# Patient Record
Sex: Female | Born: 1935 | Race: White | Hispanic: No | Marital: Married | Smoking: Never smoker
Health system: Southern US, Community
[De-identification: ages and names within clinical notes are randomized; demographics above are authoritative.]

## PROBLEM LIST (undated history)

## (undated) DIAGNOSIS — G4700 Insomnia, unspecified: Secondary | ICD-10-CM

## (undated) DIAGNOSIS — E782 Mixed hyperlipidemia: Secondary | ICD-10-CM

## (undated) DIAGNOSIS — N184 Chronic kidney disease, stage 4 (severe): Secondary | ICD-10-CM

## (undated) DIAGNOSIS — K219 Gastro-esophageal reflux disease without esophagitis: Secondary | ICD-10-CM

## (undated) DIAGNOSIS — E119 Type 2 diabetes mellitus without complications: Secondary | ICD-10-CM

## (undated) DIAGNOSIS — I4891 Unspecified atrial fibrillation: Secondary | ICD-10-CM

## (undated) HISTORY — DX: Unspecified atrial fibrillation: I48.91

## (undated) HISTORY — PX: ESOPHAGEAL DILATION: SHX303

## (undated) HISTORY — DX: Chronic kidney disease, stage 4 (severe): N18.4

## (undated) HISTORY — DX: Mixed hyperlipidemia: E78.2

## (undated) HISTORY — DX: Insomnia, unspecified: G47.00

## (undated) HISTORY — DX: Gastro-esophageal reflux disease without esophagitis: K21.9

## (undated) HISTORY — DX: Type 2 diabetes mellitus without complications: E11.9

---

## 2001-02-24 ENCOUNTER — Encounter: Admission: RE | Admit: 2001-02-24 | Discharge: 2001-02-24 | Payer: Self-pay | Admitting: Unknown Physician Specialty

## 2001-02-24 ENCOUNTER — Encounter: Payer: Self-pay | Admitting: General Surgery

## 2001-03-24 ENCOUNTER — Encounter: Admission: RE | Admit: 2001-03-24 | Discharge: 2001-03-24 | Payer: Self-pay | Admitting: General Surgery

## 2001-03-24 ENCOUNTER — Encounter: Payer: Self-pay | Admitting: General Surgery

## 2001-03-25 ENCOUNTER — Encounter: Admission: RE | Admit: 2001-03-25 | Discharge: 2001-03-25 | Payer: Self-pay | Admitting: General Surgery

## 2001-03-25 ENCOUNTER — Encounter (INDEPENDENT_AMBULATORY_CARE_PROVIDER_SITE_OTHER): Payer: Self-pay | Admitting: Specialist

## 2001-03-25 ENCOUNTER — Encounter: Payer: Self-pay | Admitting: General Surgery

## 2001-03-25 ENCOUNTER — Ambulatory Visit (HOSPITAL_BASED_OUTPATIENT_CLINIC_OR_DEPARTMENT_OTHER): Admission: RE | Admit: 2001-03-25 | Discharge: 2001-03-25 | Payer: Self-pay | Admitting: General Surgery

## 2001-04-06 ENCOUNTER — Encounter: Payer: Self-pay | Admitting: General Surgery

## 2001-04-06 ENCOUNTER — Encounter: Admission: RE | Admit: 2001-04-06 | Discharge: 2001-04-06 | Payer: Self-pay | Admitting: General Surgery

## 2001-04-13 ENCOUNTER — Encounter: Admission: RE | Admit: 2001-04-13 | Discharge: 2001-07-12 | Payer: Self-pay | Admitting: Family Medicine

## 2002-03-28 ENCOUNTER — Encounter: Admission: RE | Admit: 2002-03-28 | Discharge: 2002-03-28 | Payer: Self-pay | Admitting: Family Medicine

## 2002-03-28 ENCOUNTER — Encounter: Payer: Self-pay | Admitting: Family Medicine

## 2003-06-13 ENCOUNTER — Encounter: Admission: RE | Admit: 2003-06-13 | Discharge: 2003-06-13 | Payer: Self-pay | Admitting: Family Medicine

## 2004-06-24 ENCOUNTER — Encounter: Admission: RE | Admit: 2004-06-24 | Discharge: 2004-06-24 | Payer: Self-pay | Admitting: Family Medicine

## 2005-06-27 ENCOUNTER — Encounter: Admission: RE | Admit: 2005-06-27 | Discharge: 2005-06-27 | Payer: Self-pay | Admitting: Family Medicine

## 2005-07-09 ENCOUNTER — Encounter: Admission: RE | Admit: 2005-07-09 | Discharge: 2005-07-09 | Payer: Self-pay | Admitting: Family Medicine

## 2006-01-28 ENCOUNTER — Encounter: Admission: RE | Admit: 2006-01-28 | Discharge: 2006-01-28 | Payer: Self-pay | Admitting: Gastroenterology

## 2006-02-20 ENCOUNTER — Ambulatory Visit (HOSPITAL_COMMUNITY): Admission: RE | Admit: 2006-02-20 | Discharge: 2006-02-20 | Payer: Self-pay | Admitting: Gastroenterology

## 2006-02-20 ENCOUNTER — Encounter (INDEPENDENT_AMBULATORY_CARE_PROVIDER_SITE_OTHER): Payer: Self-pay | Admitting: *Deleted

## 2006-06-30 ENCOUNTER — Encounter: Admission: RE | Admit: 2006-06-30 | Discharge: 2006-06-30 | Payer: Self-pay | Admitting: Family Medicine

## 2007-07-02 ENCOUNTER — Encounter: Admission: RE | Admit: 2007-07-02 | Discharge: 2007-07-02 | Payer: Self-pay | Admitting: Family Medicine

## 2008-07-03 ENCOUNTER — Encounter: Admission: RE | Admit: 2008-07-03 | Discharge: 2008-07-03 | Payer: Self-pay | Admitting: Family Medicine

## 2009-07-04 ENCOUNTER — Encounter: Admission: RE | Admit: 2009-07-04 | Discharge: 2009-07-04 | Payer: Self-pay | Admitting: Family Medicine

## 2010-06-01 ENCOUNTER — Other Ambulatory Visit: Payer: Self-pay | Admitting: Family Medicine

## 2010-06-01 DIAGNOSIS — Z1239 Encounter for other screening for malignant neoplasm of breast: Secondary | ICD-10-CM

## 2010-07-05 ENCOUNTER — Other Ambulatory Visit: Payer: Self-pay | Admitting: Family Medicine

## 2010-07-05 DIAGNOSIS — R229 Localized swelling, mass and lump, unspecified: Secondary | ICD-10-CM

## 2010-07-08 ENCOUNTER — Ambulatory Visit
Admission: RE | Admit: 2010-07-08 | Discharge: 2010-07-08 | Disposition: A | Discharge status: Home / Self Care | Payer: BC Managed Care – PPO | Source: Ambulatory Visit | Attending: Family Medicine | Admitting: Family Medicine

## 2010-07-08 DIAGNOSIS — Z1239 Encounter for other screening for malignant neoplasm of breast: Secondary | ICD-10-CM

## 2010-07-08 DIAGNOSIS — R229 Localized swelling, mass and lump, unspecified: Secondary | ICD-10-CM

## 2010-08-14 ENCOUNTER — Inpatient Hospital Stay (HOSPITAL_BASED_OUTPATIENT_CLINIC_OR_DEPARTMENT_OTHER)
Admission: RE | Admit: 2010-08-14 | Discharge: 2010-08-14 | Disposition: A | Discharge status: Home / Self Care | Payer: BC Managed Care – PPO | Source: Ambulatory Visit | Attending: Interventional Cardiology | Admitting: Interventional Cardiology

## 2010-08-14 ENCOUNTER — Ambulatory Visit
Admission: RE | Admit: 2010-08-14 | Payer: BC Managed Care – PPO | Source: Ambulatory Visit | Admitting: Interventional Cardiology

## 2010-08-14 DIAGNOSIS — R9439 Abnormal result of other cardiovascular function study: Secondary | ICD-10-CM | POA: Insufficient documentation

## 2010-09-27 NOTE — Op Note (Signed)
NAMEVENERA, Kathryn Richardson                ACCOUNT NO.:  [id number]   MEDICAL RECORD NO.:  [id number]          PATIENT TYPE:  AMB   LOCATION:  ENDO                         FACILITY:  [facility]   PHYSICIAN:  Wonda Horner, M.D.   DATE OF BIRTH:  April 20, 1935   DATE OF PROCEDURE:  02/23/2006  DATE OF DISCHARGE:  02/20/2006                                 OPERATIVE REPORT   PROCEDURE:  Esophagogastroduodenoscopy with biopsy and endoscopic balloon  dilatation of a distal esophageal stricture secondary to Schatzki's ring.   INDICATIONS FOR PROCEDURE:  Dysphagia, abnormal barium swallow showing  stenosis in the distal esophagus which after reviewing looks like there is a  Schatzki's ring.   The patient has been experiencing dysphagia primarily to solids.   Informed consent was obtained after explanation of the risks of bleeding,  infection, and perforation.   PREMEDICATION:  Fentanyl 75 mcg IV, Versed 7 mg IV.   PROCEDURE:  With the patient in the left lateral decubitus position, the  Olympus gastroscope was inserted into the oropharynx and passed into the  esophagus.  It was advanced down the esophagus to the level of the distal  esophagus. At this point there was a stenosis and it looked like this was  due to a Schatzki's ring. Also at the level of the Schatzki's ring and  extending upward just a little bit, there were two linear ulcerations. The  scope was easily passed into the stomach.  The stenosis was not preventing  passage of the scope. There was a hiatal hernia noted. The gastric mucosa  looked somewhat erythematous and biopsies were obtained for histology and to  look for evidence of Helicobacter pylori.  The duodenal bulb and second  portion of the duodenum looked normal. The scope was then brought back into  the stomach.  Retroflexion did not reveal any significant lesions.   The scope was then brought back to the level of the stenosis and an  endoscopic balloon dilator was advanced  down the scope and appropriately  placed at the level of the stenosis.  It was dilated to the 12 mm, then 13.5  mm. It was held in place at each level for 1 minute.  There was a small  amount of heme present in the area after the dilatation. She had a normal-  appearing mid and proximal esophagus.  She tolerated the procedure well  without immediate complications.   IMPRESSION:  1. Distal esophageal stenosis felt secondary to a Schatzki's ring.  2. Two linear ulcers in the distal esophagus at the level of the      Schatzki's ring.  3. Hiatal hernia.  4. Gastritis.   PLAN:  We will begin Prilosec 20 mg p.o. b.i.d.  The patient was instructed  to get this and start taking it. We will observe the response to the  dilatation. If symptoms of dysphagia are ongoing or recur, repeat dilatation  can be done.           ______________________________  Wonda Horner, M.D.     SFG/MEDQ  D:  02/23/2006  T:  02/23/2006  Job:  [id number]   cc:   [name], M.D.

## 2010-09-27 NOTE — Op Note (Signed)
[facility]  Patient:    Kathryn Richardson, Kathryn Richardson Visit Number: [id number] MRN: [id number]          Service Type: DSU Location: [facility] Attending Physician:  Luella Cook Dictated by:   Autumn Messing, M.D. Proc. Date: 03/25/01 Admit Date:  03/25/2001 Discharge Date: 03/25/2001                             Operative Report  PREOPERATIVE DIAGNOSIS:  Intraductal papilloma of the left breast.  POSTOPERATIVE DIAGNOSIS:  Intraductal papilloma of the left breast.  OPERATION PERFORMED:  Excisional biopsy of the left breast papilloma.  SURGEON:  Autumn Messing, M.D.  ANESTHESIA:  General endotracheal.  DESCRIPTION OF PROCEDURE:  After informed consent was obtained, the patient was brought to the operating room and placed in supine position on the operating table.  After adequate induction of general anesthesia, the patients left breast was prepped with Betadine and draped in the usual sterile manner.  Prior to coming to the operating room, a localizing wire had been placed by radiology through the area in question.  Once the breast was prepped and draped, a small curvilinear incision was made along the lateral edge of the nipple areolar complex.  The incision was carried down into the breast tissue using the Bovie electrocautery and dissection was carried out in a manner so as to excise a mass of breast tissue that included the tip of the wire.  Once this was complete and separated from the rest of the breast tissue, the specimen was removed and sent to radiology and pathology for further imaging and evaluation.  The wound was then inspected and several small bleeding points were coagulated until the wound was completely hemostatic.  The skin incision was then closed with running 4-0 Monocryl subcuticular stitch and benzoin and Steri-Strips and sterile dressings were applied.  The patient tolerated the procedure well.  At the end of the case all sponge, needle and  instrument counts were correct.  The patient was awakened and taken to the recovery room in stable condition. Dictated by:   Autumn Messing, M.D. Attending Physician:  Luella Cook DD:  04/12/01 TD:  04/12/01 Job: [id number]

## 2010-10-02 NOTE — Cardiovascular Report (Signed)
  Kathryn Richardson, Kathryn Richardson                ACCOUNT NO.:  [id number]  MEDICAL RECORD NO.:  [id number]           PATIENT TYPE:  O  LOCATION:  CATH                         FACILITY:  [facility]  PHYSICIAN:  Jettie Booze, MDDATE OF BIRTH:  May 23, 1934  DATE OF PROCEDURE:  08/14/2010 DATE OF DISCHARGE:                           CARDIAC CATHETERIZATION   REFERRING PHYSICIAN:  [name], MD  PROCEDURE PERFORMED:  Left heart catheterization, coronary angiogram.  OPERATOR:  Jettie Booze, MD  INDICATIONS:  Angina with abnormal stress test.  PROCEDURE NARRATIVE:  The risks and benefits of cardiac catheterization were explained to the patient.  Informed consent was obtained.  She was brought to the cath lab.  She was prepped and draped in usual sterile fashion.  Her right groin was infiltrated with 1% lidocaine.  A 4-French sheath was placed into the right common femoral artery using modified Seldinger technique.  The left coronary artery angiography was performed using a JL-4 pigtail catheter.  The catheter was advanced to vessel ostium under fluoroscopic guidance.  Digital angiography was performed in multiple projections using hand injection of contrast.  Right coronary artery angiography was performed in a similar fashion with a JR- 4 catheter.  A pigtail catheter was advanced to the ascending aorta and across the aortic valve under fluoroscopic guidance.  Hemodynamic pressure assessment was done.  A pullback while hemodynamics were measured was performed.  The sheath was removed using manual compression.  The patient was hydrated with IV bicarb because of chronic renal insufficiency.  Ventriculography was not performed to minimize the amount of dye.  The patient received 25 mL of contrast during the procedure.  FINDINGS:  Left main tract was widely patent. Left circumflex is a medium-sized vessel.  OM-1 was medium-sized.  The entire circumflex system was angiographically  normal. The ramus vessel was medium-sized and widely patent. Left anterior descending was a large vessel to the apex.  The D1 was a medium-sized vessel, widely patent.  The entire LAD system was angiographically normal. The right coronary artery was a large dominant vessel.  The posterolateral artery was a large vessel which supplied much of the lateral wall.  The posterior descending artery is a medium-sized vessel. The entire RCA system was angiographically normal.  HEMODYNAMICS:  Ventricular pressure 136/12 and LVEDP of 13 mmHg.  Aortic pressure 130/46 with a mean aortic pressure of 77 mmHg.  IMPRESSION: 1. No significant coronary artery disease. 2. No aortic valve gradient. 3. Normal left ventricular end diastolic pressure.  RECOMMENDATIONS:  Continue aggressive medical therapy.  Continue management of atrial fibrillation and preventive therapy.     Jettie Booze, MD     JSV/MEDQ  D:  08/14/2010  T:  08/14/2010  Job:  [id number]  Electronically Signed by Larae Grooms MD on 08/28/2010 02:22:14 PM

## 2011-02-09 DIAGNOSIS — E119 Type 2 diabetes mellitus without complications: Secondary | ICD-10-CM | POA: Insufficient documentation

## 2011-02-09 DIAGNOSIS — J309 Allergic rhinitis, unspecified: Secondary | ICD-10-CM | POA: Insufficient documentation

## 2011-02-09 DIAGNOSIS — E559 Vitamin D deficiency, unspecified: Secondary | ICD-10-CM | POA: Insufficient documentation

## 2011-05-15 ENCOUNTER — Other Ambulatory Visit: Payer: Self-pay | Admitting: Internal Medicine

## 2011-05-15 DIAGNOSIS — M109 Gout, unspecified: Secondary | ICD-10-CM | POA: Diagnosis not present

## 2011-05-15 DIAGNOSIS — Z79899 Other long term (current) drug therapy: Secondary | ICD-10-CM | POA: Diagnosis not present

## 2011-05-15 DIAGNOSIS — N184 Chronic kidney disease, stage 4 (severe): Secondary | ICD-10-CM | POA: Diagnosis not present

## 2011-05-15 DIAGNOSIS — M1990 Unspecified osteoarthritis, unspecified site: Secondary | ICD-10-CM | POA: Diagnosis not present

## 2011-05-21 DIAGNOSIS — Z7901 Long term (current) use of anticoagulants: Secondary | ICD-10-CM | POA: Diagnosis not present

## 2011-05-21 DIAGNOSIS — I4891 Unspecified atrial fibrillation: Secondary | ICD-10-CM | POA: Diagnosis not present

## 2011-05-26 DIAGNOSIS — N184 Chronic kidney disease, stage 4 (severe): Secondary | ICD-10-CM | POA: Diagnosis not present

## 2011-05-26 DIAGNOSIS — E559 Vitamin D deficiency, unspecified: Secondary | ICD-10-CM | POA: Diagnosis not present

## 2011-05-26 DIAGNOSIS — I10 Essential (primary) hypertension: Secondary | ICD-10-CM | POA: Diagnosis not present

## 2011-05-26 DIAGNOSIS — Z79899 Other long term (current) drug therapy: Secondary | ICD-10-CM | POA: Diagnosis not present

## 2011-05-26 DIAGNOSIS — I129 Hypertensive chronic kidney disease with stage 1 through stage 4 chronic kidney disease, or unspecified chronic kidney disease: Secondary | ICD-10-CM | POA: Diagnosis not present

## 2011-06-04 ENCOUNTER — Other Ambulatory Visit: Payer: Self-pay | Admitting: Family Medicine

## 2011-06-04 DIAGNOSIS — Z1231 Encounter for screening mammogram for malignant neoplasm of breast: Secondary | ICD-10-CM

## 2011-06-04 DIAGNOSIS — N189 Chronic kidney disease, unspecified: Secondary | ICD-10-CM | POA: Diagnosis not present

## 2011-06-11 DIAGNOSIS — E119 Type 2 diabetes mellitus without complications: Secondary | ICD-10-CM | POA: Diagnosis not present

## 2011-06-18 DIAGNOSIS — I4891 Unspecified atrial fibrillation: Secondary | ICD-10-CM | POA: Diagnosis not present

## 2011-06-18 DIAGNOSIS — Z7901 Long term (current) use of anticoagulants: Secondary | ICD-10-CM | POA: Diagnosis not present

## 2011-07-10 ENCOUNTER — Ambulatory Visit
Admission: RE | Admit: 2011-07-10 | Discharge: 2011-07-10 | Disposition: A | Discharge status: Home / Self Care | Payer: BC Managed Care – PPO | Source: Ambulatory Visit | Attending: Family Medicine | Admitting: Family Medicine

## 2011-07-10 DIAGNOSIS — Z1231 Encounter for screening mammogram for malignant neoplasm of breast: Secondary | ICD-10-CM

## 2011-07-15 DIAGNOSIS — Z7901 Long term (current) use of anticoagulants: Secondary | ICD-10-CM | POA: Diagnosis not present

## 2011-07-15 DIAGNOSIS — I4891 Unspecified atrial fibrillation: Secondary | ICD-10-CM | POA: Diagnosis not present

## 2011-08-12 DIAGNOSIS — Z7901 Long term (current) use of anticoagulants: Secondary | ICD-10-CM | POA: Diagnosis not present

## 2011-08-12 DIAGNOSIS — I4891 Unspecified atrial fibrillation: Secondary | ICD-10-CM | POA: Diagnosis not present

## 2011-09-03 ENCOUNTER — Encounter: Payer: Self-pay | Admitting: Internal Medicine

## 2011-09-03 ENCOUNTER — Ambulatory Visit (INDEPENDENT_AMBULATORY_CARE_PROVIDER_SITE_OTHER): Payer: BC Managed Care – PPO | Admitting: Internal Medicine

## 2011-09-03 VITALS — BP 142/58 | HR 51 | Resp 19 | Ht 63.0 in | Wt 133.0 lb

## 2011-09-03 DIAGNOSIS — I4891 Unspecified atrial fibrillation: Secondary | ICD-10-CM | POA: Insufficient documentation

## 2011-09-03 DIAGNOSIS — R001 Bradycardia, unspecified: Secondary | ICD-10-CM | POA: Insufficient documentation

## 2011-09-03 DIAGNOSIS — I498 Other specified cardiac arrhythmias: Secondary | ICD-10-CM

## 2011-09-03 NOTE — Assessment & Plan Note (Signed)
She has paroxysmal atrial fibrillation well controlled with flecainide.  Given renal failure and bradycardia, she really does not have other antiarrhythmic options presently.  I will try to wean her off of flecainide, possibly using this as a pill in pocket medicine.  If her afib worsens, then I would anticipate proceeding with PPM which would open up our antiarrhythmic options further.  She is not presently interested in catheter ablation and understands that 50cc of contrast required would potentially worsen her renal failure. She is appropriately anticoagulated with coumadin.

## 2011-09-03 NOTE — Patient Instructions (Signed)
Your physician recommends that you schedule a follow-up appointment in: 5 weeks with Dr Rayann Heman  Your physician has recommended you make the following change in your medication:  1) Decrease Flecainide to 50mg  twice daily for 1 week then stop

## 2011-09-03 NOTE — Assessment & Plan Note (Signed)
The patient presents today for EP consultation regarding symptomatic bradycardia.  It is possible that her bradycardia is related to flecainide.  As her afib has been well controlled, I would like to see how she does off of flecainide.  If her afib returns or she does not have significant improvement in heart rates, then our next option would likely be pacemaker implantation.  Given her chronic stage IV renal failure, I would like to avoid PPM if possible, though I suspect that implantation is inevitable.

## 2011-09-03 NOTE — Progress Notes (Signed)
Primary Care Physician: Gara Kroner, MD, MD Referring Physician:  Dr Saunders Kathryn Richardson is a 77 y.o. female with a h/o paroxysmal atrial fibrillation and bradycardia who presents today for EP consultation.   She was diagnosed with atrial fibrillation 4/10 after presenting with tachypalpitations.  In retrospect, she feels that she had afib for several months prior.  She was place on coumadin at that time.  She had increased afib and was placed on flecainide 2011.  She feels that flecainide has controlled her afib.  Her last episode was 02/2010.  She has done well with flecainide but has noticed bradycardia with this.  She feels that she has had bradycardia since 2011.  She attributes this to flecainide. She reports that her heart rates are always 40s, but recently 30s at times.  When her heart rate is slow, she reports feeling "worn out" and frequently "dizzy".  She describes this dizziness as waves of lightheadedness.  This occurs when walking at times.  She has associated fatigue.  She is short of breath with moderate activity.  She has been demonstrated to have a blunted heart rate response to activity on treadmill previously.  Today, she denies symptoms of palpitations, chest pain, shortness of breath, orthopnea, PND, lower extremity edema, dizziness, presyncope, syncope, or neurologic sequela. The patient is tolerating medications without difficulties and is otherwise without complaint today.   Past Medical History  Diagnosis Date  . Essential hypertension   . Gout   . GERD (gastroesophageal reflux disease)   . Insomnia   . Atrial fibrillation   . Type 2 diabetes mellitus   . Allergic rhinitis   . Hyperlipemia, mixed   . Chronic renal disease, stage IV    Past Surgical History  Procedure Date  . Cesarean section     x2  . Esophageal dilation     Current Outpatient Prescriptions  Medication Sig Dispense Refill  . amLODipine (NORVASC) 10 MG tablet Take 10 mg by mouth daily.       Marland Kitchen aspirin 81 MG tablet Take 81 mg by mouth daily.      Marland Kitchen atorvastatin (LIPITOR) 80 MG tablet Take 80 mg by mouth daily.      . benazepril (LOTENSIN) 20 MG tablet Take 20 mg by mouth daily.      . febuxostat (ULORIC) 40 MG tablet Take 40 mg by mouth daily.      . flecainide (TAMBOCOR) 100 MG tablet Take 100 mg by mouth 2 (two) times daily.      . furosemide (LASIX) 40 MG tablet Take 40 mg by mouth daily.      Marland Kitchen levothyroxine (SYNTHROID, LEVOTHROID) 50 MCG tablet Take 50 mcg by mouth daily. On a empty stomach.      . Potassium Chloride Crys CR (KLOR-CON M20 PO) Take 20 mEq by mouth daily.      . sitaGLIPtin (JANUVIA) 100 MG tablet Take 100 mg by mouth daily.      . Vitamin D, Ergocalciferol, (DRISDOL) 50000 UNITS CAPS Take 50,000 Units by mouth.      . warfarin (COUMADIN) 5 MG tablet Take 5 mg by mouth daily. 2.5 mg by mouth daily;except 5 mg on MWF.        Allergies  Allergen Reactions  . Prednisone     Increase blood sugar.    History   Social History  . Marital Status: Married    Spouse Name: N/A    Number of Children: N/A  . Years of  Education: N/A   Occupational History  . Not on file.   Social History Main Topics  . Smoking status: Never Smoker   . Smokeless tobacco: Not on file  . Alcohol Use: No  . Drug Use: No  . Sexually Active: Not on file   Other Topics Concern  . Not on file   Social History Narrative   Lives in [place].  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 Coronary artery disease Father 32    ROS- All systems are reviewed and negative except as per the HPI above  Physical Exam: Filed Vitals:   09/03/11 1415  BP: 142/58  Pulse: 51  Resp: 19  Height: 5\' 3"  (1.6 m)  Weight: 133 lb (60.328 kg)    GEN- The patient is well appearing, alert and oriented x 3 today.   Head- normocephalic, atraumatic Eyes-  Sclera clear, conjunctiva pink Ears- hearing intact Oropharynx-  clear Neck- supple, no JVP Lymph- no cervical lymphadenopathy Lungs- Clear to ausculation bilaterally, normal work of breathing Heart- Regular rate and rhythm, no murmurs, rubs or gallops, PMI not laterally displaced GI- soft, NT, ND, + BS Extremities- no clubbing, cyanosis, or edema MS- no significant deformity or atrophy Skin- no rash or lesion Psych- euthymic mood, full affect Neuro- strength and sensation are intact  EKG today reveals sinus bradycardia 51 bpm, PR 254, IVCD (QRS 122), Qtc 457  Assessment and Plan:

## 2011-09-04 ENCOUNTER — Encounter: Payer: Self-pay | Admitting: Internal Medicine

## 2011-09-08 DIAGNOSIS — R809 Proteinuria, unspecified: Secondary | ICD-10-CM | POA: Diagnosis not present

## 2011-09-08 DIAGNOSIS — E119 Type 2 diabetes mellitus without complications: Secondary | ICD-10-CM | POA: Diagnosis not present

## 2011-09-08 DIAGNOSIS — M109 Gout, unspecified: Secondary | ICD-10-CM | POA: Diagnosis not present

## 2011-09-08 DIAGNOSIS — D631 Anemia in chronic kidney disease: Secondary | ICD-10-CM | POA: Diagnosis not present

## 2011-09-08 DIAGNOSIS — E559 Vitamin D deficiency, unspecified: Secondary | ICD-10-CM | POA: Diagnosis not present

## 2011-09-08 DIAGNOSIS — N184 Chronic kidney disease, stage 4 (severe): Secondary | ICD-10-CM | POA: Diagnosis not present

## 2011-09-08 DIAGNOSIS — I129 Hypertensive chronic kidney disease with stage 1 through stage 4 chronic kidney disease, or unspecified chronic kidney disease: Secondary | ICD-10-CM | POA: Diagnosis not present

## 2011-09-17 DIAGNOSIS — I4891 Unspecified atrial fibrillation: Secondary | ICD-10-CM | POA: Diagnosis not present

## 2011-09-17 DIAGNOSIS — Z7901 Long term (current) use of anticoagulants: Secondary | ICD-10-CM | POA: Diagnosis not present

## 2011-10-13 ENCOUNTER — Encounter: Payer: Self-pay | Admitting: Internal Medicine

## 2011-10-13 ENCOUNTER — Other Ambulatory Visit: Payer: Self-pay | Admitting: Cardiology

## 2011-10-13 ENCOUNTER — Ambulatory Visit (INDEPENDENT_AMBULATORY_CARE_PROVIDER_SITE_OTHER): Payer: BC Managed Care – PPO | Admitting: Internal Medicine

## 2011-10-13 VITALS — BP 114/66 | HR 63 | Ht 65.0 in | Wt 176.8 lb

## 2011-10-13 DIAGNOSIS — I498 Other specified cardiac arrhythmias: Secondary | ICD-10-CM | POA: Diagnosis not present

## 2011-10-13 DIAGNOSIS — R001 Bradycardia, unspecified: Secondary | ICD-10-CM

## 2011-10-13 DIAGNOSIS — I4891 Unspecified atrial fibrillation: Secondary | ICD-10-CM | POA: Diagnosis not present

## 2011-10-13 NOTE — Patient Instructions (Signed)
Your physician recommends that you schedule a follow-up appointment as needed  

## 2011-10-16 ENCOUNTER — Encounter: Payer: Self-pay | Admitting: Internal Medicine

## 2011-10-16 NOTE — Assessment & Plan Note (Signed)
Presently doing well on flecainide 50mg  BID.  She did not tolerate lower doses.  Presently, she is maintaining sinus rhythm and feels no significant symptoms with bradycardia. I therefore think that we should continue this current strategy long term.  Given renal failure and bradycardia, she really does not have other antiarrhythmic options presently.   If her afib worsens, then I would anticipate proceeding with PPM which would open up our antiarrhythmic options further.  She is not presently interested in catheter ablation and understands that 50cc of contrast required would potentially worsen her renal failure.   She is appropriately anticoagulated with coumadin.  At this point, I will return his care to Dr Irish Lack.  If she has further problems then I am happy to see her again at that time.

## 2011-10-16 NOTE — Assessment & Plan Note (Signed)
Presently stable and minimally symptomatic As she has significant renal failure and may eventually require HD, we should avoid PPM if able.

## 2011-10-16 NOTE — Progress Notes (Signed)
Primary Care Physician: Gara Kroner, MD, MD Referring Physician:  Dr Saunders Revel is a 77 y.o. female with a h/o paroxysmal atrial fibrillation and bradycardia who presents today for EP follow-up.  She has done reasonably well since I saw her last.  She initially stopped flecainide as per my recommendation but had recurrent afib.  She therefore restarted a lower dose of flecainide as per Dr Hassell Done recommendation.  She has done very well since that time.  She feels that her energy is stable.  She presently denies dizziness or symptoms of bradycardia.  She feels that her afib is controlled.  Today, she denies symptoms of palpitations, chest pain, shortness of breath, orthopnea, PND, lower extremity edema, presyncope, syncope, or neurologic sequela. The patient is tolerating medications without difficulties and is otherwise without complaint today.   Past Medical History  Diagnosis Date  . Essential hypertension   . Gout   . GERD (gastroesophageal reflux disease)   . Insomnia   . Atrial fibrillation   . Type 2 diabetes mellitus   . Allergic rhinitis   . Hyperlipemia, mixed   . Chronic renal disease, stage IV    Past Surgical History  Procedure Date  . Cesarean section     x2  . Esophageal dilation     Current Outpatient Prescriptions  Medication Sig Dispense Refill  . amLODipine (NORVASC) 10 MG tablet Take 10 mg by mouth daily.      Marland Kitchen aspirin 81 MG tablet Take 81 mg by mouth daily.      Marland Kitchen atorvastatin (LIPITOR) 80 MG tablet Take 80 mg by mouth daily.      . benazepril (LOTENSIN) 20 MG tablet Take 20 mg by mouth daily.      . febuxostat (ULORIC) 40 MG tablet Take 40 mg by mouth daily.      . flecainide (TAMBOCOR) 50 MG tablet Take 50 mg by mouth 2 (two) times daily.      . furosemide (LASIX) 40 MG tablet Take 40 mg by mouth daily.      Marland Kitchen levothyroxine (SYNTHROID, LEVOTHROID) 50 MCG tablet Take 50 mcg by mouth daily. On a empty stomach.      . Potassium Chloride  Crys CR (KLOR-CON M20 PO) Take 20 mEq by mouth daily.      . sitaGLIPtin (JANUVIA) 100 MG tablet Take 100 mg by mouth daily.      Marland Kitchen warfarin (COUMADIN) 5 MG tablet Take 5 mg by mouth daily. 2.5 mg by mouth daily;except 5 mg on MWF.        Allergies  Allergen Reactions  . Prednisone     Increase blood sugar.    History   Social History  . Marital Status: Married    Spouse Name: N/A    Number of Children: N/A  . Years of Education: N/A   Occupational History  . Not on file.   Social History Main Topics  . Smoking status: Never Smoker   . Smokeless tobacco: Not on file  . Alcohol Use: No  . Drug Use: No  . Sexually Active: Not on file   Other Topics Concern  . Not on file   Social History Narrative   Lives in [place].  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 Coronary artery disease Father 75   Physical Exam: Filed Vitals:   10/13/11 1510  BP: 114/66  Pulse: 63  Height:  5\' 5"  (1.651 m)  Weight: 176 lb 12.8 oz (80.196 kg)    GEN- The patient is well appearing, alert and oriented x 3 today.   Head- normocephalic, atraumatic Eyes-  Sclera clear, conjunctiva pink Ears- hearing intact Oropharynx- clear Neck- supple, no JVP Lymph- no cervical lymphadenopathy Lungs- Clear to ausculation bilaterally, normal work of breathing Heart- Regular rate and rhythm, no murmurs, rubs or gallops, PMI not laterally displaced GI- soft, NT, ND, + BS Extremities- no clubbing, cyanosis, or edema   EKG today reveals sinus rhythm 63 bpm, PR 190, QRS 82 msec, Qtc 423 Event monitor 09/04/11-09/25/11 is reviewed in detail today,  This reveals sinus bradycardia with PACs and PVCs   Assessment and Plan:

## 2011-10-21 DIAGNOSIS — I4891 Unspecified atrial fibrillation: Secondary | ICD-10-CM | POA: Diagnosis not present

## 2011-10-21 DIAGNOSIS — Z7901 Long term (current) use of anticoagulants: Secondary | ICD-10-CM | POA: Diagnosis not present

## 2011-10-28 DIAGNOSIS — E119 Type 2 diabetes mellitus without complications: Secondary | ICD-10-CM | POA: Diagnosis not present

## 2011-10-28 DIAGNOSIS — R609 Edema, unspecified: Secondary | ICD-10-CM | POA: Diagnosis not present

## 2011-10-28 DIAGNOSIS — M109 Gout, unspecified: Secondary | ICD-10-CM | POA: Diagnosis not present

## 2011-10-28 DIAGNOSIS — E559 Vitamin D deficiency, unspecified: Secondary | ICD-10-CM | POA: Diagnosis not present

## 2011-10-28 DIAGNOSIS — I10 Essential (primary) hypertension: Secondary | ICD-10-CM | POA: Diagnosis not present

## 2011-10-28 DIAGNOSIS — E039 Hypothyroidism, unspecified: Secondary | ICD-10-CM | POA: Diagnosis not present

## 2011-10-28 DIAGNOSIS — N184 Chronic kidney disease, stage 4 (severe): Secondary | ICD-10-CM | POA: Diagnosis not present

## 2011-10-28 DIAGNOSIS — E782 Mixed hyperlipidemia: Secondary | ICD-10-CM | POA: Diagnosis not present

## 2011-12-09 DIAGNOSIS — I4891 Unspecified atrial fibrillation: Secondary | ICD-10-CM | POA: Diagnosis not present

## 2011-12-09 DIAGNOSIS — Z7901 Long term (current) use of anticoagulants: Secondary | ICD-10-CM | POA: Diagnosis not present

## 2011-12-23 DIAGNOSIS — I4891 Unspecified atrial fibrillation: Secondary | ICD-10-CM | POA: Diagnosis not present

## 2011-12-23 DIAGNOSIS — Z7901 Long term (current) use of anticoagulants: Secondary | ICD-10-CM | POA: Diagnosis not present

## 2011-12-31 DIAGNOSIS — I10 Essential (primary) hypertension: Secondary | ICD-10-CM | POA: Diagnosis not present

## 2011-12-31 DIAGNOSIS — I129 Hypertensive chronic kidney disease with stage 1 through stage 4 chronic kidney disease, or unspecified chronic kidney disease: Secondary | ICD-10-CM | POA: Diagnosis not present

## 2011-12-31 DIAGNOSIS — M549 Dorsalgia, unspecified: Secondary | ICD-10-CM | POA: Diagnosis not present

## 2011-12-31 DIAGNOSIS — N184 Chronic kidney disease, stage 4 (severe): Secondary | ICD-10-CM | POA: Diagnosis not present

## 2011-12-31 DIAGNOSIS — I4891 Unspecified atrial fibrillation: Secondary | ICD-10-CM | POA: Diagnosis not present

## 2011-12-31 DIAGNOSIS — G8929 Other chronic pain: Secondary | ICD-10-CM | POA: Diagnosis not present

## 2012-01-29 DIAGNOSIS — Z7901 Long term (current) use of anticoagulants: Secondary | ICD-10-CM | POA: Diagnosis not present

## 2012-01-29 DIAGNOSIS — I4891 Unspecified atrial fibrillation: Secondary | ICD-10-CM | POA: Diagnosis not present

## 2012-02-16 DIAGNOSIS — J019 Acute sinusitis, unspecified: Secondary | ICD-10-CM | POA: Diagnosis not present

## 2012-02-25 ENCOUNTER — Other Ambulatory Visit: Payer: Self-pay | Admitting: Family Medicine

## 2012-02-25 DIAGNOSIS — M79604 Pain in right leg: Secondary | ICD-10-CM

## 2012-02-25 DIAGNOSIS — Z23 Encounter for immunization: Secondary | ICD-10-CM | POA: Diagnosis not present

## 2012-02-25 DIAGNOSIS — M25579 Pain in unspecified ankle and joints of unspecified foot: Secondary | ICD-10-CM | POA: Diagnosis not present

## 2012-02-26 ENCOUNTER — Ambulatory Visit
Admission: RE | Admit: 2012-02-26 | Discharge: 2012-02-26 | Disposition: A | Discharge status: Home / Self Care | Payer: BC Managed Care – PPO | Source: Ambulatory Visit | Attending: Family Medicine | Admitting: Family Medicine

## 2012-02-26 ENCOUNTER — Other Ambulatory Visit: Payer: Self-pay | Admitting: Family Medicine

## 2012-02-26 DIAGNOSIS — M7989 Other specified soft tissue disorders: Secondary | ICD-10-CM | POA: Diagnosis not present

## 2012-02-26 DIAGNOSIS — M79604 Pain in right leg: Secondary | ICD-10-CM

## 2012-02-26 DIAGNOSIS — M79609 Pain in unspecified limb: Secondary | ICD-10-CM | POA: Diagnosis not present

## 2012-03-01 DIAGNOSIS — M109 Gout, unspecified: Secondary | ICD-10-CM | POA: Diagnosis not present

## 2012-03-01 DIAGNOSIS — Z79899 Other long term (current) drug therapy: Secondary | ICD-10-CM | POA: Diagnosis not present

## 2012-03-01 DIAGNOSIS — M25569 Pain in unspecified knee: Secondary | ICD-10-CM | POA: Diagnosis not present

## 2012-03-23 DIAGNOSIS — I4891 Unspecified atrial fibrillation: Secondary | ICD-10-CM | POA: Diagnosis not present

## 2012-03-23 DIAGNOSIS — I10 Essential (primary) hypertension: Secondary | ICD-10-CM | POA: Diagnosis not present

## 2012-03-29 DIAGNOSIS — I4891 Unspecified atrial fibrillation: Secondary | ICD-10-CM | POA: Diagnosis not present

## 2012-03-29 DIAGNOSIS — Z7901 Long term (current) use of anticoagulants: Secondary | ICD-10-CM | POA: Diagnosis not present

## 2012-04-26 DIAGNOSIS — Z7901 Long term (current) use of anticoagulants: Secondary | ICD-10-CM | POA: Diagnosis not present

## 2012-04-26 DIAGNOSIS — I4891 Unspecified atrial fibrillation: Secondary | ICD-10-CM | POA: Diagnosis not present

## 2012-05-24 DIAGNOSIS — I4891 Unspecified atrial fibrillation: Secondary | ICD-10-CM | POA: Diagnosis not present

## 2012-05-24 DIAGNOSIS — Z7901 Long term (current) use of anticoagulants: Secondary | ICD-10-CM | POA: Diagnosis not present

## 2012-06-01 DIAGNOSIS — N184 Chronic kidney disease, stage 4 (severe): Secondary | ICD-10-CM | POA: Diagnosis not present

## 2012-06-01 DIAGNOSIS — E559 Vitamin D deficiency, unspecified: Secondary | ICD-10-CM | POA: Diagnosis not present

## 2012-06-01 DIAGNOSIS — I10 Essential (primary) hypertension: Secondary | ICD-10-CM | POA: Diagnosis not present

## 2012-06-01 DIAGNOSIS — E119 Type 2 diabetes mellitus without complications: Secondary | ICD-10-CM | POA: Diagnosis not present

## 2012-06-01 DIAGNOSIS — E039 Hypothyroidism, unspecified: Secondary | ICD-10-CM | POA: Diagnosis not present

## 2012-06-01 DIAGNOSIS — M109 Gout, unspecified: Secondary | ICD-10-CM | POA: Diagnosis not present

## 2012-06-01 DIAGNOSIS — R609 Edema, unspecified: Secondary | ICD-10-CM | POA: Diagnosis not present

## 2012-06-01 DIAGNOSIS — E782 Mixed hyperlipidemia: Secondary | ICD-10-CM | POA: Diagnosis not present

## 2012-06-21 DIAGNOSIS — I4891 Unspecified atrial fibrillation: Secondary | ICD-10-CM | POA: Diagnosis not present

## 2012-06-21 DIAGNOSIS — Z7901 Long term (current) use of anticoagulants: Secondary | ICD-10-CM | POA: Diagnosis not present

## 2012-06-22 ENCOUNTER — Other Ambulatory Visit: Payer: Self-pay | Admitting: Family Medicine

## 2012-06-22 DIAGNOSIS — Z9889 Other specified postprocedural states: Secondary | ICD-10-CM

## 2012-06-22 DIAGNOSIS — Z1231 Encounter for screening mammogram for malignant neoplasm of breast: Secondary | ICD-10-CM

## 2012-07-20 DIAGNOSIS — I4891 Unspecified atrial fibrillation: Secondary | ICD-10-CM | POA: Diagnosis not present

## 2012-07-20 DIAGNOSIS — Z7901 Long term (current) use of anticoagulants: Secondary | ICD-10-CM | POA: Diagnosis not present

## 2012-07-22 ENCOUNTER — Ambulatory Visit
Admission: RE | Admit: 2012-07-22 | Discharge: 2012-07-22 | Disposition: A | Discharge status: Home / Self Care | Payer: BC Managed Care – PPO | Source: Ambulatory Visit | Attending: Family Medicine | Admitting: Family Medicine

## 2012-07-22 DIAGNOSIS — Z1231 Encounter for screening mammogram for malignant neoplasm of breast: Secondary | ICD-10-CM

## 2012-07-22 DIAGNOSIS — Z9889 Other specified postprocedural states: Secondary | ICD-10-CM

## 2012-08-19 DIAGNOSIS — N039 Chronic nephritic syndrome with unspecified morphologic changes: Secondary | ICD-10-CM | POA: Diagnosis not present

## 2012-08-19 DIAGNOSIS — Z7901 Long term (current) use of anticoagulants: Secondary | ICD-10-CM | POA: Diagnosis not present

## 2012-08-19 DIAGNOSIS — Z7982 Long term (current) use of aspirin: Secondary | ICD-10-CM | POA: Diagnosis not present

## 2012-08-19 DIAGNOSIS — D631 Anemia in chronic kidney disease: Secondary | ICD-10-CM | POA: Diagnosis not present

## 2012-08-19 DIAGNOSIS — Z79899 Other long term (current) drug therapy: Secondary | ICD-10-CM | POA: Diagnosis not present

## 2012-08-19 DIAGNOSIS — E119 Type 2 diabetes mellitus without complications: Secondary | ICD-10-CM | POA: Diagnosis not present

## 2012-08-19 DIAGNOSIS — I129 Hypertensive chronic kidney disease with stage 1 through stage 4 chronic kidney disease, or unspecified chronic kidney disease: Secondary | ICD-10-CM | POA: Diagnosis not present

## 2012-08-19 DIAGNOSIS — N184 Chronic kidney disease, stage 4 (severe): Secondary | ICD-10-CM | POA: Diagnosis not present

## 2012-08-24 DIAGNOSIS — Z7901 Long term (current) use of anticoagulants: Secondary | ICD-10-CM | POA: Diagnosis not present

## 2012-08-24 DIAGNOSIS — I4891 Unspecified atrial fibrillation: Secondary | ICD-10-CM | POA: Diagnosis not present

## 2012-08-30 DIAGNOSIS — Z79899 Other long term (current) drug therapy: Secondary | ICD-10-CM | POA: Diagnosis not present

## 2012-08-30 DIAGNOSIS — M109 Gout, unspecified: Secondary | ICD-10-CM | POA: Diagnosis not present

## 2012-09-21 DIAGNOSIS — E782 Mixed hyperlipidemia: Secondary | ICD-10-CM | POA: Diagnosis not present

## 2012-09-21 DIAGNOSIS — I4891 Unspecified atrial fibrillation: Secondary | ICD-10-CM | POA: Diagnosis not present

## 2012-09-21 DIAGNOSIS — Z7901 Long term (current) use of anticoagulants: Secondary | ICD-10-CM | POA: Diagnosis not present

## 2012-09-21 DIAGNOSIS — I10 Essential (primary) hypertension: Secondary | ICD-10-CM | POA: Diagnosis not present

## 2012-11-09 DIAGNOSIS — I4891 Unspecified atrial fibrillation: Secondary | ICD-10-CM | POA: Diagnosis not present

## 2012-11-09 DIAGNOSIS — Z7901 Long term (current) use of anticoagulants: Secondary | ICD-10-CM | POA: Diagnosis not present

## 2012-12-21 DIAGNOSIS — Z7901 Long term (current) use of anticoagulants: Secondary | ICD-10-CM | POA: Diagnosis not present

## 2012-12-21 DIAGNOSIS — I4891 Unspecified atrial fibrillation: Secondary | ICD-10-CM | POA: Diagnosis not present

## 2013-01-26 DIAGNOSIS — Z7901 Long term (current) use of anticoagulants: Secondary | ICD-10-CM | POA: Diagnosis not present

## 2013-01-26 DIAGNOSIS — I4891 Unspecified atrial fibrillation: Secondary | ICD-10-CM | POA: Diagnosis not present

## 2013-02-20 ENCOUNTER — Other Ambulatory Visit: Payer: Self-pay | Admitting: Interventional Cardiology

## 2013-02-21 DIAGNOSIS — N184 Chronic kidney disease, stage 4 (severe): Secondary | ICD-10-CM | POA: Diagnosis not present

## 2013-02-21 DIAGNOSIS — I129 Hypertensive chronic kidney disease with stage 1 through stage 4 chronic kidney disease, or unspecified chronic kidney disease: Secondary | ICD-10-CM | POA: Diagnosis not present

## 2013-02-21 DIAGNOSIS — E119 Type 2 diabetes mellitus without complications: Secondary | ICD-10-CM | POA: Diagnosis not present

## 2013-02-21 DIAGNOSIS — Z23 Encounter for immunization: Secondary | ICD-10-CM | POA: Diagnosis not present

## 2013-02-21 DIAGNOSIS — G4700 Insomnia, unspecified: Secondary | ICD-10-CM | POA: Diagnosis not present

## 2013-02-24 ENCOUNTER — Ambulatory Visit (INDEPENDENT_AMBULATORY_CARE_PROVIDER_SITE_OTHER): Payer: Medicare Other | Admitting: Pharmacist

## 2013-02-24 DIAGNOSIS — I4891 Unspecified atrial fibrillation: Secondary | ICD-10-CM | POA: Diagnosis not present

## 2013-02-24 LAB — POCT INR: INR: 2.8

## 2013-03-14 DIAGNOSIS — E039 Hypothyroidism, unspecified: Secondary | ICD-10-CM | POA: Diagnosis not present

## 2013-03-14 DIAGNOSIS — E785 Hyperlipidemia, unspecified: Secondary | ICD-10-CM | POA: Diagnosis not present

## 2013-03-14 DIAGNOSIS — E559 Vitamin D deficiency, unspecified: Secondary | ICD-10-CM | POA: Diagnosis not present

## 2013-03-14 DIAGNOSIS — M109 Gout, unspecified: Secondary | ICD-10-CM | POA: Diagnosis not present

## 2013-03-14 DIAGNOSIS — N184 Chronic kidney disease, stage 4 (severe): Secondary | ICD-10-CM | POA: Diagnosis not present

## 2013-03-14 DIAGNOSIS — E1129 Type 2 diabetes mellitus with other diabetic kidney complication: Secondary | ICD-10-CM | POA: Diagnosis not present

## 2013-03-14 DIAGNOSIS — R609 Edema, unspecified: Secondary | ICD-10-CM | POA: Diagnosis not present

## 2013-03-14 DIAGNOSIS — I129 Hypertensive chronic kidney disease with stage 1 through stage 4 chronic kidney disease, or unspecified chronic kidney disease: Secondary | ICD-10-CM | POA: Diagnosis not present

## 2013-03-25 ENCOUNTER — Encounter: Payer: Self-pay | Admitting: Interventional Cardiology

## 2013-03-25 ENCOUNTER — Ambulatory Visit (INDEPENDENT_AMBULATORY_CARE_PROVIDER_SITE_OTHER): Payer: Medicare Other | Admitting: Interventional Cardiology

## 2013-03-25 ENCOUNTER — Ambulatory Visit (INDEPENDENT_AMBULATORY_CARE_PROVIDER_SITE_OTHER): Payer: Medicare Other | Admitting: *Deleted

## 2013-03-25 VITALS — BP 140/72 | HR 64 | Ht 61.0 in | Wt 171.0 lb

## 2013-03-25 DIAGNOSIS — I10 Essential (primary) hypertension: Secondary | ICD-10-CM

## 2013-03-25 DIAGNOSIS — I4891 Unspecified atrial fibrillation: Secondary | ICD-10-CM

## 2013-03-25 DIAGNOSIS — E782 Mixed hyperlipidemia: Secondary | ICD-10-CM | POA: Diagnosis not present

## 2013-03-25 DIAGNOSIS — Z7901 Long term (current) use of anticoagulants: Secondary | ICD-10-CM | POA: Diagnosis not present

## 2013-03-25 DIAGNOSIS — E785 Hyperlipidemia, unspecified: Secondary | ICD-10-CM | POA: Insufficient documentation

## 2013-03-25 NOTE — Progress Notes (Signed)
Patient ID: Kathryn Richardson, female   DOB: [birth year], 78 y.o.   MRN: [id number]    [address], [place] [place], [place]  [address] Phone: [phone] Fax:  [phone]  Date:  03/25/2013   ID:  SHERECE HOPPER, DOB [birth year], MRN [id number]  PCP:  Gara Kroner, MD      History of Present Illness: Kathryn Richardson is a 78 y.o. female who has had PAF. She has not had any syncope. Occasional lightheadedness. Occasional palpitations. She takes an extra flecainide if she has sx, per Dr. Rayann Heman. Her HR is typically in the 50-60 range. BP controlled at home. [id number] systolic. Hypertension:  Denies : Chest pain.  Dizziness.  Leg edema.  Orthopnea.  Syncope.  she does not exercise much. She does not go out to exercise.  She has had HR in the 40s at home with high BP at that time.  BP can be high when she is in AFib.  Typically, BP is well controlled.     Wt Readings from Last 3 Encounters:  03/25/13 171 lb (77.565 kg)  10/13/11 176 lb 12.8 oz (80.196 kg)  09/03/11 133 lb (60.328 kg)     Past Medical History  Diagnosis Date  . Essential hypertension   . Gout   . GERD (gastroesophageal reflux disease)   . Insomnia   . Atrial fibrillation   . Type 2 diabetes mellitus   . Allergic rhinitis   . Hyperlipemia, mixed   . Chronic renal disease, stage IV     Current Outpatient Prescriptions  Medication Sig Dispense Refill  . atorvastatin (LIPITOR) 80 MG tablet Take 80 mg by mouth daily.      . benazepril (LOTENSIN) 20 MG tablet Take 20 mg by mouth daily.      . febuxostat (ULORIC) 40 MG tablet Take 40 mg by mouth daily.      . flecainide (TAMBOCOR) 50 MG tablet Take 50 mg by mouth 2 (two) times daily.      . furosemide (LASIX) 40 MG tablet Take 40 mg by mouth daily.      Marland Kitchen levothyroxine (SYNTHROID, LEVOTHROID) 50 MCG tablet Take 50 mcg by mouth daily. On a empty stomach.      . linagliptin (TRADJENTA) 5 MG TABS tablet Take 5 mg by mouth daily.      . Potassium Chloride Crys CR  (KLOR-CON M20 PO) Take 20 mEq by mouth daily.      Marland Kitchen warfarin (COUMADIN) 5 MG tablet TAKE 1/2 TABLET BY MOUTH EVERY DAY EXCEPT 1 TABLET ON MONDAY WEDNESDAY AND FRIDAY  30 tablet  5   No current facility-administered medications for this visit.    Allergies:    Allergies  Allergen Reactions  . Prednisone     Increase blood sugar.    Social History:  The patient  reports that she has never smoked. She does not have any smokeless tobacco history on file. She reports that she does not drink alcohol or use illicit drugs.   Family History:  The patient's family history includes Coronary artery disease (age of onset: 65) in her father.   ROS:  Please see the history of present illness.  No nausea, vomiting.  No fevers, chills.  No focal weakness.  No dysuria.    All other systems reviewed and negative.   PHYSICAL EXAM: VS:  BP 140/72  Pulse 64  Ht 5\' 1"  (1.549 m)  Wt 171 lb (77.565 kg)  BMI 32.33 kg/m2 Well  nourished, well developed, in no acute distress HEENT: normal Neck: no JVD, no carotid bruits Cardiac:  normal S1, S2; RRR;  Lungs:  clear to auscultation bilaterally, no wheezing, rhonchi or rales Abd: soft, nontender, no hepatomegaly Ext: no edema Skin: warm and dry Neuro:   no focal abnormalities noted  EKG:  NSR, nonspecific ST segment changes     ASSESSMENT AND PLAN:  Atrial fibrillation  Continue Flecainide Acetate Tablet, 50 MG, 1 tablet, Orally, every 12 hrs Notes: tolerating flecainide. No symptoms of bradycardia. Tolerating additional dose of flecainide if A. fib symptoms return.  2. Mixed hyperlipidemia  Continue Lipitor TABLET, 80 MG, TAKE 1 TABLET BY MOUTH EVERY EVENING .  LDL 208 off of lipitor in 11/14.  She restarted the lipitor.   3. Essential hypertension, benign  Continue Benazepril HCl Tablet, 20 MG, TAKE 1 TABLET BY MOUTH EVERY DAY Notes: renal function improved at last check. Off of amlodipine.  If BP increases, could add back amlodipine. She uses  hydralazine prn for SBP > 150.  4. Anticoagulant monitoring  Notes: INR to be checked and Coumadin dose to be adjusted with PharmD.  PLease see her note for details.    Signed, Mina Marble, MD, [facility] 03/25/2013 1:11 PM

## 2013-03-25 NOTE — Patient Instructions (Signed)
Your physician recommends that you continue on your current medications as directed. Please refer to the Current Medication list given to you today.  Your physician wants you to follow-up in: 6 months with Dr. Varanasi.  You will receive a reminder letter in the mail two months in advance. If you don't receive a letter, please call our office to schedule the follow-up appointment.  

## 2013-04-21 ENCOUNTER — Ambulatory Visit (INDEPENDENT_AMBULATORY_CARE_PROVIDER_SITE_OTHER): Payer: Medicare Other | Admitting: *Deleted

## 2013-04-21 DIAGNOSIS — I4891 Unspecified atrial fibrillation: Secondary | ICD-10-CM | POA: Diagnosis not present

## 2013-04-21 LAB — POCT INR: INR: 4.2

## 2013-05-01 ENCOUNTER — Other Ambulatory Visit: Payer: Self-pay | Admitting: Interventional Cardiology

## 2013-05-02 ENCOUNTER — Other Ambulatory Visit: Payer: Self-pay

## 2013-05-09 ENCOUNTER — Ambulatory Visit (INDEPENDENT_AMBULATORY_CARE_PROVIDER_SITE_OTHER): Payer: Medicare Other | Admitting: Pharmacist

## 2013-05-09 ENCOUNTER — Encounter (INDEPENDENT_AMBULATORY_CARE_PROVIDER_SITE_OTHER): Payer: Self-pay

## 2013-05-09 DIAGNOSIS — I4891 Unspecified atrial fibrillation: Secondary | ICD-10-CM

## 2013-05-09 LAB — POCT INR: INR: 2.5

## 2013-06-06 ENCOUNTER — Ambulatory Visit (INDEPENDENT_AMBULATORY_CARE_PROVIDER_SITE_OTHER): Payer: Medicare Other | Admitting: Pharmacist

## 2013-06-06 DIAGNOSIS — I4891 Unspecified atrial fibrillation: Secondary | ICD-10-CM

## 2013-06-06 DIAGNOSIS — Z5181 Encounter for therapeutic drug level monitoring: Secondary | ICD-10-CM

## 2013-06-06 LAB — POCT INR: INR: 2.1

## 2013-06-15 DIAGNOSIS — E669 Obesity, unspecified: Secondary | ICD-10-CM | POA: Diagnosis not present

## 2013-06-15 DIAGNOSIS — N2581 Secondary hyperparathyroidism of renal origin: Secondary | ICD-10-CM | POA: Diagnosis not present

## 2013-06-15 DIAGNOSIS — E1165 Type 2 diabetes mellitus with hyperglycemia: Secondary | ICD-10-CM | POA: Diagnosis not present

## 2013-06-15 DIAGNOSIS — N184 Chronic kidney disease, stage 4 (severe): Secondary | ICD-10-CM | POA: Diagnosis not present

## 2013-06-15 DIAGNOSIS — E1129 Type 2 diabetes mellitus with other diabetic kidney complication: Secondary | ICD-10-CM | POA: Diagnosis not present

## 2013-06-15 DIAGNOSIS — E559 Vitamin D deficiency, unspecified: Secondary | ICD-10-CM | POA: Diagnosis not present

## 2013-06-20 ENCOUNTER — Other Ambulatory Visit: Payer: Self-pay

## 2013-06-20 DIAGNOSIS — Z1231 Encounter for screening mammogram for malignant neoplasm of breast: Secondary | ICD-10-CM

## 2013-07-04 ENCOUNTER — Ambulatory Visit (INDEPENDENT_AMBULATORY_CARE_PROVIDER_SITE_OTHER): Payer: Medicare Other | Admitting: Pharmacist

## 2013-07-04 DIAGNOSIS — Z5181 Encounter for therapeutic drug level monitoring: Secondary | ICD-10-CM | POA: Diagnosis not present

## 2013-07-04 DIAGNOSIS — I4891 Unspecified atrial fibrillation: Secondary | ICD-10-CM | POA: Diagnosis not present

## 2013-07-04 LAB — POCT INR: INR: 2.2

## 2013-07-20 ENCOUNTER — Encounter: Payer: Medicare Other | Attending: Internal Medicine | Admitting: *Deleted

## 2013-07-20 ENCOUNTER — Encounter: Payer: Self-pay | Admitting: *Deleted

## 2013-07-20 VITALS — Ht 61.0 in | Wt 164.1 lb

## 2013-07-20 DIAGNOSIS — Z713 Dietary counseling and surveillance: Secondary | ICD-10-CM | POA: Diagnosis not present

## 2013-07-20 DIAGNOSIS — E119 Type 2 diabetes mellitus without complications: Secondary | ICD-10-CM

## 2013-07-20 NOTE — Progress Notes (Signed)
Appt start time: 1000 end time:  1130.   Assessment:  Patient was seen on  07/20/13 for individual diabetes education. Initial DX T2DM approx 2005. Went to a class and learned to count carbohydrates. Lives with husband. Grocery shopping is shared and cooking is all done by Santiago Glad. Patient does note that she is depressed related to multiple chronic disease states to include Stage 4 Kidney disease. She has discussed this with Dr. Moreen Fowler. She denies having any thoughts of hurting herself. She does feel that she is working through the depression adequately. Declined need for pharmacological intervention.  Current HbA1c: 06/15/2012 8.1%, down from 10.0% 03/2013  Preferred Learning Style:   Auditory  Visual  Hands on  Learning Readiness:   Ready  MEDICATIONS: See List: Tradjenta, Prandin  DIETARY INTAKE:    B ( AM): english muffin, peanut butter/butter, decaf hot tea, Truvia  Snk ( AM): none  L ( PM): one pop tart (blueberry no frosting), water Snk ( PM): none D ( PM): spaghetti, baked chicken, steak, potato , salad, vegetable Snk ( PM): popcorn microwave, unsalted, 18g carb ice cream Beverages: diet siera mist (10oz 2-3X week) Dr. Buddy Duty has encouraged more nuts and seeds (now has unsalted peanuts)  Usual physical activity: has treadmill at home but does not use it.     Intervention:  Nutrition counseling provided.  Discussed diabetes disease process and treatment options.  Discussed physiology of diabetes and role of obesity on insulin resistance.  Encouraged moderate weight reduction to improve glucose levels.  Discussed role of medications and diet in glucose control  Provided education on macronutrients on glucose levels.  Provided education on carb counting, importance of regularly scheduled meals/snacks, and meal planning  Discussed effects of physical activity on glucose levels and long-term glucose control.  Recommended 150 minutes of physical activity/week.  Reviewed patient  medications.  Discussed role of medication on blood glucose and possible side effects  Discussed blood glucose monitoring and interpretation.  Discussed recommended target ranges and individual ranges.    Described short-term complications: hyper- and hypo-glycemia.  Discussed causes,symptoms, and treatment options.  Discussed prevention, detection, and treatment of long-term complications.  Discussed the role of prolonged elevated glucose levels on body systems.  Discussed role of stress on blood glucose levels and discussed strategies to manage psychosocial issues.  Discussed recommendations for long-term diabetes self-care.  Established checklist for medical, dental, and emotional self-care.  Plan:  Aim for 2 Carb Choices per meal (30 grams) +/- 1 either way  Aim for 0-15 Carbs per snack if hungry  Consider reading food labels for Total Carbohydrate and Fat Grams of foods Consider  increasing your activity level by walking for 30 minutes daily as tolerated. Increase very gradually. May start out at 3 minutes as tolerated. Consider checking BG at alternate times per day as directed by MD  Continue taking medication  as directed by MD  Support Group 2nd Monday of each month 6-7:00pm in 4th floor conference room Please arrive before building doors lock at 6:00  Teaching Method Utilized:  Visual Auditory Hands on  Handouts given during visit include: Living Well with Diabetes Carb Counting and Food Label handouts Meal Plan Card My Plate  Barriers to learning/adherence to lifestyle change: energy, motivation  Diabetes self-care support plan:   [facility] support group  spouse  Demonstrated degree of understanding via:  Teach Back   Monitoring/Evaluation:  Dietary intake, exercise, test glucose, and body weight prn.

## 2013-07-20 NOTE — Patient Instructions (Addendum)
Plan:  Aim for 2 Carb Choices per meal (30 grams) +/- 1 either way  Aim for 0-15 Carbs per snack if hungry  Consider reading food labels for Total Carbohydrate and Fat Grams of foods Consider  increasing your activity level by walking for 30 minutes daily as tolerated. Increase very gradually. May start out at 3 minutes as tolerated. Consider checking BG at alternate times per day as directed by MD  Continue taking medication  as directed by MD  Support Group 2nd Monday of each month 6-7:00pm in 4th floor conference room Please arrive before building doors lock at 6:00

## 2013-07-25 ENCOUNTER — Ambulatory Visit
Admission: RE | Admit: 2013-07-25 | Discharge: 2013-07-25 | Disposition: A | Discharge status: Home / Self Care | Payer: Medicare Other | Source: Ambulatory Visit

## 2013-07-25 DIAGNOSIS — Z1231 Encounter for screening mammogram for malignant neoplasm of breast: Secondary | ICD-10-CM | POA: Diagnosis not present

## 2013-08-03 DIAGNOSIS — E119 Type 2 diabetes mellitus without complications: Secondary | ICD-10-CM | POA: Diagnosis not present

## 2013-08-08 ENCOUNTER — Ambulatory Visit (INDEPENDENT_AMBULATORY_CARE_PROVIDER_SITE_OTHER): Payer: Medicare Other

## 2013-08-08 DIAGNOSIS — I4891 Unspecified atrial fibrillation: Secondary | ICD-10-CM

## 2013-08-08 DIAGNOSIS — Z5181 Encounter for therapeutic drug level monitoring: Secondary | ICD-10-CM | POA: Diagnosis not present

## 2013-08-08 LAB — POCT INR: INR: 1.7

## 2013-08-15 DIAGNOSIS — I129 Hypertensive chronic kidney disease with stage 1 through stage 4 chronic kidney disease, or unspecified chronic kidney disease: Secondary | ICD-10-CM | POA: Diagnosis not present

## 2013-08-15 DIAGNOSIS — N184 Chronic kidney disease, stage 4 (severe): Secondary | ICD-10-CM | POA: Diagnosis not present

## 2013-08-29 ENCOUNTER — Encounter (INDEPENDENT_AMBULATORY_CARE_PROVIDER_SITE_OTHER): Payer: Self-pay

## 2013-08-29 ENCOUNTER — Ambulatory Visit (INDEPENDENT_AMBULATORY_CARE_PROVIDER_SITE_OTHER): Payer: Medicare Other | Admitting: *Deleted

## 2013-08-29 DIAGNOSIS — Z5181 Encounter for therapeutic drug level monitoring: Secondary | ICD-10-CM | POA: Diagnosis not present

## 2013-08-29 DIAGNOSIS — I4891 Unspecified atrial fibrillation: Secondary | ICD-10-CM

## 2013-08-29 LAB — POCT INR: INR: 3

## 2013-09-12 DIAGNOSIS — E559 Vitamin D deficiency, unspecified: Secondary | ICD-10-CM | POA: Diagnosis not present

## 2013-09-12 DIAGNOSIS — E1129 Type 2 diabetes mellitus with other diabetic kidney complication: Secondary | ICD-10-CM | POA: Diagnosis not present

## 2013-09-12 DIAGNOSIS — E039 Hypothyroidism, unspecified: Secondary | ICD-10-CM | POA: Diagnosis not present

## 2013-09-12 DIAGNOSIS — E349 Endocrine disorder, unspecified: Secondary | ICD-10-CM | POA: Diagnosis not present

## 2013-09-12 DIAGNOSIS — M109 Gout, unspecified: Secondary | ICD-10-CM | POA: Diagnosis not present

## 2013-09-12 DIAGNOSIS — R609 Edema, unspecified: Secondary | ICD-10-CM | POA: Diagnosis not present

## 2013-09-12 DIAGNOSIS — I4891 Unspecified atrial fibrillation: Secondary | ICD-10-CM | POA: Diagnosis not present

## 2013-09-12 DIAGNOSIS — I129 Hypertensive chronic kidney disease with stage 1 through stage 4 chronic kidney disease, or unspecified chronic kidney disease: Secondary | ICD-10-CM | POA: Diagnosis not present

## 2013-09-12 DIAGNOSIS — Z1331 Encounter for screening for depression: Secondary | ICD-10-CM | POA: Diagnosis not present

## 2013-09-12 DIAGNOSIS — E785 Hyperlipidemia, unspecified: Secondary | ICD-10-CM | POA: Diagnosis not present

## 2013-09-19 DIAGNOSIS — E669 Obesity, unspecified: Secondary | ICD-10-CM | POA: Diagnosis not present

## 2013-09-19 DIAGNOSIS — N184 Chronic kidney disease, stage 4 (severe): Secondary | ICD-10-CM | POA: Diagnosis not present

## 2013-09-19 DIAGNOSIS — Z6830 Body mass index (BMI) 30.0-30.9, adult: Secondary | ICD-10-CM | POA: Diagnosis not present

## 2013-09-19 DIAGNOSIS — E1129 Type 2 diabetes mellitus with other diabetic kidney complication: Secondary | ICD-10-CM | POA: Diagnosis not present

## 2013-09-22 DIAGNOSIS — J069 Acute upper respiratory infection, unspecified: Secondary | ICD-10-CM | POA: Diagnosis not present

## 2013-09-26 ENCOUNTER — Ambulatory Visit (INDEPENDENT_AMBULATORY_CARE_PROVIDER_SITE_OTHER): Payer: Medicare Other | Admitting: Pharmacist

## 2013-09-26 DIAGNOSIS — Z5181 Encounter for therapeutic drug level monitoring: Secondary | ICD-10-CM | POA: Diagnosis not present

## 2013-09-26 DIAGNOSIS — I4891 Unspecified atrial fibrillation: Secondary | ICD-10-CM | POA: Diagnosis not present

## 2013-09-26 LAB — POCT INR: INR: 3.1

## 2013-10-05 ENCOUNTER — Ambulatory Visit (INDEPENDENT_AMBULATORY_CARE_PROVIDER_SITE_OTHER): Payer: Medicare Other | Admitting: Interventional Cardiology

## 2013-10-05 ENCOUNTER — Encounter: Payer: Self-pay | Admitting: Interventional Cardiology

## 2013-10-05 VITALS — BP 152/60 | HR 60 | Ht 61.0 in | Wt 164.0 lb

## 2013-10-05 DIAGNOSIS — N183 Chronic kidney disease, stage 3 unspecified: Secondary | ICD-10-CM | POA: Insufficient documentation

## 2013-10-05 DIAGNOSIS — I4891 Unspecified atrial fibrillation: Secondary | ICD-10-CM

## 2013-10-05 DIAGNOSIS — N184 Chronic kidney disease, stage 4 (severe): Secondary | ICD-10-CM

## 2013-10-05 DIAGNOSIS — E782 Mixed hyperlipidemia: Secondary | ICD-10-CM | POA: Diagnosis not present

## 2013-10-05 DIAGNOSIS — I10 Essential (primary) hypertension: Secondary | ICD-10-CM

## 2013-10-05 NOTE — Progress Notes (Signed)
Patient ID: Kathryn Richardson, female   DOB: 05/11/1935, 79 y.o.   MRN: [id number] Patient ID: Kathryn Richardson, female   DOB: Sep 05, 1934, 79 y.o.   MRN: [id number]    [place], [address] [place], [place]  [address] Phone: [phone] Fax:  [phone]  Date:  10/05/2013   ID:  Kathryn Richardson, DOB 01-Oct-1934, MRN [id number]  PCP:  Gara Kroner, MD      History of Present Illness: Kathryn Richardson is a 79 y.o. female who has had PAF. She has not had any syncope. Occasional lightheadedness. Occasional palpitations. She takes an extra flecainide if she has sx, per Dr. Rayann Heman. Her HR is typically in the 50-60 range. BP controlled at home. [id number] systolic. Hypertension:  Denies : Chest pain.  Dizziness.  Leg edema.  Orthopnea.  Syncope.  she does not exercise much. She does not go out to exercise.  She has had HR in the 40s at home with high BP at that time.  BP can be high when she is in AFib.  Typically, BP is well controlled.     Wt Readings from Last 3 Encounters:  10/05/13 164 lb (74.39 kg)  07/20/13 164 lb 1.6 oz (74.435 kg)  03/25/13 171 lb (77.565 kg)     Past Medical History  Diagnosis Date  . Essential hypertension   . Gout   . GERD (gastroesophageal reflux disease)   . Insomnia   . Atrial fibrillation   . Type 2 diabetes mellitus   . Allergic rhinitis   . Hyperlipemia, mixed   . Chronic renal disease, stage IV     Current Outpatient Prescriptions  Medication Sig Dispense Refill  . atorvastatin (LIPITOR) 80 MG tablet Take 80 mg by mouth daily.      . benazepril (LOTENSIN) 20 MG tablet Take 20 mg by mouth daily.      . flecainide (TAMBOCOR) 50 MG tablet TAKE 1 TABLET BY MOUTH EVERY 12 HOURS  60 tablet  4  . furosemide (LASIX) 40 MG tablet Take 40 mg by mouth daily.      Marland Kitchen levothyroxine (SYNTHROID, LEVOTHROID) 50 MCG tablet Take 50 mcg by mouth daily. On a empty stomach.      . linagliptin (TRADJENTA) 5 MG TABS tablet Take 5 mg by mouth daily.      . Potassium  Chloride Crys CR (KLOR-CON M20 PO) Take 20 mEq by mouth daily.      . repaglinide (PRANDIN) 0.5 MG tablet Take 0.5 mg by mouth 2 (two) times daily before a meal.      . warfarin (COUMADIN) 5 MG tablet TAKE 1/2 TABLET BY MOUTH EVERY DAY EXCEPT 1 TABLET ON MONDAY WEDNESDAY AND FRIDAY  30 tablet  5   No current facility-administered medications for this visit.    Allergies:    Allergies  Allergen Reactions  . Prednisone     Increase blood sugar.    Social History:  The patient  reports that she has never smoked. She does not have any smokeless tobacco history on file. She reports that she does not drink alcohol or use illicit drugs.   Family History:  The patient's family history includes Coronary artery disease (age of onset: 50) in her father.   ROS:  Please see the history of present illness.  No nausea, vomiting.  No fevers, chills.  No focal weakness.  No dysuria.    All other systems reviewed and negative.   PHYSICAL EXAM: VS:  BP 152/60  Pulse 60  Ht 5\' 1"  (1.549 m)  Wt 164 lb (74.39 kg)  BMI 31.00 kg/m2 Well nourished, well developed, in no acute distress HEENT: normal Neck: no JVD, no carotid bruits Cardiac:  normal S1, S2; RRR;  Lungs:  clear to auscultation bilaterally, no wheezing, rhonchi or rales Abd: soft, nontender, no hepatomegaly Ext: no edema Skin: warm and dry Neuro:   no focal abnormalities noted  EKG:  NSR, nonspecific ST segment changes     ASSESSMENT AND PLAN:  Atrial fibrillation  Continue Flecainide Acetate Tablet, 50 MG, 1 tablet, Orally, every 12 hrs Notes: tolerating flecainide. No symptoms of bradycardia. Tolerating additional dose of flecainide if A. fib symptoms return.  2. Mixed hyperlipidemia  Continue Lipitor TABLET, 80 MG, TAKE 1 TABLET BY MOUTH EVERY EVENING .  LDL 208 off of lipitor in 11/14.  She restarted the lipitor.   3. Essential hypertension, benign  Continue Benazepril HCl Tablet, 20 MG, TAKE 1 TABLET BY MOUTH EVERY DAY Notes:  renal function improved at last check. Off of amlodipine.  If BP increases, could add back amlodipine. She uses hydralazine prn for SBP > 150. At home , systolcs in the 120-130 range.  4. Anticoagulant monitoring  Notes: INR to be checked and Coumadin dose to be adjusted with PharmD. 3.1 at last check.  5. Chronic kidney disease-stage 4 in the past.  Improved at last check.   Signed, Mina Marble, MD, [facility] 10/05/2013 1:57 PM

## 2013-10-05 NOTE — Patient Instructions (Signed)
Your physician recommends that you continue on your current medications as directed. Please refer to the Current Medication list given to you today.  Your physician wants you to follow-up in: 1 year with Dr. Varanasi. You will receive a reminder letter in the mail two months in advance. If you don't receive a letter, please call our office to schedule the follow-up appointment.  

## 2013-10-06 ENCOUNTER — Other Ambulatory Visit: Payer: Self-pay | Admitting: *Deleted

## 2013-10-06 MED ORDER — FLECAINIDE ACETATE 50 MG PO TABS: ORAL_TABLET | ORAL | Status: DC

## 2013-10-19 ENCOUNTER — Ambulatory Visit (INDEPENDENT_AMBULATORY_CARE_PROVIDER_SITE_OTHER): Payer: Medicare Other | Admitting: Pharmacist

## 2013-10-19 DIAGNOSIS — Z5181 Encounter for therapeutic drug level monitoring: Secondary | ICD-10-CM

## 2013-10-19 DIAGNOSIS — I4891 Unspecified atrial fibrillation: Secondary | ICD-10-CM | POA: Diagnosis not present

## 2013-10-19 LAB — POCT INR: INR: 3.5

## 2013-11-02 ENCOUNTER — Ambulatory Visit (INDEPENDENT_AMBULATORY_CARE_PROVIDER_SITE_OTHER): Payer: Medicare Other | Admitting: *Deleted

## 2013-11-02 DIAGNOSIS — Z5181 Encounter for therapeutic drug level monitoring: Secondary | ICD-10-CM | POA: Diagnosis not present

## 2013-11-02 DIAGNOSIS — I4891 Unspecified atrial fibrillation: Secondary | ICD-10-CM | POA: Diagnosis not present

## 2013-11-02 LAB — POCT INR: INR: 2.5

## 2013-11-23 ENCOUNTER — Ambulatory Visit (INDEPENDENT_AMBULATORY_CARE_PROVIDER_SITE_OTHER): Payer: Medicare Other | Admitting: *Deleted

## 2013-11-23 DIAGNOSIS — Z5181 Encounter for therapeutic drug level monitoring: Secondary | ICD-10-CM | POA: Diagnosis not present

## 2013-11-23 DIAGNOSIS — I4891 Unspecified atrial fibrillation: Secondary | ICD-10-CM

## 2013-11-23 LAB — POCT INR: INR: 2.6

## 2013-12-16 ENCOUNTER — Other Ambulatory Visit: Payer: Self-pay | Admitting: Pharmacist

## 2013-12-16 MED ORDER — WARFARIN SODIUM 5 MG PO TABS: ORAL_TABLET | ORAL | Status: DC

## 2013-12-20 ENCOUNTER — Other Ambulatory Visit: Payer: Self-pay | Admitting: Pharmacist Clinician (PhC)/ Clinical Pharmacy Specialist

## 2013-12-20 MED ORDER — WARFARIN SODIUM 5 MG PO TABS
ORAL_TABLET | ORAL | Status: DC
Start: 2013-12-20 — End: 2013-12-22

## 2013-12-21 ENCOUNTER — Ambulatory Visit (INDEPENDENT_AMBULATORY_CARE_PROVIDER_SITE_OTHER): Payer: Medicare Other | Admitting: Pharmacist

## 2013-12-21 DIAGNOSIS — I4891 Unspecified atrial fibrillation: Secondary | ICD-10-CM | POA: Diagnosis not present

## 2013-12-21 DIAGNOSIS — Z5181 Encounter for therapeutic drug level monitoring: Secondary | ICD-10-CM | POA: Diagnosis not present

## 2013-12-21 LAB — POCT INR: INR: 2.5

## 2013-12-22 ENCOUNTER — Other Ambulatory Visit: Payer: Self-pay | Admitting: *Deleted

## 2013-12-22 MED ORDER — WARFARIN SODIUM 5 MG PO TABS: ORAL_TABLET | ORAL | Status: DC

## 2013-12-22 NOTE — Telephone Encounter (Signed)
Refill done as requested 

## 2014-02-08 ENCOUNTER — Ambulatory Visit (INDEPENDENT_AMBULATORY_CARE_PROVIDER_SITE_OTHER): Payer: Medicare Other | Admitting: Pharmacist

## 2014-02-08 DIAGNOSIS — I4891 Unspecified atrial fibrillation: Secondary | ICD-10-CM

## 2014-02-08 DIAGNOSIS — Z5181 Encounter for therapeutic drug level monitoring: Secondary | ICD-10-CM

## 2014-02-08 LAB — POCT INR: INR: 2.2

## 2014-02-16 DIAGNOSIS — Z7901 Long term (current) use of anticoagulants: Secondary | ICD-10-CM | POA: Diagnosis not present

## 2014-02-16 DIAGNOSIS — I120 Hypertensive chronic kidney disease with stage 5 chronic kidney disease or end stage renal disease: Secondary | ICD-10-CM | POA: Diagnosis not present

## 2014-02-16 DIAGNOSIS — I4891 Unspecified atrial fibrillation: Secondary | ICD-10-CM | POA: Diagnosis not present

## 2014-02-16 DIAGNOSIS — E785 Hyperlipidemia, unspecified: Secondary | ICD-10-CM | POA: Diagnosis not present

## 2014-02-16 DIAGNOSIS — Z23 Encounter for immunization: Secondary | ICD-10-CM | POA: Diagnosis not present

## 2014-02-16 DIAGNOSIS — E0829 Diabetes mellitus due to underlying condition with other diabetic kidney complication: Secondary | ICD-10-CM | POA: Diagnosis not present

## 2014-02-16 DIAGNOSIS — I129 Hypertensive chronic kidney disease with stage 1 through stage 4 chronic kidney disease, or unspecified chronic kidney disease: Secondary | ICD-10-CM | POA: Diagnosis not present

## 2014-02-16 DIAGNOSIS — N183 Chronic kidney disease, stage 3 (moderate): Secondary | ICD-10-CM | POA: Diagnosis not present

## 2014-02-16 DIAGNOSIS — E039 Hypothyroidism, unspecified: Secondary | ICD-10-CM | POA: Diagnosis not present

## 2014-02-16 DIAGNOSIS — E559 Vitamin D deficiency, unspecified: Secondary | ICD-10-CM | POA: Diagnosis not present

## 2014-02-16 DIAGNOSIS — Z5181 Encounter for therapeutic drug level monitoring: Secondary | ICD-10-CM | POA: Diagnosis not present

## 2014-02-16 DIAGNOSIS — M109 Gout, unspecified: Secondary | ICD-10-CM | POA: Diagnosis not present

## 2014-02-16 DIAGNOSIS — E1122 Type 2 diabetes mellitus with diabetic chronic kidney disease: Secondary | ICD-10-CM | POA: Diagnosis not present

## 2014-02-16 DIAGNOSIS — Z79899 Other long term (current) drug therapy: Secondary | ICD-10-CM | POA: Diagnosis not present

## 2014-03-22 ENCOUNTER — Ambulatory Visit (INDEPENDENT_AMBULATORY_CARE_PROVIDER_SITE_OTHER): Payer: Medicare Other | Admitting: *Deleted

## 2014-03-22 DIAGNOSIS — Z5181 Encounter for therapeutic drug level monitoring: Secondary | ICD-10-CM | POA: Diagnosis not present

## 2014-03-22 DIAGNOSIS — I4891 Unspecified atrial fibrillation: Secondary | ICD-10-CM

## 2014-03-22 LAB — POCT INR: INR: 2.3

## 2014-04-20 DIAGNOSIS — M109 Gout, unspecified: Secondary | ICD-10-CM | POA: Diagnosis not present

## 2014-04-24 ENCOUNTER — Emergency Department (HOSPITAL_COMMUNITY)
Admission: EM | Admit: 2014-04-24 | Discharge: 2014-04-25 | Disposition: A | Discharge status: Home / Self Care | Payer: Medicare Other | Source: Home / Self Care | Attending: Emergency Medicine | Admitting: Emergency Medicine

## 2014-04-24 ENCOUNTER — Encounter (HOSPITAL_COMMUNITY): Payer: Self-pay

## 2014-04-24 DIAGNOSIS — I4892 Unspecified atrial flutter: Secondary | ICD-10-CM | POA: Diagnosis present

## 2014-04-24 DIAGNOSIS — I480 Paroxysmal atrial fibrillation: Secondary | ICD-10-CM

## 2014-04-24 DIAGNOSIS — Z7901 Long term (current) use of anticoagulants: Secondary | ICD-10-CM | POA: Diagnosis not present

## 2014-04-24 DIAGNOSIS — K219 Gastro-esophageal reflux disease without esophagitis: Secondary | ICD-10-CM | POA: Diagnosis present

## 2014-04-24 DIAGNOSIS — I495 Sick sinus syndrome: Secondary | ICD-10-CM | POA: Diagnosis not present

## 2014-04-24 DIAGNOSIS — E782 Mixed hyperlipidemia: Secondary | ICD-10-CM | POA: Diagnosis present

## 2014-04-24 DIAGNOSIS — Z8249 Family history of ischemic heart disease and other diseases of the circulatory system: Secondary | ICD-10-CM | POA: Diagnosis not present

## 2014-04-24 DIAGNOSIS — Z950 Presence of cardiac pacemaker: Secondary | ICD-10-CM | POA: Diagnosis not present

## 2014-04-24 DIAGNOSIS — I129 Hypertensive chronic kidney disease with stage 1 through stage 4 chronic kidney disease, or unspecified chronic kidney disease: Secondary | ICD-10-CM | POA: Diagnosis present

## 2014-04-24 DIAGNOSIS — Z7952 Long term (current) use of systemic steroids: Secondary | ICD-10-CM | POA: Diagnosis not present

## 2014-04-24 DIAGNOSIS — M109 Gout, unspecified: Secondary | ICD-10-CM | POA: Diagnosis present

## 2014-04-24 DIAGNOSIS — R001 Bradycardia, unspecified: Secondary | ICD-10-CM | POA: Diagnosis not present

## 2014-04-24 DIAGNOSIS — R918 Other nonspecific abnormal finding of lung field: Secondary | ICD-10-CM | POA: Diagnosis not present

## 2014-04-24 DIAGNOSIS — N184 Chronic kidney disease, stage 4 (severe): Secondary | ICD-10-CM | POA: Diagnosis not present

## 2014-04-24 DIAGNOSIS — R55 Syncope and collapse: Secondary | ICD-10-CM | POA: Diagnosis not present

## 2014-04-24 DIAGNOSIS — E119 Type 2 diabetes mellitus without complications: Secondary | ICD-10-CM | POA: Diagnosis not present

## 2014-04-24 LAB — CBC
HCT: 45.8 % (ref 36.0–46.0)
HEMOGLOBIN: 15.6 g/dL — AB (ref 12.0–15.0)
MCH: 32.1 pg (ref 26.0–34.0)
MCHC: 34.1 g/dL (ref 30.0–36.0)
MCV: 94.2 fL (ref 78.0–100.0)
PLATELETS: 220 10*3/uL (ref 150–400)
RBC: 4.86 MIL/uL (ref 3.87–5.11)
RDW: 13 % (ref 11.5–15.5)
WBC: 11.9 10*3/uL — AB (ref 4.0–10.5)

## 2014-04-24 LAB — BASIC METABOLIC PANEL
ANION GAP: 17 — AB (ref 5–15)
BUN: 49 mg/dL — ABNORMAL HIGH (ref 6–23)
CHLORIDE: 102 meq/L (ref 96–112)
CO2: 22 meq/L (ref 19–32)
Calcium: 9.3 mg/dL (ref 8.4–10.5)
Creatinine, Ser: 1.7 mg/dL — ABNORMAL HIGH (ref 0.50–1.10)
GFR calc Af Amer: 32 mL/min — ABNORMAL LOW (ref >90)
GFR calc non Af Amer: 28 mL/min — ABNORMAL LOW (ref >90)
Glucose, Bld: 188 mg/dL — ABNORMAL HIGH (ref 70–99)
Potassium: 4.2 mEq/L (ref 3.7–5.3)
SODIUM: 141 meq/L (ref 137–147)

## 2014-04-24 LAB — I-STAT TROPONIN, ED: TROPONIN I, POC: 0.02 ng/mL (ref 0.00–0.08)

## 2014-04-24 LAB — PROTIME-INR
INR: 2 — ABNORMAL HIGH (ref 0.00–1.49)
Prothrombin Time: 22.9 seconds — ABNORMAL HIGH (ref 11.6–15.2)

## 2014-04-24 MED ORDER — DILTIAZEM HCL 25 MG/5ML IV SOLN
20.0000 mg | Freq: Once | INTRAVENOUS | Status: DC
  Filled 2014-04-24: qty 5

## 2014-04-24 NOTE — ED Notes (Signed)
Pt presents with c/o palpitations that started between 4-4:30 this afternoon. Pt reports she feels like something is fluttering in her chest. Pt has a hx of a-fib. Also reporting that her BP is high.

## 2014-04-24 NOTE — ED Notes (Signed)
Pt c/o palpitations since 4:30pm this afternoon. Pt denies dizziness, N/V. Pt A&Ox4.

## 2014-04-24 NOTE — ED Provider Notes (Signed)
CSN: [id number]     Arrival date & time 04/24/14  2201 History   First MD Initiated Contact with Patient 04/24/14 2238     Chief Complaint  Patient presents with  . Palpitations      HPI  Cardiologist Dr. Irish Lack.  EP Dr. Rayann Heman.  Patient presents for evaluation of nature fibrillation. Positive for 30 Ceftin and felt like her heart started beating irregularly and rapid. Felt a fluttering in her chest. She has history of paroxysmal defibrillation over the last few years. She takes flecainide 50 mg twice a day. Had previously been on 200 mg total daily with excellent control. However, was having episodes of bradycardia, this was tapered about 1 year ago to 100 mg per day. She Rolled 100 mg today since her episode started but has not felt conversion yet and presents here. Denies pain, weakness, shortness of breath, orthostasis, or any additional symptoms other awareness of irregularity.  Recent changes in her baseline health. No changes in medications.  Past Medical History  Diagnosis Date  . Essential hypertension   . Gout   . GERD (gastroesophageal reflux disease)   . Insomnia   . Atrial fibrillation   . Type 2 diabetes mellitus   . Allergic rhinitis   . Hyperlipemia, mixed   . Chronic renal disease, stage IV    Past Surgical History  Procedure Laterality Date  . Cesarean section      x2  . Esophageal dilation     Family History  Problem Relation Age of Onset  . Coronary artery disease Father 27   History  Substance Use Topics  . Smoking status: Never Smoker   . Smokeless tobacco: Not on file  . Alcohol Use: No   OB History    No data available     Review of Systems  Constitutional: Negative for fever, chills, diaphoresis, appetite change and fatigue.  HENT: Negative for mouth sores, sore throat and trouble swallowing.   Eyes: Negative for visual disturbance.  Respiratory: Negative for cough, chest tightness, shortness of breath and wheezing.   Cardiovascular:  Positive for palpitations. Negative for chest pain.  Gastrointestinal: Negative for nausea, vomiting, abdominal pain, diarrhea and abdominal distention.  Endocrine: Negative for polydipsia, polyphagia and polyuria.  Genitourinary: Negative for dysuria, frequency and hematuria.  Musculoskeletal: Negative for gait problem.  Skin: Negative for color change, pallor and rash.  Neurological: Negative for dizziness, syncope, light-headedness and headaches.  Hematological: Does not bruise/bleed easily.  Psychiatric/Behavioral: Negative for behavioral problems and confusion.      Allergies  Prednisone  Home Medications   Prior to Admission medications   Medication Sig Start Date End Date Taking? Authorizing Provider  atorvastatin (LIPITOR) 80 MG tablet Take 80 mg by mouth daily.   Yes Historical Provider, MD  benazepril (LOTENSIN) 20 MG tablet Take 20 mg by mouth daily.   Yes Historical Provider, MD  flecainide (TAMBOCOR) 50 MG tablet TAKE 1 TABLET BY MOUTH EVERY 12 HOURS 10/06/13  Yes Jettie Booze, MD  furosemide (LASIX) 40 MG tablet Take 40 mg by mouth daily.   Yes Historical Provider, MD  levothyroxine (SYNTHROID, LEVOTHROID) 50 MCG tablet Take 50 mcg by mouth daily. On a empty stomach.   Yes Historical Provider, MD  linagliptin (TRADJENTA) 5 MG TABS tablet Take 5 mg by mouth daily.   Yes Historical Provider, MD  Potassium Chloride Crys CR (KLOR-CON M20 PO) Take 20 mEq by mouth daily.   Yes Historical Provider, MD  predniSONE (DELTASONE) 20  MG tablet Take 20 mg by mouth daily with breakfast. 9 Day Taper. Take 3 tablets Daily For 3 Days, Take 2 tablets Daily for 3 Days, Take 1 tablet Daily for 3 Days.   Yes Historical Provider, MD  repaglinide (PRANDIN) 0.5 MG tablet Take 0.5 mg by mouth 2 (two) times daily before a meal.   Yes Historical Provider, MD  warfarin (COUMADIN) 5 MG tablet Take as directed by coumadin clinic Patient taking differently: Take 2.5-5 mg by mouth daily. Take 1  tablet (5 mg) on (Mon, Wed) & Take 1/2 tablet (2.5 mg) on (Tues, Thurs, Fri, Sat & Sun). 12/22/13  Yes Jettie Booze, MD   BP 157/95 mmHg  Pulse 98  Temp(Src) 97.6 F (36.4 C) (Oral)  Resp 19  SpO2 97% Physical Exam  Constitutional: She is oriented to person, place, and time. She appears well-developed and well-nourished. No distress.  HENT:  Head: Normocephalic.  Eyes: Conjunctivae are normal. Pupils are equal, round, and reactive to light. No scleral icterus.  Neck: Normal range of motion. Neck supple. No thyromegaly present.  Cardiovascular: An irregularly irregular rhythm present. Tachycardia present.  Exam reveals no gallop and no friction rub.   No murmur heard. A. fib with rapid response on the monitor. Rate between 115 to 120.  Pulmonary/Chest: Effort normal and breath sounds normal. No respiratory distress. She has no wheezes. She has no rales.  Abdominal: Soft. Bowel sounds are normal. She exhibits no distension. There is no tenderness. There is no rebound.  Musculoskeletal: Normal range of motion.  Neurological: She is alert and oriented to person, place, and time.  Skin: Skin is warm and dry. No rash noted.  Psychiatric: She has a normal mood and affect. Her behavior is normal.    ED Course  Procedures (including critical care time) Labs Review Labs Reviewed  CBC - Abnormal; Notable for the following:    WBC 11.9 (*)    Hemoglobin 15.6 (*)    All other components within normal limits  BASIC METABOLIC PANEL - Abnormal; Notable for the following:    Glucose, Bld 188 (*)    BUN 49 (*)    Creatinine, Ser 1.70 (*)    GFR calc non Af Amer 28 (*)    GFR calc Af Amer 32 (*)    Anion gap 17 (*)    All other components within normal limits  PROTIME-INR - Abnormal; Notable for the following:    Prothrombin Time 22.9 (*)    INR 2.00 (*)    All other components within normal limits  I-STAT TROPOININ, ED    Imaging Review No results found.   EKG  Interpretation   Date/Time:  Monday April 24 2014 22:06:15 EST Ventricular Rate:  112 PR Interval:    QRS Duration: 90 QT Interval:  290 QTC Calculation: 396 R Axis:   58 Text Interpretation:  Atrial fibrillation Probable LVH with secondary  repol abnrm Baseline wander in lead(s) V1 Confirmed by Jeneen Rinks  MD, Banks  [phone]) on 04/24/2014 10:39:46 PM      MDM   Final diagnoses:  Paroxysmal atrial fibrillation    Patient's INR is 2. Given 1 dose of Cardizem IV. Rate control now between 75-95. Asymptomatic. Please call a cardiology upon the patient's arrival. This is not the returned digit. Patient and family have the mast made 2 additional times to go home. She is quite a symptomatically. She is oppose the idea of any additional medications or cardioversion at this point. She  is clinically quite stable without signs of congestive heart failure, hypotension, or instability. I think this is appropriate. She'll contact her cardiologist tomorrow for further instructions.    Tanna Furry, MD 04/25/14 [phone]

## 2014-04-25 ENCOUNTER — Encounter (HOSPITAL_COMMUNITY): Payer: Self-pay

## 2014-04-25 ENCOUNTER — Encounter: Payer: Self-pay | Admitting: Physician Assistant

## 2014-04-25 ENCOUNTER — Telehealth: Payer: Self-pay | Admitting: Interventional Cardiology

## 2014-04-25 ENCOUNTER — Ambulatory Visit (INDEPENDENT_AMBULATORY_CARE_PROVIDER_SITE_OTHER): Payer: Medicare Other | Admitting: Cardiology

## 2014-04-25 ENCOUNTER — Other Ambulatory Visit: Payer: Self-pay | Admitting: Physician Assistant

## 2014-04-25 ENCOUNTER — Inpatient Hospital Stay (HOSPITAL_COMMUNITY)
Admission: EM | Admit: 2014-04-25 | Discharge: 2014-04-27 | DRG: 243 | Disposition: A | Discharge status: Home / Self Care | Payer: Medicare Other | Attending: Cardiology | Admitting: Cardiology

## 2014-04-25 ENCOUNTER — Emergency Department (HOSPITAL_COMMUNITY): Payer: Medicare Other

## 2014-04-25 VITALS — BP 132/70 | HR 122 | Ht 61.0 in | Wt 163.0 lb

## 2014-04-25 DIAGNOSIS — R55 Syncope and collapse: Secondary | ICD-10-CM | POA: Diagnosis not present

## 2014-04-25 DIAGNOSIS — E785 Hyperlipidemia, unspecified: Secondary | ICD-10-CM | POA: Diagnosis present

## 2014-04-25 DIAGNOSIS — I10 Essential (primary) hypertension: Secondary | ICD-10-CM | POA: Diagnosis not present

## 2014-04-25 DIAGNOSIS — R001 Bradycardia, unspecified: Secondary | ICD-10-CM | POA: Diagnosis present

## 2014-04-25 DIAGNOSIS — I480 Paroxysmal atrial fibrillation: Secondary | ICD-10-CM

## 2014-04-25 DIAGNOSIS — Z7901 Long term (current) use of anticoagulants: Secondary | ICD-10-CM

## 2014-04-25 DIAGNOSIS — I4892 Unspecified atrial flutter: Secondary | ICD-10-CM

## 2014-04-25 DIAGNOSIS — E119 Type 2 diabetes mellitus without complications: Secondary | ICD-10-CM | POA: Diagnosis not present

## 2014-04-25 DIAGNOSIS — K219 Gastro-esophageal reflux disease without esophagitis: Secondary | ICD-10-CM | POA: Diagnosis present

## 2014-04-25 DIAGNOSIS — E782 Mixed hyperlipidemia: Secondary | ICD-10-CM | POA: Diagnosis present

## 2014-04-25 DIAGNOSIS — N183 Chronic kidney disease, stage 3 unspecified: Secondary | ICD-10-CM | POA: Diagnosis present

## 2014-04-25 DIAGNOSIS — N184 Chronic kidney disease, stage 4 (severe): Secondary | ICD-10-CM | POA: Diagnosis present

## 2014-04-25 DIAGNOSIS — Z950 Presence of cardiac pacemaker: Secondary | ICD-10-CM | POA: Diagnosis not present

## 2014-04-25 DIAGNOSIS — Z8249 Family history of ischemic heart disease and other diseases of the circulatory system: Secondary | ICD-10-CM

## 2014-04-25 DIAGNOSIS — I495 Sick sinus syndrome: Principal | ICD-10-CM | POA: Diagnosis present

## 2014-04-25 DIAGNOSIS — I4891 Unspecified atrial fibrillation: Secondary | ICD-10-CM | POA: Diagnosis present

## 2014-04-25 DIAGNOSIS — Z7952 Long term (current) use of systemic steroids: Secondary | ICD-10-CM

## 2014-04-25 DIAGNOSIS — R404 Transient alteration of awareness: Secondary | ICD-10-CM | POA: Diagnosis not present

## 2014-04-25 DIAGNOSIS — R531 Weakness: Secondary | ICD-10-CM | POA: Diagnosis not present

## 2014-04-25 DIAGNOSIS — I129 Hypertensive chronic kidney disease with stage 1 through stage 4 chronic kidney disease, or unspecified chronic kidney disease: Secondary | ICD-10-CM | POA: Diagnosis present

## 2014-04-25 DIAGNOSIS — M109 Gout, unspecified: Secondary | ICD-10-CM | POA: Diagnosis present

## 2014-04-25 LAB — D-DIMER, QUANTITATIVE (NOT AT ARMC): D-Dimer, Quant: <0.27 ug/mL-FEU (ref 0.00–0.48)

## 2014-04-25 LAB — CBC WITH DIFFERENTIAL/PLATELET
Basophils Absolute: 0 10*3/uL (ref 0.0–0.1)
Basophils Relative: 0 % (ref 0–1)
Eosinophils Absolute: 0 10*3/uL (ref 0.0–0.7)
Eosinophils Relative: 0 % (ref 0–5)
HEMATOCRIT: 47.7 % — AB (ref 36.0–46.0)
HEMOGLOBIN: 16.3 g/dL — AB (ref 12.0–15.0)
LYMPHS ABS: 1.5 10*3/uL (ref 0.7–4.0)
LYMPHS PCT: 14 % (ref 12–46)
MCH: 31.7 pg (ref 26.0–34.0)
MCHC: 34.2 g/dL (ref 30.0–36.0)
MCV: 92.8 fL (ref 78.0–100.0)
MONO ABS: 1.1 10*3/uL — AB (ref 0.1–1.0)
MONOS PCT: 11 % (ref 3–12)
NEUTROS ABS: 7.6 10*3/uL (ref 1.7–7.7)
NEUTROS PCT: 75 % (ref 43–77)
Platelets: 197 10*3/uL (ref 150–400)
RBC: 5.14 MIL/uL — AB (ref 3.87–5.11)
RDW: 13 % (ref 11.5–15.5)
WBC: 10.3 10*3/uL (ref 4.0–10.5)

## 2014-04-25 LAB — I-STAT CHEM 8, ED
BUN: 43 mg/dL — AB (ref 6–23)
CALCIUM ION: 1.02 mmol/L — AB (ref 1.13–1.30)
CHLORIDE: 105 meq/L (ref 96–112)
Creatinine, Ser: 1.7 mg/dL — ABNORMAL HIGH (ref 0.50–1.10)
GLUCOSE: 268 mg/dL — AB (ref 70–99)
HEMATOCRIT: 50 % — AB (ref 36.0–46.0)
Hemoglobin: 17 g/dL — ABNORMAL HIGH (ref 12.0–15.0)
POTASSIUM: 4.6 meq/L (ref 3.7–5.3)
Sodium: 138 mEq/L (ref 137–147)
TCO2: 23 mmol/L (ref 0–100)

## 2014-04-25 LAB — MRSA PCR SCREENING: MRSA BY PCR: NEGATIVE

## 2014-04-25 LAB — HEMOGLOBIN A1C
Hgb A1c MFr Bld: 7.6 % — ABNORMAL HIGH (ref <5.7)
Mean Plasma Glucose: 171 mg/dL — ABNORMAL HIGH (ref <117)

## 2014-04-25 LAB — GLUCOSE, CAPILLARY
GLUCOSE-CAPILLARY: 235 mg/dL — AB (ref 70–99)
GLUCOSE-CAPILLARY: 305 mg/dL — AB (ref 70–99)

## 2014-04-25 LAB — PRO B NATRIURETIC PEPTIDE: Pro B Natriuretic peptide (BNP): 4716 pg/mL — ABNORMAL HIGH (ref 0–450)

## 2014-04-25 LAB — TSH: TSH: 1.23 u[IU]/mL (ref 0.350–4.500)

## 2014-04-25 LAB — TROPONIN I: Troponin I: <0.3 ng/mL (ref <0.30)

## 2014-04-25 LAB — I-STAT TROPONIN, ED: Troponin i, poc: 0.03 ng/mL (ref 0.00–0.08)

## 2014-04-25 LAB — PROTIME-INR
INR: 2.14 — ABNORMAL HIGH (ref 0.00–1.49)
PROTHROMBIN TIME: 24.1 s — AB (ref 11.6–15.2)

## 2014-04-25 MED ORDER — WARFARIN SODIUM 2.5 MG PO TABS
2.5000 mg | ORAL_TABLET | Freq: Once | ORAL | Status: AC
  Administered 2014-04-25: 2.5 mg via ORAL
  Filled 2014-04-25: qty 1

## 2014-04-25 MED ORDER — DILTIAZEM HCL 100 MG IV SOLR
5.0000 mg/h | INTRAVENOUS | Status: DC
  Administered 2014-04-25 – 2014-04-26 (×3): 10 mg/h via INTRAVENOUS
  Filled 2014-04-25: qty 100

## 2014-04-25 MED ORDER — POTASSIUM CHLORIDE CRYS ER 20 MEQ PO TBCR
20.0000 meq | EXTENDED_RELEASE_TABLET | Freq: Every day | ORAL | Status: DC
  Administered 2014-04-26 – 2014-04-27 (×2): 20 meq via ORAL
  Filled 2014-04-25 (×2): qty 1

## 2014-04-25 MED ORDER — WARFARIN - PHARMACIST DOSING INPATIENT: Freq: Every day | Status: DC

## 2014-04-25 MED ORDER — REPAGLINIDE 0.5 MG PO TABS
0.5000 mg | ORAL_TABLET | Freq: Two times a day (BID) | ORAL | Status: DC
  Administered 2014-04-25 – 2014-04-27 (×3): 0.5 mg via ORAL
  Filled 2014-04-25 (×8): qty 1

## 2014-04-25 MED ORDER — LINAGLIPTIN 5 MG PO TABS
5.0000 mg | ORAL_TABLET | Freq: Every day | ORAL | Status: DC
  Administered 2014-04-26 – 2014-04-27 (×2): 5 mg via ORAL
  Filled 2014-04-25 (×2): qty 1

## 2014-04-25 MED ORDER — ATORVASTATIN CALCIUM 80 MG PO TABS
80.0000 mg | ORAL_TABLET | Freq: Every day | ORAL | Status: DC
  Administered 2014-04-26 – 2014-04-27 (×2): 80 mg via ORAL
  Filled 2014-04-25 (×2): qty 1

## 2014-04-25 MED ORDER — FUROSEMIDE 40 MG PO TABS
40.0000 mg | ORAL_TABLET | Freq: Every day | ORAL | Status: DC
  Administered 2014-04-26 – 2014-04-27 (×2): 40 mg via ORAL
  Filled 2014-04-25 (×2): qty 1

## 2014-04-25 MED ORDER — PREDNISONE 20 MG PO TABS: 20.0000 mg | ORAL_TABLET | Freq: Every day | ORAL | Status: DC

## 2014-04-25 MED ORDER — ONDANSETRON HCL 4 MG/2ML IJ SOLN: 4.0000 mg | Freq: Four times a day (QID) | INTRAMUSCULAR | Status: DC | PRN

## 2014-04-25 MED ORDER — LEVOTHYROXINE SODIUM 50 MCG PO TABS
50.0000 ug | ORAL_TABLET | Freq: Every day | ORAL | Status: DC
  Administered 2014-04-26 – 2014-04-27 (×2): 50 ug via ORAL
  Filled 2014-04-25 (×2): qty 1

## 2014-04-25 MED ORDER — BENAZEPRIL HCL 20 MG PO TABS
20.0000 mg | ORAL_TABLET | Freq: Every day | ORAL | Status: DC
  Administered 2014-04-26 – 2014-04-27 (×2): 20 mg via ORAL
  Filled 2014-04-25 (×2): qty 1

## 2014-04-25 MED ORDER — ACETAMINOPHEN 325 MG PO TABS: 650.0000 mg | ORAL_TABLET | ORAL | Status: DC | PRN

## 2014-04-25 MED ORDER — INSULIN ASPART 100 UNIT/ML ~~LOC~~ SOLN
0.0000 [IU] | Freq: Three times a day (TID) | SUBCUTANEOUS | Status: DC
  Administered 2014-04-25 – 2014-04-27 (×4): 7 [IU] via SUBCUTANEOUS
  Administered 2014-04-27: 4 [IU] via SUBCUTANEOUS

## 2014-04-25 MED ORDER — DILTIAZEM LOAD VIA INFUSION
10.0000 mg | Freq: Once | INTRAVENOUS | Status: AC
  Administered 2014-04-25: 10 mg via INTRAVENOUS
  Filled 2014-04-25: qty 10

## 2014-04-25 MED ORDER — FLECAINIDE ACETATE 50 MG PO TABS
50.0000 mg | ORAL_TABLET | Freq: Two times a day (BID) | ORAL | Status: DC
  Administered 2014-04-25 – 2014-04-26 (×2): 50 mg via ORAL
  Filled 2014-04-25 (×3): qty 1

## 2014-04-25 MED ORDER — SODIUM CHLORIDE 0.9 % IV BOLUS (SEPSIS)
500.0000 mL | Freq: Once | INTRAVENOUS | Status: AC
  Administered 2014-04-25: 500 mL via INTRAVENOUS

## 2014-04-25 MED ORDER — DILTIAZEM HCL 100 MG IV SOLR
5.0000 mg/h | INTRAVENOUS | Status: DC
  Administered 2014-04-25: 5 mg/h via INTRAVENOUS

## 2014-04-25 MED ORDER — PREDNISONE 20 MG PO TABS
40.0000 mg | ORAL_TABLET | Freq: Every day | ORAL | Status: DC
  Filled 2014-04-25 (×2): qty 2

## 2014-04-25 MED ORDER — DILTIAZEM LOAD VIA INFUSION
10.0000 mg | Freq: Once | INTRAVENOUS | Status: AC
  Administered 2014-04-25: 10 mg via INTRAVENOUS

## 2014-04-25 NOTE — ED Provider Notes (Signed)
CSN: [id number]     Arrival date & time 04/25/14  1259 History   First MD Initiated Contact with Patient 04/25/14 1307     Chief Complaint  Patient presents with  . Loss of Consciousness  . Weakness     (Consider location/radiation/quality/duration/timing/severity/associated sxs/prior Treatment) HPI    Kathryn Richardson is a 79 y.o. female who presents for evaluation of syncope at her doctors office. She was being evaluated for AF with RVR, last night. No prodrome today. She is taking her usual medications. She did not take an extra Flecanide today. She came for eval., by EMS, without specific treatment. No other recent illnesses. There are no other known modifying factors.  Past Medical History  Diagnosis Date  . Essential hypertension   . Gout   . GERD (gastroesophageal reflux disease)   . Insomnia   . Atrial fibrillation   . Type 2 diabetes mellitus   . Allergic rhinitis   . Hyperlipemia, mixed   . Chronic renal disease, stage IV    Past Surgical History  Procedure Laterality Date  . Cesarean section      x2  . Esophageal dilation     Family History  Problem Relation Age of Onset  . Coronary artery disease Father 22   History  Substance Use Topics  . Smoking status: Never Smoker   . Smokeless tobacco: Not on file  . Alcohol Use: No   OB History    No data available     Review of Systems  All other systems reviewed and are negative.     Allergies  Prednisone  Home Medications   Prior to Admission medications   Medication Sig Start Date End Date Taking? Authorizing Provider  atorvastatin (LIPITOR) 80 MG tablet Take 80 mg by mouth daily.   Yes Historical Provider, MD  benazepril (LOTENSIN) 20 MG tablet Take 20 mg by mouth daily.   Yes Historical Provider, MD  flecainide (TAMBOCOR) 50 MG tablet TAKE 1 TABLET BY MOUTH EVERY 12 HOURS 10/06/13  Yes Jettie Booze, MD  furosemide (LASIX) 40 MG tablet Take 40 mg by mouth daily.   Yes Historical Provider, MD   levothyroxine (SYNTHROID, LEVOTHROID) 50 MCG tablet Take 50 mcg by mouth daily. On a empty stomach.   Yes Historical Provider, MD  linagliptin (TRADJENTA) 5 MG TABS tablet Take 5 mg by mouth daily.   Yes Historical Provider, MD  Potassium Chloride Crys CR (KLOR-CON M20 PO) Take 20 mEq by mouth daily.   Yes Historical Provider, MD  predniSONE (DELTASONE) 20 MG tablet Take 20 mg by mouth daily with breakfast. 9 Day Taper. Take 3 tablets Daily For 3 Days, Take 2 tablets Daily for 3 Days, Take 1 tablet Daily for 3 Days.   Yes Historical Provider, MD  repaglinide (PRANDIN) 0.5 MG tablet Take 0.5 mg by mouth 2 (two) times daily before a meal.   Yes Historical Provider, MD  warfarin (COUMADIN) 5 MG tablet Take as directed by coumadin clinic Patient taking differently: Take 2.5-5 mg by mouth daily. Take 1 tablet (5 mg) on (Mon, Wed) & Take 1/2 tablet (2.5 mg) on (Tues, Thurs, Fri, Sat & Sun). 12/22/13  Yes Jettie Booze, MD  hydrALAZINE (APRESOLINE) 25 MG tablet Take 25 mg by mouth 3 (three) times daily. 04/24/14   Historical Provider, MD  KLOR-CON M20 20 MEQ tablet Take 20 mEq by mouth daily. 04/09/14   Historical Provider, MD   BP 118/55 mmHg  Pulse 115  Temp(Src)  98.3 F (36.8 C) (Oral)  Resp 14  Ht 5\' 3"  (1.6 m)  Wt 162 lb (73.483 kg)  BMI 28.70 kg/m2  SpO2 98% Physical Exam  Constitutional: She is oriented to person, place, and time. She appears well-developed.  Elderly, frail.  HENT:  Head: Normocephalic and atraumatic.  Right Ear: External ear normal.  Left Ear: External ear normal.  Eyes: Conjunctivae and EOM are normal. Pupils are equal, round, and reactive to light.  Neck: Normal range of motion and phonation normal. Neck supple.  Cardiovascular: Normal heart sounds.   Irregular tachycardia  Pulmonary/Chest: Effort normal and breath sounds normal. She exhibits no bony tenderness.  Abdominal: Soft. There is no tenderness.  Musculoskeletal: Normal range of motion. She exhibits  edema. She exhibits no tenderness.  Neurological: She is alert and oriented to person, place, and time. No cranial nerve deficit or sensory deficit. She exhibits normal muscle tone. Coordination normal.  Skin: Skin is warm, dry and intact.  Psychiatric: She has a normal mood and affect. Her behavior is normal. Judgment and thought content normal.  Nursing note and vitals reviewed.   ED Course  Procedures (including critical care time) Labs Review Medications  potassium chloride SA (K-DUR,KLOR-CON) CR tablet 20 mEq (not administered)  flecainide (TAMBOCOR) tablet 50 mg (not administered)  repaglinide (PRANDIN) tablet 0.5 mg (0.5 mg Oral Given 04/25/14 1658)  linagliptin (TRADJENTA) tablet 5 mg (not administered)  atorvastatin (LIPITOR) tablet 80 mg (not administered)  benazepril (LOTENSIN) tablet 20 mg (not administered)  furosemide (LASIX) tablet 40 mg (not administered)  levothyroxine (SYNTHROID, LEVOTHROID) tablet 50 mcg (not administered)  diltiazem (CARDIZEM) 1 mg/mL load via infusion 10 mg (10 mg Intravenous Given by Other 04/25/14 1447)    And  diltiazem (CARDIZEM) 100 mg in dextrose 5 % 100 mL (1 mg/mL) infusion (10 mg/hr Intravenous New Bag/Given 04/25/14 1949)  acetaminophen (TYLENOL) tablet 650 mg (not administered)  ondansetron (ZOFRAN) injection 4 mg (not administered)  insulin aspart (novoLOG) injection 0-20 Units (7 Units Subcutaneous Given 04/25/14 1740)  predniSONE (DELTASONE) tablet 40 mg (not administered)    Followed by  predniSONE (DELTASONE) tablet 20 mg (not administered)  Warfarin - Pharmacist Dosing Inpatient ( Does not apply Duplicate 0000000 [id number])  diltiazem (CARDIZEM) 1 mg/mL load via infusion 10 mg (0 mg Intravenous Stopped 04/25/14 1320)  sodium chloride 0.9 % bolus 500 mL (500 mLs Intravenous New Bag/Given 04/25/14 1352)  warfarin (COUMADIN) tablet 2.5 mg (2.5 mg Oral Given 04/25/14 1739)    Patient Vitals for the past 24 hrs:  BP Temp Temp src Pulse  Resp SpO2 Height Weight  04/25/14 1934 (!) 118/55 mmHg 98.3 F (36.8 C) Oral - 14 98 % - -  04/25/14 1900 (!) 106/53 mmHg - - - 16 - - -  04/25/14 1830 (!) 112/48 mmHg - - - 14 - - -  04/25/14 1800 (!) 115/58 mmHg - - - 14 - - -  04/25/14 1730 137/70 mmHg - - - 17 - - -  04/25/14 1702 133/76 mmHg - - - (!) 21 - - -  04/25/14 1626 (!) 138/93 mmHg - - - 15 - - -  04/25/14 1556 (!) 140/93 mmHg - - - 13 - - -  04/25/14 1527 135/78 mmHg - - - 14 - - -  04/25/14 1500 (!) 142/87 mmHg - - - 16 - - -  04/25/14 1427 (!) 177/92 mmHg 98 F (36.7 C) Oral (!) 115 14 98 % - -  04/25/14 1345 180/87  mmHg - - 117 14 98 % - -  04/25/14 1330 167/96 mmHg - - 115 15 99 % - -  04/25/14 1316 (!) 197/122 mmHg 97.8 F (36.6 C) Oral (!) 130 15 98 % - -  04/25/14 1315 (!) 192/103 mmHg - - 106 17 98 % - -  04/25/14 1306 (!) 187/132 mmHg - - (!) 145 18 99 % 5\' 3"  (1.6 m) 162 lb (73.483 kg)    CRITICAL CARE Performed by: Daleen Bo L Total critical care time: 30 minutes Critical care time was exclusive of separately billable procedures and treating other patients. Critical care was necessary to treat or prevent imminent or life-threatening deterioration. Critical care was time spent personally by me on the following activities: development of treatment plan with patient and/or surrogate as well as nursing, discussions with consultants, evaluation of patient's response to treatment, examination of patient, obtaining history from patient or surrogate, ordering and performing treatments and interventions, ordering and review of laboratory studies, ordering and review of radiographic studies, pulse oximetry and re-evaluation of patient's condition.    Labs Reviewed  CBC WITH DIFFERENTIAL - Abnormal; Notable for the following:    RBC 5.14 (*)    Hemoglobin 16.3 (*)    HCT 47.7 (*)    Monocytes Absolute 1.1 (*)    All other components within normal limits  PROTIME-INR - Abnormal; Notable for the following:     Prothrombin Time 24.1 (*)    INR 2.14 (*)    All other components within normal limits  PRO B NATRIURETIC PEPTIDE - Abnormal; Notable for the following:    Pro B Natriuretic peptide (BNP) 4716.0 (*)    All other components within normal limits  GLUCOSE, CAPILLARY - Abnormal; Notable for the following:    Glucose-Capillary 235 (*)    All other components within normal limits  GLUCOSE, CAPILLARY - Abnormal; Notable for the following:    Glucose-Capillary 305 (*)    All other components within normal limits  I-STAT CHEM 8, ED - Abnormal; Notable for the following:    BUN 43 (*)    Creatinine, Ser 1.70 (*)    Glucose, Bld 268 (*)    Calcium, Ion 1.02 (*)    Hemoglobin 17.0 (*)    HCT 50.0 (*)    All other components within normal limits  MRSA PCR SCREENING  D-DIMER, QUANTITATIVE  TROPONIN I  TSH  HEMOGLOBIN A1C  TROPONIN I  TROPONIN I  BASIC METABOLIC PANEL  CBC  PROTIME-INR  I-STAT TROPOININ, ED    Imaging Review Dg Chest Port 1 View  04/25/2014   CLINICAL DATA:  Initial encounter. Atrial fibrillation. Near syncope. Weakness.  EXAM: PORTABLE CHEST - 1 VIEW  COMPARISON:  09/09/2010  FINDINGS: The heart size and mediastinal contours are within normal limits. Both lungs are clear. The visualized skeletal structures are unremarkable.  IMPRESSION: No active disease.   Electronically Signed   By: Kathreen Devoid   On: 04/25/2014 14:11     EKG Interpretation   Date/Time:  Tuesday April 25 2014 13:06:23 EST Ventricular Rate:  139 PR Interval:    QRS Duration: 98 QT Interval:  304 QTC Calculation: 462 R Axis:   76 Text Interpretation:  Atrial fibrillation with rapid V-rate Repolarization  abnormality, prob rate related Since last tracing rate faster Confirmed by  [facility]  MD, Kenyette Gundy [id number]) on 04/25/2014 1:13:46 PM        MDM   Final diagnoses:  Paroxysmal atrial fibrillation  Type 2 diabetes  mellitus without complication    Recurrent AF with RVR. Improved with IV  Cardizem bolus, followed by drip. No metabolic instability or suspected infection.She will require admission to Step-down unit.  Nursing Notes Reviewed/ Care Coordinated Applicable Imaging Reviewed Interpretation of Laboratory Data incorporated into ED treatment  Plan: Admit to cardiology.  Richarda Blade, MD 04/25/14 [phone]

## 2014-04-25 NOTE — ED Notes (Signed)
Pt stated that yesterday around 1700 she felt her "heart fluttering" and her blood pressure was high so she took her flecanide prompting her to go to [facility] ED last night.

## 2014-04-25 NOTE — Telephone Encounter (Signed)
New msg    Pt went to ER last night with Afib, racing heart. BP as of  this morning 163/107 HR 121.   Please call [phone]

## 2014-04-25 NOTE — Discharge Instructions (Signed)
Call Dr. Marko Stai noose in the morning for recheck appointment. Return to the ER with shortness of breath chest pain lightheadedness dizziness or other changes in your symptoms.  Atrial Fibrillation Atrial fibrillation is a type of irregular heart rhythm (arrhythmia). During atrial fibrillation, the upper chambers of the heart (atria) quiver continuously in a chaotic pattern. This causes an irregular and often rapid heart rate.  Atrial fibrillation is the result of the heart becoming overloaded with disorganized signals that tell it to beat. These signals are normally released one at a time by a part of the right atrium called the sinoatrial node. They then travel from the atria to the lower chambers of the heart (ventricles), causing the atria and ventricles to contract and pump blood as they pass. In atrial fibrillation, parts of the atria outside of the sinoatrial node also release these signals. This results in two problems. First, the atria receive so many signals that they do not have time to fully contract. Second, the ventricles, which can only receive one signal at a time, beat irregularly and out of rhythm with the atria.  There are three types of atrial fibrillation:   Paroxysmal. Paroxysmal atrial fibrillation starts suddenly and stops on its own within a week.  Persistent. Persistent atrial fibrillation lasts for more than a week. It may stop on its own or with treatment.  Permanent. Permanent atrial fibrillation does not go away. Episodes of atrial fibrillation may lead to permanent atrial fibrillation. Atrial fibrillation can prevent your heart from pumping blood normally. It increases your risk of stroke and can lead to heart failure.  CAUSES   Heart conditions, including a heart attack, heart failure, coronary artery disease, and heart valve conditions.   Inflammation of the sac that surrounds the heart (pericarditis).  Blockage of an artery in the lungs (pulmonary  embolism).  Pneumonia or other infections.  Chronic lung disease.  Thyroid problems, especially if the thyroid is overactive (hyperthyroidism).  Caffeine, excessive alcohol use, and use of some illegal drugs.   Use of some medicines, including certain decongestants and diet pills.  Heart surgery.   Birth defects.  Sometimes, no cause can be found. When this happens, the atrial fibrillation is called lone atrial fibrillation. The risk of complications from atrial fibrillation increases if you have lone atrial fibrillation and you are age 91 years or older. RISK FACTORS  Heart failure.  Coronary artery disease.  Diabetes mellitus.   High blood pressure (hypertension).   Obesity.   Other arrhythmias.   Increased age. SIGNS AND SYMPTOMS   A feeling that your heart is beating rapidly or irregularly.   A feeling of discomfort or pain in your chest.   Shortness of breath.   Sudden light-headedness or weakness.   Getting tired easily when exercising.   Urinating more often than normal (mainly when atrial fibrillation first begins).  In paroxysmal atrial fibrillation, symptoms may start and suddenly stop. DIAGNOSIS  Your health care provider may be able to detect atrial fibrillation when taking your pulse. Your health care provider may have you take a test called an ambulatory electrocardiogram (ECG). An ECG records your heartbeat patterns over a 24-hour period. You may also have other tests, such as:  Transthoracic echocardiogram (TTE). During echocardiography, sound waves are used to evaluate how blood flows through your heart.  Transesophageal echocardiogram (TEE).  Stress test. There is more than one type of stress test. If a stress test is needed, ask your health care provider about which type  is best for you.  Chest X-ray exam.  Blood tests.  Computed tomography (CT). TREATMENT  Treatment may include:  Treating any underlying conditions. For  example, if you have an overactive thyroid, treating the condition may correct atrial fibrillation.  Taking medicine. Medicines may be given to control a rapid heart rate or to prevent blood clots, heart failure, or a stroke.  Having a procedure to correct the rhythm of the heart:  Electrical cardioversion. During electrical cardioversion, a controlled, low-energy shock is delivered to the heart through your skin. If you have chest pain, very low blood pressure, or sudden heart failure, this procedure may need to be done as an emergency.  Catheter ablation. During this procedure, heart tissues that send the signals that cause atrial fibrillation are destroyed.  Surgical ablation. During this surgery, thin lines of heart tissue that carry the abnormal signals are destroyed. This procedure can either be an open-heart surgery or a minimally invasive surgery. With the minimally invasive surgery, small cuts are made to access the heart instead of a large opening.  Pulmonary venous isolation. During this surgery, tissue around the veins that carry blood from the lungs (pulmonary veins) is destroyed. This tissue is thought to carry the abnormal signals. HOME CARE INSTRUCTIONS   Take medicines only as directed by your health care provider. Some medicines can make atrial fibrillation worse or recur.  If blood thinners were prescribed by your health care provider, take them exactly as directed. Too much blood-thinning medicine can cause bleeding. If you take too little, you will not have the needed protection against stroke and other problems.  Perform blood tests at home if directed by your health care provider. Perform blood tests exactly as directed.  Quit smoking if you smoke.  Do not drink alcohol.  Do not drink caffeinated beverages such as coffee, soda, and some teas. You may drink decaffeinated coffee, soda, or tea.   Maintain a healthy weight.Do not use diet pills unless your health care  provider approves. They may make heart problems worse.   Follow diet instructions as directed by your health care provider.  Exercise regularly as directed by your health care provider.  Keep all follow-up visits as directed by your health care provider. This is important. PREVENTION  The following substances can cause atrial fibrillation to recur:   Caffeinated beverages.  Alcohol.  Certain medicines, especially those used for breathing problems.  Certain herbs and herbal medicines, such as those containing ephedra or ginseng.  Illegal drugs, such as cocaine and amphetamines. Sometimes medicines are given to prevent atrial fibrillation from recurring. Proper treatment of any underlying condition is also important in helping prevent recurrence.  SEEK MEDICAL CARE IF:  You notice a change in the rate, rhythm, or strength of your heartbeat.  You suddenly begin urinating more frequently.  You tire more easily when exerting yourself or exercising. SEEK IMMEDIATE MEDICAL CARE IF:   You have chest pain, abdominal pain, sweating, or weakness.  You feel nauseous.  You have shortness of breath.  You suddenly have swollen feet and ankles.  You feel dizzy.  Your face or limbs feel numb or weak.  You have a change in your vision or speech. MAKE SURE YOU:   Understand these instructions.  Will watch your condition.  Will get help right away if you are not doing well or get worse. Document Released: 04/28/2005 Document Revised: 09/12/2013 Document Reviewed: 06/08/2012 [facility] Patient Information 2015 [place], [place]. This information is not intended to  replace advice given to you by your health care provider. Make sure you discuss any questions you have with your health care provider. ° °

## 2014-04-25 NOTE — Progress Notes (Signed)
ANTICOAGULATION CONSULT NOTE - Initial Consult  Pharmacy Consult for Warfarin Indication: atrial fibrillation  Allergies  Allergen Reactions  . Prednisone     Increase blood sugar.    Patient Measurements: Height: 5\' 3"  (160 cm) Weight: 162 lb (73.483 kg) IBW/kg (Calculated) : 52.4  Vital Signs: Temp: 97.8 F (36.6 C) (12/15 1316) Temp Source: Oral (12/15 1316) BP: 197/122 mmHg (12/15 1316) Pulse Rate: 130 (12/15 1316)  Labs:  Recent Labs  04/24/14 2225  HGB 15.6*  HCT 45.8  PLT 220  LABPROT 22.9*  INR 2.00*  CREATININE 1.70*    Estimated Creatinine Clearance: 26.6 mL/min (by C-G formula based on Cr of 1.7).   Medical History: Past Medical History  Diagnosis Date  . Essential hypertension   . Gout   . GERD (gastroesophageal reflux disease)   . Insomnia   . Atrial fibrillation   . Type 2 diabetes mellitus   . Allergic rhinitis   . Hyperlipemia, mixed   . Chronic renal disease, stage IV     Assessment: 69 YOF who presented to the [facility] with recurrent Afib with RVR that was controlled with a dose of diltiazem and the patient was sent home with cardiology follow-up. During the office visit today, the patient became unresponsive, a code blue was called and the patient was sent in for admission.  PTA the patient was on warfarin for anticoagulation in the setting of Afib -  Pharmacy has been consulted to resume this during this inpatient stay. PTA dose was 2.5 mg daily EXCEPT for 5 mg on Mon/Wed. Admit INR therapeutic at 2.14 - the patient's last dose was on 12/14.  Goal of Therapy:  INR 2-3 Monitor platelets by anticoagulation protocol: Yes   Plan:  1. Warfarin 2.5 mg x 1 dose at 1800 today 2. Daily PT/INR 3. Will continue to monitor for any signs/symptoms of bleeding and will follow up with PT/INR in the a.m.   Alycia Rossetti, PharmD, BCPS Clinical Pharmacist Pager: [phone] 04/25/2014 1:32 PM

## 2014-04-25 NOTE — Progress Notes (Addendum)
Cardiology Office Note    Date:  04/25/2014   ID:  Kathryn Richardson, DOB 1935/02/10, MRN [id number]  PCP:  Gara Kroner, MD  Cardiologist: Dr. Irish Lack  Electrophysiologist:  Dr. Rayann Heman   History of Present Illness: Kathryn Richardson is a 79 y.o. female with a history of HTN, DM, gout, GERD, PAF on flecainide and warfarin, bradycardia, HLD, T2DM, HLD and CKD who was added to our office schedule for evaluation of recurrent atrial fibrillation/fluter.   She has a long history of PAF (diagnosed in 2010) that was initially well controlled on Flecanide. She has had previous ischemic work up including a heart cath in 2012 with no CAD. However, she developed some symptomatic bradycardia so Dr Rayann Heman attempted to wean her off. She developed recurrent atrial fibrillation and was resumed on a lower dose of 50mg  BID in 2012 and had done relatively well since that time on the 50mg  and pill and the pocket approach. Over the past couple years she has had to take an extra flecainide for palpitations only a few times. She was in her usual state of health until yesterday when she just wasn't feeling well. She woke up from a nap with sudden onset of palpitations and fluttering in her chest.  She presented to the [facility] ED and given 1 dose of IV Cardizem which brought her HRs to 75-95 bpm. She was asymptomatic and so it was decided to send her home with close outpatient cardiology follow up. She presented to the office today and ECG revealed atrial flutter with RVR HR 122 and she was feeling okay.  During our office visit the patient suddenly became diaphoretic and unresponsive. A CODE blue was called. Her SBP in 140s and BG 264. Stat tele with HR 120s in atrial flutter. EMS was called and she will be transported to [facility] for direct admission through the ED.   Recent Labs/Images:   Recent Labs  04/24/14 2225  NA 141  K 4.2  BUN 49*  CREATININE 1.70*  HGB 15.6*     No results found.   Wt Readings from Last  3 Encounters:  04/25/14 163 lb (73.936 kg)  10/05/13 164 lb (74.39 kg)  07/20/13 164 lb 1.6 oz (74.435 kg)     Past Medical History  Diagnosis Date  . Essential hypertension   . Gout   . GERD (gastroesophageal reflux disease)   . Insomnia   . Atrial fibrillation   . Type 2 diabetes mellitus   . Allergic rhinitis   . Hyperlipemia, mixed   . Chronic renal disease, stage IV     Current Outpatient Prescriptions  Medication Sig Dispense Refill  . atorvastatin (LIPITOR) 80 MG tablet Take 80 mg by mouth daily.    . benazepril (LOTENSIN) 20 MG tablet Take 20 mg by mouth daily.    . flecainide (TAMBOCOR) 50 MG tablet TAKE 1 TABLET BY MOUTH EVERY 12 HOURS 60 tablet 11  . furosemide (LASIX) 40 MG tablet Take 40 mg by mouth daily.    . hydrALAZINE (APRESOLINE) 25 MG tablet Take 25 mg by mouth 3 (three) times daily.    Marland Kitchen KLOR-CON M20 20 MEQ tablet Take 20 mEq by mouth daily.  3  . levothyroxine (SYNTHROID, LEVOTHROID) 50 MCG tablet Take 50 mcg by mouth daily. On a empty stomach.    . linagliptin (TRADJENTA) 5 MG TABS tablet Take 5 mg by mouth daily.    . Potassium Chloride Crys CR (KLOR-CON M20 PO) Take 20 mEq  by mouth daily.    . predniSONE (DELTASONE) 20 MG tablet Take 20 mg by mouth daily with breakfast. 9 Day Taper. Take 3 tablets Daily For 3 Days, Take 2 tablets Daily for 3 Days, Take 1 tablet Daily for 3 Days.    . repaglinide (PRANDIN) 0.5 MG tablet Take 0.5 mg by mouth 2 (two) times daily before a meal.    . warfarin (COUMADIN) 5 MG tablet Take as directed by coumadin clinic (Patient taking differently: Take 2.5-5 mg by mouth daily. Take 1 tablet (5 mg) on (Mon, Wed) & Take 1/2 tablet (2.5 mg) on (Tues, Thurs, Fri, Sat & Sun).) 30 tablet 3   No current facility-administered medications for this visit.     Allergies:   Prednisone   Social History:  The patient  reports that she has never smoked. She does not have any smokeless tobacco history on file. She reports that she does not  drink alcohol or use illicit drugs.   Family History:  The patient's family history includes Coronary artery disease (age of onset: 18) in her father.   ROS:  Please see the history of present illness. All other systems reviewed and negative.    PHYSICAL EXAM: VS:  BP 132/70 mmHg  Pulse 122  Ht 5\' 1"  (1.549 m)  Wt 163 lb (73.936 kg)  BMI 30.81 kg/m2 Well nourished, well developed, in no acute distress HEENT: normal Neck: no JVD Cardiac:  normal S1, S2; tachycardic RRR; no murmur Lungs:  clear to auscultation bilaterally, no wheezing, rhonchi or rales Abd: soft, nontender, no hepatomegaly Ext: no edema Skin: warm and dry Neuro:  CNs 2-12 intact, no focal abnormalities noted  EKG:  Atrial flutter with RVR and diffuse ST abnormality      ASSESSMENT AND PLAN:   Kathryn Richardson is a 79 y.o. female with a history of HTN, DM, gout, GERD, PAF on flecainide and warfarin, bradycardia, HLD, T2DM, HLD and CKD who was added to our office schedule for evaluation of recurrent atrial fibrillation/fluter. She presented to the office today and ECG revealed atrial flutter with RVR HR 122 and she was feeling okay.  During our office visit the patient suddenly became diaphoretic and unresponsive. Her SBP in 140s and BG 264. Stat tele with HR 120s in atrial flutter.  EMS was called and she will be transported to [facility] for direct admission through the ED.  Patient quickly had return of consciousness and denies any complaints except lightheadedness and diaphoresis.  She denies any chest pain.    Atrial fibrillation/flutter- HR currently 120s -- Will placed on IV cardizem -- Continue Flecainide 50mg  BID and coumadin. INR has been therapeutic if she requires a DCCV. -- History of bradycardia on higher doses of Flecainde.  -- Will place on EP rounds for further evaluation -- Orders have been placed for admission.  HLD- continue statin   HTN- BP well controlled in the office.  -- Continue  Benazepril  CKD- creat 1.7 yesterday in ED. Continue to monitor  Disposition: Being transferred to [facility] for admission.   Signed, Joen Laura, MHS 04/25/2014 11:30 AM    [facility] [address], [place], [place]  [address] Phone: [phone]; Fax: [phone]   I have personally interviewed, seen and examined patient myself.  Added on to schedule today due to palpitations.  She was seen in the ER last night and given IV Cardizem for atrial flutter with RVR.  Her HR slowed and  she was sent home and instructed to followup in the office in the am.  On presentation to the office she was feeling fine except for feeling her HR racing.  She denied any chest pain, SOB, DOE, LE edema.  EKG showed atrial flutter with RVR.  Apparently she has been seen by Dr. Rayann Heman and has not been on AV nodal blocking agents due to history of bradycardia.  She has a history of ST depression in the past as well with normal cath in 2012.  In office today she became diaphoretic and dizzy and her husband says that her eyes rolled back in her head and she transiently has LOC.  A code blue was called and the patient was placed supine and quickly regained consciousness.  She does not complain of anything.  Tele on monitor showed atrial flutter at 124 bpm.  SBP in the 140's.  BS was 200.  EMS was called and patient will be transported to [facility] ER for further treatment.  Will start on IV Cardizem gtt and continue Flecainide.  Will place on EP service for further evaluation.    Signed: Fransico Him, MD [facility] 04/25/2014

## 2014-04-25 NOTE — ED Notes (Signed)
Pt complains of weakness/dizziness. Pt was seen at [facility] ED last night for asymptomatic a-fib. Pt denies shortness of breath. Pt takes flicanide 50mg  twice per day for a-fib. Pt was at dr's office this afternoon and had a witnessed syncopal episode of 15 seconds while sitting on a bench.

## 2014-04-25 NOTE — H&P (Signed)
Expand All Collapse All      Cardiology Office Note    Date: 04/25/2014   ID: Kathryn Richardson, DOB 09-22-1934, MRN [id number]  PCP: Gara Kroner, MD Cardiologist: Dr. Irish Lack Electrophysiologist: Dr. Rayann Heman  History of Present Illness: Kathryn Richardson is a 79 y.o. female with a history of HTN, DM, gout, GERD, PAF on flecainide and warfarin, bradycardia, HLD, T2DM, HLD and CKD who was added to our office schedule for evaluation of recurrent atrial fibrillation/fluter.   She has a long history of PAF (diagnosed in 2010) that was initially well controlled on Flecanide. She has had previous ischemic work up including a heart cath in 2012 with no CAD. However, she developed some symptomatic bradycardia so Dr Rayann Heman attempted to wean her off. She developed recurrent atrial fibrillation and was resumed on a lower dose of 50mg  BID in 2012 and had done relatively well since that time on the 50mg  and pill and the pocket approach. Over the past couple years she has had to take an extra flecainide for palpitations only a few times. She was in her usual state of health until yesterday when she just wasn't feeling well. She woke up from a nap with sudden onset of palpitations and fluttering in her chest.  She presented to the [facility] ED and given 1 dose of IV Cardizem which brought her HRs to 75-95 bpm. She was asymptomatic and so it was decided to send her home with close outpatient cardiology follow up. She presented to the office today and ECG revealed atrial flutter with RVR HR 122 and she was feeling okay. During our office visit the patient suddenly became diaphoretic and unresponsive. A CODE blue was called. Her SBP in 140s and BG 264. Stat tele with HR 120s in atrial flutter. EMS was called and she will be transported to [facility] for direct admission through the ED.   Recent Labs/Images:   Recent Labs (within last 365 days)     Recent Labs  04/24/14 2225  NA 141  K 4.2    BUN 49*  CREATININE 1.70*  HGB 15.6*       No results found.   Wt Readings from Last 3 Encounters:  04/25/14 163 lb (73.936 kg)  10/05/13 164 lb (74.39 kg)  07/20/13 164 lb 1.6 oz (74.435 kg)     Past Medical History  Diagnosis Date  . Essential hypertension   . Gout   . GERD (gastroesophageal reflux disease)   . Insomnia   . Atrial fibrillation   . Type 2 diabetes mellitus   . Allergic rhinitis   . Hyperlipemia, mixed   . Chronic renal disease, stage IV     Current Outpatient Prescriptions  Medication Sig Dispense Refill  . atorvastatin (LIPITOR) 80 MG tablet Take 80 mg by mouth daily.    . benazepril (LOTENSIN) 20 MG tablet Take 20 mg by mouth daily.    . flecainide (TAMBOCOR) 50 MG tablet TAKE 1 TABLET BY MOUTH EVERY 12 HOURS 60 tablet 11  . furosemide (LASIX) 40 MG tablet Take 40 mg by mouth daily.    . hydrALAZINE (APRESOLINE) 25 MG tablet Take 25 mg by mouth 3 (three) times daily.    Marland Kitchen KLOR-CON M20 20 MEQ tablet Take 20 mEq by mouth daily.  3  . levothyroxine (SYNTHROID, LEVOTHROID) 50 MCG tablet Take 50 mcg by mouth daily. On a empty stomach.    . linagliptin (TRADJENTA) 5 MG TABS tablet Take 5 mg by mouth daily.    Marland Kitchen  Potassium Chloride Crys CR (KLOR-CON M20 PO) Take 20 mEq by mouth daily.    . predniSONE (DELTASONE) 20 MG tablet Take 20 mg by mouth daily with breakfast. 9 Day Taper. Take 3 tablets Daily For 3 Days, Take 2 tablets Daily for 3 Days, Take 1 tablet Daily for 3 Days.    . repaglinide (PRANDIN) 0.5 MG tablet Take 0.5 mg by mouth 2 (two) times daily before a meal.    . warfarin (COUMADIN) 5 MG tablet Take as directed by coumadin clinic (Patient taking differently: Take 2.5-5 mg by mouth daily. Take 1 tablet (5 mg) on (Mon, Wed) & Take 1/2 tablet (2.5 mg) on (Tues, Thurs, Fri, Sat & Sun).) 30 tablet 3   No current facility-administered  medications for this visit.     Allergies: Prednisone   Social History: The patient  reports that she has never smoked. She does not have any smokeless tobacco history on file. She reports that she does not drink alcohol or use illicit drugs.   Family History: The patient's family history includes Coronary artery disease (age of onset: 73) in her father.   ROS: Please see the history of present illness. All other systems reviewed and negative.    PHYSICAL EXAM: VS: BP 132/70 mmHg  Pulse 122  Ht 5\' 1"  (1.549 m)  Wt 163 lb (73.936 kg)  BMI 30.81 kg/m2 Well nourished, well developed, in no acute distress  HEENT: normal  Neck: no JVD  Cardiac: normal S1, S2; tachycardic RRR; no murmur  Lungs: clear to auscultation bilaterally, no wheezing, rhonchi or rales  Abd: soft, nontender, no hepatomegaly  Ext: no edema  Skin: warm and dry  Neuro: CNs 2-12 intact, no focal abnormalities noted  EKG: Atrial flutter with RVR and diffuse ST abnormality    ASSESSMENT AND PLAN:   Kathryn Richardson is a 78 y.o. female with a history of HTN, DM, gout, GERD, PAF on flecainide and warfarin, bradycardia, HLD, T2DM, HLD and CKD who was added to our office schedule for evaluation of recurrent atrial fibrillation/fluter. She presented to the office today and ECG revealed atrial flutter with RVR HR 122 and she was feeling okay. During our office visit the patient suddenly became diaphoretic and unresponsive. Her SBP in 140s and BG 264. Stat tele with HR 120s in atrial flutter.  EMS was called and she will be transported to [facility] for direct admission through the ED. Patient quickly had return of consciousness and denies any complaints except lightheadedness and diaphoresis. She denies any chest pain.   Atrial fibrillation/flutter- HR currently 120s -- Will placed on IV cardizem -- Continue Flecainide 50mg  BID and coumadin. INR has been therapeutic if she requires a DCCV. -- History of  bradycardia on higher doses of Flecainde.  -- Will place on EP rounds for further evaluation -- Orders have been placed for admission.  HLD- continue statin   HTN- BP well controlled in the office.  -- Continue Benazepril  CKD- creat 1.7 yesterday in ED. Continue to monitor  Disposition: Being transferred to [facility] for admission.   Signed, Joen Laura, MHS 04/25/2014 11:30 AM  [facility] [place], [place], [place] [address] Phone: [phone]; Fax: [phone]   I have personally interviewed, seen and examined patient myself. Added on to schedule today due to palpitations. She was seen in the ER last night and given IV Cardizem for atrial flutter with RVR. Her HR slowed and she was sent home  and instructed to followup in the office in the am. On presentation to the office she was feeling fine except for feeling her HR racing. She denied any chest pain, SOB, DOE, LE edema. EKG showed atrial flutter with RVR. Apparently she has been seen by Dr. Rayann Heman and has not been on AV nodal blocking agents due to history of bradycardia. She has a history of ST depression in the past as well with normal cath in 2012. In office today she became diaphoretic and dizzy and her husband says that her eyes rolled back in her head and she transiently has LOC. A code blue was called and the patient was placed supine and quickly regained consciousness. She does not complain of anything. Tele on monitor showed atrial flutter at 124 bpm. SBP in the 140's. BS was 200. EMS was called and patient will be transported to [facility] ER for further treatment. Will start on IV Cardizem gtt and continue Flecainide. Will place on EP service for further evaluation.   Signed: Fransico Him, MD [facility] 04/25/2014

## 2014-04-25 NOTE — Telephone Encounter (Signed)
I spoke with the patient. She was in the ER last night for reported a-fib. She does have a history of this and has been treated with flecainide. She states that she felt her heart go out of rhythm about 4/ 5 pm yesterday. She went to the ER for evaluation and HR's on arrival were 121 bpm. She was given IV cardizem and rates were 75-95 around the time of discharge. The patient states she was released about 1 am. She took her BP and HR around 8:30 am this morning and she was 163/107 HR-121. The patient is asymptomatic currently, she can just tell she is out of rhythm. Discussed with Dr. Radford Pax (DOD)- EKG reviewed from the ER- possibly a-flutter. The patient will need to be seen today for evaluation. She is scheduled at 11:00 am with Angelena Form, PA for evaluation. She has not had any medications this morning. I advised her to go ahead and take her regular flecainide 50 mg dose now. She is agreeable with being seen at 11:00 am.

## 2014-04-26 ENCOUNTER — Encounter: Payer: Medicare Other | Admitting: Internal Medicine

## 2014-04-26 ENCOUNTER — Encounter (HOSPITAL_COMMUNITY)
Admission: EM | Disposition: A | Discharge status: Home / Self Care | Payer: Self-pay | Source: Home / Self Care | Attending: Cardiology

## 2014-04-26 ENCOUNTER — Encounter (HOSPITAL_COMMUNITY): Payer: Self-pay | Admitting: Internal Medicine

## 2014-04-26 DIAGNOSIS — R55 Syncope and collapse: Secondary | ICD-10-CM | POA: Diagnosis not present

## 2014-04-26 DIAGNOSIS — Z7952 Long term (current) use of systemic steroids: Secondary | ICD-10-CM | POA: Diagnosis not present

## 2014-04-26 DIAGNOSIS — Z8249 Family history of ischemic heart disease and other diseases of the circulatory system: Secondary | ICD-10-CM | POA: Diagnosis not present

## 2014-04-26 DIAGNOSIS — I480 Paroxysmal atrial fibrillation: Secondary | ICD-10-CM

## 2014-04-26 DIAGNOSIS — I4891 Unspecified atrial fibrillation: Secondary | ICD-10-CM | POA: Diagnosis present

## 2014-04-26 DIAGNOSIS — I4892 Unspecified atrial flutter: Secondary | ICD-10-CM | POA: Diagnosis not present

## 2014-04-26 DIAGNOSIS — E119 Type 2 diabetes mellitus without complications: Secondary | ICD-10-CM | POA: Diagnosis not present

## 2014-04-26 DIAGNOSIS — M109 Gout, unspecified: Secondary | ICD-10-CM | POA: Diagnosis present

## 2014-04-26 DIAGNOSIS — R918 Other nonspecific abnormal finding of lung field: Secondary | ICD-10-CM | POA: Diagnosis not present

## 2014-04-26 DIAGNOSIS — I495 Sick sinus syndrome: Secondary | ICD-10-CM | POA: Diagnosis not present

## 2014-04-26 DIAGNOSIS — I129 Hypertensive chronic kidney disease with stage 1 through stage 4 chronic kidney disease, or unspecified chronic kidney disease: Secondary | ICD-10-CM | POA: Diagnosis present

## 2014-04-26 DIAGNOSIS — N184 Chronic kidney disease, stage 4 (severe): Secondary | ICD-10-CM | POA: Diagnosis not present

## 2014-04-26 DIAGNOSIS — R001 Bradycardia, unspecified: Secondary | ICD-10-CM | POA: Diagnosis not present

## 2014-04-26 DIAGNOSIS — E782 Mixed hyperlipidemia: Secondary | ICD-10-CM | POA: Diagnosis present

## 2014-04-26 DIAGNOSIS — Z950 Presence of cardiac pacemaker: Secondary | ICD-10-CM | POA: Diagnosis not present

## 2014-04-26 DIAGNOSIS — K219 Gastro-esophageal reflux disease without esophagitis: Secondary | ICD-10-CM | POA: Diagnosis present

## 2014-04-26 DIAGNOSIS — Z7901 Long term (current) use of anticoagulants: Secondary | ICD-10-CM | POA: Diagnosis not present

## 2014-04-26 HISTORY — PX: PERMANENT PACEMAKER INSERTION: SHX5480

## 2014-04-26 LAB — GLUCOSE, CAPILLARY
GLUCOSE-CAPILLARY: 213 mg/dL — AB (ref 70–99)
GLUCOSE-CAPILLARY: 207 mg/dL — AB (ref 70–99)
GLUCOSE-CAPILLARY: 137 mg/dL — AB (ref 70–99)
Glucose-Capillary: 151 mg/dL — ABNORMAL HIGH (ref 70–99)

## 2014-04-26 LAB — CBC
HEMATOCRIT: 41.5 % (ref 36.0–46.0)
Hemoglobin: 14.3 g/dL (ref 12.0–15.0)
MCH: 32 pg (ref 26.0–34.0)
MCHC: 34.5 g/dL (ref 30.0–36.0)
MCV: 92.8 fL (ref 78.0–100.0)
PLATELETS: 203 10*3/uL (ref 150–400)
RBC: 4.47 MIL/uL (ref 3.87–5.11)
RDW: 13.1 % (ref 11.5–15.5)
WBC: 8.1 10*3/uL (ref 4.0–10.5)

## 2014-04-26 LAB — BASIC METABOLIC PANEL
ANION GAP: 15 (ref 5–15)
BUN: 39 mg/dL — ABNORMAL HIGH (ref 6–23)
CALCIUM: 8.8 mg/dL (ref 8.4–10.5)
CO2: 23 meq/L (ref 19–32)
Chloride: 102 mEq/L (ref 96–112)
Creatinine, Ser: 1.78 mg/dL — ABNORMAL HIGH (ref 0.50–1.10)
GFR, EST AFRICAN AMERICAN: 31 mL/min — AB (ref >90)
GFR, EST NON AFRICAN AMERICAN: 26 mL/min — AB (ref >90)
Glucose, Bld: 221 mg/dL — ABNORMAL HIGH (ref 70–99)
Potassium: 4 mEq/L (ref 3.7–5.3)
SODIUM: 140 meq/L (ref 137–147)

## 2014-04-26 LAB — TROPONIN I

## 2014-04-26 LAB — PROTIME-INR
INR: 2.17 — AB (ref 0.00–1.49)
PROTHROMBIN TIME: 24.4 s — AB (ref 11.6–15.2)

## 2014-04-26 SURGERY — PERMANENT PACEMAKER INSERTION
Anesthesia: LOCAL

## 2014-04-26 MED ORDER — FLECAINIDE ACETATE 100 MG PO TABS
100.0000 mg | ORAL_TABLET | Freq: Two times a day (BID) | ORAL | Status: DC
  Administered 2014-04-26 – 2014-04-27 (×2): 100 mg via ORAL
  Filled 2014-04-26 (×3): qty 1

## 2014-04-26 MED ORDER — LIDOCAINE HCL (PF) 1 % IJ SOLN
INTRAMUSCULAR | Status: AC
  Filled 2014-04-26: qty 60

## 2014-04-26 MED ORDER — MIDAZOLAM HCL 5 MG/5ML IJ SOLN
INTRAMUSCULAR | Status: AC
  Filled 2014-04-26: qty 5

## 2014-04-26 MED ORDER — CHLORHEXIDINE GLUCONATE 4 % EX LIQD
60.0000 mL | Freq: Once | CUTANEOUS | Status: DC
  Filled 2014-04-26: qty 15

## 2014-04-26 MED ORDER — GENTAMICIN SULFATE 40 MG/ML IJ SOLN
80.0000 mg | INTRAMUSCULAR | Status: DC
  Filled 2014-04-26: qty 2

## 2014-04-26 MED ORDER — ONDANSETRON HCL 4 MG/2ML IJ SOLN: 4.0000 mg | Freq: Four times a day (QID) | INTRAMUSCULAR | Status: DC | PRN

## 2014-04-26 MED ORDER — CEFAZOLIN SODIUM-DEXTROSE 2-3 GM-% IV SOLR
2.0000 g | INTRAVENOUS | Status: DC
  Filled 2014-04-26: qty 50

## 2014-04-26 MED ORDER — CEFAZOLIN SODIUM 1-5 GM-% IV SOLN
1.0000 g | Freq: Two times a day (BID) | INTRAVENOUS | Status: AC
  Administered 2014-04-26 – 2014-04-27 (×2): 1 g via INTRAVENOUS
  Filled 2014-04-26 (×2): qty 50

## 2014-04-26 MED ORDER — ACETAMINOPHEN 325 MG PO TABS
325.0000 mg | ORAL_TABLET | ORAL | Status: DC | PRN
Start: 2014-04-26 — End: 2014-04-27

## 2014-04-26 MED ORDER — SODIUM CHLORIDE 0.9 % IV SOLN
INTRAVENOUS | Status: DC
  Administered 2014-04-26: 14:00:00 via INTRAVENOUS

## 2014-04-26 MED ORDER — HEPARIN (PORCINE) IN NACL 2-0.9 UNIT/ML-% IJ SOLN
INTRAMUSCULAR | Status: AC
  Filled 2014-04-26: qty 500

## 2014-04-26 MED ORDER — DIAZEPAM 5 MG PO TABS
5.0000 mg | ORAL_TABLET | ORAL | Status: AC
  Administered 2014-04-26: 5 mg via ORAL
  Filled 2014-04-26: qty 1

## 2014-04-26 MED ORDER — FENTANYL CITRATE 0.05 MG/ML IJ SOLN
INTRAMUSCULAR | Status: AC
  Filled 2014-04-26: qty 2

## 2014-04-26 MED ORDER — WARFARIN SODIUM 5 MG PO TABS
5.0000 mg | ORAL_TABLET | Freq: Once | ORAL | Status: AC
  Administered 2014-04-26: 5 mg via ORAL
  Filled 2014-04-26: qty 1

## 2014-04-26 NOTE — Consult Note (Addendum)
Reason for Consult:   Syncope, recurrent atrial fibrillation, h/o sinus node dysfunction and tachybrady syndrome  Requesting Physician: Dr Radford Pax  HPI: This is a 79 y.o. female with a past medical history significant for PAF since 2010 on Coumadin, and Flecainide since 2011. Cath in 2012 showed normal coronaries and normal LVF. She has baseline bradycardia when in NSR and her Flecainide had been cut back about two years ago after she presented with symptomatic sinus bradycardia and heart rates in the 30/min range on flecainide 100 mg twice daily. Her dose was reduced and she has had recurrent atrial fib and flutter. She has done well until this past Monday when she had recurrent atrial fibrillation with RVR and went to [facility] ED. She was given IV Diltiazem for rate control and discharged with instructions to f/u in the office. On 04/25/14 she was in the office and apparently had a syncopal spell. Her EKG in the office showed  Atrial flutter per Dr Radford Pax- rate 122. She was admitted and has been stable since admission in atrial fibrillation with CVR on IV Diltiazem. We are asked to provide addditional rec's.  PMHx:  Past Medical History  Diagnosis Date  . Essential hypertension   . Gout   . GERD (gastroesophageal reflux disease)   . Insomnia   . Atrial fibrillation   . Type 2 diabetes mellitus   . Allergic rhinitis   . Hyperlipemia, mixed   . Chronic renal disease, stage IV     Past Surgical History  Procedure Laterality Date  . Cesarean section      x2  . Esophageal dilation      SOCHx:  reports that she has never smoked. She does not have any smokeless tobacco history on file. She reports that she does not drink alcohol or use illicit drugs.  FAMHx: Family History  Problem Relation Age of Onset  . Coronary artery disease Father 35    ALLERGIES: Allergies  Allergen Reactions  . Prednisone     Increase blood sugar.    ROS: Pertinent items are noted in  HPI. See H&P for complete ROS. She had a recent gout attack and was placed on a steroid dose pack. She denies any OTC medications.   HOME MEDICATIONS: Prior to Admission medications   Medication Sig Start Date End Date Taking? Authorizing Provider  atorvastatin (LIPITOR) 80 MG tablet Take 80 mg by mouth daily.   Yes Historical Provider, MD  benazepril (LOTENSIN) 20 MG tablet Take 20 mg by mouth daily.   Yes Historical Provider, MD  flecainide (TAMBOCOR) 50 MG tablet TAKE 1 TABLET BY MOUTH EVERY 12 HOURS 10/06/13  Yes Jettie Booze, MD  furosemide (LASIX) 40 MG tablet Take 40 mg by mouth daily.   Yes Historical Provider, MD  levothyroxine (SYNTHROID, LEVOTHROID) 50 MCG tablet Take 50 mcg by mouth daily. On a empty stomach.   Yes Historical Provider, MD  linagliptin (TRADJENTA) 5 MG TABS tablet Take 5 mg by mouth daily.   Yes Historical Provider, MD  Potassium Chloride Crys CR (KLOR-CON M20 PO) Take 20 mEq by mouth daily.   Yes Historical Provider, MD  predniSONE (DELTASONE) 20 MG tablet Take 20 mg by mouth daily with breakfast. 9 Day Taper. Take 3 tablets Daily For 3 Days, Take 2 tablets Daily for 3 Days, Take 1 tablet Daily for 3 Days.   Yes Historical Provider, MD  repaglinide (PRANDIN) 0.5 MG tablet Take 0.5 mg by mouth  2 (two) times daily before a meal.   Yes Historical Provider, MD  warfarin (COUMADIN) 5 MG tablet Take as directed by coumadin clinic Patient taking differently: Take 2.5-5 mg by mouth daily. Take 1 tablet (5 mg) on (Mon, Wed) & Take 1/2 tablet (2.5 mg) on (Tues, Thurs, Fri, Sat & Sun). 12/22/13  Yes Jettie Booze, MD  hydrALAZINE (APRESOLINE) 25 MG tablet Take 25 mg by mouth 3 (three) times daily. 04/24/14   Historical Provider, MD  KLOR-CON M20 20 MEQ tablet Take 20 mEq by mouth daily. 04/09/14   Historical Provider, MD    HOSPITAL MEDICATIONS: I have reviewed the patient's current medications.  VITALS: Blood pressure 123/60, pulse 77, temperature 98.2 F (36.8  C), temperature source Oral, resp. rate 16, height 5\' 3"  (1.6 m), weight 163 lb (73.936 kg), SpO2 97 %.  PHYSICAL EXAM: General appearance: alert, cooperative and no distress Neck: no carotid bruit and no JVD Lungs: clear to auscultation bilaterally Heart: irregularly irregular rhythm Abdomen: soft, non-tender; bowel sounds normal; no masses,  no organomegaly and mid line surgical scar from past C-section Extremities: extremities normal, atraumatic, no cyanosis or edema Pulses: 2+ and symmetric Skin: Skin color, texture, turgor normal. No rashes or lesions Neurologic: Grossly normal  LABS: Results for orders placed or performed during the hospital encounter of 04/25/14 (from the past 24 hour(s))  CBC with Differential     Status: Abnormal   Collection Time: 04/25/14  1:49 PM  Result Value Ref Range   WBC 10.3 4.0 - 10.5 K/uL   RBC 5.14 (H) 3.87 - 5.11 MIL/uL   Hemoglobin 16.3 (H) 12.0 - 15.0 g/dL   HCT 47.7 (H) 36.0 - 46.0 %   MCV 92.8 78.0 - 100.0 fL   MCH 31.7 26.0 - 34.0 pg   MCHC 34.2 30.0 - 36.0 g/dL   RDW 13.0 11.5 - 15.5 %   Platelets 197 150 - 400 K/uL   Neutrophils Relative % 75 43 - 77 %   Neutro Abs 7.6 1.7 - 7.7 K/uL   Lymphocytes Relative 14 12 - 46 %   Lymphs Abs 1.5 0.7 - 4.0 K/uL   Monocytes Relative 11 3 - 12 %   Monocytes Absolute 1.1 (H) 0.1 - 1.0 K/uL   Eosinophils Relative 0 0 - 5 %   Eosinophils Absolute 0.0 0.0 - 0.7 K/uL   Basophils Relative 0 0 - 1 %   Basophils Absolute 0.0 0.0 - 0.1 K/uL  Protime-INR     Status: Abnormal   Collection Time: 04/25/14  1:49 PM  Result Value Ref Range   Prothrombin Time 24.1 (H) 11.6 - 15.2 seconds   INR 2.14 (H) 0.00 - 1.49  I-stat troponin, ED     Status: None   Collection Time: 04/25/14  1:58 PM  Result Value Ref Range   Troponin i, poc 0.03 0.00 - 0.08 ng/mL   Comment 3          I-stat Chem 8, ED     Status: Abnormal   Collection Time: 04/25/14  2:00 PM  Result Value Ref Range   Sodium 138 137 - 147 mEq/L    Potassium 4.6 3.7 - 5.3 mEq/L   Chloride 105 96 - 112 mEq/L   BUN 43 (H) 6 - 23 mg/dL   Creatinine, Ser 1.70 (H) 0.50 - 1.10 mg/dL   Glucose, Bld 268 (H) 70 - 99 mg/dL   Calcium, Ion 1.02 (L) 1.13 - 1.30 mmol/L   TCO2 23  0 - 100 mmol/L   Hemoglobin 17.0 (H) 12.0 - 15.0 g/dL   HCT 50.0 (H) 36.0 - 46.0 %  D-dimer, quantitative     Status: None   Collection Time: 04/25/14  3:30 PM  Result Value Ref Range   D-Dimer, Quant <0.27 0.00 - 0.48 ug/mL-FEU  Hemoglobin A1c     Status: Abnormal   Collection Time: 04/25/14  3:30 PM  Result Value Ref Range   Hgb A1c MFr Bld 7.6 (H) <5.7 %   Mean Plasma Glucose 171 (H) <117 mg/dL  Pro b natriuretic peptide (BNP)     Status: Abnormal   Collection Time: 04/25/14  3:30 PM  Result Value Ref Range   Pro B Natriuretic peptide (BNP) 4716.0 (H) 0 - 450 pg/mL  Troponin I-(serum)     Status: None   Collection Time: 04/25/14  3:30 PM  Result Value Ref Range   Troponin I <0.30 <0.30 ng/mL  TSH     Status: None   Collection Time: 04/25/14  3:30 PM  Result Value Ref Range   TSH 1.230 0.350 - 4.500 uIU/mL  Glucose, capillary     Status: Abnormal   Collection Time: 04/25/14  4:56 PM  Result Value Ref Range   Glucose-Capillary 235 (H) 70 - 99 mg/dL  MRSA PCR Screening     Status: None   Collection Time: 04/25/14  6:15 PM  Result Value Ref Range   MRSA by PCR NEGATIVE NEGATIVE  Glucose, capillary     Status: Abnormal   Collection Time: 04/25/14  7:33 PM  Result Value Ref Range   Glucose-Capillary 305 (H) 70 - 99 mg/dL  Troponin I-(serum)     Status: None   Collection Time: 04/25/14  8:43 PM  Result Value Ref Range   Troponin I <0.30 <0.30 ng/mL  Troponin I-(serum)     Status: None   Collection Time: 04/26/14  2:35 AM  Result Value Ref Range   Troponin I <0.30 <0.30 ng/mL  Basic metabolic panel     Status: Abnormal   Collection Time: 04/26/14  2:35 AM  Result Value Ref Range   Sodium 140 137 - 147 mEq/L   Potassium 4.0 3.7 - 5.3 mEq/L    Chloride 102 96 - 112 mEq/L   CO2 23 19 - 32 mEq/L   Glucose, Bld 221 (H) 70 - 99 mg/dL   BUN 39 (H) 6 - 23 mg/dL   Creatinine, Ser 1.78 (H) 0.50 - 1.10 mg/dL   Calcium 8.8 8.4 - 10.5 mg/dL   GFR calc non Af Amer 26 (L) >90 mL/min   GFR calc Af Amer 31 (L) >90 mL/min   Anion gap 15 5 - 15  CBC     Status: None   Collection Time: 04/26/14  2:35 AM  Result Value Ref Range   WBC 8.1 4.0 - 10.5 K/uL   RBC 4.47 3.87 - 5.11 MIL/uL   Hemoglobin 14.3 12.0 - 15.0 g/dL   HCT 41.5 36.0 - 46.0 %   MCV 92.8 78.0 - 100.0 fL   MCH 32.0 26.0 - 34.0 pg   MCHC 34.5 30.0 - 36.0 g/dL   RDW 13.1 11.5 - 15.5 %   Platelets 203 150 - 400 K/uL  Protime-INR     Status: Abnormal   Collection Time: 04/26/14  2:35 AM  Result Value Ref Range   Prothrombin Time 24.4 (H) 11.6 - 15.2 seconds   INR 2.17 (H) 0.00 - 1.49  Glucose, capillary  Status: Abnormal   Collection Time: 04/26/14  7:43 AM  Result Value Ref Range   Glucose-Capillary 213 (H) 70 - 99 mg/dL    EKG: Atrial fibrillation with rapid VR  IMAGING: Dg Chest Port 1 View  04/25/2014   CLINICAL DATA:  Initial encounter. Atrial fibrillation. Near syncope. Weakness.  EXAM: PORTABLE CHEST - 1 VIEW  COMPARISON:  09/09/2010  FINDINGS: The heart size and mediastinal contours are within normal limits. Both lungs are clear. The visualized skeletal structures are unremarkable.  IMPRESSION: No active disease.   Electronically Signed   By: Kathreen Devoid   On: 04/25/2014 14:11    IMPRESSION: Principal Problem:   Syncope Active Problems:   Atrial flutter with rapid ventricular response   Paroxysmal atrial fibrillation   Bradycardia   Chronic kidney disease, stage III (moderate)   Essential hypertension, benign   Long term current use of anticoagulant therapy   Mixed hyperlipidemia   Acute gout- on steroid dose pack sinus node dysfunction  RECOMMENDATION: I suspect her syncope was either 1:1 atrial flutter or transient asystole secondary to a post  termination pause from her atrial arrhythmias. I have discussed the treatment options with the patient and her husband. One option would be to stop Flecainide and start Tikosyn after a 2 day wash out and dose Tikosyn for her renal function. A second option would be rate control only and hope that she did not go too slow. Another option would be to place a PPM and then use an AA drug with affect on the sinus node and AV conduction system like amio, sotalol or additional flecainide. She has clear cut sinus node dysfunction made worse by her flecainide. She has had break through of her atrial arrhythmias. She was not thought to be a good candidate for atrial fib ablation. The risks/benefits/goals/expectations of PPM insertion have been discussed with the patient and she wishes to proceed with PPM insertion.   Mikle Bosworth.D.

## 2014-04-26 NOTE — Progress Notes (Signed)
False reading

## 2014-04-26 NOTE — Progress Notes (Signed)
ANTICOAGULATION CONSULT NOTE - Initial Consult  Pharmacy Consult for Warfarin Indication: atrial fibrillation  Allergies  Allergen Reactions  . Prednisone     Increase blood sugar.    Patient Measurements: Height: 5\' 3"  (160 cm) Weight: 163 lb (73.936 kg) IBW/kg (Calculated) : 52.4  Vital Signs: Temp: 98.2 F (36.8 C) (12/16 0816) Temp Source: Oral (12/16 0816) BP: 123/60 mmHg (12/16 0816) Pulse Rate: 77 (12/16 0816)  Labs:  Recent Labs  04/24/14 2225 04/25/14 1349 04/25/14 1400 04/25/14 1530 04/25/14 2043 04/26/14 0235  HGB 15.6* 16.3* 17.0*  --   --  14.3  HCT 45.8 47.7* 50.0*  --   --  41.5  PLT 220 197  --   --   --  203  LABPROT 22.9* 24.1*  --   --   --  24.4*  INR 2.00* 2.14*  --   --   --  2.17*  CREATININE 1.70*  --  1.70*  --   --  1.78*  TROPONINI  --   --   --  <0.30 <0.30 <0.30    Estimated Creatinine Clearance: 25.5 mL/min (by C-G formula based on Cr of 1.78).   Assessment: 89 YOF who presented to the [facility] with recurrent Afib with RVR that was controlled with a dose of diltiazem and the patient was sent home with cardiology follow-up. During the office visit today, the patient became unresponsive, a code blue was called, patient regained consciousness, and the patient was sent in for admission.  Patient continues on warfarin for anticoagulation in the setting of Afib. PTA dose was 2.5 mg daily EXCEPT for 5 mg on Mon/Wed. Admit INR therapeutic at 2.14- she received her typical dose of 2.5mg  last evening and INR remains therapeutic this morning at 2.17. CBC is wnl and no bleeding noted.  Goal of Therapy:  INR 2-3 Monitor platelets by anticoagulation protocol: Yes   Plan:  1. Warfarin 5 mg x 1 dose as per home dose 2. Daily PT/INR for now- if she remains stable, can resume home dosing and reduce number of INR checks while hospitalized 3. Follow for any signs/symptoms of bleeding  Charleton Deyoung D. Elodia Haviland, PharmD, BCPS Clinical Pharmacist Pager:  [phone] 04/26/2014 11:02 AM

## 2014-04-26 NOTE — H&P (View-Only) (Signed)
Reason for Consult:   Syncope, recurrent atrial fibrillation, h/o sinus node dysfunction and tachybrady syndrome  Requesting Physician: Dr Radford Pax  HPI: This is a 80 y.o. female with a past medical history significant for PAF since 2010 on Coumadin, and Flecainide since 2011. Cath in 2012 showed normal coronaries and normal LVF. She has baseline bradycardia when in NSR and her Flecainide had been cut back about two years ago after she presented with symptomatic sinus bradycardia and heart rates in the 30/min range on flecainide 100 mg twice daily. Her dose was reduced and she has had recurrent atrial fib and flutter. She has done well until this past Monday when she had recurrent atrial fibrillation with RVR and went to [facility] ED. She was given IV Diltiazem for rate control and discharged with instructions to f/u in the office. On 04/25/14 she was in the office and apparently had a syncopal spell. Her EKG in the office showed  Atrial flutter per Dr Radford Pax- rate 122. She was admitted and has been stable since admission in atrial fibrillation with CVR on IV Diltiazem. We are asked to provide addditional rec's.  PMHx:  Past Medical History  Diagnosis Date  . Essential hypertension   . Gout   . GERD (gastroesophageal reflux disease)   . Insomnia   . Atrial fibrillation   . Type 2 diabetes mellitus   . Allergic rhinitis   . Hyperlipemia, mixed   . Chronic renal disease, stage IV     Past Surgical History  Procedure Laterality Date  . Cesarean section      x2  . Esophageal dilation      SOCHx:  reports that she has never smoked. She does not have any smokeless tobacco history on file. She reports that she does not drink alcohol or use illicit drugs.  FAMHx: Family History  Problem Relation Age of Onset  . Coronary artery disease Father 54    ALLERGIES: Allergies  Allergen Reactions  . Prednisone     Increase blood sugar.    ROS: Pertinent items are noted in  HPI. See H&P for complete ROS. She had a recent gout attack and was placed on a steroid dose pack. She denies any OTC medications.   HOME MEDICATIONS: Prior to Admission medications   Medication Sig Start Date End Date Taking? Authorizing Provider  atorvastatin (LIPITOR) 80 MG tablet Take 80 mg by mouth daily.   Yes Historical Provider, MD  benazepril (LOTENSIN) 20 MG tablet Take 20 mg by mouth daily.   Yes Historical Provider, MD  flecainide (TAMBOCOR) 50 MG tablet TAKE 1 TABLET BY MOUTH EVERY 12 HOURS 10/06/13  Yes Jettie Booze, MD  furosemide (LASIX) 40 MG tablet Take 40 mg by mouth daily.   Yes Historical Provider, MD  levothyroxine (SYNTHROID, LEVOTHROID) 50 MCG tablet Take 50 mcg by mouth daily. On a empty stomach.   Yes Historical Provider, MD  linagliptin (TRADJENTA) 5 MG TABS tablet Take 5 mg by mouth daily.   Yes Historical Provider, MD  Potassium Chloride Crys CR (KLOR-CON M20 PO) Take 20 mEq by mouth daily.   Yes Historical Provider, MD  predniSONE (DELTASONE) 20 MG tablet Take 20 mg by mouth daily with breakfast. 9 Day Taper. Take 3 tablets Daily For 3 Days, Take 2 tablets Daily for 3 Days, Take 1 tablet Daily for 3 Days.   Yes Historical Provider, MD  repaglinide (PRANDIN) 0.5 MG tablet Take 0.5 mg by mouth  2 (two) times daily before a meal.   Yes Historical Provider, MD  warfarin (COUMADIN) 5 MG tablet Take as directed by coumadin clinic Patient taking differently: Take 2.5-5 mg by mouth daily. Take 1 tablet (5 mg) on (Mon, Wed) & Take 1/2 tablet (2.5 mg) on (Tues, Thurs, Fri, Sat & Sun). 12/22/13  Yes Jettie Booze, MD  hydrALAZINE (APRESOLINE) 25 MG tablet Take 25 mg by mouth 3 (three) times daily. 04/24/14   Historical Provider, MD  KLOR-CON M20 20 MEQ tablet Take 20 mEq by mouth daily. 04/09/14   Historical Provider, MD    HOSPITAL MEDICATIONS: I have reviewed the patient's current medications.  VITALS: Blood pressure 123/60, pulse 77, temperature 98.2 F (36.8  C), temperature source Oral, resp. rate 16, height 5\' 3"  (1.6 m), weight 163 lb (73.936 kg), SpO2 97 %.  PHYSICAL EXAM: General appearance: alert, cooperative and no distress Neck: no carotid bruit and no JVD Lungs: clear to auscultation bilaterally Heart: irregularly irregular rhythm Abdomen: soft, non-tender; bowel sounds normal; no masses,  no organomegaly and mid line surgical scar from past C-section Extremities: extremities normal, atraumatic, no cyanosis or edema Pulses: 2+ and symmetric Skin: Skin color, texture, turgor normal. No rashes or lesions Neurologic: Grossly normal  LABS: Results for orders placed or performed during the hospital encounter of 04/25/14 (from the past 24 hour(s))  CBC with Differential     Status: Abnormal   Collection Time: 04/25/14  1:49 PM  Result Value Ref Range   WBC 10.3 4.0 - 10.5 K/uL   RBC 5.14 (H) 3.87 - 5.11 MIL/uL   Hemoglobin 16.3 (H) 12.0 - 15.0 g/dL   HCT 47.7 (H) 36.0 - 46.0 %   MCV 92.8 78.0 - 100.0 fL   MCH 31.7 26.0 - 34.0 pg   MCHC 34.2 30.0 - 36.0 g/dL   RDW 13.0 11.5 - 15.5 %   Platelets 197 150 - 400 K/uL   Neutrophils Relative % 75 43 - 77 %   Neutro Abs 7.6 1.7 - 7.7 K/uL   Lymphocytes Relative 14 12 - 46 %   Lymphs Abs 1.5 0.7 - 4.0 K/uL   Monocytes Relative 11 3 - 12 %   Monocytes Absolute 1.1 (H) 0.1 - 1.0 K/uL   Eosinophils Relative 0 0 - 5 %   Eosinophils Absolute 0.0 0.0 - 0.7 K/uL   Basophils Relative 0 0 - 1 %   Basophils Absolute 0.0 0.0 - 0.1 K/uL  Protime-INR     Status: Abnormal   Collection Time: 04/25/14  1:49 PM  Result Value Ref Range   Prothrombin Time 24.1 (H) 11.6 - 15.2 seconds   INR 2.14 (H) 0.00 - 1.49  I-stat troponin, ED     Status: None   Collection Time: 04/25/14  1:58 PM  Result Value Ref Range   Troponin i, poc 0.03 0.00 - 0.08 ng/mL   Comment 3          I-stat Chem 8, ED     Status: Abnormal   Collection Time: 04/25/14  2:00 PM  Result Value Ref Range   Sodium 138 137 - 147 mEq/L    Potassium 4.6 3.7 - 5.3 mEq/L   Chloride 105 96 - 112 mEq/L   BUN 43 (H) 6 - 23 mg/dL   Creatinine, Ser 1.70 (H) 0.50 - 1.10 mg/dL   Glucose, Bld 268 (H) 70 - 99 mg/dL   Calcium, Ion 1.02 (L) 1.13 - 1.30 mmol/L   TCO2 23  0 - 100 mmol/L   Hemoglobin 17.0 (H) 12.0 - 15.0 g/dL   HCT 50.0 (H) 36.0 - 46.0 %  D-dimer, quantitative     Status: None   Collection Time: 04/25/14  3:30 PM  Result Value Ref Range   D-Dimer, Quant <0.27 0.00 - 0.48 ug/mL-FEU  Hemoglobin A1c     Status: Abnormal   Collection Time: 04/25/14  3:30 PM  Result Value Ref Range   Hgb A1c MFr Bld 7.6 (H) <5.7 %   Mean Plasma Glucose 171 (H) <117 mg/dL  Pro b natriuretic peptide (BNP)     Status: Abnormal   Collection Time: 04/25/14  3:30 PM  Result Value Ref Range   Pro B Natriuretic peptide (BNP) 4716.0 (H) 0 - 450 pg/mL  Troponin I-(serum)     Status: None   Collection Time: 04/25/14  3:30 PM  Result Value Ref Range   Troponin I <0.30 <0.30 ng/mL  TSH     Status: None   Collection Time: 04/25/14  3:30 PM  Result Value Ref Range   TSH 1.230 0.350 - 4.500 uIU/mL  Glucose, capillary     Status: Abnormal   Collection Time: 04/25/14  4:56 PM  Result Value Ref Range   Glucose-Capillary 235 (H) 70 - 99 mg/dL  MRSA PCR Screening     Status: None   Collection Time: 04/25/14  6:15 PM  Result Value Ref Range   MRSA by PCR NEGATIVE NEGATIVE  Glucose, capillary     Status: Abnormal   Collection Time: 04/25/14  7:33 PM  Result Value Ref Range   Glucose-Capillary 305 (H) 70 - 99 mg/dL  Troponin I-(serum)     Status: None   Collection Time: 04/25/14  8:43 PM  Result Value Ref Range   Troponin I <0.30 <0.30 ng/mL  Troponin I-(serum)     Status: None   Collection Time: 04/26/14  2:35 AM  Result Value Ref Range   Troponin I <0.30 <0.30 ng/mL  Basic metabolic panel     Status: Abnormal   Collection Time: 04/26/14  2:35 AM  Result Value Ref Range   Sodium 140 137 - 147 mEq/L   Potassium 4.0 3.7 - 5.3 mEq/L    Chloride 102 96 - 112 mEq/L   CO2 23 19 - 32 mEq/L   Glucose, Bld 221 (H) 70 - 99 mg/dL   BUN 39 (H) 6 - 23 mg/dL   Creatinine, Ser 1.78 (H) 0.50 - 1.10 mg/dL   Calcium 8.8 8.4 - 10.5 mg/dL   GFR calc non Af Amer 26 (L) >90 mL/min   GFR calc Af Amer 31 (L) >90 mL/min   Anion gap 15 5 - 15  CBC     Status: None   Collection Time: 04/26/14  2:35 AM  Result Value Ref Range   WBC 8.1 4.0 - 10.5 K/uL   RBC 4.47 3.87 - 5.11 MIL/uL   Hemoglobin 14.3 12.0 - 15.0 g/dL   HCT 41.5 36.0 - 46.0 %   MCV 92.8 78.0 - 100.0 fL   MCH 32.0 26.0 - 34.0 pg   MCHC 34.5 30.0 - 36.0 g/dL   RDW 13.1 11.5 - 15.5 %   Platelets 203 150 - 400 K/uL  Protime-INR     Status: Abnormal   Collection Time: 04/26/14  2:35 AM  Result Value Ref Range   Prothrombin Time 24.4 (H) 11.6 - 15.2 seconds   INR 2.17 (H) 0.00 - 1.49  Glucose, capillary  Status: Abnormal   Collection Time: 04/26/14  7:43 AM  Result Value Ref Range   Glucose-Capillary 213 (H) 70 - 99 mg/dL    EKG: Atrial fibrillation with rapid VR  IMAGING: Dg Chest Port 1 View  04/25/2014   CLINICAL DATA:  Initial encounter. Atrial fibrillation. Near syncope. Weakness.  EXAM: PORTABLE CHEST - 1 VIEW  COMPARISON:  09/09/2010  FINDINGS: The heart size and mediastinal contours are within normal limits. Both lungs are clear. The visualized skeletal structures are unremarkable.  IMPRESSION: No active disease.   Electronically Signed   By: Kathreen Devoid   On: 04/25/2014 14:11    IMPRESSION: Principal Problem:   Syncope Active Problems:   Atrial flutter with rapid ventricular response   Paroxysmal atrial fibrillation   Bradycardia   Chronic kidney disease, stage III (moderate)   Essential hypertension, benign   Long term current use of anticoagulant therapy   Mixed hyperlipidemia   Acute gout- on steroid dose pack sinus node dysfunction  RECOMMENDATION: I suspect her syncope was either 1:1 atrial flutter or transient asystole secondary to a post  termination pause from her atrial arrhythmias. I have discussed the treatment options with the patient and her husband. One option would be to stop Flecainide and start Tikosyn after a 2 day wash out and dose Tikosyn for her renal function. A second option would be rate control only and hope that she did not go too slow. Another option would be to place a PPM and then use an AA drug with affect on the sinus node and AV conduction system like amio, sotalol or additional flecainide. She has clear cut sinus node dysfunction made worse by her flecainide. She has had break through of her atrial arrhythmias. She was not thought to be a good candidate for atrial fib ablation. The risks/benefits/goals/expectations of PPM insertion have been discussed with the patient and she wishes to proceed with PPM insertion.   Mikle Bosworth.D.

## 2014-04-26 NOTE — Progress Notes (Signed)
Inpatient Diabetes Program Recommendations  AACE/ADA: New Consensus Statement on Inpatient Glycemic Control  Target Ranges:  Prepandial:   less than 140 mg/dL      Peak postprandial:   less than 180 mg/dL (1-2 hours)      Critically ill patients:  140 - 180 mg/dL  Pager:  [phone] Hours:  8 am-10pm   Reason for Visit: Elevated glucose:  Results for Kathryn Richardson, Kathryn Richardson (MRN [id number]) as of 04/26/2014 11:44  Ref. Range 04/25/2014 16:56 04/25/2014 19:33 04/26/2014 07:43  Glucose-Capillary Latest Range: 70-99 mg/dL 235 (H) 305 (H) 213 (H)    Inpatient Diabetes Program Recommendations Insulin - Basal: Consider adding low dose basal insulin while on prednisone taper:  Lantus 15 units daily  Courtney Heys PhD, RN, BC-ADM Diabetes Coordinator  Office:  [phone] Team Pager:  [phone]

## 2014-04-26 NOTE — Interval H&P Note (Signed)
History and Physical Interval Note:  04/26/2014 2:49 PM  Kathryn Richardson  has presented today for surgery, with the diagnosis of bradicardia  The various methods of treatment have been discussed with the patient and family. After consideration of risks, benefits and other options for treatment, the patient has consented to  Procedure(s): PERMANENT PACEMAKER INSERTION (N/A) as a surgical intervention .  The patient's history has been reviewed, patient examined, no change in status, stable for surgery.  I have reviewed the patient's chart and labs.  Questions were answered to the patient's satisfaction.     Mikle Bosworth.D.

## 2014-04-26 NOTE — Progress Notes (Signed)
UR completed 

## 2014-04-26 NOTE — CV Procedure (Signed)
SURGEON:  Cristopher Peru, MD     PREPROCEDURE DIAGNOSIS:  Symptomatic Bradycardia due to sinus node dysfunction    POSTPROCEDURE DIAGNOSIS:  Same as preprocedure diagnosis     PROCEDURES:   1.  Pacemaker implantation.     INTRODUCTION: Kathryn Richardson is a 79 y.o. female  with a history of bradycardia who presents today for pacemaker implantation.  The patient reports intermittent episodes of dizziness over the past few months.  No reversible causes have been identified.  The patient therefore presents today for pacemaker implantation.     DESCRIPTION OF PROCEDURE:  Informed written consent was obtained, and   the patient was brought to the electrophysiology lab in a fasting state.  The patient required no sedation for the procedure today.  The patients left chest was prepped and draped in the usual sterile fashion by the EP lab staff. The skin overlying the left deltopectoral region was infiltrated with lidocaine for local analgesia.  A 4-cm incision was made over the left deltopectoral region.  A left subcutaneous pacemaker pocket was fashioned using a combination of sharp and blunt dissection. Electrocautery was required to assure hemostasis.     RA/RV Lead Placement: The left axillary vein was therefore directly visualized and cannulated.  Through the left axillary vein, a Medtronic [id number] (serial number [id number]) right atrial lead and a Medtronic 5076 (serial number [id number] right ventricular lead were advanced with fluoroscopic visualization into the right atrial appendage and right ventricular apical septal positions respectively.  Initial atrial lead P- waves (fib) measured 1 mV with impedance of 524 ohms. Right ventricular lead R-waves measured 14.2 mV with an impedance of 832 ohms and a threshold of 0.7 V at 0.5 msec.  Both leads were secured to the pectoralis fascia using #2-0 silk over the suture sleeves.   Device Placement:  The leads were then connected to a Medtronic (serial number  [id number]) pacemaker.  The pocket was irrigated with copious gentamicin solution.  The pacemaker was then placed into the pocket.  The pocket was then closed in 2 layers with 2.0 Vicryl suture for the subcutaneous and subcuticular layers.  Steri-Strips and a sterile dressing were then applied.  There were no early apparent complications.     CONCLUSIONS:   1. Successful implantation of a Medtronic dual-chamber pacemaker for symptomatic bradycardia due to sinus node dysfunction.  2. No early apparent complications.           Cristopher Peru, MD 04/26/2014 3:56 PM

## 2014-04-27 ENCOUNTER — Inpatient Hospital Stay (HOSPITAL_COMMUNITY): Payer: Medicare Other

## 2014-04-27 LAB — PROTIME-INR
INR: 2.27 — ABNORMAL HIGH (ref 0.00–1.49)
Prothrombin Time: 25.2 seconds — ABNORMAL HIGH (ref 11.6–15.2)

## 2014-04-27 LAB — GLUCOSE, CAPILLARY
Glucose-Capillary: 230 mg/dL — ABNORMAL HIGH (ref 70–99)
Glucose-Capillary: 161 mg/dL — ABNORMAL HIGH (ref 70–99)

## 2014-04-27 MED ORDER — WARFARIN SODIUM 2.5 MG PO TABS: 2.5000 mg | ORAL_TABLET | Freq: Once | ORAL | Status: DC

## 2014-04-27 MED ORDER — DILTIAZEM HCL ER COATED BEADS 240 MG PO CP24: 240.0000 mg | ORAL_CAPSULE | Freq: Every day | ORAL | Status: DC

## 2014-04-27 MED ORDER — DILTIAZEM HCL ER COATED BEADS 240 MG PO CP24
240.0000 mg | ORAL_CAPSULE | Freq: Every day | ORAL | Status: DC
  Administered 2014-04-27: 240 mg via ORAL
  Filled 2014-04-27: qty 1

## 2014-04-27 NOTE — Discharge Summary (Signed)
ELECTROPHYSIOLOGY PROCEDURE DISCHARGE SUMMARY    Patient ID: Kathryn Richardson,  MRN: [id number], DOB/AGE: [birth year] 79 y.o.  Admit date: 04/25/2014 Discharge date: 04/27/2014  Primary Care Physician: Gara Kroner, MD Primary Cardiologist: Irish Lack Electrophysiologist: Lovena Le  Primary Discharge Diagnosis:  Symptomatic bradycardia status post pacemaker implantation this admission  Secondary Discharge Diagnosis:  1.  Atrial fibrillation with RVR 2.  Hypertension 3.  Diabetes 4.  Gout 5.  Hyperlipidemia 6.  Chronic kidney disease  Allergies  Allergen Reactions  . Prednisone     Increase blood sugar.    Procedures This Admission:  1.  Implantation of a MDT dual chamber PPM on 04-26-14 by Dr Lovena Le.  The patient received a MDT model number Advisa PPM with model number 5076 right atrial lead and 5076 right ventricular lead. There were no immediate post procedure complications. 2.  CXR on 04-27-14 demonstrated no pneumothorax status post device implantation.   Brief HPI/Hospital Course:  Kathryn Richardson is a 79 y.o. female with a past medical history as outlined above.  She has been maintained on Flecainide for control of atrial fibrillation. Up-titration and addition of AVN blocking agents has been limited due to bradycardia.  She has had recurrent atrial fibrillation with RVR and was evaluated in the office on the day of admission.  Admission was recommended for consideration of pacemaker placement.  Due to tachy-brady syndrome, pacemaker implantation was recommended. Risks, benefits, and alternatives to PPM implantation were reviewed with the patient who wished to proceed.  The patient underwent implantation of a MDT dual chamber pacemaker with details as outlined above.  She  was monitored on telemetry overnight which demonstrated atrial fibrillation with ventricular rates in the 100's.  Left chest was without hematoma or ecchymosis.  The device was interrogated and found to be  functioning normally.  CXR was obtained and demonstrated no pneumothorax status post device implantation.  Wound care, arm mobility, and restrictions were reviewed with the patient.  Dr Rayann Heman examined the patient and considered them stable for discharge to home. No driving x 6 months was discussed with the patient by Dr Rayann Heman.  She will follow closely in the AF clinic and with Dr Lovena Le.  At discharge, diltiazem CD 240mg  daily will be added for rate control.  She will have early follow-up to determine if this needs to be up-titrated.  She will be continued on Flecainide.   Discharge Vitals: Blood pressure 132/80, pulse 111, temperature 98.4 F (36.9 C), temperature source Oral, resp. rate 16, height 5\' 3"  (1.6 m), weight 172 lb (78.019 kg), SpO2 94 %.   Physical Exam: Filed Vitals:   04/27/14 0032 04/27/14 0439 04/27/14 0440 04/27/14 1421  BP: 108/65 132/80  106/61  Pulse:   111 91  Temp:   98.4 F (36.9 C) 98.1 F (36.7 C)  TempSrc:   Oral Oral  Resp:   16 16  Height:      Weight:   172 lb (78.019 kg)   SpO2:   94% 98%    GEN- The patient is well appearing, alert and oriented x 3 today.   Head- normocephalic, atraumatic Eyes-  Sclera clear, conjunctiva pink Ears- hearing intact Oropharynx- clear Neck- supple, Lungs- Clear to ausculation bilaterally, normal work of breathing Heart- irregular rate and rhythm, no murmurs, rubs or gallops, PMI not laterally displaced GI- soft, NT, ND, + BS Extremities- no clubbing, cyanosis, or edema, groin is without hematoma/ bruit MS- no significant deformity or atrophy Skin- pacemaker pocket  is without hematoma    Labs:   Lab Results  Component Value Date   WBC 8.1 04/26/2014   HGB 14.3 04/26/2014   HCT 41.5 04/26/2014   MCV 92.8 04/26/2014   PLT 203 04/26/2014    Recent Labs Lab 04/26/14 0235  NA 140  K 4.0  CL 102  CO2 23  BUN 39*  CREATININE 1.78*  CALCIUM 8.8  GLUCOSE 221*    Discharge Medications:    Medication  List    ASK your doctor about these medications        atorvastatin 80 MG tablet  Commonly known as:  LIPITOR  Take 80 mg by mouth daily.     benazepril 20 MG tablet  Commonly known as:  LOTENSIN  Take 20 mg by mouth daily.     flecainide 50 MG tablet  Commonly known as:  TAMBOCOR  TAKE 1 TABLET BY MOUTH EVERY 12 HOURS     furosemide 40 MG tablet  Commonly known as:  LASIX  Take 40 mg by mouth daily.     hydrALAZINE 25 MG tablet  Commonly known as:  APRESOLINE  Take 25 mg by mouth 3 (three) times daily.     KLOR-CON M20 PO  Take 20 mEq by mouth daily.     KLOR-CON M20 20 MEQ tablet  Generic drug:  potassium chloride SA  Take 20 mEq by mouth daily.     levothyroxine 50 MCG tablet  Commonly known as:  SYNTHROID, LEVOTHROID  Take 50 mcg by mouth daily. On a empty stomach.     predniSONE 20 MG tablet  Commonly known as:  DELTASONE  Take 20 mg by mouth daily with breakfast. 9 Day Taper. Take 3 tablets Daily For 3 Days, Take 2 tablets Daily for 3 Days, Take 1 tablet Daily for 3 Days.     repaglinide 0.5 MG tablet  Commonly known as:  PRANDIN  Take 0.5 mg by mouth 2 (two) times daily before a meal.     TRADJENTA 5 MG Tabs tablet  Generic drug:  linagliptin  Take 5 mg by mouth daily.     warfarin 5 MG tablet  Commonly known as:  COUMADIN  Take as directed by coumadin clinic        Duration of Discharge Encounter: Greater than 30 minutes including physician time.  Signed,  Thompson Grayer MD

## 2014-04-27 NOTE — Progress Notes (Signed)
[facility] for Warfarin Indication: atrial fibrillation  Allergies  Allergen Reactions  . Prednisone     Increase blood sugar.    Patient Measurements: Height: 5\' 3"  (160 cm) Weight: 172 lb (78.019 kg) IBW/kg (Calculated) : 52.4  Vital Signs: Temp: 98.4 F (36.9 C) (12/17 0440) Temp Source: Oral (12/17 0440) BP: 132/80 mmHg (12/17 0439) Pulse Rate: 111 (12/17 0440)  Labs:  Recent Labs  04/24/14 2225 04/25/14 1349 04/25/14 1400 04/25/14 1530 04/25/14 2043 04/26/14 0235 04/27/14 0434  HGB 15.6* 16.3* 17.0*  --   --  14.3  --   HCT 45.8 47.7* 50.0*  --   --  41.5  --   PLT 220 197  --   --   --  203  --   LABPROT 22.9* 24.1*  --   --   --  24.4* 25.2*  INR 2.00* 2.14*  --   --   --  2.17* 2.27*  CREATININE 1.70*  --  1.70*  --   --  1.78*  --   TROPONINI  --   --   --  <0.30 <0.30 <0.30  --     Estimated Creatinine Clearance: 26.2 mL/min (by C-G formula based on Cr of 1.78).   Assessment: 44 YOF who presented to the [facility] with recurrent Afib with RVR that was controlled with a dose of diltiazem and the patient was sent home with cardiology follow-up. During the office visit today, the patient became unresponsive, a code blue was called, patient regained consciousness, and the patient was sent in for admission.  Patient continues on warfarin for anticoagulation in the setting of Afib. PTA dose was 2.5 mg daily EXCEPT for 5 mg on Mon/Wed.   INR continues to be therapeutic on home dose   Goal of Therapy:  INR 2-3 Monitor platelets by anticoagulation protocol: Yes   Plan:  1. Warfarin 2.5 mg x 1 dose as per home dose 2. Daily PT/INR for now- 3. Follow for any signs/symptoms of bleeding  Thank you. Anette Guarneri, PharmD [phone]  04/27/2014 10:29 AM

## 2014-04-27 NOTE — Discharge Instructions (Signed)
° °  Supplemental Discharge Instructions for  Pacemaker/Defibrillator Patients  Activity No heavy lifting or vigorous activity with your left/right arm for 6 to 8 weeks.  Do not raise your left/right arm above your head for one week.  Gradually raise your affected arm as drawn below.                       12/20                   12/21                    12/22                    12/23            NO DRIVING for  6 months    ; you may begin driving on     [id number]     . WOUND CARE - Keep the wound area clean and dry.  Do not get this area wet for one week. No showers for one week; you may shower on      12/24        . - The tape/steri-strips on your wound will fall off; do not pull them off.  No bandage is needed on the site.  DO  NOT apply any creams, oils, or ointments to the wound area. - If you notice any drainage or discharge from the wound, any swelling or bruising at the site, or you develop a fever > 101? F after you are discharged home, call the office at once.  Special Instructions - You are still able to use cellular telephones; use the ear opposite the side where you have your pacemaker/defibrillator.  Avoid carrying your cellular phone near your device. - When traveling through airports, show security personnel your identification card to avoid being screened in the metal detectors.  Ask the security personnel to use the hand wand. - Avoid arc welding equipment, MRI testing (magnetic resonance imaging), TENS units (transcutaneous nerve stimulators).  Call the office for questions about other devices. - Avoid electrical appliances that are in poor condition or are not properly grounded. - Microwave ovens are safe to be near or to operate.  Additional information for defibrillator patients should your device go off: - If your device goes off ONCE and you feel fine afterward, notify the device clinic nurses. - If your device goes off ONCE and you do not feel well afterward, call  911. - If your device goes off TWICE, call 911. - If your device goes off THREE times in one day, call 911.  DO NOT DRIVE YOURSELF OR A FAMILY MEMBER WITH A DEFIBRILLATOR TO THE HOSPITAL--CALL 911.

## 2014-04-27 NOTE — Progress Notes (Signed)
Patient HR sustaining around 113, Dr. Elias Else made aware, no new orders given at this time. Will continue to monitor patient.

## 2014-05-02 ENCOUNTER — Encounter (HOSPITAL_COMMUNITY): Payer: Self-pay | Admitting: Nurse Practitioner

## 2014-05-02 ENCOUNTER — Ambulatory Visit (HOSPITAL_COMMUNITY)
Admission: RE | Admit: 2014-05-02 | Discharge: 2014-05-02 | Disposition: A | Discharge status: Home / Self Care | Payer: Medicare Other | Source: Ambulatory Visit | Attending: Nurse Practitioner | Admitting: Nurse Practitioner

## 2014-05-02 VITALS — BP 140/82 | HR 99 | Resp 18 | Wt 165.0 lb

## 2014-05-02 DIAGNOSIS — I129 Hypertensive chronic kidney disease with stage 1 through stage 4 chronic kidney disease, or unspecified chronic kidney disease: Secondary | ICD-10-CM | POA: Insufficient documentation

## 2014-05-02 DIAGNOSIS — M109 Gout, unspecified: Secondary | ICD-10-CM | POA: Diagnosis not present

## 2014-05-02 DIAGNOSIS — I4891 Unspecified atrial fibrillation: Secondary | ICD-10-CM | POA: Diagnosis not present

## 2014-05-02 DIAGNOSIS — G4700 Insomnia, unspecified: Secondary | ICD-10-CM | POA: Diagnosis not present

## 2014-05-02 DIAGNOSIS — R609 Edema, unspecified: Secondary | ICD-10-CM | POA: Diagnosis not present

## 2014-05-02 DIAGNOSIS — Z950 Presence of cardiac pacemaker: Secondary | ICD-10-CM | POA: Insufficient documentation

## 2014-05-02 DIAGNOSIS — R001 Bradycardia, unspecified: Secondary | ICD-10-CM | POA: Insufficient documentation

## 2014-05-02 DIAGNOSIS — Z79899 Other long term (current) drug therapy: Secondary | ICD-10-CM | POA: Diagnosis not present

## 2014-05-02 DIAGNOSIS — I4819 Other persistent atrial fibrillation: Secondary | ICD-10-CM

## 2014-05-02 DIAGNOSIS — E785 Hyperlipidemia, unspecified: Secondary | ICD-10-CM | POA: Diagnosis not present

## 2014-05-02 DIAGNOSIS — N184 Chronic kidney disease, stage 4 (severe): Secondary | ICD-10-CM | POA: Insufficient documentation

## 2014-05-02 DIAGNOSIS — Z7952 Long term (current) use of systemic steroids: Secondary | ICD-10-CM | POA: Insufficient documentation

## 2014-05-02 DIAGNOSIS — K219 Gastro-esophageal reflux disease without esophagitis: Secondary | ICD-10-CM | POA: Diagnosis not present

## 2014-05-02 DIAGNOSIS — E782 Mixed hyperlipidemia: Secondary | ICD-10-CM | POA: Diagnosis not present

## 2014-05-02 DIAGNOSIS — E559 Vitamin D deficiency, unspecified: Secondary | ICD-10-CM | POA: Diagnosis not present

## 2014-05-02 DIAGNOSIS — E119 Type 2 diabetes mellitus without complications: Secondary | ICD-10-CM | POA: Diagnosis not present

## 2014-05-02 DIAGNOSIS — E039 Hypothyroidism, unspecified: Secondary | ICD-10-CM | POA: Diagnosis not present

## 2014-05-02 DIAGNOSIS — Z7901 Long term (current) use of anticoagulants: Secondary | ICD-10-CM | POA: Diagnosis not present

## 2014-05-02 DIAGNOSIS — I481 Persistent atrial fibrillation: Secondary | ICD-10-CM | POA: Diagnosis not present

## 2014-05-02 NOTE — Progress Notes (Signed)
Primary Care Physician: Gara Kroner, MD Referring Physician:  Dr Saunders Kathryn Richardson is a 79 y.o. female with a h/o paroxysmal atrial fibrillation and bradycardia who presents today for EP follow-up following hospitalization for afib with rvr and a syncopal episode, receiving a PPM 12/16.  She was discharged in afib on flecainide 50 mg bid, and diltiazem 240 mg qd was added for rate control. She is currently feeling well, no further syncopal episodes. Pacer site with steri strips in place. Restrictions of left arm reviewed as well as no driving x 6 months due to syncopal episode.Recent INR in range at 2.27. She is currently rate controlled with v. Rate of 77 bpm. .   Today, she denies symptoms of palpitations, chest pain, shortness of breath, orthopnea, PND, lower extremity edema, presyncope, syncope, or neurologic sequela. The patient is tolerating medications without difficulties and is otherwise without complaint today.   Past Medical History  Diagnosis Date  . Essential hypertension   . Gout   . GERD (gastroesophageal reflux disease)   . Insomnia   . Atrial fibrillation   . Type 2 diabetes mellitus   . Allergic rhinitis   . Hyperlipemia, mixed   . Chronic renal disease, stage IV    Past Surgical History  Procedure Laterality Date  . Cesarean section      x2  . Esophageal dilation    . Permanent pacemaker insertion N/A 04/26/2014    Procedure: PERMANENT PACEMAKER INSERTION;  Surgeon: Evans Lance, MD;  Location: [facility] CATH LAB;  Service: Cardiovascular;  Laterality: N/A;    Current Outpatient Prescriptions  Medication Sig Dispense Refill  . atorvastatin (LIPITOR) 80 MG tablet Take 80 mg by mouth daily.    . benazepril (LOTENSIN) 20 MG tablet Take 20 mg by mouth daily.    Marland Kitchen diltiazem (CARDIZEM CD) 240 MG 24 hr capsule Take 1 capsule (240 mg total) by mouth daily. 30 capsule 11  . flecainide (TAMBOCOR) 50 MG tablet TAKE 1 TABLET BY MOUTH EVERY 12 HOURS 60 tablet 11    . furosemide (LASIX) 40 MG tablet Take 40 mg by mouth daily.    Marland Kitchen KLOR-CON M20 20 MEQ tablet Take 20 mEq by mouth daily.  3  . levothyroxine (SYNTHROID, LEVOTHROID) 50 MCG tablet Take 50 mcg by mouth daily. On a empty stomach.    . linagliptin (TRADJENTA) 5 MG TABS tablet Take 5 mg by mouth daily.    . predniSONE (DELTASONE) 20 MG tablet Take 20 mg by mouth daily with breakfast. 9 Day Taper. Take 3 tablets Daily For 3 Days, Take 2 tablets Daily for 3 Days, Take 1 tablet Daily for 3 Days.    . repaglinide (PRANDIN) 0.5 MG tablet Take 0.5 mg by mouth 2 (two) times daily before a meal.    . warfarin (COUMADIN) 5 MG tablet Take as directed by coumadin clinic (Patient taking differently: Take 2.5-5 mg by mouth daily. Take 1 tablet (5 mg) on (Mon, Wed) & Take 1/2 tablet (2.5 mg) on (Tues, Thurs, Fri, Sat & Sun).) 30 tablet 3   No current facility-administered medications for this encounter.    Allergies  Allergen Reactions  . Prednisone     Increase blood sugar.    History   Social History  . Marital Status: Married    Spouse Name: N/A    Number of Children: N/A  . Years of Education: N/A   Occupational History  . Not on file.   Social History  Main Topics  . Smoking status: Never Smoker   . Smokeless tobacco: Not on file  . Alcohol Use: No  . Drug Use: No  . Sexual Activity: Not on file   Other Topics Concern  . Not on file   Social History Narrative   Lives in [place].  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 Coronary artery disease Father 39   Physical Exam: Filed Vitals:   05/02/14 1633  BP: 140/82  Pulse: 99  Resp: 18  Weight: 165 lb (74.844 kg)  SpO2: 99%    GEN- The patient is well appearing, alert and oriented x 3 today.   Head- normocephalic, atraumatic Eyes-  Sclera clear, conjunctiva pink Ears- hearing intact Oropharynx- clear Neck- supple, no JVP Lymph- no cervical  lymphadenopathy Lungs- Clear to ausculation bilaterally, normal work of breathing Heart- Irregular rate and rhythm, no murmurs, rubs or gallops, PMI not laterally displaced. steri strips in tact.No redness/drainage noted. GI- soft, NT, ND, + BS Extremities- no clubbing, cyanosis, or edema   EKG today reveals afib 77 bpm, OTc 439 ms. Epic records reviewed in detail.   Assessment and Plan: 1. Afib with symptomatic bradycardia/syncopy S/P PPM Currently rate controlled, continue flecainide and diltiazem without changes. Continue warfarin. F/u as scheduled with device clinic 12/28 and Dr. Lovena Le 3/22.

## 2014-05-03 ENCOUNTER — Other Ambulatory Visit (INDEPENDENT_AMBULATORY_CARE_PROVIDER_SITE_OTHER): Payer: Medicare Other | Admitting: *Deleted

## 2014-05-03 ENCOUNTER — Ambulatory Visit (INDEPENDENT_AMBULATORY_CARE_PROVIDER_SITE_OTHER): Payer: Medicare Other | Admitting: *Deleted

## 2014-05-03 ENCOUNTER — Other Ambulatory Visit: Payer: Self-pay

## 2014-05-03 DIAGNOSIS — I480 Paroxysmal atrial fibrillation: Secondary | ICD-10-CM

## 2014-05-03 DIAGNOSIS — I4891 Unspecified atrial fibrillation: Secondary | ICD-10-CM | POA: Diagnosis not present

## 2014-05-03 DIAGNOSIS — Z5181 Encounter for therapeutic drug level monitoring: Secondary | ICD-10-CM | POA: Diagnosis not present

## 2014-05-03 LAB — POCT INR: INR: 2.8

## 2014-05-04 NOTE — Addendum Note (Signed)
Encounter addended by: Georga Kaufmann, CCT on: 05/04/2014  8:17 AM<BR>     Documentation filed: Charges VN

## 2014-05-08 ENCOUNTER — Ambulatory Visit: Payer: Medicare Other | Admitting: Cardiology

## 2014-05-08 ENCOUNTER — Ambulatory Visit: Payer: Medicare Other

## 2014-05-08 ENCOUNTER — Ambulatory Visit (INDEPENDENT_AMBULATORY_CARE_PROVIDER_SITE_OTHER): Payer: Medicare Other | Admitting: *Deleted

## 2014-05-08 DIAGNOSIS — I480 Paroxysmal atrial fibrillation: Secondary | ICD-10-CM

## 2014-05-08 DIAGNOSIS — R001 Bradycardia, unspecified: Secondary | ICD-10-CM | POA: Diagnosis not present

## 2014-05-08 LAB — MDC_IDC_ENUM_SESS_TYPE_INCLINIC
Battery Voltage: 3.1 V
Brady Statistic AP VP Percent: 0.13 %
Brady Statistic AS VP Percent: 18.82 %
Brady Statistic RV Percent Paced: 18.96 %
Date Time Interrogation Session: 20151228150613
Lead Channel Impedance Value: 456 Ohm
Lead Channel Impedance Value: 513 Ohm
Lead Channel Pacing Threshold Amplitude: 1 V
Lead Channel Sensing Intrinsic Amplitude: 0.25 mV
Lead Channel Sensing Intrinsic Amplitude: 0.375 mV
Lead Channel Sensing Intrinsic Amplitude: 15.375 mV
Lead Channel Sensing Intrinsic Amplitude: 18.875 mV
MDC IDC MSMT LEADCHNL RA IMPEDANCE VALUE: 380 Ohm
MDC IDC MSMT LEADCHNL RV IMPEDANCE VALUE: 475 Ohm
MDC IDC MSMT LEADCHNL RV PACING THRESHOLD PULSEWIDTH: 0.4 ms
MDC IDC SET LEADCHNL RA PACING AMPLITUDE: 3.5 V
MDC IDC SET LEADCHNL RV PACING AMPLITUDE: 3.5 V
MDC IDC SET LEADCHNL RV PACING PULSEWIDTH: 0.4 ms
MDC IDC SET LEADCHNL RV SENSING SENSITIVITY: 0.9 mV
MDC IDC SET ZONE DETECTION INTERVAL: 350 ms
MDC IDC STAT BRADY AP VS PERCENT: 0.04 %
MDC IDC STAT BRADY AS VS PERCENT: 81 %
MDC IDC STAT BRADY RA PERCENT PACED: 0.17 %
Zone Setting Detection Interval: 400 ms

## 2014-05-08 NOTE — Progress Notes (Signed)
Wound check appointment. Steri-strips removed. Wound without redness or edema. Incision edges approximated, wound well healed. Normal device function. Threshold, sensing, and impedances consistent with implant measurements. Device programmed at 3.5V for extra safety margin until 3 month visit. Histogram distribution appropriate for patient and level of activity. Persistant AF (100%) + Warfarin/Flecainide. No high ventricular rates noted. Patient educated about wound care, arm mobility, lifting restrictions. ROV with GT on 3/22 @ 8:30am and with Ceasar Lund on 1-8.

## 2014-05-09 LAB — FLECAINIDE LEVEL: Flecainide: 0.64 ug/mL (ref 0.20–1.00)

## 2014-05-16 ENCOUNTER — Other Ambulatory Visit: Payer: Self-pay | Admitting: Family Medicine

## 2014-05-16 ENCOUNTER — Ambulatory Visit
Admission: RE | Admit: 2014-05-16 | Discharge: 2014-05-16 | Disposition: A | Discharge status: Home / Self Care | Payer: Medicare Other | Source: Ambulatory Visit | Attending: Family Medicine | Admitting: Family Medicine

## 2014-05-16 DIAGNOSIS — Z950 Presence of cardiac pacemaker: Secondary | ICD-10-CM | POA: Diagnosis not present

## 2014-05-16 DIAGNOSIS — J069 Acute upper respiratory infection, unspecified: Secondary | ICD-10-CM | POA: Diagnosis not present

## 2014-05-16 DIAGNOSIS — R05 Cough: Secondary | ICD-10-CM | POA: Diagnosis not present

## 2014-05-16 DIAGNOSIS — I4891 Unspecified atrial fibrillation: Secondary | ICD-10-CM | POA: Diagnosis not present

## 2014-05-16 DIAGNOSIS — M25561 Pain in right knee: Secondary | ICD-10-CM | POA: Diagnosis not present

## 2014-05-16 DIAGNOSIS — R0989 Other specified symptoms and signs involving the circulatory and respiratory systems: Secondary | ICD-10-CM | POA: Diagnosis not present

## 2014-05-19 ENCOUNTER — Ambulatory Visit (HOSPITAL_COMMUNITY)
Admission: RE | Admit: 2014-05-19 | Discharge: 2014-05-19 | Disposition: A | Discharge status: Home / Self Care | Payer: Medicare Other | Source: Ambulatory Visit | Attending: Nurse Practitioner | Admitting: Nurse Practitioner

## 2014-05-19 VITALS — BP 132/62 | HR 100 | Resp 14 | Ht 63.0 in | Wt 162.8 lb

## 2014-05-19 DIAGNOSIS — M109 Gout, unspecified: Secondary | ICD-10-CM | POA: Insufficient documentation

## 2014-05-19 DIAGNOSIS — Z79899 Other long term (current) drug therapy: Secondary | ICD-10-CM | POA: Diagnosis not present

## 2014-05-19 DIAGNOSIS — Z950 Presence of cardiac pacemaker: Secondary | ICD-10-CM | POA: Insufficient documentation

## 2014-05-19 DIAGNOSIS — R001 Bradycardia, unspecified: Secondary | ICD-10-CM | POA: Insufficient documentation

## 2014-05-19 DIAGNOSIS — E782 Mixed hyperlipidemia: Secondary | ICD-10-CM | POA: Insufficient documentation

## 2014-05-19 DIAGNOSIS — G4700 Insomnia, unspecified: Secondary | ICD-10-CM | POA: Diagnosis not present

## 2014-05-19 DIAGNOSIS — K219 Gastro-esophageal reflux disease without esophagitis: Secondary | ICD-10-CM | POA: Insufficient documentation

## 2014-05-19 DIAGNOSIS — I4819 Other persistent atrial fibrillation: Secondary | ICD-10-CM

## 2014-05-19 DIAGNOSIS — E119 Type 2 diabetes mellitus without complications: Secondary | ICD-10-CM | POA: Insufficient documentation

## 2014-05-19 DIAGNOSIS — N184 Chronic kidney disease, stage 4 (severe): Secondary | ICD-10-CM | POA: Diagnosis not present

## 2014-05-19 DIAGNOSIS — I481 Persistent atrial fibrillation: Secondary | ICD-10-CM

## 2014-05-19 DIAGNOSIS — Z7901 Long term (current) use of anticoagulants: Secondary | ICD-10-CM | POA: Diagnosis not present

## 2014-05-19 DIAGNOSIS — I129 Hypertensive chronic kidney disease with stage 1 through stage 4 chronic kidney disease, or unspecified chronic kidney disease: Secondary | ICD-10-CM | POA: Insufficient documentation

## 2014-05-19 DIAGNOSIS — R5383 Other fatigue: Secondary | ICD-10-CM | POA: Diagnosis not present

## 2014-05-19 DIAGNOSIS — I4891 Unspecified atrial fibrillation: Secondary | ICD-10-CM

## 2014-05-19 MED ORDER — FLECAINIDE ACETATE 100 MG PO TABS: ORAL_TABLET | ORAL | Status: DC

## 2014-05-19 NOTE — Progress Notes (Signed)
Primary Care Physician: Gara Kroner, MD Referring Physician:  Dr Saunders Kathryn Richardson is a 80 y.o. female with a h/o persistent atrial fibrillation and bradycardia who presents today for EP follow-up following hospitalization for afib with rvr and a syncopal episode, receiving a PPM 12/16.  She was discharged in afib on flecainide 50 mg  and diltiazem 240 mg qd was added for rate control.   She is currently feeling well, no further syncopal episodes, but does feel weaker in afib and would like to be back in SR. Per conversation with Dr. Rayann Heman, after last office viist, get flecaiindie level and if normal, increase to 100 mg bid, which pt has been on in the past, and if doesn't chemically convert, then pt will be scheduled for cardioversion. Flecainide level normal range at 0.64.   Pacer site with steri strips removed,well healed. Restrictions of left arm reviewed as well as no driving x 6 months due to syncopal episode.Recent INR in range, 2.8. She is currently rate controlled with v. Rate of 98 bpm. .   Today, she denies symptoms of palpitations, chest pain, shortness of breath, orthopnea, PND, lower extremity edema, presyncope, syncope, or neurologic sequela. More fatigue/weakness associated with afib.The patient is tolerating medications without difficulties and is otherwise without complaint today.   Past Medical History  Diagnosis Date  . Essential hypertension   . Gout   . GERD (gastroesophageal reflux disease)   . Insomnia   . Atrial fibrillation   . Type 2 diabetes mellitus   . Allergic rhinitis   . Hyperlipemia, mixed   . Chronic renal disease, stage IV    Past Surgical History  Procedure Laterality Date  . Cesarean section      x2  . Esophageal dilation    . Permanent pacemaker insertion N/A 04/26/2014    Procedure: PERMANENT PACEMAKER INSERTION;  Surgeon: Evans Lance, MD;  Location: [facility] CATH LAB;  Service: Cardiovascular;  Laterality: N/A;    Current  Outpatient Prescriptions  Medication Sig Dispense Refill  . atorvastatin (LIPITOR) 80 MG tablet Take 80 mg by mouth daily.    . benazepril (LOTENSIN) 20 MG tablet Take 20 mg by mouth daily.    Marland Kitchen diltiazem (CARDIZEM CD) 240 MG 24 hr capsule Take 1 capsule (240 mg total) by mouth daily. 30 capsule 11  . flecainide (TAMBOCOR) 50 MG tablet TAKE 1 TABLET BY MOUTH EVERY 12 HOURS 60 tablet 11  . furosemide (LASIX) 40 MG tablet Take 40 mg by mouth daily.    Marland Kitchen KLOR-CON M20 20 MEQ tablet Take 20 mEq by mouth daily.  3  . levothyroxine (SYNTHROID, LEVOTHROID) 50 MCG tablet Take 50 mcg by mouth daily. On a empty stomach.    . linagliptin (TRADJENTA) 5 MG TABS tablet Take 5 mg by mouth daily.    . repaglinide (PRANDIN) 0.5 MG tablet Take 0.5 mg by mouth 2 (two) times daily before a meal.    . warfarin (COUMADIN) 5 MG tablet Take as directed by coumadin clinic (Patient taking differently: Take 2.5-5 mg by mouth daily. Take 1 tablet (5 mg) on (Mon, Wed) & Take 1/2 tablet (2.5 mg) on (Tues, Thurs, Fri, Sat & Sun).) 30 tablet 3   No current facility-administered medications for this encounter.    Allergies  Allergen Reactions  . Prednisone     Increase blood sugar.    History   Social History  . Marital Status: Married    Spouse Name: N/A  Number of Children: N/A  . Years of Education: N/A   Occupational History  . Not on file.   Social History Main Topics  . Smoking status: Never Smoker   . Smokeless tobacco: Not on file  . Alcohol Use: No  . Drug Use: No  . Sexual Activity: Not on file   Other Topics Concern  . Not on file   Social History Narrative   Lives in [place].  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 Coronary artery disease Father 35   Physical Exam: Filed Vitals:   05/19/14 1338  BP: 132/62  Pulse: 100  Resp: 14  Height: 5\' 3"  (1.6 m)  Weight: 162 lb 12.8 oz (73.846 kg)  SpO2: 98%    GEN-  The patient is well appearing, alert and oriented x 3 today.   Head- normocephalic, atraumatic Eyes-  Sclera clear, conjunctiva pink Ears- hearing intact Oropharynx- clear Neck- supple, no JVP Lymph- no cervical lymphadenopathy Lungs- Clear to ausculation bilaterally, normal work of breathing Heart- Irregular rate and rhythm, no murmurs, rubs or gallops, PMI not laterally displaced. Pacer site well healed. GI- soft, NT, ND, + BS Extremities- no clubbing, cyanosis, or edema   EKG today reveals afib 98 bpm, QRS 175ms,Qtc 453ms. with st/t wave abnormality. unchanged from previous ,(.[facility] 4/12 with normal coronary arteries.) Epic records reviewed in detail.    Assessment and Plan: 1. Afib with h/o symptomatic bradycardia/syncopy S/P PPM, no further syncope.  Pacer site healed.  Currently rate controlled, but symptomatic from afib with fatigue and wishing to be back in SR. Continue cardizem Increase flecainide to 100 mg bid per previous conversation with Dr. Rayann Heman. E[facility] in one week at [facility]. If has not chemically converted, schedule patient for DCCV. INR's have been in range for months.  Continue warfarin.  F/u as scheduled with  Dr. Lovena Le 3/22.

## 2014-05-19 NOTE — Patient Instructions (Signed)
Your physician has recommended you make the following change in your medication:  INCREASE FLECAINIDE FROM 50 MG TO 100 MG TWICE DAILY, DOSE TO BE TAKEN 12 HOURS APART.  Your physician wants you to follow-up ON Thursday 05/25/14 @ 9AM FOR A TRIAGE NURSE VISIT. IF YOU ARE IN AFIB YOU WILL BE SET UP FOR A CARDIOVERSION.

## 2014-05-20 DIAGNOSIS — M170 Bilateral primary osteoarthritis of knee: Secondary | ICD-10-CM | POA: Diagnosis not present

## 2014-05-20 NOTE — Addendum Note (Signed)
Encounter addended by: Vanessa Barbara, CCT on: 05/20/2014  1:14 PM<BR>     Documentation filed: Charges VN

## 2014-05-22 ENCOUNTER — Encounter: Payer: Self-pay | Admitting: Internal Medicine

## 2014-05-25 ENCOUNTER — Ambulatory Visit (INDEPENDENT_AMBULATORY_CARE_PROVIDER_SITE_OTHER): Payer: Medicare Other

## 2014-05-25 ENCOUNTER — Encounter (HOSPITAL_COMMUNITY): Payer: Self-pay | Admitting: *Deleted

## 2014-05-25 ENCOUNTER — Other Ambulatory Visit (INDEPENDENT_AMBULATORY_CARE_PROVIDER_SITE_OTHER): Payer: Medicare Other | Admitting: *Deleted

## 2014-05-25 ENCOUNTER — Other Ambulatory Visit: Payer: Self-pay | Admitting: Internal Medicine

## 2014-05-25 ENCOUNTER — Ambulatory Visit (HOSPITAL_COMMUNITY): Payer: Medicare Other | Admitting: Anesthesiology

## 2014-05-25 ENCOUNTER — Telehealth (HOSPITAL_COMMUNITY): Payer: Self-pay | Admitting: Nurse Practitioner

## 2014-05-25 ENCOUNTER — Emergency Department (HOSPITAL_COMMUNITY)
Admission: EM | Admit: 2014-05-25 | Discharge: 2014-05-25 | Disposition: A | Discharge status: Home / Self Care | Payer: Medicare Other | Attending: Emergency Medicine | Admitting: Emergency Medicine

## 2014-05-25 VITALS — BP 152/84 | HR 120 | Resp 14

## 2014-05-25 DIAGNOSIS — Z8669 Personal history of other diseases of the nervous system and sense organs: Secondary | ICD-10-CM | POA: Insufficient documentation

## 2014-05-25 DIAGNOSIS — R000 Tachycardia, unspecified: Secondary | ICD-10-CM | POA: Diagnosis not present

## 2014-05-25 DIAGNOSIS — I10 Essential (primary) hypertension: Secondary | ICD-10-CM | POA: Diagnosis not present

## 2014-05-25 DIAGNOSIS — N184 Chronic kidney disease, stage 4 (severe): Secondary | ICD-10-CM | POA: Insufficient documentation

## 2014-05-25 DIAGNOSIS — M109 Gout, unspecified: Secondary | ICD-10-CM | POA: Insufficient documentation

## 2014-05-25 DIAGNOSIS — R739 Hyperglycemia, unspecified: Secondary | ICD-10-CM

## 2014-05-25 DIAGNOSIS — Z79899 Other long term (current) drug therapy: Secondary | ICD-10-CM | POA: Insufficient documentation

## 2014-05-25 DIAGNOSIS — E1165 Type 2 diabetes mellitus with hyperglycemia: Secondary | ICD-10-CM | POA: Diagnosis not present

## 2014-05-25 DIAGNOSIS — Z8719 Personal history of other diseases of the digestive system: Secondary | ICD-10-CM | POA: Insufficient documentation

## 2014-05-25 DIAGNOSIS — I481 Persistent atrial fibrillation: Secondary | ICD-10-CM | POA: Diagnosis not present

## 2014-05-25 DIAGNOSIS — I129 Hypertensive chronic kidney disease with stage 1 through stage 4 chronic kidney disease, or unspecified chronic kidney disease: Secondary | ICD-10-CM | POA: Insufficient documentation

## 2014-05-25 DIAGNOSIS — E782 Mixed hyperlipidemia: Secondary | ICD-10-CM | POA: Diagnosis not present

## 2014-05-25 DIAGNOSIS — Z950 Presence of cardiac pacemaker: Secondary | ICD-10-CM | POA: Diagnosis not present

## 2014-05-25 DIAGNOSIS — I4891 Unspecified atrial fibrillation: Secondary | ICD-10-CM | POA: Diagnosis not present

## 2014-05-25 DIAGNOSIS — Z7901 Long term (current) use of anticoagulants: Secondary | ICD-10-CM | POA: Diagnosis not present

## 2014-05-25 DIAGNOSIS — R799 Abnormal finding of blood chemistry, unspecified: Secondary | ICD-10-CM | POA: Diagnosis present

## 2014-05-25 LAB — URINALYSIS, ROUTINE W REFLEX MICROSCOPIC
Bilirubin Urine: NEGATIVE
HGB URINE DIPSTICK: NEGATIVE
Ketones, ur: NEGATIVE mg/dL
Leukocytes, UA: NEGATIVE
Nitrite: NEGATIVE
PROTEIN: NEGATIVE mg/dL
UROBILINOGEN UA: 0.2 mg/dL (ref 0.0–1.0)
pH: 5 (ref 5.0–8.0)

## 2014-05-25 LAB — CBC WITH DIFFERENTIAL/PLATELET
BASOS ABS: 0 10*3/uL (ref 0.0–0.1)
Basophils Absolute: 0 10*3/uL (ref 0.0–0.1)
Basophils Relative: 0 % (ref 0.0–3.0)
Basophils Relative: 0 % (ref 0–1)
EOS ABS: 0.1 10*3/uL (ref 0.0–0.7)
EOS PCT: 0.2 % (ref 0.0–5.0)
Eosinophils Absolute: 0 10*3/uL (ref 0.0–0.7)
Eosinophils Relative: 1 % (ref 0–5)
HCT: 47.2 % — ABNORMAL HIGH (ref 36.0–46.0)
HCT: 46.6 % — ABNORMAL HIGH (ref 36.0–46.0)
HEMOGLOBIN: 15.8 g/dL — AB (ref 12.0–15.0)
Hemoglobin: 16.8 g/dL — ABNORMAL HIGH (ref 12.0–15.0)
LYMPHS PCT: 7.4 % — AB (ref 12.0–46.0)
Lymphocytes Relative: 14 % (ref 12–46)
Lymphs Abs: 1.1 10*3/uL (ref 0.7–4.0)
Lymphs Abs: 1.7 10*3/uL (ref 0.7–4.0)
MCH: 33.4 pg (ref 26.0–34.0)
MCHC: 33.4 g/dL (ref 30.0–36.0)
MCHC: 36.1 g/dL — ABNORMAL HIGH (ref 30.0–36.0)
MCV: 95 fl (ref 78.0–100.0)
MCV: 92.6 fL (ref 78.0–100.0)
MONOS PCT: 10 % (ref 3–12)
Monocytes Absolute: 1.1 10*3/uL — ABNORMAL HIGH (ref 0.1–1.0)
Monocytes Absolute: 1.3 10*3/uL — ABNORMAL HIGH (ref 0.1–1.0)
Monocytes Relative: 7.4 % (ref 3.0–12.0)
NEUTROS ABS: 9.3 10*3/uL — AB (ref 1.7–7.7)
Neutro Abs: 12.8 10*3/uL — ABNORMAL HIGH (ref 1.4–7.7)
Neutrophils Relative %: 85 % — ABNORMAL HIGH (ref 43.0–77.0)
Neutrophils Relative %: 75 % (ref 43–77)
PLATELETS: 265 10*3/uL (ref 150.0–400.0)
PLATELETS: 234 10*3/uL (ref 150–400)
RBC: 4.97 Mil/uL (ref 3.87–5.11)
RBC: 5.03 MIL/uL (ref 3.87–5.11)
RDW: 13.9 % (ref 11.5–15.5)
RDW: 13.6 % (ref 11.5–15.5)
WBC: 15.1 10*3/uL — ABNORMAL HIGH (ref 4.0–10.5)
WBC: 12.3 10*3/uL — AB (ref 4.0–10.5)

## 2014-05-25 LAB — BASIC METABOLIC PANEL
BUN: 57 mg/dL — ABNORMAL HIGH (ref 6–23)
CO2: 26 mEq/L (ref 19–32)
Calcium: 9.6 mg/dL (ref 8.4–10.5)
Chloride: 94 mEq/L — ABNORMAL LOW (ref 96–112)
Creatinine, Ser: 2.2 mg/dL — ABNORMAL HIGH (ref 0.40–1.20)
GFR: 22.97 mL/min — ABNORMAL LOW (ref >60.00)
GLUCOSE: 586 mg/dL — AB (ref 70–99)
POTASSIUM: 5.8 meq/L — AB (ref 3.5–5.1)
Sodium: 128 mEq/L — ABNORMAL LOW (ref 135–145)

## 2014-05-25 LAB — I-STAT VENOUS BLOOD GAS, ED
Bicarbonate: 22 mEq/L (ref 20.0–24.0)
O2 Saturation: 92 %
PH VEN: 7.474 — AB (ref 7.250–7.300)
PO2 VEN: 58 mmHg — AB (ref 30.0–45.0)
TCO2: 23 mmol/L (ref 0–100)
pCO2, Ven: 29.9 mmHg — ABNORMAL LOW (ref 45.0–50.0)

## 2014-05-25 LAB — COMPREHENSIVE METABOLIC PANEL
ALT: 26 U/L (ref 0–35)
ANION GAP: 11 (ref 5–15)
AST: 19 U/L (ref 0–37)
Albumin: 3.9 g/dL (ref 3.5–5.2)
Alkaline Phosphatase: 123 U/L — ABNORMAL HIGH (ref 39–117)
BUN: 57 mg/dL — ABNORMAL HIGH (ref 6–23)
CO2: 23 mmol/L (ref 19–32)
CREATININE: 2.24 mg/dL — AB (ref 0.50–1.10)
Calcium: 9.9 mg/dL (ref 8.4–10.5)
Chloride: 97 mEq/L (ref 96–112)
GFR calc Af Amer: 23 mL/min — ABNORMAL LOW (ref >90)
GFR calc non Af Amer: 20 mL/min — ABNORMAL LOW (ref >90)
GLUCOSE: 490 mg/dL — AB (ref 70–99)
Potassium: 5.3 mmol/L — ABNORMAL HIGH (ref 3.5–5.1)
Sodium: 131 mmol/L — ABNORMAL LOW (ref 135–145)
TOTAL PROTEIN: 6.9 g/dL (ref 6.0–8.3)
Total Bilirubin: 0.8 mg/dL (ref 0.3–1.2)

## 2014-05-25 LAB — CBG MONITORING, ED
GLUCOSE-CAPILLARY: 324 mg/dL — AB (ref 70–99)
Glucose-Capillary: 477 mg/dL — ABNORMAL HIGH (ref 70–99)
Glucose-Capillary: 470 mg/dL — ABNORMAL HIGH (ref 70–99)
Glucose-Capillary: 157 mg/dL — ABNORMAL HIGH (ref 70–99)

## 2014-05-25 LAB — URINE MICROSCOPIC-ADD ON

## 2014-05-25 LAB — PROTIME-INR
INR: 2.22 — AB (ref 0.00–1.49)
PROTHROMBIN TIME: 24.8 s — AB (ref 11.6–15.2)

## 2014-05-25 MED ORDER — SODIUM CHLORIDE 0.9 % IV BOLUS (SEPSIS)
1000.0000 mL | Freq: Once | INTRAVENOUS | Status: AC
  Administered 2014-05-25: 1000 mL via INTRAVENOUS

## 2014-05-25 MED ORDER — INSULIN ASPART 100 UNIT/ML ~~LOC~~ SOLN
10.0000 [IU] | Freq: Once | SUBCUTANEOUS | Status: AC
  Administered 2014-05-25: 10 [IU] via SUBCUTANEOUS
  Filled 2014-05-25: qty 1

## 2014-05-25 MED ORDER — INSULIN ASPART 100 UNIT/ML ~~LOC~~ SOLN
5.0000 [IU] | Freq: Once | SUBCUTANEOUS | Status: AC
  Administered 2014-05-25: 5 [IU] via SUBCUTANEOUS
  Filled 2014-05-25: qty 1

## 2014-05-25 NOTE — Progress Notes (Signed)
1.) Reason for visit: EKG after Flecainide increase  Name of MD requesting visit: Marlise Eves MD 2.) H&P: atrial Fib with RVR  3.) ROS related to problem: patient reports feeling shaky and a little SOB  4.) Assessment and plan per MD: Per Roderic Palau and Dr. Lovena Le, scheduled for DCCV 05/26/2014 at 10:00am             Labs today for cardioversion, plus a Flecainide level.

## 2014-05-25 NOTE — Anesthesia Preprocedure Evaluation (Deleted)
Anesthesia Evaluation  Patient identified by MRN, date of birth, ID band Patient awake    Reviewed: Allergy & Precautions, NPO status , reviewed documented beta blocker date and time   Airway        Dental   Pulmonary          Cardiovascular hypertension, Pt. on medications  2012 EF 70%    Neuro/Psych    GI/Hepatic GERD-  Medicated,  Endo/Other  diabetes, Type 2  Renal/GU Renal InsufficiencyRenal diseaseGFR 20     Musculoskeletal   Abdominal   Peds  Hematology   Anesthesia Other Findings   Reproductive/Obstetrics                             Anesthesia Physical Anesthesia Plan  ASA: III  Anesthesia Plan: MAC   Post-op Pain Management:    Induction: Intravenous  Airway Management Planned: Nasal Cannula  Additional Equipment:   Intra-op Plan:   Post-operative Plan:   Informed Consent: I have reviewed the patients History and Physical, chart, labs and discussed the procedure including the risks, benefits and alternatives for the proposed anesthesia with the patient or authorized representative who has indicated his/her understanding and acceptance.     Plan Discussed with:   Anesthesia Plan Comments:         Anesthesia Quick Evaluation

## 2014-05-25 NOTE — Telephone Encounter (Signed)
Informed pt that her labs were abnormal and could not go ahead with planned DCCV in am if Kt and Blood sugar are not addressed and corrected. Discussed with dr. Rayann Heman who felt pt should report to the Er for further treatment. She reports the she has been thirsty and urinating more than usual.received a cortisone shot in her knee last few days which may have aggravated blood sugar.

## 2014-05-25 NOTE — Patient Instructions (Signed)
You have been scheduled for a cardioversion Friday 05/26/2014 at 10:00am.  Please go to the lab today.  Instruction letter printed for you.

## 2014-05-25 NOTE — ED Notes (Signed)
Pt in after follow up appointment with her cardiologist today, lab work was completed and they called her and old her the glucose levels were too high and to come here for evaluation

## 2014-05-25 NOTE — ED Provider Notes (Signed)
CSN: [id number]     Arrival date & time 05/25/14  1524 History   First MD Initiated Contact with Patient 05/25/14 1701     Chief Complaint  Patient presents with  . Abnormal Lab     (Consider location/radiation/quality/duration/timing/severity/associated sxs/prior Treatment) HPI Comments: The patient is a 80 year old female, history of paroxysmal atrial fibrillation who has been controlled with medications in the past. She went into atrial fibrillation one month ago, she is scheduled for cardioversion in the morning however she went to her preop labs today and was found to be hyperglycemic with a blood sugar close to 600, hyponatremia and hyperkalemia at 5.8. She was redirected to the emergency department for further evaluation and treatment. The patient states that she received a cortisone shot in her knee approximately 5 days ago and since that time his head a progressive generalized weakness and fatigue, increased thirst but denies any chest pain shortness of breath back pain swelling of the legs headache neck pain sore throat cough abdominal pain. She has had a normal appetite, has been taking her oral diabetic medications and endorses ongoing palpitations.  The history is provided by the patient, the spouse and medical records.    Past Medical History  Diagnosis Date  . Essential hypertension   . Gout   . GERD (gastroesophageal reflux disease)   . Insomnia   . Atrial fibrillation   . Type 2 diabetes mellitus   . Allergic rhinitis   . Hyperlipemia, mixed   . Chronic renal disease, stage IV    Past Surgical History  Procedure Laterality Date  . Cesarean section      x2  . Esophageal dilation    . Permanent pacemaker insertion N/A 04/26/2014    Procedure: PERMANENT PACEMAKER INSERTION;  Surgeon: Evans Lance, MD;  Location: [facility] CATH LAB;  Service: Cardiovascular;  Laterality: N/A;   Family History  Problem Relation Age of Onset  . Coronary artery disease Father 59   History   Substance Use Topics  . Smoking status: Never Smoker   . Smokeless tobacco: Not on file  . Alcohol Use: No   OB History    No data available     Review of Systems  All other systems reviewed and are negative.     Allergies  Cortisone and Prednisone  Home Medications   Prior to Admission medications   Medication Sig Start Date End Date Taking? Authorizing Provider  allopurinol (ZYLOPRIM) 100 MG tablet Take 100 mg by mouth at bedtime.   Yes Historical Provider, MD  atorvastatin (LIPITOR) 80 MG tablet Take 80 mg by mouth at bedtime.    Yes Historical Provider, MD  benazepril (LOTENSIN) 20 MG tablet Take 20 mg by mouth daily.   Yes Historical Provider, MD  diltiazem (CARDIZEM CD) 240 MG 24 hr capsule Take 1 capsule (240 mg total) by mouth daily. 04/27/14  Yes Rhonda G Barrett, PA-C  flecainide (TAMBOCOR) 100 MG tablet TAKE 1 TABLET BY MOUTH EVERY 12 HOURS 05/19/14  Yes Roderic Palau, NP  furosemide (LASIX) 40 MG tablet Take 40 mg by mouth daily.   Yes Historical Provider, MD  KLOR-CON M20 20 MEQ tablet Take 20 mEq by mouth daily. 04/09/14  Yes Historical Provider, MD  levothyroxine (SYNTHROID, LEVOTHROID) 50 MCG tablet Take 50 mcg by mouth daily. On a empty stomach.   Yes Historical Provider, MD  linagliptin (TRADJENTA) 5 MG TABS tablet Take 5 mg by mouth daily.   Yes Historical Provider, MD  warfarin (COUMADIN)  5 MG tablet Take as directed by coumadin clinic Patient taking differently: Take 2.5-5 mg by mouth daily. Take 1 tablet (5 mg) on (Mon, Wed) & Take 1/2 tablet (2.5 mg) on (Tues, Thurs, Fri, Sat & Sun). 12/22/13  Yes Jettie Booze, MD  repaglinide (PRANDIN) 0.5 MG tablet Take 2 tablets (1 mg total) by mouth 2 (two) times daily before a meal. 05/26/14   [name], MD   BP 145/75 mmHg  Pulse 81  Temp(Src) 98 F (36.7 C) (Oral)  Resp 19  SpO2 96% Physical Exam  Constitutional: She appears well-developed and well-nourished. No distress.  HENT:  Head:  Normocephalic and atraumatic.  Mouth/Throat: Oropharynx is clear and moist. No oropharyngeal exudate.  Eyes: Conjunctivae and EOM are normal. Pupils are equal, round, and reactive to light. Right eye exhibits no discharge. Left eye exhibits no discharge. No scleral icterus.  Neck: Normal range of motion. Neck supple. No JVD present. No thyromegaly present.  Cardiovascular: Regular rhythm, normal heart sounds and intact distal pulses.  Exam reveals no gallop and no friction rub.   No murmur heard. Mild tachycardia, irregularly irregular rhythm, strong pulses at the radial arteries, no JVD  Pulmonary/Chest: Effort normal and breath sounds normal. No respiratory distress. She has no wheezes. She has no rales.  Abdominal: Soft. Bowel sounds are normal. She exhibits no distension and no mass. There is no tenderness.  Musculoskeletal: Normal range of motion. She exhibits no edema or tenderness.  Lymphadenopathy:    She has no cervical adenopathy.  Neurological: She is alert. Coordination normal.  Skin: Skin is warm and dry. No rash noted. No erythema.  Psychiatric: She has a normal mood and affect. Her behavior is normal.  Nursing note and vitals reviewed.   ED Course  Procedures (including critical care time) Labs Review Labs Reviewed  CBC WITH DIFFERENTIAL - Abnormal; Notable for the following:    WBC 12.3 (*)    Hemoglobin 16.8 (*)    HCT 46.6 (*)    MCHC 36.1 (*)    Neutro Abs 9.3 (*)    Monocytes Absolute 1.3 (*)    All other components within normal limits  COMPREHENSIVE METABOLIC PANEL - Abnormal; Notable for the following:    Sodium 131 (*)    Potassium 5.3 (*)    Glucose, Bld 490 (*)    BUN 57 (*)    Creatinine, Ser 2.24 (*)    Alkaline Phosphatase 123 (*)    GFR calc non Af Amer 20 (*)    GFR calc Af Amer 23 (*)    All other components within normal limits  URINALYSIS, ROUTINE W REFLEX MICROSCOPIC - Abnormal; Notable for the following:    Specific Gravity, Urine >1.030  (*)    Glucose, UA >1000 (*)    All other components within normal limits  PROTIME-INR - Abnormal; Notable for the following:    Prothrombin Time 24.8 (*)    INR 2.22 (*)    All other components within normal limits  URINE MICROSCOPIC-ADD ON - Abnormal; Notable for the following:    Casts HYALINE CASTS (*)    All other components within normal limits  CBG MONITORING, ED - Abnormal; Notable for the following:    Glucose-Capillary 477 (*)    All other components within normal limits  I-STAT VENOUS BLOOD GAS, ED - Abnormal; Notable for the following:    pH, Ven 7.474 (*)    pCO2, Ven 29.9 (*)    pO2, Ven 58.0 (*)  All other components within normal limits  CBG MONITORING, ED - Abnormal; Notable for the following:    Glucose-Capillary 470 (*)    All other components within normal limits  CBG MONITORING, ED - Abnormal; Notable for the following:    Glucose-Capillary 324 (*)    All other components within normal limits  CBG MONITORING, ED - Abnormal; Notable for the following:    Glucose-Capillary 157 (*)    All other components within normal limits  CBG MONITORING, ED    Imaging Review No results found.   EKG Interpretation   Date/Time:  Thursday May 25 2014 17:16:03 EST Ventricular Rate:  121 PR Interval:  87 QRS Duration: 123 QT Interval:  366 QTC Calculation: 519 R Axis:   -42 Text Interpretation:  Atrial fibrillation with rapid ventricular response  Left bundle branch block ST depression V1-V3, suggest recording posterior  leads Abnormal ekg since last tracing no significant change Confirmed by  Cedrick Partain  MD, Amandajo Gonder ([id number]) on 05/25/2014 5:19:27 PM      MDM   Final diagnoses:  Hyperglycemia    Other than atrial fibrillation the patient has no other acute findings on exam. Her glucose monitoring here is 477, she will need evaluation and treatment, suspect steroid exposure as source of the patient's hyperglycemia, no evidence of DKA on prior labs, recheck,  patient well-appearing otherwise.  CBG gradually improved back to ~ 150 - her Afib was rate controlled on d/c without significant intervention.  STable for eval tomorrow morning by cardiology for cardioversion - pt given reassurance.  Seh is asx on d/c.  Meds given in ED:  Medications  sodium chloride 0.9 % bolus 1,000 mL (0 mLs Intravenous Stopped 05/25/14 2204)  insulin aspart (novoLOG) injection 10 Units (10 Units Subcutaneous Given 05/25/14 1737)  insulin aspart (novoLOG) injection 5 Units (5 Units Subcutaneous Given 05/25/14 2006)    Discharge Medication List as of 05/25/2014 11:08 PM        Johnna Acosta, MD 05/26/14 [phone]

## 2014-05-25 NOTE — Discharge Instructions (Signed)
Please call your doctor for a followup appointment within 24-48 hours. When you talk to your doctor please let them know that you were seen in the emergency department and have them acquire all of your records so that they can discuss the findings with you and formulate a treatment plan to fully care for your new and ongoing problems. ° °

## 2014-05-26 ENCOUNTER — Encounter (HOSPITAL_COMMUNITY): Payer: Self-pay | Admitting: Critical Care Medicine

## 2014-05-26 ENCOUNTER — Encounter (HOSPITAL_COMMUNITY)
Admission: RE | Disposition: A | Discharge status: Home / Self Care | Payer: Self-pay | Source: Ambulatory Visit | Attending: Internal Medicine

## 2014-05-26 ENCOUNTER — Observation Stay (HOSPITAL_BASED_OUTPATIENT_CLINIC_OR_DEPARTMENT_OTHER)
Admission: RE | Admit: 2014-05-26 | Discharge: 2014-05-29 | Disposition: A | Discharge status: Home / Self Care | Payer: Medicare Other | Source: Ambulatory Visit | Attending: Internal Medicine | Admitting: Internal Medicine

## 2014-05-26 DIAGNOSIS — N179 Acute kidney failure, unspecified: Secondary | ICD-10-CM | POA: Diagnosis present

## 2014-05-26 DIAGNOSIS — N183 Chronic kidney disease, stage 3 (moderate): Secondary | ICD-10-CM

## 2014-05-26 DIAGNOSIS — I4891 Unspecified atrial fibrillation: Secondary | ICD-10-CM | POA: Diagnosis present

## 2014-05-26 DIAGNOSIS — E039 Hypothyroidism, unspecified: Secondary | ICD-10-CM | POA: Diagnosis present

## 2014-05-26 DIAGNOSIS — I480 Paroxysmal atrial fibrillation: Secondary | ICD-10-CM | POA: Diagnosis not present

## 2014-05-26 DIAGNOSIS — E1165 Type 2 diabetes mellitus with hyperglycemia: Secondary | ICD-10-CM | POA: Diagnosis not present

## 2014-05-26 DIAGNOSIS — I10 Essential (primary) hypertension: Secondary | ICD-10-CM | POA: Diagnosis present

## 2014-05-26 DIAGNOSIS — E785 Hyperlipidemia, unspecified: Secondary | ICD-10-CM | POA: Diagnosis present

## 2014-05-26 DIAGNOSIS — IMO0002 Reserved for concepts with insufficient information to code with codable children: Secondary | ICD-10-CM | POA: Diagnosis present

## 2014-05-26 DIAGNOSIS — R739 Hyperglycemia, unspecified: Secondary | ICD-10-CM

## 2014-05-26 DIAGNOSIS — E875 Hyperkalemia: Secondary | ICD-10-CM | POA: Diagnosis present

## 2014-05-26 DIAGNOSIS — K219 Gastro-esophageal reflux disease without esophagitis: Secondary | ICD-10-CM | POA: Diagnosis present

## 2014-05-26 LAB — BASIC METABOLIC PANEL
Anion gap: 15 (ref 5–15)
BUN: 49 mg/dL — ABNORMAL HIGH (ref 6–23)
CO2: 19 mmol/L (ref 19–32)
CREATININE: 2.08 mg/dL — AB (ref 0.50–1.10)
Calcium: 9.7 mg/dL (ref 8.4–10.5)
Chloride: 98 mEq/L (ref 96–112)
GFR calc non Af Amer: 22 mL/min — ABNORMAL LOW (ref >90)
GFR, EST AFRICAN AMERICAN: 25 mL/min — AB (ref >90)
Glucose, Bld: 427 mg/dL — ABNORMAL HIGH (ref 70–99)
POTASSIUM: 5.3 mmol/L — AB (ref 3.5–5.1)
SODIUM: 132 mmol/L — AB (ref 135–145)

## 2014-05-26 LAB — GLUCOSE, CAPILLARY
GLUCOSE-CAPILLARY: 396 mg/dL — AB (ref 70–99)
GLUCOSE-CAPILLARY: 373 mg/dL — AB (ref 70–99)
GLUCOSE-CAPILLARY: 339 mg/dL — AB (ref 70–99)
Glucose-Capillary: 298 mg/dL — ABNORMAL HIGH (ref 70–99)
Glucose-Capillary: 139 mg/dL — ABNORMAL HIGH (ref 70–99)

## 2014-05-26 LAB — HEMOGLOBIN A1C
Hgb A1c MFr Bld: 10.3 % — ABNORMAL HIGH (ref <5.7)
Mean Plasma Glucose: 249 mg/dL — ABNORMAL HIGH (ref <117)

## 2014-05-26 SURGERY — CANCELLED PROCEDURE
Anesthesia: Monitor Anesthesia Care

## 2014-05-26 MED ORDER — INSULIN ASPART 100 UNIT/ML ~~LOC~~ SOLN
4.0000 [IU] | Freq: Three times a day (TID) | SUBCUTANEOUS | Status: DC
  Administered 2014-05-26 – 2014-05-28 (×7): 4 [IU] via SUBCUTANEOUS

## 2014-05-26 MED ORDER — WARFARIN - PHARMACIST DOSING INPATIENT
Freq: Every day | Status: DC
  Administered 2014-05-26 – 2014-05-28 (×2)

## 2014-05-26 MED ORDER — WARFARIN SODIUM 5 MG PO TABS: 5.0000 mg | ORAL_TABLET | ORAL | Status: DC

## 2014-05-26 MED ORDER — LINAGLIPTIN 5 MG PO TABS
5.0000 mg | ORAL_TABLET | Freq: Every day | ORAL | Status: DC
  Administered 2014-05-26 – 2014-05-29 (×4): 5 mg via ORAL
  Filled 2014-05-26 (×4): qty 1

## 2014-05-26 MED ORDER — HEPARIN SODIUM (PORCINE) 5000 UNIT/ML IJ SOLN: 5000.0000 [IU] | Freq: Three times a day (TID) | INTRAMUSCULAR | Status: DC

## 2014-05-26 MED ORDER — INSULIN ASPART 100 UNIT/ML ~~LOC~~ SOLN: 4.0000 [IU] | Freq: Three times a day (TID) | SUBCUTANEOUS | Status: DC

## 2014-05-26 MED ORDER — LEVOTHYROXINE SODIUM 50 MCG PO TABS
50.0000 ug | ORAL_TABLET | Freq: Every day | ORAL | Status: DC
  Administered 2014-05-27 – 2014-05-28 (×2): 50 ug via ORAL
  Filled 2014-05-26 (×4): qty 1

## 2014-05-26 MED ORDER — FLECAINIDE ACETATE 50 MG PO TABS
50.0000 mg | ORAL_TABLET | Freq: Two times a day (BID) | ORAL | Status: DC
  Administered 2014-05-26 – 2014-05-29 (×7): 50 mg via ORAL
  Filled 2014-05-26 (×8): qty 1

## 2014-05-26 MED ORDER — INSULIN ASPART 100 UNIT/ML ~~LOC~~ SOLN
8.0000 [IU] | Freq: Once | SUBCUTANEOUS | Status: AC
  Administered 2014-05-26: 8 [IU] via SUBCUTANEOUS

## 2014-05-26 MED ORDER — ONDANSETRON HCL 4 MG PO TABS: 4.0000 mg | ORAL_TABLET | Freq: Four times a day (QID) | ORAL | Status: DC | PRN

## 2014-05-26 MED ORDER — SODIUM CHLORIDE 0.9 % IV SOLN
INTRAVENOUS | Status: DC
  Administered 2014-05-26: 09:00:00 via INTRAVENOUS

## 2014-05-26 MED ORDER — SODIUM CHLORIDE 0.9 % IJ SOLN
3.0000 mL | Freq: Two times a day (BID) | INTRAMUSCULAR | Status: DC
  Administered 2014-05-26 – 2014-05-28 (×5): 3 mL via INTRAVENOUS

## 2014-05-26 MED ORDER — INSULIN ASPART 100 UNIT/ML ~~LOC~~ SOLN: 0.0000 [IU] | Freq: Every day | SUBCUTANEOUS | Status: DC

## 2014-05-26 MED ORDER — REPAGLINIDE 0.5 MG PO TABS: 1.0000 mg | ORAL_TABLET | Freq: Two times a day (BID) | ORAL | Status: DC

## 2014-05-26 MED ORDER — ACETAMINOPHEN 650 MG RE SUPP: 650.0000 mg | Freq: Four times a day (QID) | RECTAL | Status: DC | PRN

## 2014-05-26 MED ORDER — WARFARIN SODIUM 2.5 MG PO TABS
2.5000 mg | ORAL_TABLET | ORAL | Status: AC
  Administered 2014-05-26: 2.5 mg via ORAL
  Filled 2014-05-26: qty 1

## 2014-05-26 MED ORDER — INSULIN DETEMIR 100 UNIT/ML ~~LOC~~ SOLN
30.0000 [IU] | Freq: Every day | SUBCUTANEOUS | Status: DC
  Administered 2014-05-26 – 2014-05-28 (×3): 30 [IU] via SUBCUTANEOUS
  Filled 2014-05-26 (×4): qty 0.3

## 2014-05-26 MED ORDER — ACETAMINOPHEN 325 MG PO TABS: 650.0000 mg | ORAL_TABLET | Freq: Four times a day (QID) | ORAL | Status: DC | PRN

## 2014-05-26 MED ORDER — ALLOPURINOL 100 MG PO TABS
100.0000 mg | ORAL_TABLET | Freq: Every day | ORAL | Status: DC
  Administered 2014-05-26 – 2014-05-28 (×3): 100 mg via ORAL
  Filled 2014-05-26 (×4): qty 1

## 2014-05-26 MED ORDER — INSULIN ASPART 100 UNIT/ML ~~LOC~~ SOLN
0.0000 [IU] | Freq: Three times a day (TID) | SUBCUTANEOUS | Status: DC
Start: 2014-05-27 — End: 2014-05-29
  Administered 2014-05-26: 15 [IU] via SUBCUTANEOUS
  Administered 2014-05-27: 3 [IU] via SUBCUTANEOUS
  Administered 2014-05-28: 4 [IU] via SUBCUTANEOUS
  Administered 2014-05-28: 3 [IU] via SUBCUTANEOUS

## 2014-05-26 MED ORDER — ONDANSETRON HCL 4 MG/2ML IJ SOLN: 4.0000 mg | Freq: Four times a day (QID) | INTRAMUSCULAR | Status: DC | PRN

## 2014-05-26 MED ORDER — ATORVASTATIN CALCIUM 80 MG PO TABS
80.0000 mg | ORAL_TABLET | Freq: Every day | ORAL | Status: DC
  Administered 2014-05-26 – 2014-05-28 (×3): 80 mg via ORAL
  Filled 2014-05-26 (×4): qty 1

## 2014-05-26 MED ORDER — HYDRALAZINE HCL 20 MG/ML IJ SOLN: 10.0000 mg | Freq: Four times a day (QID) | INTRAMUSCULAR | Status: DC | PRN

## 2014-05-26 NOTE — Progress Notes (Signed)
Mrs. Josephson presented for cardioversion today. Her pre-procedure labs were markedly abnormal yesterday with very high glucose and elevated potassium. She was sent to the ER and given insulin and IV fluids. She attributes this to a depot steroid injection last week - however, it seems unusual that she still has elevated glucose. She did not eat today, however, her BG was over 300. I suspect this is new insulin-requiring diabetes. Given the poor sugar control, it seems unlikely for her to hold sinus rhythm. I would recommend better BG control before performing elective cardioversion. She is clinically stable in a-fib, therapeutically anticoagulated and there is no urgency to perform cardioversion. I agree with giving insulin now - will adminster 500 cc IV fluids. Please provide a snack and encourage lunch at home. She is on tradjenta and repaglinide. Will increase repaglinide to 1 mg BID AC. May need to be evaluate for insulin - encouraged follow-up with PCP early next week.   [name], MD, [facility] Attending Cardiologist [place]

## 2014-05-26 NOTE — H&P (Addendum)
[facility] History and Physical  Kathryn Richardson [id number] DOB: June 22, 1934 DOA: 05/26/2014  Referring physician: Dr Debara Pickett PCP: Gara Kroner, MD   Chief Complaint:  hypergylcemia    HPI:  80 year old female wit history of type 2 diabetes mellitus, paroxysmal A. fib with hospitalization in November to cardiology service status dual chamber  pacemaker placement, now on coumadin, chronic kidney disease stage III ( baseline creatinine around 1.7, follows with nephrologist at [facility]) , hypertension, GERD, hypothyroidism presented for cardioversion today. During preprocedure evaluation yesterday she was found to hypoglycemic with blood sugar in 500s. She was sent to the ED where she was given some insulin and once the blood glucose was controlled and she was sent home. Patient reports getting a cortisone injection in her knee on 1/09. Denies polyuria or polydipsia. She denies checking her blood glucose at home for the last few days.  Patient denies headache, dizziness, fever, chills, nausea , vomiting, chest pain, palpitations, SOB, abdominal pain, bowel or urinary symptoms. Denies change in weight or appetite.  In the endoscopy area she was found to be hypertensive systolic blood pressure in 180s. (Patient had not taken her blood pressure medications today). Blood work done showed blood glucose again in 400s with normal anion gap and potassium of 5.3. Cardioversion was aborted and hospitalist admission requested for patient monitoring and  blood sugar control. Patient is on repaglinide and Tradjenta at home and her last A1c was 7.6 in November 2015. On arrival to the floor her blood pressure was 157/84, afebrile , HR of 85 and O2 sat of 96% on room air.   Review of Systems:  Constitutional: Denies fever, chills, diaphoresis, appetite change and fatigue.  HEENT: Denies visual  or hearing symptoms, congestion,  trouble swallowing, neck pain, neck stiffness and tinnitus.   Respiratory:  Denies SOB, DOE, cough, chest tightness,  and wheezing.   Cardiovascular: Denies chest pain, palpitations and leg swelling.  Gastrointestinal: Denies nausea, vomiting, abdominal pain, diarrhea,  and abdominal distention.  Genitourinary: Denies dysuria, increased urinary frequency, hematuria, flank pain and difficulty urinating.  Endocrine: Denies hot or cold intolerance,  polyuria, polydipsia. Musculoskeletal: Denies myalgias, back pain, joint pain Skin: Denies  rash and wound.  Neurological: Denies dizziness,  syncope, weakness, light-headedness, numbness and headaches.  Hematological: Denies adenopathy.  Psychiatric/Behavioral: Denies confusion  Past Medical History  Diagnosis Date  . Essential hypertension   . Gout   . GERD (gastroesophageal reflux disease)   . Insomnia   . Atrial fibrillation   . Type 2 diabetes mellitus   . Allergic rhinitis   . Hyperlipemia, mixed   . Chronic renal disease, stage IV    Past Surgical History  Procedure Laterality Date  . Cesarean section      x2  . Esophageal dilation    . Permanent pacemaker insertion N/A 04/26/2014    Procedure: PERMANENT PACEMAKER INSERTION;  Surgeon: Evans Lance, MD;  Location: [facility] CATH LAB;  Service: Cardiovascular;  Laterality: N/A;    Family History: Reports diabetes on both maternal and paternal side. Mother died of heart disease  Social History:  reports that she has never smoked. She does not have any smokeless tobacco history on file. She reports that she does not drink alcohol or use illicit drugs.  Allergies  Allergen Reactions  . Cortisone Other (See Comments)    Hyperglycemia   . Prednisone Other (See Comments)    Hyperglycemia     Family History  Problem Relation Age of Onset  .  Coronary artery disease Father 58    Prior to Admission medications   Medication Sig Start Date End Date Taking? Authorizing Provider  allopurinol (ZYLOPRIM) 100 MG tablet Take 100 mg by mouth at bedtime.   Yes  Historical Provider, MD  atorvastatin (LIPITOR) 80 MG tablet Take 80 mg by mouth at bedtime.    Yes Historical Provider, MD  benazepril (LOTENSIN) 20 MG tablet Take 20 mg by mouth daily.   Yes Historical Provider, MD  diltiazem (CARDIZEM CD) 240 MG 24 hr capsule Take 1 capsule (240 mg total) by mouth daily. 04/27/14  Yes Rhonda G Barrett, PA-C  flecainide (TAMBOCOR) 100 MG tablet TAKE 1 TABLET BY MOUTH EVERY 12 HOURS 05/19/14  Yes Roderic Palau, NP  furosemide (LASIX) 40 MG tablet Take 40 mg by mouth daily.   Yes Historical Provider, MD  KLOR-CON M20 20 MEQ tablet Take 20 mEq by mouth daily. 04/09/14  Yes Historical Provider, MD  levothyroxine (SYNTHROID, LEVOTHROID) 50 MCG tablet Take 50 mcg by mouth daily. On a empty stomach.   Yes Historical Provider, MD  linagliptin (TRADJENTA) 5 MG TABS tablet Take 5 mg by mouth daily.   Yes Historical Provider, MD  warfarin (COUMADIN) 5 MG tablet Take as directed by coumadin clinic Patient taking differently: Take 2.5-5 mg by mouth daily. Take 1 tablet (5 mg) on (Mon, Wed) & Take 1/2 tablet (2.5 mg) on (Tues, Thurs, Fri, Sat & Sun). 12/22/13  Yes Jettie Booze, MD  repaglinide (PRANDIN) 0.5 MG tablet Take 2 tablets (1 mg total) by mouth 2 (two) times daily before a meal. 05/26/14   [name], MD     Physical Exam:  Filed Vitals:   05/26/14 0907  BP: 182/117  Pulse: 133  Temp: 98.1 F (36.7 C)  TempSrc: Oral  Resp: 19  SpO2: 96%    Constitutional: Vital signs reviewed.  Patient is an elderly female in no acute distress. HEENT: no pallor, no icterus, moist oral mucosa, no cervical lymphadenopathy, supple neck Cardiovascular: S1 and S2 is irregularly irregular no MRG Chest: CTAB, no wheezes, rales, or rhonchi gastrointestinal: Soft. Non-tender, non-distended, bowel sounds are normal, Musculoskeletal: warm, no edema Neurological: alert and oriented  Labs on Admission:  Basic Metabolic Panel:  Recent Labs Lab 05/25/14 0953  05/25/14 1701 05/26/14 0901  NA 128* 131* 132*  K 5.8* 5.3* 5.3*  CL 94* 97 98  CO2 26 23 19   GLUCOSE 586* 490* 427*  BUN 57* 57* 49*  CREATININE 2.20* 2.24* 2.08*  CALCIUM 9.6 9.9 9.7   Liver Function Tests:  Recent Labs Lab 05/25/14 1701  AST 19  ALT 26  ALKPHOS 123*  BILITOT 0.8  PROT 6.9  ALBUMIN 3.9   No results for input(s): LIPASE, AMYLASE in the last 168 hours. No results for input(s): AMMONIA in the last 168 hours. CBC:  Recent Labs Lab 05/25/14 0953 05/25/14 1701  WBC 15.1* 12.3*  NEUTROABS 12.8* 9.3*  HGB 15.8* 16.8*  HCT 47.2* 46.6*  MCV 95.0 92.6  PLT 265.0 234   Cardiac Enzymes: No results for input(s): CKTOTAL, CKMB, CKMBINDEX, TROPONINI in the last 168 hours. BNP: Invalid input(s): POCBNP CBG:  Recent Labs Lab 05/25/14 1736 05/25/14 1945 05/25/14 2303 05/26/14 0909 05/26/14 1007  GLUCAP 470* 324* 157* 396* 373*    Radiological Exams on Admission: No results found.  EKG: sinus tachycardia at 121, no ST-T changes, old LBBB   Assessment/Plan Principal Problem:   Uncontrolled diabetes mellitus Possibly related to recent steroid  use. No anon gap noted. Check UA. Continue prandin and tradjenda.  will place on levemir 30 units at bedtime with premeal aspart coverage 4 u tid.  Check repeat A1C.  Active Problems:   Paroxysmal atrial fibrillation Currently rate controlled on the monitor. Continue Coumadin (dosing per pharmacy) and flecainide. continue Cardizem. INR therapeutic. Cardiology will follow and decide on timing of cardioversion. Monitor on telemetry.       Acute renal failure superimposed on stage 3 chronic kidney disease Baseline creatinine around 1.7-1.8. Possibly related to mild dehydration. (Reports poor appetite for the last 2 days). Will hydrate her with IV normal saline. Avoid nephrotoxins.    Hyperkalemia No EKG changes. Hold AEEi. Should improve once hyperglycemia is better. Recheck in a.m.     Uncontrolled  hypertension Continue Cardizem. Hold Lasix and ACE inhibitor given worsening renal function. Will place on when necessary hydralazine.  Hyperlipidemia Continue Lipitor     GERD (gastroesophageal reflux disease) continue protonix    Hypothyroidism Continue Synthroid     Diet:cardiac/ diabetic  DVT prophylaxis: coumadin   Code Status: full code Family Communication: None at bedside Disposition Plan: monitor on tele. Likely cardioversion early next week.  Louellen Molder [facility] Pager [phone]  Total time spent on admission :70 minutes  If 7PM-7AM, please contact night-coverage [email or web address] Password [facility] 05/26/2014, 10:32 AM

## 2014-05-26 NOTE — Progress Notes (Signed)
Repeat labwork has returned - Potassium remains elevated at 5.3, sodium is 132, BG is 427, anion gap is 15. Creatinine is 2.08. She has been given 8U of regular insulin and 500 CC normal saline. Given ongoing uncontrolled hyperglycemia, I believe she is appropriate for a medicine admission. Will contact internal medicine to see if we can directly admit her for glycemic control.  [name], MD, [facility] Attending Cardiologist [place]

## 2014-05-26 NOTE — Discharge Instructions (Signed)

## 2014-05-26 NOTE — H&P (Signed)
     INTERVAL PROCEDURE H&P  History and Physical Interval Note:  05/26/2014 9:11 AM  Kathryn Richardson has presented today for their planned procedure. The various methods of treatment have been discussed with the patient and family. After consideration of risks, benefits and other options for treatment, the patient has consented to the procedure.  The patients' outpatient history has been reviewed, patient examined, and no change in status from most recent office note within the past 30 days. I have reviewed the patients' chart and labs and will proceed as planned. Questions were answered to the patient's satisfaction.   Kathryn Casino, MD, [facility] Attending Cardiologist [facility]  Kathryn Richardson 05/26/2014, 9:11 AM

## 2014-05-26 NOTE — Progress Notes (Signed)
ANTICOAGULATION CONSULT NOTE - Initial Consult  Pharmacy Consult for Warfarin  Indication: atrial fibrillation  Allergies  Allergen Reactions  . Cortisone Other (See Comments)    Hyperglycemia   . Prednisone Other (See Comments)    Hyperglycemia     Patient Measurements: Height: 5\' 3"  (160 cm) Weight: 159 lb 13.3 oz (72.5 kg) IBW/kg (Calculated) : 52.4  Vital Signs: Temp: 98.6 F (37 C) (01/15 1305) Temp Source: Oral (01/15 1305) BP: 157/84 mmHg (01/15 1305) Pulse Rate: 85 (01/15 1305)  Labs:  Recent Labs  05/25/14 0953 05/25/14 1701 05/26/14 0901  HGB 15.8* 16.8*  --   HCT 47.2* 46.6*  --   PLT 265.0 234  --   LABPROT  --  24.8*  --   INR  --  2.22*  --   CREATININE 2.20* 2.24* 2.08*    Estimated Creatinine Clearance: 21.6 mL/min (by C-G formula based on Cr of 2.08).   Medical History: Past Medical History  Diagnosis Date  . Essential hypertension   . Gout   . GERD (gastroesophageal reflux disease)   . Insomnia   . Atrial fibrillation   . Type 2 diabetes mellitus   . Allergic rhinitis   . Hyperlipemia, mixed   . Chronic renal disease, stage IV     Medications:  Prescriptions prior to admission  Medication Sig Dispense Refill Last Dose  . allopurinol (ZYLOPRIM) 100 MG tablet Take 100 mg by mouth at bedtime.   05/25/2014 at Unknown time  . atorvastatin (LIPITOR) 80 MG tablet Take 80 mg by mouth at bedtime.    05/25/2014 at Unknown time  . benazepril (LOTENSIN) 20 MG tablet Take 20 mg by mouth daily.   05/25/2014 at Unknown time  . diltiazem (CARDIZEM CD) 240 MG 24 hr capsule Take 1 capsule (240 mg total) by mouth daily. 30 capsule 11 05/25/2014 at Unknown time  . flecainide (TAMBOCOR) 100 MG tablet TAKE 1 TABLET BY MOUTH EVERY 12 HOURS 60 tablet 6 05/25/2014 at Unknown time  . furosemide (LASIX) 40 MG tablet Take 40 mg by mouth daily.   05/25/2014 at Unknown time  . KLOR-CON M20 20 MEQ tablet Take 20 mEq by mouth daily.  3 05/25/2014 at Unknown time  .  levothyroxine (SYNTHROID, LEVOTHROID) 50 MCG tablet Take 50 mcg by mouth daily. On a empty stomach.   05/25/2014 at Unknown time  . linagliptin (TRADJENTA) 5 MG TABS tablet Take 5 mg by mouth daily.   05/25/2014 at Unknown time  . warfarin (COUMADIN) 5 MG tablet Take as directed by coumadin clinic (Patient taking differently: Take 2.5-5 mg by mouth daily. Take 1 tablet (5 mg) on (Mon, Wed) & Take 1/2 tablet (2.5 mg) on (Tues, Thurs, Fri, Sat & Sun).) 30 tablet 3 05/25/2014 at Unknown time  . [DISCONTINUED] repaglinide (PRANDIN) 0.5 MG tablet Take 0.5 mg by mouth 2 (two) times daily before a meal.   05/25/2014 at Unknown time    Assessment: 68 YOF with Afib who presented for cardioversion. Pharmacy consulted to resume home Coumadin. INR on admission was therapeutic at 2.22. Last dose of Coumadin was yesterday.  Home Coumadin dose: 5 mg on Mon, Wed; 2.5 mg on all other days   Goal of Therapy:  INR 2-3 Monitor platelets by anticoagulation protocol: Yes   Plan:  -Resume home Coumadin dose  -Monitor daily PT/INR and s/s of bleeding   Albertina Parr, PharmD., BCPS Clinical Pharmacist Pager [phone]

## 2014-05-27 DIAGNOSIS — E782 Mixed hyperlipidemia: Secondary | ICD-10-CM | POA: Diagnosis not present

## 2014-05-27 DIAGNOSIS — E1165 Type 2 diabetes mellitus with hyperglycemia: Secondary | ICD-10-CM | POA: Diagnosis not present

## 2014-05-27 DIAGNOSIS — I10 Essential (primary) hypertension: Secondary | ICD-10-CM | POA: Diagnosis not present

## 2014-05-27 DIAGNOSIS — K219 Gastro-esophageal reflux disease without esophagitis: Secondary | ICD-10-CM | POA: Diagnosis not present

## 2014-05-27 DIAGNOSIS — I480 Paroxysmal atrial fibrillation: Secondary | ICD-10-CM | POA: Diagnosis not present

## 2014-05-27 DIAGNOSIS — E039 Hypothyroidism, unspecified: Secondary | ICD-10-CM | POA: Diagnosis not present

## 2014-05-27 DIAGNOSIS — N179 Acute kidney failure, unspecified: Secondary | ICD-10-CM | POA: Diagnosis not present

## 2014-05-27 DIAGNOSIS — N183 Chronic kidney disease, stage 3 (moderate): Secondary | ICD-10-CM | POA: Diagnosis not present

## 2014-05-27 DIAGNOSIS — E875 Hyperkalemia: Secondary | ICD-10-CM | POA: Diagnosis not present

## 2014-05-27 LAB — BASIC METABOLIC PANEL
Anion gap: 4 — ABNORMAL LOW (ref 5–15)
BUN: 43 mg/dL — ABNORMAL HIGH (ref 6–23)
CALCIUM: 9.1 mg/dL (ref 8.4–10.5)
CHLORIDE: 107 meq/L (ref 96–112)
CO2: 25 mmol/L (ref 19–32)
CREATININE: 1.87 mg/dL — AB (ref 0.50–1.10)
GFR calc non Af Amer: 25 mL/min — ABNORMAL LOW (ref >90)
GFR, EST AFRICAN AMERICAN: 29 mL/min — AB (ref >90)
Glucose, Bld: 81 mg/dL (ref 70–99)
Potassium: 4.3 mmol/L (ref 3.5–5.1)
SODIUM: 136 mmol/L (ref 135–145)

## 2014-05-27 LAB — CBC
HCT: 39.1 % (ref 36.0–46.0)
Hemoglobin: 13.7 g/dL (ref 12.0–15.0)
MCH: 32.5 pg (ref 26.0–34.0)
MCHC: 35 g/dL (ref 30.0–36.0)
MCV: 92.7 fL (ref 78.0–100.0)
Platelets: 199 10*3/uL (ref 150–400)
RBC: 4.22 MIL/uL (ref 3.87–5.11)
RDW: 13.9 % (ref 11.5–15.5)
WBC: 9.3 10*3/uL (ref 4.0–10.5)

## 2014-05-27 LAB — PROTIME-INR
INR: 2.83 — AB (ref 0.00–1.49)
Prothrombin Time: 30 seconds — ABNORMAL HIGH (ref 11.6–15.2)

## 2014-05-27 LAB — GLUCOSE, CAPILLARY
Glucose-Capillary: 93 mg/dL (ref 70–99)
Glucose-Capillary: 139 mg/dL — ABNORMAL HIGH (ref 70–99)
Glucose-Capillary: 120 mg/dL — ABNORMAL HIGH (ref 70–99)
Glucose-Capillary: 78 mg/dL (ref 70–99)

## 2014-05-27 LAB — HEMOGLOBIN A1C
Hgb A1c MFr Bld: 10.3 % — ABNORMAL HIGH (ref <5.7)
Mean Plasma Glucose: 249 mg/dL — ABNORMAL HIGH (ref <117)

## 2014-05-27 MED ORDER — REPAGLINIDE 0.5 MG PO TABS
0.5000 mg | ORAL_TABLET | Freq: Two times a day (BID) | ORAL | Status: DC
  Filled 2014-05-27 (×2): qty 1

## 2014-05-27 MED ORDER — REPAGLINIDE 0.5 MG PO TABS
0.5000 mg | ORAL_TABLET | Freq: Two times a day (BID) | ORAL | Status: DC
  Administered 2014-05-28 – 2014-05-29 (×3): 0.5 mg via ORAL
  Filled 2014-05-27 (×5): qty 1

## 2014-05-27 NOTE — Progress Notes (Signed)
ANTICOAGULATION CONSULT NOTE - Followup Consult  Pharmacy Consult for Warfarin  Indication: atrial fibrillation  Allergies  Allergen Reactions  . Cortisone Other (See Comments)    Hyperglycemia   . Prednisone Other (See Comments)    Hyperglycemia     Patient Measurements: Height: 5\' 3"  (160 cm) Weight: 160 lb 7.9 oz (72.8 kg) IBW/kg (Calculated) : 52.4  Vital Signs: Temp: 97.8 F (36.6 C) (01/16 1015) Temp Source: Oral (01/16 1015) BP: 146/85 mmHg (01/16 1015) Pulse Rate: 94 (01/16 1015)  Labs:  Recent Labs  05/25/14 0953 05/25/14 1701 05/26/14 0901 05/27/14 0523  HGB 15.8* 16.8*  --  13.7  HCT 47.2* 46.6*  --  39.1  PLT 265.0 234  --  199  LABPROT  --  24.8*  --  30.0*  INR  --  2.22*  --  2.83*  CREATININE 2.20* 2.24* 2.08* 1.87*    Estimated Creatinine Clearance: 24.1 mL/min (by C-G formula based on Cr of 1.87).   Medical History: Past Medical History  Diagnosis Date  . Essential hypertension   . Gout   . GERD (gastroesophageal reflux disease)   . Insomnia   . Atrial fibrillation   . Type 2 diabetes mellitus   . Allergic rhinitis   . Hyperlipemia, mixed   . Chronic renal disease, stage IV     Medications:  Prescriptions prior to admission  Medication Sig Dispense Refill Last Dose  . allopurinol (ZYLOPRIM) 100 MG tablet Take 100 mg by mouth at bedtime.   05/25/2014 at Unknown time  . atorvastatin (LIPITOR) 80 MG tablet Take 80 mg by mouth at bedtime.    05/25/2014 at Unknown time  . benazepril (LOTENSIN) 20 MG tablet Take 20 mg by mouth daily.   05/25/2014 at Unknown time  . diltiazem (CARDIZEM CD) 240 MG 24 hr capsule Take 1 capsule (240 mg total) by mouth daily. 30 capsule 11 05/25/2014 at Unknown time  . flecainide (TAMBOCOR) 100 MG tablet TAKE 1 TABLET BY MOUTH EVERY 12 HOURS 60 tablet 6 05/25/2014 at Unknown time  . furosemide (LASIX) 40 MG tablet Take 40 mg by mouth daily.   05/25/2014 at Unknown time  . KLOR-CON M20 20 MEQ tablet Take 20 mEq by  mouth daily.  3 05/25/2014 at Unknown time  . levothyroxine (SYNTHROID, LEVOTHROID) 50 MCG tablet Take 50 mcg by mouth daily. On a empty stomach.   05/25/2014 at Unknown time  . linagliptin (TRADJENTA) 5 MG TABS tablet Take 5 mg by mouth daily.   05/25/2014 at Unknown time  . repaglinide (PRANDIN) 0.5 MG tablet Take 0.5 mg by mouth 2 (two) times daily before a meal.   Past Week at Unknown time  . warfarin (COUMADIN) 5 MG tablet Take as directed by coumadin clinic (Patient taking differently: Take 2.5-5 mg by mouth daily at 6 PM. Take 1 tablet (5 mg) on (Mon, Wed) & Take 1/2 tablet (2.5 mg) on (Tues, Thurs, Fri, Sat & Sun).) 30 tablet 3 05/25/2014 at Unknown time  . [DISCONTINUED] repaglinide (PRANDIN) 0.5 MG tablet Take 0.5 mg by mouth 2 (two) times daily before a meal.       Assessment: 85 YOF with Afib who presented for cardioversion. Pharmacy consulted to resume home Coumadin. INR on admission was therapeutic at 2.22. INR today therapeutic 2.83, will continue with home dose.  Home Coumadin dose: 5 mg on Mon, Wed; 2.5 mg on all other days.  Goal of Therapy:  INR 2-3 Monitor platelets by anticoagulation protocol: Yes  Plan:  -Warfarin 2.5mg  x1 -Monitor daily PT/INR and s/s of bleeding   Zanden Colver E. Malvika Tung, Pharm.D Clinical Pharmacy Resident Pager: [phone] 05/27/2014 11:02 AM

## 2014-05-27 NOTE — Progress Notes (Signed)
[facility]                                                                              Patient Demographics  Kathryn Richardson, is a 80 y.o. female, DOB - [birth year], ZJ:[id number]  Admit date - 05/26/2014   Admitting Physician Louellen Molder, MD  Outpatient Primary MD for the patient is Gara Kroner, MD  LOS - 1   No chief complaint on file.     HPI on 05/26/2014 by Dr. Flonnie Overman Dhungel 80 year old female wit history of type 2 diabetes mellitus, paroxysmal A. fib with hospitalization in November to cardiology service status dual chamber pacemaker placement, now on coumadin, chronic kidney disease stage III ( baseline creatinine around 1.7, follows with nephrologist at [facility]) , hypertension, GERD, hypothyroidism presented for cardioversion today. During preprocedure evaluation yesterday she was found to hypoglycemic with blood sugar in 500s. She was sent to the ED where she was given some insulin and once the blood glucose was controlled and she was sent home. Patient reports getting a cortisone injection in her knee on 1/09. Denies polyuria or polydipsia. She denies checking her blood glucose at home for the last few days. Patient denies headache, dizziness, fever, chills, nausea , vomiting, chest pain, palpitations, SOB, abdominal pain, bowel or urinary symptoms. Denies change in weight or appetite. In the endoscopy area she was found to be hypertensive systolic blood pressure in 180s. (Patient had not taken her blood pressure medications today). Blood work done showed blood glucose again in 400s with normal anion gap and potassium of 5.3. Cardioversion was aborted and hospitalist admission requested for patient monitoring and blood sugar control. Patient is on repaglinide and Tradjenta at home and her last A1c was 7.6 in November 2015.  On arrival to the floor her blood pressure was 157/84, afebrile , HR of 85 and O2 sat of 96% on room air  Assessment & Plan   Hyperglycemia/  Diabetes Mellitus, Type 2 -Patient was admitted noted to have hyperglycemia -Last HbA1c in Nov 2015 7.6 -Continue home mediations tradjenta and Repaglinide -Continue ISS, CBG monitoring, levemir -Will consult DM coordinator  Paroxysmal Atrial fibrillation -Cardiology consulted and appreciated -Patient was to have cardioversion yesterday 05/26/2014, however became unstable -Continue coumadin with pharmacy to follow -Continue flecainide and cardizem  Acute on chronic kidney disease, stage III -Upon admission 2.24, currently 1.87 -Baseline creatinine 1.7-1.8 -Possibly secondary to dehydration and poor oral intake  Hyperkalemia -Resolved -ACEI held -No EKG changes  Hypertension -Continue cardizem -Lasix and benazepril  GERD -Continue PPI  Hypothyroidism -Continue synthroid  Hyperlipidemia -Continue statin  Code Status: Full  Family Communication: None at bedside  Disposition Plan: Admitted  Time Spent in minutes   30 minutes  Procedures  None  Consults   Cardiology  DVT Prophylaxis  Coumadin  Lab Results  Component Value Date   PLT 199 05/27/2014    Medications  Scheduled Meds: . allopurinol  100 mg Oral QHS  . atorvastatin  80 mg Oral QHS  . flecainide  50 mg Oral Q12H  . insulin aspart  0-20 Units Subcutaneous TID WC  . insulin aspart  0-5 Units Subcutaneous QHS  . insulin aspart  4  Units Subcutaneous TID WC  . insulin detemir  30 Units Subcutaneous QHS  . levothyroxine  50 mcg Oral QAC breakfast  . linagliptin  5 mg Oral Daily  . sodium chloride  3 mL Intravenous Q12H  . [START ON 05/29/2014] warfarin  5 mg Oral Once per day on Mon Wed  . Warfarin - Pharmacist Dosing Inpatient   Does not apply q1800   Continuous Infusions:  PRN Meds:.acetaminophen **OR** acetaminophen, hydrALAZINE, ondansetron **OR** ondansetron (ZOFRAN) IV  Antibiotics    Anti-infectives    None        Subjective:   Kathryn Richardson seen and examined today. Patient  continues to complain of palpitations. Denies chest pain, SOB, abdominal pain, dizziness.   Objective:   Filed Vitals:   05/26/14 1800 05/26/14 2118 05/27/14 0417 05/27/14 1015  BP: 148/80 135/63 130/82 146/85  Pulse: 82 78 448 94  Temp: 98.4 F (36.9 C) 98.6 F (37 C) 97.7 F (36.5 C) 97.8 F (36.6 C)  TempSrc: Oral Oral Oral Oral  Resp: 20 16 18 17   Height:      Weight:  72.8 kg (160 lb 7.9 oz)    SpO2: 96% 97% 97% 97%    Wt Readings from Last 3 Encounters:  05/26/14 72.8 kg (160 lb 7.9 oz)  05/19/14 73.846 kg (162 lb 12.8 oz)  04/27/14 78.019 kg (172 lb)     Intake/Output Summary (Last 24 hours) at 05/27/14 1209 Last data filed at 05/27/14 0900  Gross per 24 hour  Intake    480 ml  Output      0 ml  Net    480 ml    Exam  General: Well developed, well nourished, NAD, appears stated age  HEENT: NCAT, mucous membranes moist.   Cardiovascular: S1 S2 auscultated, irregularly irregular  Respiratory: Clear to auscultation bilaterally with equal chest rise  Abdomen: Soft, nontender, nondistended, + bowel sounds  Extremities: warm dry without cyanosis clubbing or edema  Neuro: AAOx3, nonfocal  Skin: Without rashes exudates or nodules  Psych: Normal affect and demeanor with intact judgement and insight  Data Review   Micro Results No results found for this or any previous visit (from the past 240 hour(s)).  Radiology Reports Dg Chest 2 View  05/16/2014   CLINICAL DATA:  One week of cough and congestion without fever; history of pacemaker placement in December 2015.  EXAM: CHEST  2 VIEW  COMPARISON:  PA and lateral chest of April 27, 2014 and September 09, 2010.  FINDINGS: The lungs are adequately inflated and clear. The heart and pulmonary vascularity appear normal. The permanent pacemaker is in appropriate position radiographically. There is no pleural effusion or pneumothorax. There are tiny calcified nodules in the left lung measuring up to 3 mm in diameter  which are stable since April 2012 and are compatible with previous granulomatous infection.  IMPRESSION: No active cardiopulmonary disease.   Electronically Signed   By: David  Martinique   On: 05/16/2014 11:29    CBC  Recent Labs Lab 05/25/14 0953 05/25/14 1701 05/27/14 0523  WBC 15.1* 12.3* 9.3  HGB 15.8* 16.8* 13.7  HCT 47.2* 46.6* 39.1  PLT 265.0 234 199  MCV 95.0 92.6 92.7  MCH  --  33.4 32.5  MCHC 33.4 36.1* 35.0  RDW 13.9 13.6 13.9  LYMPHSABS 1.1 1.7  --   MONOABS 1.1* 1.3*  --   EOSABS 0.0 0.1  --   BASOSABS 0.0 0.0  --     Chemistries  Recent Labs Lab 05/25/14 0953 05/25/14 1701 05/26/14 0901 05/27/14 0523  NA 128* 131* 132* 136  K 5.8* 5.3* 5.3* 4.3  CL 94* 97 98 107  CO2 26 23 19 25   GLUCOSE 586* 490* 427* 81  BUN 57* 57* 49* 43*  CREATININE 2.20* 2.24* 2.08* 1.87*  CALCIUM 9.6 9.9 9.7 9.1  AST  --  19  --   --   ALT  --  26  --   --   ALKPHOS  --  123*  --   --   BILITOT  --  0.8  --   --    ------------------------------------------------------------------------------------------------------------------ estimated creatinine clearance is 24.1 mL/min (by C-G formula based on Cr of 1.87). ------------------------------------------------------------------------------------------------------------------  Recent Labs  05/26/14 1100  HGBA1C 10.3*   ------------------------------------------------------------------------------------------------------------------ No results for input(s): CHOL, HDL, LDLCALC, TRIG, CHOLHDL, LDLDIRECT in the last 72 hours. ------------------------------------------------------------------------------------------------------------------ No results for input(s): TSH, T4TOTAL, T3FREE, THYROIDAB in the last 72 hours.  Invalid input(s): FREET3 ------------------------------------------------------------------------------------------------------------------ No results for input(s): VITAMINB12, FOLATE, FERRITIN, TIBC, IRON,  RETICCTPCT in the last 72 hours.  Coagulation profile  Recent Labs Lab 05/25/14 1701 05/27/14 0523  INR 2.22* 2.83*    No results for input(s): DDIMER in the last 72 hours.  Cardiac Enzymes No results for input(s): CKMB, TROPONINI, MYOGLOBIN in the last 168 hours.  Invalid input(s): CK ------------------------------------------------------------------------------------------------------------------ Invalid input(s): POCBNP    Tyana Butzer D.O. on 05/27/2014 at 12:09 PM  Between 7am to 7pm - Pager - [phone]  After 7pm go to [email or web address] - password [email or web address]  And look for the night coverage person covering for me after hours  [facility] Office  [phone]

## 2014-05-28 DIAGNOSIS — I10 Essential (primary) hypertension: Secondary | ICD-10-CM | POA: Diagnosis not present

## 2014-05-28 DIAGNOSIS — E782 Mixed hyperlipidemia: Secondary | ICD-10-CM | POA: Diagnosis not present

## 2014-05-28 DIAGNOSIS — N183 Chronic kidney disease, stage 3 (moderate): Secondary | ICD-10-CM | POA: Diagnosis not present

## 2014-05-28 DIAGNOSIS — E875 Hyperkalemia: Secondary | ICD-10-CM | POA: Diagnosis not present

## 2014-05-28 DIAGNOSIS — I480 Paroxysmal atrial fibrillation: Secondary | ICD-10-CM | POA: Diagnosis not present

## 2014-05-28 DIAGNOSIS — N179 Acute kidney failure, unspecified: Secondary | ICD-10-CM | POA: Diagnosis not present

## 2014-05-28 DIAGNOSIS — E1165 Type 2 diabetes mellitus with hyperglycemia: Principal | ICD-10-CM

## 2014-05-28 DIAGNOSIS — E039 Hypothyroidism, unspecified: Secondary | ICD-10-CM | POA: Diagnosis not present

## 2014-05-28 LAB — BASIC METABOLIC PANEL
Anion gap: 6 (ref 5–15)
BUN: 37 mg/dL — ABNORMAL HIGH (ref 6–23)
CO2: 26 mmol/L (ref 19–32)
CREATININE: 1.71 mg/dL — AB (ref 0.50–1.10)
Calcium: 8.9 mg/dL (ref 8.4–10.5)
Chloride: 105 mEq/L (ref 96–112)
GFR, EST AFRICAN AMERICAN: 32 mL/min — AB (ref >90)
GFR, EST NON AFRICAN AMERICAN: 28 mL/min — AB (ref >90)
Glucose, Bld: 132 mg/dL — ABNORMAL HIGH (ref 70–99)
POTASSIUM: 3.8 mmol/L (ref 3.5–5.1)
Sodium: 137 mmol/L (ref 135–145)

## 2014-05-28 LAB — PROTIME-INR
INR: 1.9 — AB (ref 0.00–1.49)
Prothrombin Time: 22 seconds — ABNORMAL HIGH (ref 11.6–15.2)

## 2014-05-28 LAB — CBC
HEMATOCRIT: 40.4 % (ref 36.0–46.0)
Hemoglobin: 13.9 g/dL (ref 12.0–15.0)
MCH: 31.9 pg (ref 26.0–34.0)
MCHC: 34.4 g/dL (ref 30.0–36.0)
MCV: 92.7 fL (ref 78.0–100.0)
Platelets: 173 10*3/uL (ref 150–400)
RBC: 4.36 MIL/uL (ref 3.87–5.11)
RDW: 14 % (ref 11.5–15.5)
WBC: 9.7 10*3/uL (ref 4.0–10.5)

## 2014-05-28 LAB — GLUCOSE, CAPILLARY
GLUCOSE-CAPILLARY: 90 mg/dL (ref 70–99)
GLUCOSE-CAPILLARY: 140 mg/dL — AB (ref 70–99)
GLUCOSE-CAPILLARY: 163 mg/dL — AB (ref 70–99)
Glucose-Capillary: 160 mg/dL — ABNORMAL HIGH (ref 70–99)

## 2014-05-28 MED ORDER — ENOXAPARIN SODIUM 80 MG/0.8ML ~~LOC~~ SOLN
75.0000 mg | SUBCUTANEOUS | Status: DC
  Administered 2014-05-28 – 2014-05-29 (×2): 75 mg via SUBCUTANEOUS
  Filled 2014-05-28 (×2): qty 0.8

## 2014-05-28 MED ORDER — SODIUM CHLORIDE 0.9 % IJ SOLN
3.0000 mL | Freq: Two times a day (BID) | INTRAMUSCULAR | Status: DC
  Administered 2014-05-28: 3 mL via INTRAVENOUS

## 2014-05-28 MED ORDER — WARFARIN SODIUM 5 MG PO TABS
5.0000 mg | ORAL_TABLET | Freq: Once | ORAL | Status: AC
  Administered 2014-05-28: 5 mg via ORAL
  Filled 2014-05-28: qty 1

## 2014-05-28 MED ORDER — SODIUM CHLORIDE 0.9 % IJ SOLN: 3.0000 mL | INTRAMUSCULAR | Status: DC | PRN

## 2014-05-28 MED ORDER — SODIUM CHLORIDE 0.9 % IV SOLN: 250.0000 mL | INTRAVENOUS | Status: DC

## 2014-05-28 MED ORDER — LIVING WELL WITH DIABETES BOOK
Freq: Once | Status: AC
  Administered 2014-05-28: 12:00:00
  Filled 2014-05-28: qty 1

## 2014-05-28 MED ORDER — INSULIN STARTER KIT- PEN NEEDLES (ENGLISH)
1.0000 | Freq: Once | Status: AC
  Administered 2014-05-28: 1
  Filled 2014-05-28: qty 1

## 2014-05-28 NOTE — Progress Notes (Signed)
05/28/2014 5:47 PM  Pt drew up and self administered her insulin without instruction (I did double check the dosage and observe administration) and did so correctly.  She verbalized what she was doing as she was doing it as well.  Pt seems to have a good grasp on how to draw up and adminster SQ insulin.  We have not reviewed insulin pens yet.   Kathryn Richardson

## 2014-05-28 NOTE — Progress Notes (Signed)
Admit date: 05/26/2014 Referring Physician  Dr. Clementeen Graham Primary Physician Gara Kroner, MD Primary Cardiologist  Dr. Debara Pickett Reason for Consultation  Afib ? cardioversion  HPI: 80 year old with paroxysmal atrial fibrillation, dual-chamber pacemaker, chronic anticoagulation with Coumadin, chronic kidney disease with baseline creatinine of 1.7, stage III, hypertension, diabetes who on 05/26/14 presented for cardioversion and while in endoscopy she was found to have blood pressures in the [id number] systolic and blood glucose in 400s with anion gap and potassium of 5.3. Cardioversion was aborted.  Hyperglycemia has resolved.  Question now is she qualified for cardioversion currently.  On flecainide, Coumadin.    PMH:   Past Medical History  Diagnosis Date  . Essential hypertension   . Gout   . GERD (gastroesophageal reflux disease)   . Insomnia   . Atrial fibrillation   . Type 2 diabetes mellitus   . Allergic rhinitis   . Hyperlipemia, mixed   . Chronic renal disease, stage IV     PSH:   Past Surgical History  Procedure Laterality Date  . Cesarean section      x2  . Esophageal dilation    . Permanent pacemaker insertion N/A 04/26/2014    Procedure: PERMANENT PACEMAKER INSERTION;  Surgeon: Evans Lance, MD;  Location: [facility] CATH LAB;  Service: Cardiovascular;  Laterality: N/A;   Allergies:  Cortisone and Prednisone Prior to Admit Meds:   Prior to Admission medications   Medication Sig Start Date End Date Taking? Authorizing Provider  allopurinol (ZYLOPRIM) 100 MG tablet Take 100 mg by mouth at bedtime.   Yes Historical Provider, MD  atorvastatin (LIPITOR) 80 MG tablet Take 80 mg by mouth at bedtime.    Yes Historical Provider, MD  benazepril (LOTENSIN) 20 MG tablet Take 20 mg by mouth daily.   Yes Historical Provider, MD  diltiazem (CARDIZEM CD) 240 MG 24 hr capsule Take 1 capsule (240 mg total) by mouth daily. 04/27/14  Yes Rhonda G Barrett, PA-C  flecainide (TAMBOCOR) 100 MG  tablet TAKE 1 TABLET BY MOUTH EVERY 12 HOURS 05/19/14  Yes Roderic Palau, NP  furosemide (LASIX) 40 MG tablet Take 40 mg by mouth daily.   Yes Historical Provider, MD  KLOR-CON M20 20 MEQ tablet Take 20 mEq by mouth daily. 04/09/14  Yes Historical Provider, MD  levothyroxine (SYNTHROID, LEVOTHROID) 50 MCG tablet Take 50 mcg by mouth daily. On a empty stomach.   Yes Historical Provider, MD  linagliptin (TRADJENTA) 5 MG TABS tablet Take 5 mg by mouth daily.   Yes Historical Provider, MD  repaglinide (PRANDIN) 0.5 MG tablet Take 0.5 mg by mouth 2 (two) times daily before a meal.   Yes Historical Provider, MD  warfarin (COUMADIN) 5 MG tablet Take as directed by coumadin clinic Patient taking differently: Take 2.5-5 mg by mouth daily at 6 PM. Take 1 tablet (5 mg) on (Mon, Wed) & Take 1/2 tablet (2.5 mg) on (Tues, Thurs, Fri, Sat & Sun). 12/22/13  Yes Jettie Booze, MD  repaglinide (PRANDIN) 0.5 MG tablet Take 2 tablets (1 mg total) by mouth 2 (two) times daily before a meal. 05/26/14   [name], MD   Fam HX:    Family History  Problem Relation Age of Onset  . Coronary artery disease Father 37   Social HX:    History   Social History  . Marital Status: Married    Spouse Name: N/A    Number of Children: N/A  . Years of Education: N/A  Occupational History  . Not on file.   Social History Main Topics  . Smoking status: Never Smoker   . Smokeless tobacco: Not on file  . Alcohol Use: No  . Drug Use: No  . Sexual Activity: Not on file   Other Topics Concern  . Not on file   Social History Narrative   Lives in [place].  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 rate 18, height 5\' 3"  (1.6 m),  weight 164 lb 7.4 oz (74.6 kg), SpO2 97 %.   General: Well developed, well nourished, in no acute distress Head: Eyes PERRLA, No xanthomas.   Normal cephalic and atramatic  Lungs:   Clear bilaterally to auscultation and percussion. Normal respiratory effort. No wheezes, no rales. Heart:   Irregularly irregular tachy S1 S2 Pulses are 2+ & equal. No murmur, rubs, gallops.  No carotid bruit. No JVD.  No abdominal bruits.  Abdomen: Bowel sounds are positive, abdomen soft and non-tender without masses. No hepatosplenomegaly. Msk:  Back normal. Normal strength and tone for age. Extremities:  No clubbing, cyanosis or edema.  DP +1 Neuro: Alert and oriented X 3, non-focal, MAE x 4 GU: Deferred Rectal: Deferred Psych:  Good affect, responds appropriately      Labs: Lab Results  Component Value Date   WBC 9.7 05/28/2014   HGB 13.9 05/28/2014   HCT 40.4 05/28/2014   MCV 92.7 05/28/2014   PLT 173 05/28/2014     Recent Labs Lab 05/25/14 1701  05/28/14 0540  NA 131*  < > 137  K 5.3*  < > 3.8  CL 97  < > 105  CO2 23  < > 26  BUN 57*  < > 37*  CREATININE 2.24*  < > 1.71*  CALCIUM 9.9  < > 8.9  PROT 6.9  --   --   BILITOT 0.8  --   --   ALKPHOS 123*  --   --   ALT 26  --   --   AST 19  --   --   GLUCOSE 490*  < > 132*  < > = values in this interval not displayed. No results for input(s): CKTOTAL, CKMB, TROPONINI in the last 72 hours. No results found for: CHOL, HDL, LDLCALC, TRIG Lab Results  Component Value Date   DDIMER <0.27 04/25/2014   EKG:  05/25/14-atrial fibrillation heart rate 121 with left bundle branch block like morphology Personally viewed.   ASSESSMENT/PLAN:    80 year old female with paroxysmal atrial fibrillation on flecainide, Coumadin with canceled cardioversion on 05/26/14 secondary to severe hyperglycemia and hypertension.  1. Paroxysmal atrial fibrillation  - She may proceed with cardioversion, but unfortunately her INR now is 1.9. I will place her on  add-on schedule for tomorrow, but she will need transesophageal echocardiogram prior to her cardioversion technically since she is subtherapeutic. I have discussed this with her. Another option would be to have her return after 3 weeks of optimal INR, greater than 2 but her heart rate is in the 130 range with activity. She would like to go ahead and get this taken care of. I agree. Nothing by mouth past midnight.  2. Chronic anticoagulation  - Currently  INR is 1.9.   - Previous INR was 2.8.  - I will ask pharmacy to dose Lovenox for atrial fibrillation to ensure that she is fully anticoagulated prior to cardioversion tomorrow.  - Repeat INR in a.m.  3. Diabetes with hyperglycemia-now controlled. Appreciate hospitalist service.   We'll know more tomorrow about TEE cardioversion timing.  Candee Furbish, MD  05/28/2014  2:31 PM

## 2014-05-28 NOTE — Plan of Care (Signed)
Problem: Consults Goal: Diagnosis-Diabetes Mellitus New Onset Type II , new to insulin

## 2014-05-28 NOTE — Progress Notes (Addendum)
ANTICOAGULATION CONSULT NOTE - Followup Consult  Pharmacy Consult for Warfarin  Indication: atrial fibrillation  Allergies  Allergen Reactions  . Cortisone Other (See Comments)    Hyperglycemia   . Prednisone Other (See Comments)    Hyperglycemia     Patient Measurements: Height: 5\' 3"  (160 cm) Weight: 164 lb 7.4 oz (74.6 kg) IBW/kg (Calculated) : 52.4  Vital Signs: Temp: 97.9 F (36.6 C) (01/17 0445) Temp Source: Oral (01/17 0445) BP: 137/86 mmHg (01/17 0445) Pulse Rate: 86 (01/17 0445)  Labs:  Recent Labs  05/25/14 1701 05/26/14 0901 05/27/14 0523 05/28/14 0540  HGB 16.8*  --  13.7 13.9  HCT 46.6*  --  39.1 40.4  PLT 234  --  199 173  LABPROT 24.8*  --  30.0* 22.0*  INR 2.22*  --  2.83* 1.90*  CREATININE 2.24* 2.08* 1.87* 1.71*    Estimated Creatinine Clearance: 26.7 mL/min (by C-G formula based on Cr of 1.71).   Medical History: Past Medical History  Diagnosis Date  . Essential hypertension   . Gout   . GERD (gastroesophageal reflux disease)   . Insomnia   . Atrial fibrillation   . Type 2 diabetes mellitus   . Allergic rhinitis   . Hyperlipemia, mixed   . Chronic renal disease, stage IV     Medications:  Prescriptions prior to admission  Medication Sig Dispense Refill Last Dose  . allopurinol (ZYLOPRIM) 100 MG tablet Take 100 mg by mouth at bedtime.   05/25/2014 at Unknown time  . atorvastatin (LIPITOR) 80 MG tablet Take 80 mg by mouth at bedtime.    05/25/2014 at Unknown time  . benazepril (LOTENSIN) 20 MG tablet Take 20 mg by mouth daily.   05/25/2014 at Unknown time  . diltiazem (CARDIZEM CD) 240 MG 24 hr capsule Take 1 capsule (240 mg total) by mouth daily. 30 capsule 11 05/25/2014 at Unknown time  . flecainide (TAMBOCOR) 100 MG tablet TAKE 1 TABLET BY MOUTH EVERY 12 HOURS 60 tablet 6 05/25/2014 at Unknown time  . furosemide (LASIX) 40 MG tablet Take 40 mg by mouth daily.   05/25/2014 at Unknown time  . KLOR-CON M20 20 MEQ tablet Take 20 mEq by  mouth daily.  3 05/25/2014 at Unknown time  . levothyroxine (SYNTHROID, LEVOTHROID) 50 MCG tablet Take 50 mcg by mouth daily. On a empty stomach.   05/25/2014 at Unknown time  . linagliptin (TRADJENTA) 5 MG TABS tablet Take 5 mg by mouth daily.   05/25/2014 at Unknown time  . repaglinide (PRANDIN) 0.5 MG tablet Take 0.5 mg by mouth 2 (two) times daily before a meal.   Past Week at Unknown time  . warfarin (COUMADIN) 5 MG tablet Take as directed by coumadin clinic (Patient taking differently: Take 2.5-5 mg by mouth daily at 6 PM. Take 1 tablet (5 mg) on (Mon, Wed) & Take 1/2 tablet (2.5 mg) on (Tues, Thurs, Fri, Sat & Sun).) 30 tablet 3 05/25/2014 at Unknown time  . [DISCONTINUED] repaglinide (PRANDIN) 0.5 MG tablet Take 0.5 mg by mouth 2 (two) times daily before a meal.       Assessment: 73 YOF with Afib who presented for cardioversion. Pharmacy consulted to resume home Coumadin. INR on admission was therapeutic at 2.22>>2.83 yesterday. INR today SUBtherapeutic at 1.9 due to missed dose last night. Will increase dose today above home dose to bring pt's INR back up. Hgb and plt stable.  Home Coumadin dose: 5 mg on Mon, Wed; 2.5 mg on all  other days.  Goal of Therapy:  INR 2-3 Monitor platelets by anticoagulation protocol: Yes   Plan:  -Warfarin 5mg  x1 -Monitor daily PT/INR and s/s of bleeding   Megan E. Supple, Pharm.D Clinical Pharmacy Resident Pager: [phone] 05/28/2014 9:29 AM    Pharmacy consult for treatment dose Lovenox while INR subtherapeutic today. Plan for cardioversion hopefully tomorrow. Will dose adjust for CrCl <30.  Plan: -Lovenox 75mg  q24h  Megan E. Supple, Pharm.D Clinical Pharmacy Resident Pager: [phone] 05/28/2014 3:02 PM

## 2014-05-28 NOTE — Progress Notes (Signed)
05/28/2014 11:58 AM  Pt has watched the following videos pertaining to diabetic education:  [phone]  I have also given the patient her diabetes education booklet and encouraged her to read through it at her leisure and report any questions she may have, to which she verbalized understanding.  Pt also was instructed on how to draw up and self administer insulin injection, which she did we return demonstration and did great with.  Will continue to practice with the patient. [name]

## 2014-05-28 NOTE — Progress Notes (Signed)
05/28/2014 10:48 AM  Pt given the video list pertaining to diabetic education videos.  I highlighted the necessary videos to watch, and instructed the patient to check them off as she watches them and that I would check in with her throughout the day to track her progress.  Pt verbalized understanding, and seems really eager to learn.  Pt states that she already checks her blood sugars at home without difficulty and feels comfortable doing this, however, she is somewhat apprehensive about administering her own insulin.  She states she is eager to learn though.  I informed the patient that I would be letting her draw up her own insulin and self administer it as scheduled.  She verbalized understanding.  Will continue to monitor patient. Kathryn Richardson

## 2014-05-28 NOTE — Progress Notes (Signed)
[facility]                                                                              Patient Demographics  Kathryn Richardson, is a 80 y.o. female, DOB - 03/17/1935, VFI:[id number]  Admit date - 05/26/2014   Admitting Physician Louellen Molder, MD  Outpatient Primary MD for the patient is Gara Kroner, MD  LOS - 2   No chief complaint on file.     HPI on 05/26/2014 by Dr. Flonnie Overman Dhungel 80 year old female wit history of type 2 diabetes mellitus, paroxysmal A. fib with hospitalization in November to cardiology service status dual chamber pacemaker placement, now on coumadin, chronic kidney disease stage III ( baseline creatinine around 1.7, follows with nephrologist at [facility]) , hypertension, GERD, hypothyroidism presented for cardioversion today. During preprocedure evaluation yesterday she was found to hypoglycemic with blood sugar in 500s. She was sent to the ED where she was given some insulin and once the blood glucose was controlled and she was sent home. Patient reports getting a cortisone injection in her knee on 1/09. Denies polyuria or polydipsia. She denies checking her blood glucose at home for the last few days. Patient denies headache, dizziness, fever, chills, nausea , vomiting, chest pain, palpitations, SOB, abdominal pain, bowel or urinary symptoms. Denies change in weight or appetite. In the endoscopy area she was found to be hypertensive systolic blood pressure in 180s. (Patient had not taken her blood pressure medications today). Blood work done showed blood glucose again in 400s with normal anion gap and potassium of 5.3. Cardioversion was aborted and hospitalist admission requested for patient monitoring and blood sugar control. Patient is on repaglinide and Tradjenta at home and her last A1c was 7.6 in November 2015.  On arrival to the floor her blood pressure was 157/84, afebrile , HR of 85 and O2 sat of 96% on room air  Assessment & Plan   Hyperglycemia/  Diabetes Mellitus, Type 2 -Patient was admitted noted to have hyperglycemia, which has resolved -Last HbA1c in Nov 2015 7.6; Jan 2016 10.3? -Continue home mediations tradjenta and Repaglinide -Continue ISS, CBG monitoring, levemir -DM coordinator consulted  Paroxysmal Atrial fibrillation -Patient was to have cardioversion yesterday 05/26/2014, however became unstable -Continue coumadin with pharmacy to follow -Continue flecainide and cardizem -Cardiology consulted and appreciated, possible cardioversion 05/30/2014  Acute on chronic kidney disease, stage III -Upon admission 2.24, currently 1.71 -Baseline creatinine 1.7-1.8 -Possibly secondary to dehydration and poor oral intake  Hyperkalemia -Resolved -ACEI held -No EKG changes  Hypertension -Continue cardizem -Lasix and benazepril  GERD -Continue PPI  Hypothyroidism -Continue synthroid  Hyperlipidemia -Continue statin  Code Status: Full  Family Communication: None at bedside  Disposition Plan: Admitted  Time Spent in minutes   30 minutes  Procedures  None  Consults   Cardiology  DVT Prophylaxis  Coumadin  Lab Results  Component Value Date   PLT 173 05/28/2014    Medications  Scheduled Meds: . allopurinol  100 mg Oral QHS  . atorvastatin  80 mg Oral QHS  . flecainide  50 mg Oral Q12H  . insulin aspart  0-20 Units Subcutaneous TID WC  . insulin aspart  0-5 Units  Subcutaneous QHS  . insulin aspart  4 Units Subcutaneous TID WC  . insulin detemir  30 Units Subcutaneous QHS  . insulin starter kit- pen needles  1 kit Other Once  . levothyroxine  50 mcg Oral QAC breakfast  . linagliptin  5 mg Oral Daily  . living well with diabetes book   Does not apply Once  . repaglinide  0.5 mg Oral BID AC  . sodium chloride  3 mL Intravenous Q12H  . warfarin  5 mg Oral ONCE-1800  . Warfarin - Pharmacist Dosing Inpatient   Does not apply q1800   Continuous Infusions:  PRN Meds:.acetaminophen **OR** acetaminophen,  hydrALAZINE, ondansetron **OR** ondansetron (ZOFRAN) IV  Antibiotics    Anti-infectives    None        Subjective:   Jarvis Knodel seen and examined today. Patient denies chest pain, SOB, abdominal pain, dizziness.  Inquires about possible cardioversion during this hospitalization.  Objective:   Filed Vitals:   05/27/14 1747 05/27/14 2111 05/28/14 0445 05/28/14 1021  BP: 154/88 128/83 137/86 126/86  Pulse: 108 111 86 114  Temp: 98.1 F (36.7 C) 98 F (36.7 C) 97.9 F (36.6 C) 98.6 F (37 C)  TempSrc: Oral Oral Oral Oral  Resp: _0 Height:      Weight:  74.6 kg (164 lb 7.4 oz)    SpO2: 97% 95%  97%    Wt Readings from Last 3 Encounters:  05/27/14 74.6 kg (164 lb 7.4 oz)  05/19/14 73.846 kg (162 lb 12.8 oz)  04/27/14 78.019 kg (172 lb)     Intake/Output Summary (Last 24 hours) at 05/28/14 1026 Last data filed at 05/28/14 0900  Gross per 24 hour  Intake    840 ml  Output      0 ml  Net    840 ml    Exam  General: Well developed, well nourished, NAD, appears stated age  HEENT: NCAT, mucous membranes moist.   Cardiovascular: S1 S2 auscultated, irregularly irregular  Respiratory: Clear to auscultation bilaterally with equal chest rise  Abdomen: Soft, nontender, nondistended, + bowel sounds  Extremities: warm dry without cyanosis clubbing or edema  Neuro: AAOx3, nonfocal  Psych: Normal affect and demeanor   Data Review   Micro Results No results found for this or any previous visit (from the past 240 hour(s)).  Radiology Reports Dg Chest 2 View  05/16/2014   CLINICAL DATA:  One week of cough and congestion without fever; history of pacemaker placement in December 2015.  EXAM: CHEST  2 VIEW  COMPARISON:  PA and lateral chest of April 27, 2014 and September 09, 2010.  FINDINGS: The lungs are adequately inflated and clear. The heart and pulmonary vascularity appear normal. The permanent pacemaker is in appropriate position radiographically. There  is no pleural effusion or pneumothorax. There are tiny calcified nodules in the left lung measuring up to 3 mm in diameter which are stable since April 2012 and are compatible with previous granulomatous infection.  IMPRESSION: No active cardiopulmonary disease.   Electronically Signed   By: David  Martinique   On: 05/16/2014 11:29    CBC  Recent Labs Lab 05/25/14 0953 05/25/14 1701 05/27/14 0523 05/28/14 0540  WBC 15.1* 12.3* 9.3 9.7  HGB 15.8* 16.8* 13.7 13.9  HCT 47.2* 46.6* 39.1 40.4  PLT 265.0 234 199 173  MCV 95.0 92.6 92.7 92.7  MCH  --  33.4 32.5 31.9  MCHC 33.4 36.1* 35.0 34.4  RDW 13.9  13.6 13.9 14.0  LYMPHSABS 1.1 1.7  --   --   MONOABS 1.1* 1.3*  --   --   EOSABS 0.0 0.1  --   --   BASOSABS 0.0 0.0  --   --     Chemistries   Recent Labs Lab 05/25/14 0953 05/25/14 1701 05/26/14 0901 05/27/14 0523 05/28/14 0540  NA 128* 131* 132* 136 137  K 5.8* 5.3* 5.3* 4.3 3.8  CL 94* 97 98 107 105  CO2 _0 GLUCOSE 586* 490* 427* 81 132*  BUN 57* 57* 49* 43* 37*  CREATININE 2.20* 2.24* 2.08* 1.87* 1.71*  CALCIUM 9.6 9.9 9.7 9.1 8.9  AST  --  19  --   --   --   ALT  --  26  --   --   --   ALKPHOS  --  123*  --   --   --   BILITOT  --  0.8  --   --   --    ------------------------------------------------------------------------------------------------------------------ estimated creatinine clearance is 26.7 mL/min (by C-G formula based on Cr of 1.71). ------------------------------------------------------------------------------------------------------------------  Recent Labs  05/26/14 1100 05/27/14 0523  HGBA1C 10.3* 10.3*   ------------------------------------------------------------------------------------------------------------------ No results for input(s): CHOL, HDL, LDLCALC, TRIG, CHOLHDL, LDLDIRECT in the last 72 hours. ------------------------------------------------------------------------------------------------------------------ No results  for input(s): TSH, T4TOTAL, T3FREE, THYROIDAB in the last 72 hours.  Invalid input(s): FREET3 ------------------------------------------------------------------------------------------------------------------ No results for input(s): VITAMINB12, FOLATE, FERRITIN, TIBC, IRON, RETICCTPCT in the last 72 hours.  Coagulation profile  Recent Labs Lab 05/25/14 1701 05/27/14 0523 05/28/14 0540  INR 2.22* 2.83* 1.90*    No results for input(s): DDIMER in the last 72 hours.  Cardiac Enzymes No results for input(s): CKMB, TROPONINI, MYOGLOBIN in the last 168 hours.  Invalid input(s): CK ------------------------------------------------------------------------------------------------------------------ Invalid input(s): POCBNP    Netha Dafoe D.O. on 05/28/2014 at 10:26 AM  Between 7am to 7pm - Pager - [phone]  After 7pm go to [email or web address] - password [email or web address]  And look for the night coverage person covering for me after hours  [facility] Office  [phone]

## 2014-05-28 NOTE — Progress Notes (Signed)
05/28/2014 1:23 PM  Reviewed with patient on how to draw up and self administer insulin.  I demonstrated the correct procedure, and the patient did a return demonstration with successful drawing up and administering insulin.  Pt asked questions appropriately and involved her husband with the teaching.    [name]

## 2014-05-29 ENCOUNTER — Encounter (HOSPITAL_COMMUNITY): Payer: Self-pay | Admitting: *Deleted

## 2014-05-29 ENCOUNTER — Observation Stay (HOSPITAL_COMMUNITY): Payer: Medicare Other | Admitting: Anesthesiology

## 2014-05-29 ENCOUNTER — Encounter (HOSPITAL_COMMUNITY)
Admission: RE | Disposition: A | Discharge status: Home / Self Care | Payer: Self-pay | Source: Ambulatory Visit | Attending: Internal Medicine

## 2014-05-29 DIAGNOSIS — I340 Nonrheumatic mitral (valve) insufficiency: Secondary | ICD-10-CM | POA: Diagnosis not present

## 2014-05-29 DIAGNOSIS — N184 Chronic kidney disease, stage 4 (severe): Secondary | ICD-10-CM | POA: Diagnosis not present

## 2014-05-29 DIAGNOSIS — R000 Tachycardia, unspecified: Secondary | ICD-10-CM | POA: Diagnosis not present

## 2014-05-29 DIAGNOSIS — I129 Hypertensive chronic kidney disease with stage 1 through stage 4 chronic kidney disease, or unspecified chronic kidney disease: Secondary | ICD-10-CM | POA: Diagnosis not present

## 2014-05-29 DIAGNOSIS — I481 Persistent atrial fibrillation: Secondary | ICD-10-CM

## 2014-05-29 DIAGNOSIS — N183 Chronic kidney disease, stage 3 (moderate): Secondary | ICD-10-CM | POA: Diagnosis not present

## 2014-05-29 DIAGNOSIS — E782 Mixed hyperlipidemia: Secondary | ICD-10-CM | POA: Diagnosis not present

## 2014-05-29 DIAGNOSIS — I10 Essential (primary) hypertension: Secondary | ICD-10-CM | POA: Diagnosis not present

## 2014-05-29 DIAGNOSIS — I4891 Unspecified atrial fibrillation: Secondary | ICD-10-CM | POA: Diagnosis not present

## 2014-05-29 DIAGNOSIS — E1165 Type 2 diabetes mellitus with hyperglycemia: Secondary | ICD-10-CM | POA: Diagnosis not present

## 2014-05-29 DIAGNOSIS — E875 Hyperkalemia: Secondary | ICD-10-CM | POA: Diagnosis not present

## 2014-05-29 DIAGNOSIS — K219 Gastro-esophageal reflux disease without esophagitis: Secondary | ICD-10-CM | POA: Diagnosis not present

## 2014-05-29 DIAGNOSIS — N179 Acute kidney failure, unspecified: Secondary | ICD-10-CM | POA: Diagnosis not present

## 2014-05-29 DIAGNOSIS — E039 Hypothyroidism, unspecified: Secondary | ICD-10-CM | POA: Diagnosis not present

## 2014-05-29 HISTORY — PX: CARDIOVERSION: SHX1299

## 2014-05-29 HISTORY — PX: TEE WITHOUT CARDIOVERSION: SHX5443

## 2014-05-29 LAB — PROTIME-INR
INR: 1.78 — AB (ref 0.00–1.49)
PROTHROMBIN TIME: 20.9 s — AB (ref 11.6–15.2)

## 2014-05-29 LAB — CBC
HCT: 38.5 % (ref 36.0–46.0)
Hemoglobin: 13.3 g/dL (ref 12.0–15.0)
MCH: 32.1 pg (ref 26.0–34.0)
MCHC: 34.5 g/dL (ref 30.0–36.0)
MCV: 93 fL (ref 78.0–100.0)
Platelets: 162 10*3/uL (ref 150–400)
RBC: 4.14 MIL/uL (ref 3.87–5.11)
RDW: 14 % (ref 11.5–15.5)
WBC: 8.8 10*3/uL (ref 4.0–10.5)

## 2014-05-29 LAB — GLUCOSE, CAPILLARY
GLUCOSE-CAPILLARY: 176 mg/dL — AB (ref 70–99)
Glucose-Capillary: 64 mg/dL — ABNORMAL LOW (ref 70–99)
Glucose-Capillary: 199 mg/dL — ABNORMAL HIGH (ref 70–99)
Glucose-Capillary: 72 mg/dL (ref 70–99)

## 2014-05-29 LAB — BASIC METABOLIC PANEL
ANION GAP: 4 — AB (ref 5–15)
BUN: 30 mg/dL — AB (ref 6–23)
CALCIUM: 8.9 mg/dL (ref 8.4–10.5)
CHLORIDE: 107 meq/L (ref 96–112)
CO2: 25 mmol/L (ref 19–32)
Creatinine, Ser: 1.59 mg/dL — ABNORMAL HIGH (ref 0.50–1.10)
GFR calc Af Amer: 35 mL/min — ABNORMAL LOW (ref >90)
GFR calc non Af Amer: 30 mL/min — ABNORMAL LOW (ref >90)
Glucose, Bld: 89 mg/dL (ref 70–99)
POTASSIUM: 4 mmol/L (ref 3.5–5.1)
SODIUM: 136 mmol/L (ref 135–145)

## 2014-05-29 SURGERY — ECHOCARDIOGRAM, TRANSESOPHAGEAL
Anesthesia: Monitor Anesthesia Care

## 2014-05-29 MED ORDER — PROPOFOL 10 MG/ML IV BOLUS
INTRAVENOUS | Status: DC | PRN
Start: 2014-05-29 — End: 2014-05-29
  Administered 2014-05-29: 30 mg via INTRAVENOUS
  Administered 2014-05-29 (×2): 20 mg via INTRAVENOUS

## 2014-05-29 MED ORDER — SODIUM CHLORIDE 0.9 % IV SOLN
INTRAVENOUS | Status: DC | PRN
  Administered 2014-05-29: 13:00:00 via INTRAVENOUS

## 2014-05-29 MED ORDER — INSULIN DETEMIR 100 UNIT/ML ~~LOC~~ SOLN: 30.0000 [IU] | Freq: Every day | SUBCUTANEOUS | Status: DC

## 2014-05-29 MED ORDER — SODIUM CHLORIDE 0.9 % IV SOLN: INTRAVENOUS | Status: DC

## 2014-05-29 MED ORDER — ENOXAPARIN SODIUM 80 MG/0.8ML ~~LOC~~ SOLN: 75.0000 mg | SUBCUTANEOUS | Status: DC

## 2014-05-29 MED ORDER — INSULIN ASPART 100 UNIT/ML FLEXPEN: 4.0000 [IU] | PEN_INJECTOR | Freq: Three times a day (TID) | SUBCUTANEOUS | Status: DC

## 2014-05-29 MED ORDER — FENTANYL CITRATE 0.05 MG/ML IJ SOLN
INTRAMUSCULAR | Status: DC | PRN
  Administered 2014-05-29: 50 ug via INTRAVENOUS

## 2014-05-29 MED ORDER — PROPOFOL INFUSION 10 MG/ML OPTIME
INTRAVENOUS | Status: DC | PRN
  Administered 2014-05-29: 75 ug/kg/min via INTRAVENOUS

## 2014-05-29 MED ORDER — LIDOCAINE VISCOUS 2 % MT SOLN
OROMUCOSAL | Status: DC | PRN
Start: 2014-05-29 — End: 2014-05-29
  Administered 2014-05-29: 20 mL via OROMUCOSAL

## 2014-05-29 MED ORDER — WARFARIN SODIUM 7.5 MG PO TABS
7.5000 mg | ORAL_TABLET | Freq: Once | ORAL | Status: AC
  Administered 2014-05-29: 7.5 mg via ORAL
  Filled 2014-05-29: qty 1

## 2014-05-29 MED ORDER — INSULIN DETEMIR 100 UNIT/ML FLEXPEN: 30.0000 [IU] | PEN_INJECTOR | Freq: Every day | SUBCUTANEOUS | Status: DC

## 2014-05-29 MED ORDER — DEXTROSE 50 % IV SOLN
INTRAVENOUS | Status: AC
  Filled 2014-05-29: qty 50

## 2014-05-29 MED ORDER — DEXTROSE 50 % IV SOLN
1.0000 | Freq: Once | INTRAVENOUS | Status: DC
  Filled 2014-05-29: qty 50

## 2014-05-29 MED ORDER — LIDOCAINE VISCOUS 2 % MT SOLN
OROMUCOSAL | Status: AC
  Filled 2014-05-29: qty 15

## 2014-05-29 NOTE — Transfer of Care (Signed)
Immediate Anesthesia Transfer of Care Note  Patient: Kathryn Richardson  Procedure(s) Performed: Procedure(s): TRANSESOPHAGEAL ECHOCARDIOGRAM (TEE) (N/A) CARDIOVERSION (N/A)  Patient Location: PACU and Endoscopy Unit  Anesthesia Type:MAC  Level of Consciousness: awake, alert , oriented and sedated  Airway & Oxygen Therapy: Patient Spontanous Breathing and Patient connected to nasal cannula oxygen  Post-op Assessment: Report given to PACU RN, Post -op Vital signs reviewed and stable and Patient moving all extremities  Post vital signs: Reviewed and stable  Complications: No apparent anesthesia complications

## 2014-05-29 NOTE — Plan of Care (Signed)
Problem: Food- and Nutrition-Related Knowledge Deficit (NB-1.1) Goal: Nutrition education Formal process to instruct or train a patient/client in a skill or to impart knowledge to help patients/clients voluntarily manage or modify food choices and eating behavior to maintain or improve health. Outcome: Completed/Met Date Met:  05/29/14  RD consulted for nutrition education regarding diabetes.     Lab Results  Component Value Date    HGBA1C 10.3* 05/27/2014    RD provided "Carbohydrate Counting for People with Diabetes" handout from the Academy of [employer]etics. Discussed different food groups and their effects on blood sugar, emphasizing carbohydrate-containing foods. Provided list of carbohydrates and recommended serving sizes of common foods.  Discussed importance of controlled and consistent carbohydrate intake throughout the day. Recommended 3-4 servings of carbohydrates at each meal. Encouraged adequate protein intake. Provided examples of ways to balance meals/snacks and encouraged intake of high-fiber, whole grain complex carbohydrates. Discussed diabetic friendly drink options. Encouraged checking of blood glucose daily.Teach back method used.  Expect good compliance.  Body mass index is 29.26 kg/(m^2). Pt meets criteria for overweight based on current BMI.  Pt is currently NPO for TEE today. Patient has been consuming approximately 65-100% of meals at this time. Pt reports appetite has been good with no other difficulties. Labs and medications reviewed. No further nutrition interventions warranted at this time. RD contact information provided. If additional nutrition issues arise, please re-consult RD.  Kallie Locks, MS, RD, LDN Pager # [phone] After hours/ weekend pager # [phone]

## 2014-05-29 NOTE — Progress Notes (Signed)
[facility]                                                                              Patient Demographics  Kathryn Richardson, is a 80 y.o. female, DOB - 10-Jul-1934, [id number]  Admit date - 05/26/2014   Admitting Physician Cristal Ford, DO  Outpatient Primary MD for the patient is Gara Kroner, MD  LOS - 3   No chief complaint on file.     HPI on 05/26/2014 by Dr. Flonnie Overman Dhungel 80 year old female wit history of type 2 diabetes mellitus, paroxysmal A. fib with hospitalization in November to cardiology service status dual chamber pacemaker placement, now on coumadin, chronic kidney disease stage III ( baseline creatinine around 1.7, follows with nephrologist at [place]) , hypertension, GERD, hypothyroidism presented for cardioversion today. During preprocedure evaluation yesterday she was found to hypoglycemic with blood sugar in 500s. She was sent to the ED where she was given some insulin and once the blood glucose was controlled and she was sent home. Patient reports getting a cortisone injection in her knee on 1/09. Denies polyuria or polydipsia. She denies checking her blood glucose at home for the last few days. Patient denies headache, dizziness, fever, chills, nausea , vomiting, chest pain, palpitations, SOB, abdominal pain, bowel or urinary symptoms. Denies change in weight or appetite. In the endoscopy area she was found to be hypertensive systolic blood pressure in 180s. (Patient had not taken her blood pressure medications today). Blood work done showed blood glucose again in 400s with normal anion gap and potassium of 5.3. Cardioversion was aborted and hospitalist admission requested for patient monitoring and blood sugar control. Patient is on repaglinide and Tradjenta at home and her last A1c was 7.6 in November 2015.  On arrival to the floor her blood pressure was 157/84, afebrile , HR of 85 and O2 sat of 96% on room air  Assessment & Plan   Hyperglycemia/  Diabetes Mellitus, Type 2 -Patient was admitted noted to have hyperglycemia, which has resolved -Last HbA1c in Nov 2015 7.6; Jan 2016 10.3? (likely complicated by recent steroid injection) -Continue home mediations tradjenta and Repaglinide -Continue ISS, CBG monitoring, levemir -DM coordinator consulted  Paroxysmal Atrial fibrillation -Patient was to have cardioversion yesterday 05/26/2014, however became unstable -Continue coumadin with pharmacy to follow -Continue flecainide and cardizem -Cardiology consulted and appreciated, TEE and cardioversion 05/29/2014 -INR subtherapeutic, continue on full dose lovenox  Acute on chronic kidney disease, stage III -Upon admission 2.24, currently 1.59 -Baseline creatinine 1.7-1.8 -Possibly secondary to dehydration and poor oral intake  Hyperkalemia -Resolved -ACEI held -No EKG changes  Hypertension -Continue cardizem -Lasix and benazepril  GERD -Continue PPI  Hypothyroidism -Continue synthroid  Hyperlipidemia -Continue statin  Code Status: Full  Family Communication: None at bedside  Disposition Plan: Admitted  Time Spent in minutes   30 minutes  Procedures  TEE  Cardioversion  Consults   Cardiology  DVT Prophylaxis  Coumadin  Lab Results  Component Value Date   PLT 162 05/29/2014    Medications  Scheduled Meds: . [MAR Hold] allopurinol  100 mg Oral QHS  . [MAR Hold] atorvastatin  80 mg Oral QHS  . dextrose  1  ampule Intravenous Once  . dextrose      . [MAR Hold] enoxaparin (LOVENOX) injection  75 mg Subcutaneous Q24H  . [MAR Hold] flecainide  50 mg Oral Q12H  . [MAR Hold] insulin aspart  0-20 Units Subcutaneous TID WC  . [MAR Hold] insulin aspart  0-5 Units Subcutaneous QHS  . [MAR Hold] insulin aspart  4 Units Subcutaneous TID WC  . [MAR Hold] insulin detemir  30 Units Subcutaneous QHS  . [MAR Hold] levothyroxine  50 mcg Oral QAC breakfast  . [MAR Hold] linagliptin  5 mg Oral Daily  . [MAR Hold]  repaglinide  0.5 mg Oral BID AC  . [MAR Hold] sodium chloride  3 mL Intravenous Q12H  . [MAR Hold] sodium chloride  3 mL Intravenous Q12H  . [MAR Hold] warfarin  7.5 mg Oral ONCE-1800  . [MAR Hold] Warfarin - Pharmacist Dosing Inpatient   Does not apply q1800   Continuous Infusions: . sodium chloride    . sodium chloride     PRN Meds:.[MAR Hold] acetaminophen **OR** [MAR Hold] acetaminophen, [MAR Hold] hydrALAZINE, [MAR Hold] ondansetron **OR** [MAR Hold] ondansetron (ZOFRAN) IV, [MAR Hold] sodium chloride  Antibiotics    Anti-infectives    None        Subjective:   Alyssa Majcher seen and examined today. Patient denies chest pain, SOB, abdominal pain, dizziness.   Objective:   Filed Vitals:   05/29/14 0500 05/29/14 1000 05/29/14 1142 05/29/14 1143  BP: 139/88 184/71  212/68  Pulse: 97 56 113   Temp: 98.1 F (36.7 C) 98.2 F (36.8 C) 98.1 F (36.7 C)   TempSrc: Oral Oral Oral   Resp: 18 18 19    Height:      Weight:      SpO2: 95% 98% 95%     Wt Readings from Last 3 Encounters:  05/28/14 74.9 kg (165 lb 2 oz)  05/19/14 73.846 kg (162 lb 12.8 oz)  04/27/14 78.019 kg (172 lb)     Intake/Output Summary (Last 24 hours) at 05/29/14 1351 Last data filed at 05/29/14 1341  Gross per 24 hour  Intake    290 ml  Output      0 ml  Net    290 ml    Exam  General: Well developed, well nourished, NAD, appears stated age  20: NCAT, mucous membranes moist.   Cardiovascular: S1 S2 auscultated, irregularly irregular  Respiratory: Clear to auscultation bilaterally with equal chest rise  Abdomen: Soft, nontender, nondistended, + bowel sounds  Extremities: warm dry without cyanosis clubbing or edema  Neuro: AAOx3, nonfocal  Psych: Normal affect and demeanor   Data Review   Micro Results No results found for this or any previous visit (from the past 240 hour(s)).  Radiology Reports Dg Chest 2 View  05/16/2014   CLINICAL DATA:  One week of cough and congestion  without fever; history of pacemaker placement in December 2015.  EXAM: CHEST  2 VIEW  COMPARISON:  PA and lateral chest of April 27, 2014 and September 09, 2010.  FINDINGS: The lungs are adequately inflated and clear. The heart and pulmonary vascularity appear normal. The permanent pacemaker is in appropriate position radiographically. There is no pleural effusion or pneumothorax. There are tiny calcified nodules in the left lung measuring up to 3 mm in diameter which are stable since April 2012 and are compatible with previous granulomatous infection.  IMPRESSION: No active cardiopulmonary disease.   Electronically Signed   By: David  Martinique  On: 05/16/2014 11:29    CBC  Recent Labs Lab 05/25/14 0953 05/25/14 1701 05/27/14 0523 05/28/14 0540 05/29/14 0450  WBC 15.1* 12.3* 9.3 9.7 8.8  HGB 15.8* 16.8* 13.7 13.9 13.3  HCT 47.2* 46.6* 39.1 40.4 38.5  PLT 265.0 234 199 173 162  MCV 95.0 92.6 92.7 92.7 93.0  MCH  --  33.4 32.5 31.9 32.1  MCHC 33.4 36.1* 35.0 34.4 34.5  RDW 13.9 13.6 13.9 14.0 14.0  LYMPHSABS 1.1 1.7  --   --   --   MONOABS 1.1* 1.3*  --   --   --   EOSABS 0.0 0.1  --   --   --   BASOSABS 0.0 0.0  --   --   --     Chemistries   Recent Labs Lab 05/25/14 1701 05/26/14 0901 05/27/14 0523 05/28/14 0540 05/29/14 0450  NA 131* 132* 136 137 136  K 5.3* 5.3* 4.3 3.8 4.0  CL 97 98 107 105 107  CO2 23 19 25 26 25   GLUCOSE 490* 427* 81 132* 89  BUN 57* 49* 43* 37* 30*  CREATININE 2.24* 2.08* 1.87* 1.71* 1.59*  CALCIUM 9.9 9.7 9.1 8.9 8.9  AST 19  --   --   --   --   ALT 26  --   --   --   --   ALKPHOS 123*  --   --   --   --   BILITOT 0.8  --   --   --   --    ------------------------------------------------------------------------------------------------------------------ estimated creatinine clearance is 28.7 mL/min (by C-G formula based on Cr of  1.59). ------------------------------------------------------------------------------------------------------------------  Recent Labs  05/27/14 0523  HGBA1C 10.3*   ------------------------------------------------------------------------------------------------------------------ No results for input(s): CHOL, HDL, LDLCALC, TRIG, CHOLHDL, LDLDIRECT in the last 72 hours. ------------------------------------------------------------------------------------------------------------------ No results for input(s): TSH, T4TOTAL, T3FREE, THYROIDAB in the last 72 hours.  Invalid input(s): FREET3 ------------------------------------------------------------------------------------------------------------------ No results for input(s): VITAMINB12, FOLATE, FERRITIN, TIBC, IRON, RETICCTPCT in the last 72 hours.  Coagulation profile  Recent Labs Lab 05/25/14 1701 05/27/14 0523 05/28/14 0540 05/29/14 0450  INR 2.22* 2.83* 1.90* 1.78*    No results for input(s): DDIMER in the last 72 hours.  Cardiac Enzymes No results for input(s): CKMB, TROPONINI, MYOGLOBIN in the last 168 hours.  Invalid input(s): CK ------------------------------------------------------------------------------------------------------------------ Invalid input(s): POCBNP    Kyan Yurkovich D.O. on 05/29/2014 at 1:51 PM  Between 7am to 7pm - Pager - [phone]  After 7pm go to [email or web address] - password [email or web address]  And look for the night coverage person covering for me after hours  [facility] Office  [phone]

## 2014-05-29 NOTE — Anesthesia Postprocedure Evaluation (Signed)
  Anesthesia Post-op Note  Patient: Kathryn Richardson  Procedure(s) Performed: Procedure(s): TRANSESOPHAGEAL ECHOCARDIOGRAM (TEE) (N/A) CARDIOVERSION (N/A)  Patient Location: PACU  Anesthesia Type:General  Level of Consciousness: awake  Airway and Oxygen Therapy: Patient Spontanous Breathing  Post-op Pain: mild  Post-op Assessment: Post-op Vital signs reviewed  Post-op Vital Signs: Reviewed  Last Vitals:  Filed Vitals:   05/29/14 1400  BP: 191/85  Pulse: 79  Temp:   Resp: 16    Complications: No apparent anesthesia complications

## 2014-05-29 NOTE — H&P (View-Only) (Signed)
Admit date: 05/26/2014 Referring Physician  Dr. Clementeen Graham Primary Physician Gara Kroner, MD Primary Cardiologist  Dr. Debara Pickett Reason for Consultation  Afib ? cardioversion  HPI: 80 year old with paroxysmal atrial fibrillation, dual-chamber pacemaker, chronic anticoagulation with Coumadin, chronic kidney disease with baseline creatinine of 1.7, stage III, hypertension, diabetes who on 05/26/14 presented for cardioversion and while in endoscopy she was found to have blood pressures in the [id number] systolic and blood glucose in 400s with anion gap and potassium of 5.3. Cardioversion was aborted.  Hyperglycemia has resolved.  Question now is she qualified for cardioversion currently.  On flecainide, Coumadin.    PMH:   Past Medical History  Diagnosis Date  . Essential hypertension   . Gout   . GERD (gastroesophageal reflux disease)   . Insomnia   . Atrial fibrillation   . Type 2 diabetes mellitus   . Allergic rhinitis   . Hyperlipemia, mixed   . Chronic renal disease, stage IV     PSH:   Past Surgical History  Procedure Laterality Date  . Cesarean section      x2  . Esophageal dilation    . Permanent pacemaker insertion N/A 04/26/2014    Procedure: PERMANENT PACEMAKER INSERTION;  Surgeon: Evans Lance, MD;  Location: [facility] CATH LAB;  Service: Cardiovascular;  Laterality: N/A;   Allergies:  Cortisone and Prednisone Prior to Admit Meds:   Prior to Admission medications   Medication Sig Start Date End Date Taking? Authorizing Provider  allopurinol (ZYLOPRIM) 100 MG tablet Take 100 mg by mouth at bedtime.   Yes Historical Provider, MD  atorvastatin (LIPITOR) 80 MG tablet Take 80 mg by mouth at bedtime.    Yes Historical Provider, MD  benazepril (LOTENSIN) 20 MG tablet Take 20 mg by mouth daily.   Yes Historical Provider, MD  diltiazem (CARDIZEM CD) 240 MG 24 hr capsule Take 1 capsule (240 mg total) by mouth daily. 04/27/14  Yes Rhonda G Barrett, PA-C  flecainide (TAMBOCOR) 100 MG  tablet TAKE 1 TABLET BY MOUTH EVERY 12 HOURS 05/19/14  Yes Roderic Palau, NP  furosemide (LASIX) 40 MG tablet Take 40 mg by mouth daily.   Yes Historical Provider, MD  KLOR-CON M20 20 MEQ tablet Take 20 mEq by mouth daily. 04/09/14  Yes Historical Provider, MD  levothyroxine (SYNTHROID, LEVOTHROID) 50 MCG tablet Take 50 mcg by mouth daily. On a empty stomach.   Yes Historical Provider, MD  linagliptin (TRADJENTA) 5 MG TABS tablet Take 5 mg by mouth daily.   Yes Historical Provider, MD  repaglinide (PRANDIN) 0.5 MG tablet Take 0.5 mg by mouth 2 (two) times daily before a meal.   Yes Historical Provider, MD  warfarin (COUMADIN) 5 MG tablet Take as directed by coumadin clinic Patient taking differently: Take 2.5-5 mg by mouth daily at 6 PM. Take 1 tablet (5 mg) on (Mon, Wed) & Take 1/2 tablet (2.5 mg) on (Tues, Thurs, Fri, Sat & Sun). 12/22/13  Yes Jettie Booze, MD  repaglinide (PRANDIN) 0.5 MG tablet Take 2 tablets (1 mg total) by mouth 2 (two) times daily before a meal. 05/26/14   [name], MD   Fam HX:    Family History  Problem Relation Age of Onset  . Coronary artery disease Father 31   Social HX:    History   Social History  . Marital Status: Married    Spouse Name: N/A    Number of Children: N/A  . Years of Education: N/A  Occupational History  . Not on file.   Social History Main Topics  . Smoking status: Never Smoker   . Smokeless tobacco: Not on file  . Alcohol Use: No  . Drug Use: No  . Sexual Activity: Not on file   Other Topics Concern  . Not on file   Social History Narrative   Lives in [place].  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 rate 18, height 5\' 3"  (1.6 m),  weight 164 lb 7.4 oz (74.6 kg), SpO2 97 %.   General: Well developed, well nourished, in no acute distress Head: Eyes PERRLA, No xanthomas.   Normal cephalic and atramatic  Lungs:   Clear bilaterally to auscultation and percussion. Normal respiratory effort. No wheezes, no rales. Heart:   Irregularly irregular tachy S1 S2 Pulses are 2+ & equal. No murmur, rubs, gallops.  No carotid bruit. No JVD.  No abdominal bruits.  Abdomen: Bowel sounds are positive, abdomen soft and non-tender without masses. No hepatosplenomegaly. Msk:  Back normal. Normal strength and tone for age. Extremities:  No clubbing, cyanosis or edema.  DP +1 Neuro: Alert and oriented X 3, non-focal, MAE x 4 GU: Deferred Rectal: Deferred Psych:  Good affect, responds appropriately      Labs: Lab Results  Component Value Date   WBC 9.7 05/28/2014   HGB 13.9 05/28/2014   HCT 40.4 05/28/2014   MCV 92.7 05/28/2014   PLT 173 05/28/2014     Recent Labs Lab 05/25/14 1701  05/28/14 0540  NA 131*  < > 137  K 5.3*  < > 3.8  CL 97  < > 105  CO2 23  < > 26  BUN 57*  < > 37*  CREATININE 2.24*  < > 1.71*  CALCIUM 9.9  < > 8.9  PROT 6.9  --   --   BILITOT 0.8  --   --   ALKPHOS 123*  --   --   ALT 26  --   --   AST 19  --   --   GLUCOSE 490*  < > 132*  < > = values in this interval not displayed. No results for input(s): CKTOTAL, CKMB, TROPONINI in the last 72 hours. No results found for: CHOL, HDL, LDLCALC, TRIG Lab Results  Component Value Date   DDIMER <0.27 04/25/2014   EKG:  05/25/14-atrial fibrillation heart rate 121 with left bundle branch block like morphology Personally viewed.   ASSESSMENT/PLAN:    80 year old female with paroxysmal atrial fibrillation on flecainide, Coumadin with canceled cardioversion on 05/26/14 secondary to severe hyperglycemia and hypertension.  1. Paroxysmal atrial fibrillation  - She may proceed with cardioversion, but unfortunately her INR now is 1.9. I will place her on  add-on schedule for tomorrow, but she will need transesophageal echocardiogram prior to her cardioversion technically since she is subtherapeutic. I have discussed this with her. Another option would be to have her return after 3 weeks of optimal INR, greater than 2 but her heart rate is in the 130 range with activity. She would like to go ahead and get this taken care of. I agree. Nothing by mouth past midnight.  2. Chronic anticoagulation  - Currently  INR is 1.9.   - Previous INR was 2.8.  - I will ask pharmacy to dose Lovenox for atrial fibrillation to ensure that she is fully anticoagulated prior to cardioversion tomorrow.  - Repeat INR in a.m.  3. Diabetes with hyperglycemia-now controlled. Appreciate hospitalist service.   We'll know more tomorrow about TEE cardioversion timing.  Candee Furbish, MD  05/28/2014  2:31 PM

## 2014-05-29 NOTE — Op Note (Signed)
Patient anesthetized with propofol by anesthesia With pads in AP position patient cardioverted to SR with 150 J synchronized biphasic energy Pacer interrogated Procedure without complication.

## 2014-05-29 NOTE — Anesthesia Preprocedure Evaluation (Addendum)
Anesthesia Evaluation  Patient identified by MRN, date of birth, ID band Patient awake    Reviewed: Allergy & Precautions, NPO status , Patient's Chart, lab work & pertinent test results  Airway        Dental   Pulmonary neg pulmonary ROS,          Cardiovascular hypertension,     Neuro/Psych    GI/Hepatic Neg liver ROS, GERD-  ,  Endo/Other  diabetesHypothyroidism   Renal/GU Renal disease     Musculoskeletal   Abdominal   Peds  Hematology   Anesthesia Other Findings   Reproductive/Obstetrics                            Anesthesia Physical Anesthesia Plan  ASA: III  Anesthesia Plan:    Post-op Pain Management:    Induction:   Airway Management Planned:   Additional Equipment:   Intra-op Plan:   Post-operative Plan:   Informed Consent:   Plan Discussed with:   Anesthesia Plan Comments:         Anesthesia Quick Evaluation

## 2014-05-29 NOTE — Progress Notes (Signed)
Hypoglycemic Event  CBG: 64  Treatment: D50 IV 25 mL  Symptoms: Nervous/irritable  Follow-up CBG: Time:0830 CBG Result:199   Possible Reasons for Event: Inadequate meal intake  Comments/MD notified: MD aware     Kathryn Richardson A  Remember to initiate Hypoglycemia Order Set & complete

## 2014-05-29 NOTE — Progress Notes (Signed)
Pt. Discharged home with husband. Pt verbalized signs and symptoms of worsening condition and when to call the doctor. Educated patient on self-administering Lovenox injections, patient demonstrated with teach back. Follow up appointments discussed. Pt. Hemodynamically stable.   Penni Bombard, RN 5:29 PM 05/29/2014

## 2014-05-29 NOTE — Progress Notes (Signed)
Echocardiogram Echocardiogram Transesophageal has been performed.  Joelene Millin 05/29/2014, 2:11 PM

## 2014-05-29 NOTE — Progress Notes (Signed)
ANTICOAGULATION CONSULT NOTE - Followup Consult  Pharmacy Consult for Warfarin and Lovenox Indication: atrial fibrillation  Allergies  Allergen Reactions  . Cortisone Other (See Comments)    Hyperglycemia   . Prednisone Other (See Comments)    Hyperglycemia     Patient Measurements: Height: 5\' 3"  (160 cm) Weight: 165 lb 2 oz (74.9 kg) IBW/kg (Calculated) : 52.4  Vital Signs: Temp: 98.1 F (36.7 C) (01/18 0500) Temp Source: Oral (01/18 0500) BP: 139/88 mmHg (01/18 0500) Pulse Rate: 97 (01/18 0500)  Labs:  Recent Labs  05/27/14 0523 05/28/14 0540 05/29/14 0450  HGB 13.7 13.9 13.3  HCT 39.1 40.4 38.5  PLT 199 173 162  LABPROT 30.0* 22.0* 20.9*  INR 2.83* 1.90* 1.78*  CREATININE 1.87* 1.71* 1.59*    Estimated Creatinine Clearance: 28.7 mL/min (by C-G formula based on Cr of 1.59).  Assessment: Kathryn Richardson with Afib who presented for cardioversion. Her home dose was resumed, but due to a combination of errors, a dose was missed on 1/16. Because of this, her INR dropped and she was started on Lovenox for full anticoagulation yesterday. INR this morning is still low at 1.78. Hgb and plts are WNL, no bleeding noted.  Home Coumadin dose: 5 mg on Mon, Wed; 2.5 mg on all other days.  Goal of Therapy:  INR 2-3 Monitor platelets by anticoagulation protocol: Yes   Plan:  -Warfarin 7.5mg  x1 tonight  -continue Lovenox 75mg  subQ q24h -Monitor daily PT/INR and s/s of bleeding  -follow up results of cardioversion today and plans for discharge. If stable post-cardioversion and ready for discharge per cardiology, can take warfarin 7.5mg  po x1 tonight and resume home dose starting 1/19 with an INR check on Friday 1/22  Ariyanna Oien D. Nunzio Banet, PharmD, BCPS Clinical Pharmacist Pager: [phone] 05/29/2014 10:00 AM

## 2014-05-29 NOTE — Care Management Note (Signed)
CARE MANAGEMENT NOTE 05/29/2014  Patient:  Kathryn Richardson, Kathryn Richardson   Account Number:  [id number]  Date Initiated:  05/26/2014  Documentation initiated by:  Shamica Moree  Subjective/Objective Assessment:   CM following for progression and d/c planning.     Action/Plan:   Pt adm as INPT however does not meet criteria for INPT, adm MD notified, await response.  05/29/14 For TEE and Cardioversion today, then d/c.   Anticipated DC Date:  05/29/2014   Anticipated DC Plan:  HOME/SELF CARE         Choice offered to / List presented to:             Status of service:   Medicare Important Message given?  YES (If response is "NO", the following Medicare IM given date fields will be blank) Date Medicare IM given:  05/29/2014 Medicare IM given by:  Sophy Mesler Date Additional Medicare IM given:   Additional Medicare IM given by:    Discharge Disposition:    Per UR Regulation:    If discussed at Long Length of Stay Meetings, dates discussed:    Comments:

## 2014-05-29 NOTE — Discharge Summary (Signed)
Physician Discharge Summary  Kathryn Richardson [id number] DOB: [birth year] DOA: 05/26/2014  PCP: Gara Kroner, MD  Admit date: 05/26/2014 Discharge date: 05/29/2014  Time spent: 45 minutes  Recommendations for Outpatient Follow-up:  Patient will be discharged to home.  She will need to followup with her primary care physician .  Patient should have her INR checked with 2-3 days of discharge.  Patient will need to follow up with cardiology at the specified time.  She will need to continue her medications as prescribed.  She should follow a carb modified diet.    Discharge Diagnoses:  Principal Problem:   Uncontrolled diabetes mellitus Active Problems:   Paroxysmal atrial fibrillation   Mixed hyperlipidemia   Acute renal failure superimposed on stage 3 chronic kidney disease   Uncontrolled hypertension   Hyperkalemia   GERD (gastroesophageal reflux disease)   Hypothyroidism   Discharge Condition:Stable  Diet recommendation: Carb modified  Filed Weights   05/26/14 2118 05/27/14 2111 05/28/14 2100  Weight: 72.8 kg (160 lb 7.9 oz) 74.6 kg (164 lb 7.4 oz) 74.9 kg (165 lb 2 oz)    History of present illness:  on 05/26/2014 by Dr. Flonnie Overman Dhungel 80 year old female wit history of type 2 diabetes mellitus, paroxysmal A. fib with hospitalization in November to cardiology service status dual chamber pacemaker placement, now on coumadin, chronic kidney disease stage III ( baseline creatinine around 1.7, follows with nephrologist at [facility]) , hypertension, GERD, hypothyroidism presented for cardioversion today. During preprocedure evaluation yesterday she was found to hypoglycemic with blood sugar in 500s. She was sent to the ED where she was given some insulin and once the blood glucose was controlled and she was sent home. Patient reports getting a cortisone injection in her knee on 1/09. Denies polyuria or polydipsia. She denies checking her blood glucose at home for the last few  days. Patient denies headache, dizziness, fever, chills, nausea , vomiting, chest pain, palpitations, SOB, abdominal pain, bowel or urinary symptoms. Denies change in weight or appetite. In the endoscopy area she was found to be hypertensive systolic blood pressure in 180s. (Patient had not taken her blood pressure medications today). Blood work done showed blood glucose again in 400s with normal anion gap and potassium of 5.3. Cardioversion was aborted and hospitalist admission requested for patient monitoring and blood sugar control. Patient is on repaglinide and Tradjenta at home and her last A1c was 7.6 in November 2015. On arrival to the floor her blood pressure was 157/84, afebrile , HR of 85 and O2 sat of 96% on room air  Hospital Course:  Hyperglycemia/ Diabetes Mellitus, Type 2 -Patient was admitted noted to have hyperglycemia, which has resolved -Last HbA1c in Nov 2015 7.6; Jan 2016 10.3? (likely complicated by recent steroid injection) -Continue home mediations tradjenta and Repaglinide -Was placed on continue ISS, CBG monitoring, levemir -DM coordinator consulted and recommended discharging patient her home medications and no insulin -Patient will need to speak to her PCP regarding her diabetes management.    Paroxysmal Atrial fibrillation -Patient was to have cardioversion yesterday 05/26/2014, however became unstable -Continue coumadin with pharmacy to follow -Continue flecainide and cardizem -Cardiology consulted and appreciated, TEE and cardioversion 05/29/2014 -INR subtherapeutic, continue on full dose lovenox- patient will be sent home with lovenox injections -She is to have her INR checked on 1/20 or 1/21.  Acute on chronic kidney disease, stage III -Upon admission 2.24, currently 1.59 -Baseline creatinine 1.7-1.8 -Possibly secondary to dehydration and poor oral intake  Hyperkalemia -Resolved -  ACEI held, but may be resumed at discharge -No EKG  changes  Hypertension -Continue cardizem -Lasix and benazepril held but may be resumed at discharge  GERD -Continue PPI  Hypothyroidism -Continue synthroid  Hyperlipidemia -Continue statin  Procedures: TEE Cardioversion  Consultations: Cardiology  Discharge Exam: Filed Vitals:   05/29/14 1400  BP: 191/85  Pulse: 79  Temp:   Resp: 16   Exam  General: Well developed, well nourished, NAD, appears stated age  HEENT: NCAT, mucous membranes moist.   Cardiovascular: S1 S2 auscultated, RRR  Respiratory: Clear to auscultation bilaterally with equal chest rise  Abdomen: Soft, nontender, nondistended, + bowel sounds  Extremities: warm dry without cyanosis clubbing or edema  Neuro: AAOx3, nonfocal  Psych: Normal affect and demeanor  Discharge Instructions      Discharge Instructions    Discharge instructions    Complete by:  As directed   Patient will be discharged to home.  She will need to followup with her primary care physician within 1 week.  Patient should have her INR checked with 2-3 days of discharge.  Patient will need to follow up with cardiology at the specified time.  She will need to continue her medications as prescribed.  She should follow a carb modified diet.     Discharge patient    Complete by:  As directed      Increase activity slowly    Complete by:  As directed             Medication List    TAKE these medications        allopurinol 100 MG tablet  Commonly known as:  ZYLOPRIM  Take 100 mg by mouth at bedtime.     atorvastatin 80 MG tablet  Commonly known as:  LIPITOR  Take 80 mg by mouth at bedtime.     benazepril 20 MG tablet  Commonly known as:  LOTENSIN  Take 20 mg by mouth daily.     diltiazem 240 MG 24 hr capsule  Commonly known as:  CARDIZEM CD  Take 1 capsule (240 mg total) by mouth daily.     enoxaparin 80 MG/0.8ML injection  Commonly known as:  LOVENOX  Inject 0.75 mLs (75 mg total) into the skin daily.      flecainide 100 MG tablet  Commonly known as:  TAMBOCOR  TAKE 1 TABLET BY MOUTH EVERY 12 HOURS     furosemide 40 MG tablet  Commonly known as:  LASIX  Take 40 mg by mouth daily.     KLOR-CON M20 20 MEQ tablet  Generic drug:  potassium chloride SA  Take 20 mEq by mouth daily.     levothyroxine 50 MCG tablet  Commonly known as:  SYNTHROID, LEVOTHROID  Take 50 mcg by mouth daily. On a empty stomach.     repaglinide 0.5 MG tablet  Commonly known as:  PRANDIN  Take 0.5 mg by mouth 2 (two) times daily before a meal.     TRADJENTA 5 MG Tabs tablet  Generic drug:  linagliptin  Take 5 mg by mouth daily.     warfarin 5 MG tablet  Commonly known as:  COUMADIN  Take as directed by coumadin clinic       Allergies  Allergen Reactions  . Cortisone Other (See Comments)    Hyperglycemia   . Prednisone Other (See Comments)    Hyperglycemia    Follow-up Information    Follow up with Gara Kroner, MD. Schedule an appointment as soon  as possible for a visit in 3 days.   Specialty:  Family Medicine   Contact information:   [address] [place] [address] [phone]       Follow up with Cristopher Peru, MD On 08/01/2014.   Specialty:  Cardiology   Why:  8:30 AM   Contact information:   [id number] [place] [address] [phone]       Follow up with Doristine Devoid On 06/05/2014.   Why:  11:00 AM   Contact information:   [place] [id number] [place] [address] [phone]      Follow up with [facility] On 05/31/2014.   Why:  2:00 PM   Contact information:   [address] [place] [address] [phone]       The results of significant diagnostics from this hospitalization (including imaging, microbiology, ancillary and laboratory) are listed below for reference.    Significant Diagnostic Studies: Dg Chest 2 View  05/16/2014   CLINICAL DATA:  One week of cough  and congestion without fever; history of pacemaker placement in December 2015.  EXAM: CHEST  2 VIEW  COMPARISON:  PA and lateral chest of April 27, 2014 and September 09, 2010.  FINDINGS: The lungs are adequately inflated and clear. The heart and pulmonary vascularity appear normal. The permanent pacemaker is in appropriate position radiographically. There is no pleural effusion or pneumothorax. There are tiny calcified nodules in the left lung measuring up to 3 mm in diameter which are stable since April 2012 and are compatible with previous granulomatous infection.  IMPRESSION: No active cardiopulmonary disease.   Electronically Signed   By: David  Martinique   On: 05/16/2014 11:29    Microbiology: No results found for this or any previous visit (from the past 240 hour(s)).   Labs: Basic Metabolic Panel:  Recent Labs Lab 05/25/14 1701 05/26/14 0901 05/27/14 0523 05/28/14 0540 05/29/14 0450  NA 131* 132* 136 137 136  K 5.3* 5.3* 4.3 3.8 4.0  CL 97 98 107 105 107  CO2 23 19 25 26 25   GLUCOSE 490* 427* 81 132* 89  BUN 57* 49* 43* 37* 30*  CREATININE 2.24* 2.08* 1.87* 1.71* 1.59*  CALCIUM 9.9 9.7 9.1 8.9 8.9   Liver Function Tests:  Recent Labs Lab 05/25/14 1701  AST 19  ALT 26  ALKPHOS 123*  BILITOT 0.8  PROT 6.9  ALBUMIN 3.9   No results for input(s): LIPASE, AMYLASE in the last 168 hours. No results for input(s): AMMONIA in the last 168 hours. CBC:  Recent Labs Lab 05/25/14 0953 05/25/14 1701 05/27/14 0523 05/28/14 0540 05/29/14 0450  WBC 15.1* 12.3* 9.3 9.7 8.8  NEUTROABS 12.8* 9.3*  --   --   --   HGB 15.8* 16.8* 13.7 13.9 13.3  HCT 47.2* 46.6* 39.1 40.4 38.5  MCV 95.0 92.6 92.7 92.7 93.0  PLT 265.0 234 199 173 162   Cardiac Enzymes: No results for input(s): CKTOTAL, CKMB, CKMBINDEX, TROPONINI in the last 168 hours. BNP: BNP (last 3 results)  Recent Labs  04/25/14 1530  PROBNP 4716.0*   CBG:  Recent Labs Lab 05/28/14 1645 05/28/14 2143  05/29/14 0753 05/29/14 0903 05/29/14 1414  GLUCAP 140* 163* 64* 199* 72       Signed:  Maheen Cwikla  [facility] 05/29/2014, 3:49 PM

## 2014-05-29 NOTE — Op Note (Signed)
LA, LA appendage without masses MV mildly thickened.  Mild MR AV mildly thickened TV normal  Mild TR PV normal LVEF appears mildly depressed  Difficult to fully assess LVEF with afib  Full report to follow

## 2014-05-29 NOTE — Interval H&P Note (Signed)
History and Physical Interval Note:  05/29/2014 8:43 AM  Kathryn Richardson  has presented today for surgery, with the diagnosis of a fib  The various methods of treatment have been discussed with the patient and family. After consideration of risks, benefits and other options for treatment, the patient has consented to  Procedure(s): TRANSESOPHAGEAL ECHOCARDIOGRAM (TEE) (N/A) CARDIOVERSION (N/A) as a surgical intervention .  The patient's history has been reviewed, patient examined, no change in status, stable for surgery.  I have reviewed the patient's chart and labs.  Questions were answered to the patient's satisfaction.     Dorris Carnes

## 2014-05-29 NOTE — Progress Notes (Signed)
Inpatient Diabetes Program Recommendations  AACE/ADA: New Consensus Statement on Inpatient Glycemic Control (2013)  Target Ranges:  Prepandial:   less than 140 mg/dL      Peak postprandial:   less than 180 mg/dL (1-2 hours)      Critically ill patients:  140 - 180 mg/dL   Spoke with Dr. Ree Kida regarding HgbA1c results of this admission following steroid injection(s). A1C most probably very influenced by steroid therapy and pt has been dehydrated for some time potentially causing a false elevation. Prior A1C in Dec was at 7.7%. Patient has had some kidney insufficiency as well, also potentially causing a falsely high A1C.  MD states pt is very concerned re taking insulin at home (in addition to the lovenox injection). Pt presently on Januvia and Prandin with meals at home. Pt will follow-up with PCP regarding adding another agent to her diabetes regimen at this time since the present lab values may not have been accurate. Pt has appt with PCP in next 3 weeks.  Thank you, Rosita Kea, RN, CNS, Diabetes Coordinator [phone])

## 2014-05-30 ENCOUNTER — Encounter (HOSPITAL_COMMUNITY): Payer: Self-pay | Admitting: Internal Medicine

## 2014-05-31 ENCOUNTER — Ambulatory Visit (INDEPENDENT_AMBULATORY_CARE_PROVIDER_SITE_OTHER): Payer: Medicare Other | Admitting: *Deleted

## 2014-05-31 ENCOUNTER — Telehealth: Payer: Self-pay | Admitting: *Deleted

## 2014-05-31 DIAGNOSIS — Z5181 Encounter for therapeutic drug level monitoring: Secondary | ICD-10-CM

## 2014-05-31 DIAGNOSIS — Z79899 Other long term (current) drug therapy: Principal | ICD-10-CM

## 2014-05-31 DIAGNOSIS — I481 Persistent atrial fibrillation: Secondary | ICD-10-CM

## 2014-05-31 DIAGNOSIS — I4819 Other persistent atrial fibrillation: Secondary | ICD-10-CM

## 2014-05-31 DIAGNOSIS — I4891 Unspecified atrial fibrillation: Secondary | ICD-10-CM

## 2014-05-31 LAB — FLECAINIDE LEVEL: Flecainide: 1.01 ug/mL (ref 0.20–1.00)

## 2014-05-31 LAB — POCT INR: INR: 2.7

## 2014-05-31 NOTE — Telephone Encounter (Signed)
Received a call from [facility] lab about Flecainide level done in the hospital, level 1.01 and normal range 0.20-1.00 Message sent to Geroge Baseman NP by Janan Halter RN and Donna's recommendation to decrease Flecainide to 75 mg twice a day and recheck early next week Advised patient, verbalized understanding. Patient will recheck at her ov next week with Geroge Baseman NP 06/05/14

## 2014-06-02 NOTE — CV Procedure (Signed)
Patient anesthetized by anesthesia with propofol WIth pads in AP position patient cardioverted to SR with 150 J synchronized biphasic energy Procedure without compllication.

## 2014-06-05 ENCOUNTER — Encounter: Payer: Medicare Other | Admitting: Nurse Practitioner

## 2014-06-05 ENCOUNTER — Other Ambulatory Visit: Payer: Medicare Other

## 2014-06-07 ENCOUNTER — Encounter: Payer: Self-pay | Admitting: Nurse Practitioner

## 2014-06-07 ENCOUNTER — Ambulatory Visit (INDEPENDENT_AMBULATORY_CARE_PROVIDER_SITE_OTHER): Payer: Medicare Other | Admitting: *Deleted

## 2014-06-07 ENCOUNTER — Other Ambulatory Visit (INDEPENDENT_AMBULATORY_CARE_PROVIDER_SITE_OTHER): Payer: Medicare Other | Admitting: *Deleted

## 2014-06-07 ENCOUNTER — Ambulatory Visit (INDEPENDENT_AMBULATORY_CARE_PROVIDER_SITE_OTHER): Payer: Medicare Other | Admitting: Nurse Practitioner

## 2014-06-07 ENCOUNTER — Other Ambulatory Visit: Payer: Self-pay | Admitting: *Deleted

## 2014-06-07 VITALS — BP 120/64 | HR 73 | Ht 63.0 in | Wt 160.8 lb

## 2014-06-07 DIAGNOSIS — I4819 Other persistent atrial fibrillation: Secondary | ICD-10-CM

## 2014-06-07 DIAGNOSIS — I4891 Unspecified atrial fibrillation: Secondary | ICD-10-CM | POA: Diagnosis not present

## 2014-06-07 DIAGNOSIS — I481 Persistent atrial fibrillation: Secondary | ICD-10-CM

## 2014-06-07 DIAGNOSIS — Z79899 Other long term (current) drug therapy: Secondary | ICD-10-CM

## 2014-06-07 DIAGNOSIS — Z5181 Encounter for therapeutic drug level monitoring: Secondary | ICD-10-CM

## 2014-06-07 LAB — POCT INR: INR: 1.9

## 2014-06-07 MED ORDER — WARFARIN SODIUM 5 MG PO TABS: ORAL_TABLET | ORAL | Status: DC

## 2014-06-07 NOTE — Progress Notes (Signed)
Primary Care Physician: Gara Kroner, MD Referring Physician:  Dr Irish Lack EP: Dr. Haydee Salter is a 80 y.o. female with a h/o persistent atrial fibrillation and bradycardia who presents today for EP follow-up following hospitalization for afib with rvr and a syncopal episode, receiving a PPM 12/16.  She was discharged in afib on flecainide 50 mg  and diltiazem 240 mg qd was added for rate control.   She is currently feeling well, no further syncopal episodes, but does feel weaker in afib and would like to be back in SR. Flecainide was increased to 100 mg bid, which did not convert pt so she was scheduled for DCCV.   Flecainide 66m mg bid  level normal range at 0.64.  Pre cardioversion labs abnormal with glucose significantly elevated, thought possible from cortisone shot to her knee days before.CV was cancelled but she was admitted to the hospital for regulation of blood sugar and when deemed stable ,she was successfully cardioverted prior to discharge.  Flecainide level on 100 mg bid was slightly elevated at 1.01 so dose was decreased to 75 mg bid and another level is pending.  Interrogation of device today shows no evidence of afib since CV. Pt is feeling improved with increase in energy but not quite at level prior to hospital.   Today, she denies symptoms of palpitations, chest pain, shortness of breath, orthopnea, PND, lower extremity edema, presyncope, syncope, or neurologic sequela.  .The patient is tolerating medications without difficulties and is otherwise without complaint today.   Past Medical History  Diagnosis Date  . Essential hypertension   . Gout   . GERD (gastroesophageal reflux disease)   . Insomnia   . Atrial fibrillation   . Type 2 diabetes mellitus   . Allergic rhinitis   . Hyperlipemia, mixed   . Chronic renal disease, stage IV    Past Surgical History  Procedure Laterality Date  . Cesarean section      x2  . Esophageal dilation    .  Permanent pacemaker insertion N/A 04/26/2014    Procedure: PERMANENT PACEMAKER INSERTION;  Surgeon: Evans Lance, MD;  Location: [facility] CATH LAB;  Service: Cardiovascular;  Laterality: N/A;  . Tee without cardioversion N/A 05/29/2014    Procedure: TRANSESOPHAGEAL ECHOCARDIOGRAM (TEE);  Surgeon: Fay Records, MD;  Location: [facility] ENDOSCOPY;  Service: Cardiovascular;  Laterality: N/A;  . Cardioversion N/A 05/29/2014    Procedure: CARDIOVERSION;  Surgeon: Fay Records, MD;  Location: [facility] ENDOSCOPY;  Service: Cardiovascular;  Laterality: N/A;    Current Outpatient Prescriptions  Medication Sig Dispense Refill  . allopurinol (ZYLOPRIM) 100 MG tablet Take 100 mg by mouth at bedtime.    Marland Kitchen atorvastatin (LIPITOR) 80 MG tablet Take 80 mg by mouth at bedtime.     . benazepril (LOTENSIN) 20 MG tablet Take 20 mg by mouth daily.    Marland Kitchen diltiazem (CARDIZEM CD) 240 MG 24 hr capsule Take 1 capsule (240 mg total) by mouth daily. 30 capsule 11  . flecainide (TAMBOCOR) 50 MG tablet Take 50 mg by mouth as directed. Take 1 and 1/2 tablet twice a day    . furosemide (LASIX) 40 MG tablet Take 40 mg by mouth daily.    Marland Kitchen KLOR-CON M20 20 MEQ tablet Take 20 mEq by mouth daily.  3  . levothyroxine (SYNTHROID, LEVOTHROID) 50 MCG tablet Take 50 mcg by mouth daily. On a empty stomach.    . linagliptin (TRADJENTA) 5 MG TABS tablet Take 5  mg by mouth daily.    . repaglinide (PRANDIN) 0.5 MG tablet Take 0.5 mg by mouth 2 (two) times daily before a meal.    . warfarin (COUMADIN) 5 MG tablet Take as directed by coumadin clinic 30 tablet 3   No current facility-administered medications for this visit.    Allergies  Allergen Reactions  . Cortisone Other (See Comments)    Hyperglycemia   . Prednisone Other (See Comments)    Hyperglycemia     History   Social History  . Marital Status: Married    Spouse Name: N/A    Number of Children: N/A  . Years of Education: N/A   Occupational History  . Not on file.   Social History  Main Topics  . Smoking status: Never Smoker   . Smokeless tobacco: Not on file  . Alcohol Use: No  . Drug Use: No  . Sexual Activity: Not on file   Other Topics Concern  . Not on file   Social History Narrative   Lives in [place].  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 Coronary artery disease Father 42   Physical Exam: Filed Vitals:   06/07/14 1628  BP: 120/64  Pulse: 73  Height: 5\' 3"  (1.6 m)  Weight: 160 lb 12.8 oz (72.938 kg)    GEN- The patient is well appearing, alert and oriented x 3 today.   Head- normocephalic, atraumatic Eyes-  Sclera clear, conjunctiva pink Ears- hearing intact Oropharynx- clear Neck- supple, no JVP Lymph- no cervical lymphadenopathy Lungs- Clear to ausculation bilaterally, normal work of breathing Heart-regular rate and rhythm, no murmurs, rubs or gallops, PMI not laterally displaced. Pacer site well healed. GI- soft, NT, ND, + BS Extremities- no clubbing, cyanosis, or edema   EKG today reveals paced rhythm at 73 bpm. QRS 96 ms. Interrogation reveals normal PPM, no afib since CV.     Assessment and Plan: 1. Afib with h/o symptomatic bradycardia/syncopy S/P PPM, no further syncope.  Pacer site healed.  2. Successful CV with no f/u evidence of afib. Continue warfarin and flecainide at 75 mg bid with level pending.  F/u as scheduled with  Dr. Lovena Le 3/22.

## 2014-06-07 NOTE — Patient Instructions (Signed)
Your physician recommends that you continue on your current medications as directed. Please refer to the Current Medication list given to you today. Your physician recommends that you keep your follow-up appointment in with Dr. Lovena Le as scheduled.

## 2014-06-08 ENCOUNTER — Other Ambulatory Visit: Payer: Self-pay

## 2014-06-08 DIAGNOSIS — R5383 Other fatigue: Secondary | ICD-10-CM | POA: Diagnosis not present

## 2014-06-08 DIAGNOSIS — E039 Hypothyroidism, unspecified: Secondary | ICD-10-CM | POA: Diagnosis not present

## 2014-06-08 DIAGNOSIS — I129 Hypertensive chronic kidney disease with stage 1 through stage 4 chronic kidney disease, or unspecified chronic kidney disease: Secondary | ICD-10-CM | POA: Diagnosis not present

## 2014-06-08 DIAGNOSIS — E1165 Type 2 diabetes mellitus with hyperglycemia: Secondary | ICD-10-CM | POA: Diagnosis not present

## 2014-06-08 DIAGNOSIS — E559 Vitamin D deficiency, unspecified: Secondary | ICD-10-CM | POA: Diagnosis not present

## 2014-06-08 DIAGNOSIS — I4891 Unspecified atrial fibrillation: Secondary | ICD-10-CM | POA: Diagnosis not present

## 2014-06-08 DIAGNOSIS — N189 Chronic kidney disease, unspecified: Secondary | ICD-10-CM | POA: Diagnosis not present

## 2014-06-08 MED ORDER — DILTIAZEM HCL ER COATED BEADS 240 MG PO CP24: 240.0000 mg | ORAL_CAPSULE | Freq: Every day | ORAL | Status: DC

## 2014-06-14 ENCOUNTER — Encounter: Payer: Self-pay | Admitting: Internal Medicine

## 2014-06-14 LAB — FLECAINIDE LEVEL: FLECAINIDE: 0.79 ug/mL (ref 0.20–1.00)

## 2014-06-21 ENCOUNTER — Ambulatory Visit (INDEPENDENT_AMBULATORY_CARE_PROVIDER_SITE_OTHER): Payer: Medicare Other | Admitting: *Deleted

## 2014-06-21 DIAGNOSIS — Z5181 Encounter for therapeutic drug level monitoring: Secondary | ICD-10-CM

## 2014-06-21 DIAGNOSIS — I4891 Unspecified atrial fibrillation: Secondary | ICD-10-CM

## 2014-06-21 LAB — POCT INR: INR: 1.7

## 2014-06-29 DIAGNOSIS — I4891 Unspecified atrial fibrillation: Secondary | ICD-10-CM | POA: Diagnosis not present

## 2014-06-29 DIAGNOSIS — N184 Chronic kidney disease, stage 4 (severe): Secondary | ICD-10-CM | POA: Diagnosis not present

## 2014-06-29 DIAGNOSIS — R609 Edema, unspecified: Secondary | ICD-10-CM | POA: Diagnosis not present

## 2014-06-29 DIAGNOSIS — R5381 Other malaise: Secondary | ICD-10-CM | POA: Diagnosis not present

## 2014-06-29 DIAGNOSIS — I129 Hypertensive chronic kidney disease with stage 1 through stage 4 chronic kidney disease, or unspecified chronic kidney disease: Secondary | ICD-10-CM | POA: Diagnosis not present

## 2014-06-29 DIAGNOSIS — E559 Vitamin D deficiency, unspecified: Secondary | ICD-10-CM | POA: Diagnosis not present

## 2014-06-29 DIAGNOSIS — M109 Gout, unspecified: Secondary | ICD-10-CM | POA: Diagnosis not present

## 2014-06-29 DIAGNOSIS — E039 Hypothyroidism, unspecified: Secondary | ICD-10-CM | POA: Diagnosis not present

## 2014-06-29 DIAGNOSIS — E785 Hyperlipidemia, unspecified: Secondary | ICD-10-CM | POA: Diagnosis not present

## 2014-06-29 DIAGNOSIS — E1122 Type 2 diabetes mellitus with diabetic chronic kidney disease: Secondary | ICD-10-CM | POA: Diagnosis not present

## 2014-07-05 ENCOUNTER — Ambulatory Visit (INDEPENDENT_AMBULATORY_CARE_PROVIDER_SITE_OTHER): Payer: Medicare Other | Admitting: *Deleted

## 2014-07-05 DIAGNOSIS — Z5181 Encounter for therapeutic drug level monitoring: Secondary | ICD-10-CM | POA: Diagnosis not present

## 2014-07-05 DIAGNOSIS — I4891 Unspecified atrial fibrillation: Secondary | ICD-10-CM | POA: Diagnosis not present

## 2014-07-05 LAB — POCT INR: INR: 2.5

## 2014-07-06 ENCOUNTER — Other Ambulatory Visit: Payer: Self-pay

## 2014-07-06 DIAGNOSIS — Z1231 Encounter for screening mammogram for malignant neoplasm of breast: Secondary | ICD-10-CM

## 2014-08-01 ENCOUNTER — Ambulatory Visit (INDEPENDENT_AMBULATORY_CARE_PROVIDER_SITE_OTHER): Payer: Medicare Other | Admitting: Internal Medicine

## 2014-08-01 ENCOUNTER — Ambulatory Visit (INDEPENDENT_AMBULATORY_CARE_PROVIDER_SITE_OTHER): Payer: Medicare Other | Admitting: *Deleted

## 2014-08-01 ENCOUNTER — Encounter: Payer: Self-pay | Admitting: Internal Medicine

## 2014-08-01 VITALS — BP 128/62 | HR 66 | Ht 63.0 in | Wt 158.5 lb

## 2014-08-01 DIAGNOSIS — Z5181 Encounter for therapeutic drug level monitoring: Secondary | ICD-10-CM

## 2014-08-01 DIAGNOSIS — I4891 Unspecified atrial fibrillation: Secondary | ICD-10-CM

## 2014-08-01 DIAGNOSIS — I10 Essential (primary) hypertension: Secondary | ICD-10-CM

## 2014-08-01 DIAGNOSIS — I480 Paroxysmal atrial fibrillation: Secondary | ICD-10-CM | POA: Diagnosis not present

## 2014-08-01 DIAGNOSIS — Z950 Presence of cardiac pacemaker: Secondary | ICD-10-CM | POA: Insufficient documentation

## 2014-08-01 DIAGNOSIS — R001 Bradycardia, unspecified: Secondary | ICD-10-CM

## 2014-08-01 LAB — MDC_IDC_ENUM_SESS_TYPE_INCLINIC
Brady Statistic AP VS Percent: 99.47 %
Brady Statistic RA Percent Paced: 99.82 %
Brady Statistic RV Percent Paced: 0.35 %
Date Time Interrogation Session: 20160322123527
Lead Channel Impedance Value: 342 Ohm
Lead Channel Impedance Value: 456 Ohm
Lead Channel Impedance Value: 456 Ohm
Lead Channel Pacing Threshold Amplitude: 1.25 V
Lead Channel Pacing Threshold Pulse Width: 0.4 ms
Lead Channel Pacing Threshold Pulse Width: 0.4 ms
Lead Channel Setting Pacing Amplitude: 2.5 V
MDC IDC MSMT BATTERY REMAINING LONGEVITY: 127 mo
MDC IDC MSMT BATTERY VOLTAGE: 3.04 V
MDC IDC MSMT LEADCHNL RV IMPEDANCE VALUE: 513 Ohm
MDC IDC MSMT LEADCHNL RV PACING THRESHOLD AMPLITUDE: 1 V
MDC IDC MSMT LEADCHNL RV SENSING INTR AMPL: 17.875 mV
MDC IDC SET LEADCHNL RA PACING AMPLITUDE: 2.5 V
MDC IDC SET LEADCHNL RV PACING PULSEWIDTH: 0.4 ms
MDC IDC SET LEADCHNL RV SENSING SENSITIVITY: 0.9 mV
MDC IDC STAT BRADY AP VP PERCENT: 0.34 %
MDC IDC STAT BRADY AS VP PERCENT: 0.01 %
MDC IDC STAT BRADY AS VS PERCENT: 0.18 %
Zone Setting Detection Interval: 350 ms
Zone Setting Detection Interval: 400 ms

## 2014-08-01 LAB — POCT INR: INR: 1.9

## 2014-08-01 NOTE — Assessment & Plan Note (Signed)
She is maintaining NSR. She will continue her flecainide and AV nodal blocking drugs.

## 2014-08-01 NOTE — Assessment & Plan Note (Signed)
Her blood pressure is well controlled. No change in meds. 

## 2014-08-01 NOTE — Patient Instructions (Signed)
Remote monitoring is used to monitor your Pacemaker or ICD from home. This monitoring reduces the number of office visits required to check your device to one time per year. It allows [place] to keep an eye on the functioning of your device to ensure it is working properly. You are scheduled for a device check from home on 10/31/2014. You may send your transmission at any time that day. If you have a wireless device, the transmission will be sent automatically. After your physician reviews your transmission, you will receive a postcard with your next transmission date.  Your physician wants you to follow-up in: 9 months with Dr. Lovena Le. You will receive a reminder letter in the mail two months in advance. If you don't receive a letter, please call our office to schedule the follow-up appointment.

## 2014-08-01 NOTE — Assessment & Plan Note (Signed)
Her Medtronic DDD PM is working normally. Will recheck in several months. 

## 2014-08-01 NOTE — Progress Notes (Signed)
HPI Kathryn Richardson returns today, 3 months after PPM insertion for symptomatic bradycardia due to sinus node dysfunction. She has PAF In the interim, she had a DCCV and continues to do well with no chest pain or sob. She is considering knee replacement surgery. Allergies  Allergen Reactions  . Cortisone Other (See Comments)    Hyperglycemia   . Prednisone Other (See Comments)    Hyperglycemia      Current Outpatient Prescriptions  Medication Sig Dispense Refill  . allopurinol (ZYLOPRIM) 100 MG tablet Take 100 mg by mouth at bedtime.    Marland Kitchen atorvastatin (LIPITOR) 80 MG tablet Take 80 mg by mouth at bedtime.     . benazepril (LOTENSIN) 20 MG tablet Take 20 mg by mouth daily.    Marland Kitchen diltiazem (CARDIZEM CD) 240 MG 24 hr capsule Take 1 capsule (240 mg total) by mouth daily. 90 capsule 2  . flecainide (TAMBOCOR) 50 MG tablet Take 50 mg by mouth as directed. Take 1 and 1/2 tablet twice a day    . furosemide (LASIX) 40 MG tablet Take 40 mg by mouth daily.    Marland Kitchen KLOR-CON M20 20 MEQ tablet Take 20 mEq by mouth daily.  3  . levothyroxine (SYNTHROID, LEVOTHROID) 50 MCG tablet Take 50 mcg by mouth daily. On a empty stomach.    . linagliptin (TRADJENTA) 5 MG TABS tablet Take 5 mg by mouth daily.    . repaglinide (PRANDIN) 0.5 MG tablet Take 0.5 mg by mouth 2 (two) times daily before a meal.    . warfarin (COUMADIN) 5 MG tablet Take as directed by coumadin clinic 30 tablet 3   No current facility-administered medications for this visit.     Past Medical History  Diagnosis Date  . Essential hypertension   . Gout   . GERD (gastroesophageal reflux disease)   . Insomnia   . Atrial fibrillation   . Type 2 diabetes mellitus   . Allergic rhinitis   . Hyperlipemia, mixed   . Chronic renal disease, stage IV     ROS:   All systems reviewed and negative except as noted in the HPI.   Past Surgical History  Procedure Laterality Date  . Cesarean section      x2  . Esophageal dilation    .  Permanent pacemaker insertion N/A 04/26/2014    Procedure: PERMANENT PACEMAKER INSERTION;  Surgeon: Evans Lance, MD;  Location: [facility] CATH LAB;  Service: Cardiovascular;  Laterality: N/A;  . Tee without cardioversion N/A 05/29/2014    Procedure: TRANSESOPHAGEAL ECHOCARDIOGRAM (TEE);  Surgeon: Fay Records, MD;  Location: [facility] ENDOSCOPY;  Service: Cardiovascular;  Laterality: N/A;  . Cardioversion N/A 05/29/2014    Procedure: CARDIOVERSION;  Surgeon: Fay Records, MD;  Location: [facility] ENDOSCOPY;  Service: Cardiovascular;  Laterality: N/A;     Family History  Problem Relation Age of Onset  . Coronary artery disease Father 47     History   Social History  . Marital Status: Married    Spouse Name: N/A  . Number of Children: N/A  . Years of Education: N/A   Occupational History  . Not on file.   Social History Main Topics  . Smoking status: Never Smoker   . Smokeless tobacco: Not on file  . Alcohol Use: No  . Drug Use: No  . Sexual Activity: Not on file   Other Topics Concern  . Not on file   Social History Narrative   Lives in [place].  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  Physical Exam:  Well appearing 80 yo woman, NAD HEENT: Unremarkable Neck:  No JVD, no thyromegally Lymphatics:  No adenopathy Back:  No CVA tenderness Lungs:  Clear with no wheezes, rales, or rhonchi HEART:  Regular rate rhythm, no murmurs, no rubs, no clicks Abd:  soft, positive bowel sounds, no organomegally, no rebound, no guarding Ext:  2 plus pulses, no edema, no cyanosis, no clubbing Skin:  No rashes no nodules Neuro:  CN II through XII intact, motor grossly intact   DEVICE  Normal device function.  See PaceArt for details.   Assess/Plan:

## 2014-08-08 DIAGNOSIS — R609 Edema, unspecified: Secondary | ICD-10-CM | POA: Diagnosis not present

## 2014-08-08 DIAGNOSIS — E559 Vitamin D deficiency, unspecified: Secondary | ICD-10-CM | POA: Diagnosis not present

## 2014-08-08 DIAGNOSIS — I4891 Unspecified atrial fibrillation: Secondary | ICD-10-CM | POA: Diagnosis not present

## 2014-08-08 DIAGNOSIS — E785 Hyperlipidemia, unspecified: Secondary | ICD-10-CM | POA: Diagnosis not present

## 2014-08-08 DIAGNOSIS — M109 Gout, unspecified: Secondary | ICD-10-CM | POA: Diagnosis not present

## 2014-08-08 DIAGNOSIS — E039 Hypothyroidism, unspecified: Secondary | ICD-10-CM | POA: Diagnosis not present

## 2014-08-08 DIAGNOSIS — N184 Chronic kidney disease, stage 4 (severe): Secondary | ICD-10-CM | POA: Diagnosis not present

## 2014-08-08 DIAGNOSIS — E1122 Type 2 diabetes mellitus with diabetic chronic kidney disease: Secondary | ICD-10-CM | POA: Diagnosis not present

## 2014-08-08 DIAGNOSIS — I129 Hypertensive chronic kidney disease with stage 1 through stage 4 chronic kidney disease, or unspecified chronic kidney disease: Secondary | ICD-10-CM | POA: Diagnosis not present

## 2014-08-09 ENCOUNTER — Encounter: Payer: Self-pay | Admitting: Interventional Cardiology

## 2014-08-11 ENCOUNTER — Encounter: Payer: Self-pay | Admitting: Internal Medicine

## 2014-08-15 ENCOUNTER — Ambulatory Visit
Admission: RE | Admit: 2014-08-15 | Discharge: 2014-08-15 | Disposition: A | Discharge status: Home / Self Care | Payer: Medicare Other | Source: Ambulatory Visit

## 2014-08-15 ENCOUNTER — Ambulatory Visit (INDEPENDENT_AMBULATORY_CARE_PROVIDER_SITE_OTHER): Payer: Medicare Other | Admitting: *Deleted

## 2014-08-15 DIAGNOSIS — Z5181 Encounter for therapeutic drug level monitoring: Secondary | ICD-10-CM

## 2014-08-15 DIAGNOSIS — I4891 Unspecified atrial fibrillation: Secondary | ICD-10-CM

## 2014-08-15 DIAGNOSIS — Z1231 Encounter for screening mammogram for malignant neoplasm of breast: Secondary | ICD-10-CM

## 2014-08-15 LAB — POCT INR: INR: 3.4

## 2014-08-29 ENCOUNTER — Ambulatory Visit (INDEPENDENT_AMBULATORY_CARE_PROVIDER_SITE_OTHER): Payer: Medicare Other | Admitting: Pharmacist Clinician (PhC)/ Clinical Pharmacy Specialist

## 2014-08-29 DIAGNOSIS — I4891 Unspecified atrial fibrillation: Secondary | ICD-10-CM

## 2014-08-29 DIAGNOSIS — Z5181 Encounter for therapeutic drug level monitoring: Secondary | ICD-10-CM | POA: Diagnosis not present

## 2014-08-29 LAB — POCT INR: INR: 2.2

## 2014-09-08 ENCOUNTER — Other Ambulatory Visit: Payer: Self-pay | Admitting: Interventional Cardiology

## 2014-09-11 DIAGNOSIS — N183 Chronic kidney disease, stage 3 (moderate): Secondary | ICD-10-CM | POA: Diagnosis not present

## 2014-09-11 DIAGNOSIS — D631 Anemia in chronic kidney disease: Secondary | ICD-10-CM | POA: Diagnosis not present

## 2014-09-11 DIAGNOSIS — G4700 Insomnia, unspecified: Secondary | ICD-10-CM | POA: Diagnosis not present

## 2014-09-11 DIAGNOSIS — I129 Hypertensive chronic kidney disease with stage 1 through stage 4 chronic kidney disease, or unspecified chronic kidney disease: Secondary | ICD-10-CM | POA: Diagnosis not present

## 2014-09-11 DIAGNOSIS — R112 Nausea with vomiting, unspecified: Secondary | ICD-10-CM | POA: Diagnosis not present

## 2014-09-11 DIAGNOSIS — Z7901 Long term (current) use of anticoagulants: Secondary | ICD-10-CM | POA: Diagnosis not present

## 2014-09-11 DIAGNOSIS — Z79899 Other long term (current) drug therapy: Secondary | ICD-10-CM | POA: Diagnosis not present

## 2014-09-11 DIAGNOSIS — E785 Hyperlipidemia, unspecified: Secondary | ICD-10-CM | POA: Diagnosis not present

## 2014-09-11 DIAGNOSIS — M109 Gout, unspecified: Secondary | ICD-10-CM | POA: Diagnosis not present

## 2014-09-11 DIAGNOSIS — E039 Hypothyroidism, unspecified: Secondary | ICD-10-CM | POA: Diagnosis not present

## 2014-09-11 DIAGNOSIS — I4891 Unspecified atrial fibrillation: Secondary | ICD-10-CM | POA: Diagnosis not present

## 2014-09-19 ENCOUNTER — Ambulatory Visit (INDEPENDENT_AMBULATORY_CARE_PROVIDER_SITE_OTHER): Payer: Medicare Other

## 2014-09-19 DIAGNOSIS — I4891 Unspecified atrial fibrillation: Secondary | ICD-10-CM | POA: Diagnosis not present

## 2014-09-19 DIAGNOSIS — Z5181 Encounter for therapeutic drug level monitoring: Secondary | ICD-10-CM

## 2014-09-19 DIAGNOSIS — E119 Type 2 diabetes mellitus without complications: Secondary | ICD-10-CM | POA: Diagnosis not present

## 2014-09-19 LAB — POCT INR: INR: 2.1

## 2014-10-05 ENCOUNTER — Encounter: Payer: Self-pay | Admitting: Interventional Cardiology

## 2014-10-05 ENCOUNTER — Ambulatory Visit (INDEPENDENT_AMBULATORY_CARE_PROVIDER_SITE_OTHER): Payer: Medicare Other | Admitting: Interventional Cardiology

## 2014-10-05 VITALS — BP 130/64 | HR 62 | Ht 63.0 in | Wt 159.8 lb

## 2014-10-05 DIAGNOSIS — Z950 Presence of cardiac pacemaker: Secondary | ICD-10-CM

## 2014-10-05 DIAGNOSIS — I10 Essential (primary) hypertension: Secondary | ICD-10-CM

## 2014-10-05 DIAGNOSIS — E782 Mixed hyperlipidemia: Secondary | ICD-10-CM

## 2014-10-05 DIAGNOSIS — I4891 Unspecified atrial fibrillation: Secondary | ICD-10-CM | POA: Diagnosis not present

## 2014-10-05 NOTE — Progress Notes (Signed)
Patient ID: Kathryn Richardson, female   DOB: 1935-02-10, 80 y.o.   MRN: [id number]     Cardiology Office Note   Date:  10/05/2014   ID:  Kathryn Richardson, DOB 14-Jul-1934, MRN [id number]  PCP:  Gara Kroner, MD    No chief complaint on file. AFib   Wt Readings from Last 3 Encounters:  10/05/14 159 lb 12.8 oz (72.485 kg)  08/01/14 158 lb 8 oz (71.895 kg)  06/07/14 160 lb 12.8 oz (72.938 kg)       History of Present Illness: Kathryn Richardson is a 80 y.o. female  With AFib, tachybrady who had syncope, which may have been related to 1-1 atrial flutter. She has had issues with bradycardia as well in the past. When her medications were decreased due to bradycardia, her atrial fibrillation with come back. Limited increased medications, their be more bradycardia. She has also had renal insufficiency. This has limited antiarrhythmics choice.   After her syncopal episode, She eventually had a pacemaker placed. She has had a heart cath in the past which was negative for any significant CAD.  Since pacemaker implantation, she has done well. She denies any palpitations. She has not had any syncope. Her renal function has improved. She is walking regularly.    Past Medical History  Diagnosis Date  . Essential hypertension   . Gout   . GERD (gastroesophageal reflux disease)   . Insomnia   . Atrial fibrillation   . Type 2 diabetes mellitus   . Allergic rhinitis   . Hyperlipemia, mixed   . Chronic renal disease, stage IV     Past Surgical History  Procedure Laterality Date  . Cesarean section      x2  . Esophageal dilation    . Permanent pacemaker insertion N/A 04/26/2014    Procedure: PERMANENT PACEMAKER INSERTION;  Surgeon: Evans Lance, MD;  [facility] LAB;  Service: Cardiovascular;  Laterality: N/A;  . Tee without cardioversion N/A 05/29/2014    Procedure: TRANSESOPHAGEAL ECHOCARDIOGRAM (TEE);  Surgeon: Fay Records, MD;  Location: [facility] ENDOSCOPY;  Service: Cardiovascular;   Laterality: N/A;  . Cardioversion N/A 05/29/2014    Procedure: CARDIOVERSION;  Surgeon: Fay Records, MD;  Location: [facility] ENDOSCOPY;  Service: Cardiovascular;  Laterality: N/A;     Current Outpatient Prescriptions  Medication Sig Dispense Refill  . allopurinol (ZYLOPRIM) 100 MG tablet Take 100 mg by mouth at bedtime.    Marland Kitchen atorvastatin (LIPITOR) 80 MG tablet Take 80 mg by mouth at bedtime.     . benazepril (LOTENSIN) 20 MG tablet Take 20 mg by mouth daily.    Marland Kitchen diltiazem (CARDIZEM CD) 240 MG 24 hr capsule Take 1 capsule (240 mg total) by mouth daily. 90 capsule 2  . flecainide (TAMBOCOR) 50 MG tablet TAKE 1.5 TABLET ( 75 MG TOTAL) BY MOUTH EVERY 12 HOURS    . furosemide (LASIX) 40 MG tablet Take 40 mg by mouth daily.    Marland Kitchen JANUVIA 25 MG tablet Take 25 mg by mouth daily.  5  . KLOR-CON M20 20 MEQ tablet Take 20 mEq by mouth daily.  3  . levothyroxine (SYNTHROID, LEVOTHROID) 50 MCG tablet Take 50 mcg by mouth daily. On a empty stomach.    . repaglinide (PRANDIN) 0.5 MG tablet Take 0.5 mg by mouth 2 (two) times daily before a meal.    . warfarin (COUMADIN) 5 MG tablet Take as directed by coumadin clinic 30 tablet 3   No current facility-administered  medications for this visit.    Allergies:   Cortisone and Prednisone    Social History:  The patient  reports that she has never smoked. She has never used smokeless tobacco. She reports that she does not drink alcohol or use illicit drugs.   Family History:  The patient's family history includes Alzheimer's disease in her mother; Congestive Heart Failure in her mother; Coronary artery disease (age of onset: 26) in her father; Heart attack in her father.    ROS:  Please see the history of present illness.   Otherwise, review of systems are positive for pacemaker placement in 12/15.   All other systems are reviewed and negative.    PHYSICAL EXAM: VS:  BP 130/64 mmHg  Pulse 62  Ht 5\' 3"  (1.6 m)  Wt 159 lb 12.8 oz (72.485 kg)  BMI 28.31 kg/m2   SpO2 98% , BMI Body mass index is 28.31 kg/(m^2). GEN: Well nourished, well developed, in no acute distress HEENT: normal Neck: no JVD, carotid bruits, or masses Cardiac: RRR; no murmurs, rubs, or gallops,no edema  Respiratory:  clear to auscultation bilaterally, normal work of breathing GI: soft, nontender, nondistended, + BS MS: no deformity or atrophy Skin: warm and dry, no rash Neuro:  Strength and sensation are intact Psych: euthymic mood, full affect     Recent Labs: 04/25/2014: Pro B Natriuretic peptide (BNP) 4716.0*; TSH 1.230 05/25/2014: ALT 26 05/29/2014: BUN 30*; Creatinine 1.59*; Hemoglobin 13.3; Platelets 162; Potassium 4.0; Sodium 136   Lipid Panel No results found for: CHOL, TRIG, HDL, CHOLHDL, VLDL, LDLCALC, LDLDIRECT   Other studies Reviewed: Additional studies/ records that were reviewed today with results demonstrating: echo showed moderately decreased LV function   ASSESSMENT AND PLAN:  1. Atrial fibrillation: Maintaining sinus rhythm. Continue current medicines. 2. Hypertension: Blood pressure control. Continue current medicines. 3. Hyperlipidemia: Followed by Dr. Moreen Fowler. Continue atorvastatin.   Current medicines are reviewed at length with the patient today.  The patient concerns regarding her medicines were addressed.  The following changes have been made:  No change  Labs/ tests ordered today include:  No orders of the defined types were placed in this encounter.    Recommend 150 minutes/week of aerobic exercise Low fat, low carb, high fiber diet recommended  Disposition:   FU when necessary with me.  She will follow up regularly in the pacemaker clinic. She would prefer this to try to minimize the number of times she has to come to the office.   Teresita Madura., MD  10/05/2014 12:24 PM    [facility] [place], [place], [place]  [address] Phone: [phone]; Fax: [phone]

## 2014-10-05 NOTE — Patient Instructions (Signed)
Medication Instructions:  Same-no change  Labwork: None  Testing/Procedures: None  Follow-Up: Your physician recommends that you schedule a follow-up appointment in: as needed

## 2014-10-31 ENCOUNTER — Ambulatory Visit (HOSPITAL_COMMUNITY)
Admission: RE | Admit: 2014-10-31 | Discharge: 2014-10-31 | Disposition: A | Discharge status: Home / Self Care | Payer: Medicare Other | Source: Ambulatory Visit | Attending: Nurse Practitioner | Admitting: Nurse Practitioner

## 2014-10-31 ENCOUNTER — Telehealth: Payer: Self-pay | Admitting: Internal Medicine

## 2014-10-31 ENCOUNTER — Ambulatory Visit (INDEPENDENT_AMBULATORY_CARE_PROVIDER_SITE_OTHER): Payer: Medicare Other | Admitting: *Deleted

## 2014-10-31 ENCOUNTER — Other Ambulatory Visit (HOSPITAL_COMMUNITY): Payer: Self-pay | Admitting: *Deleted

## 2014-10-31 ENCOUNTER — Telehealth: Payer: Self-pay | Admitting: Cardiology

## 2014-10-31 ENCOUNTER — Ambulatory Visit (INDEPENDENT_AMBULATORY_CARE_PROVIDER_SITE_OTHER): Payer: Medicare Other | Admitting: Cardiovascular Disease

## 2014-10-31 ENCOUNTER — Other Ambulatory Visit: Payer: Self-pay

## 2014-10-31 ENCOUNTER — Encounter (HOSPITAL_COMMUNITY): Payer: Self-pay | Admitting: Nurse Practitioner

## 2014-10-31 VITALS — BP 138/86 | HR 91 | Ht 63.0 in | Wt 162.2 lb

## 2014-10-31 DIAGNOSIS — I4819 Other persistent atrial fibrillation: Secondary | ICD-10-CM

## 2014-10-31 DIAGNOSIS — I481 Persistent atrial fibrillation: Secondary | ICD-10-CM

## 2014-10-31 DIAGNOSIS — R001 Bradycardia, unspecified: Secondary | ICD-10-CM

## 2014-10-31 DIAGNOSIS — Z5181 Encounter for therapeutic drug level monitoring: Secondary | ICD-10-CM

## 2014-10-31 LAB — BASIC METABOLIC PANEL
Anion gap: 7 (ref 5–15)
BUN: 29 mg/dL — ABNORMAL HIGH (ref 6–20)
CALCIUM: 9.5 mg/dL (ref 8.9–10.3)
CO2: 26 mmol/L (ref 22–32)
CREATININE: 1.61 mg/dL — AB (ref 0.44–1.00)
Chloride: 104 mmol/L (ref 101–111)
GFR calc Af Amer: 34 mL/min — ABNORMAL LOW (ref >60)
GFR calc non Af Amer: 30 mL/min — ABNORMAL LOW (ref >60)
GLUCOSE: 252 mg/dL — AB (ref 65–99)
Potassium: 4.4 mmol/L (ref 3.5–5.1)
Sodium: 137 mmol/L (ref 135–145)

## 2014-10-31 LAB — CBC
HCT: 40.4 % (ref 36.0–46.0)
HEMOGLOBIN: 13.8 g/dL (ref 12.0–15.0)
MCH: 31.9 pg (ref 26.0–34.0)
MCHC: 34.2 g/dL (ref 30.0–36.0)
MCV: 93.5 fL (ref 78.0–100.0)
PLATELETS: 201 10*3/uL (ref 150–400)
RBC: 4.32 MIL/uL (ref 3.87–5.11)
RDW: 14 % (ref 11.5–15.5)
WBC: 7.5 10*3/uL (ref 4.0–10.5)

## 2014-10-31 LAB — PROTIME-INR
INR: 2.27 — ABNORMAL HIGH (ref 0.00–1.49)
Prothrombin Time: 24.8 seconds — ABNORMAL HIGH (ref 11.6–15.2)

## 2014-10-31 NOTE — Telephone Encounter (Signed)
New Message       Pt calling stating she has been in A-fib since Thursday. Pt wants to know if she should be seen or what she should be doing. Please call back and advise.

## 2014-10-31 NOTE — Progress Notes (Signed)
Patient ID: Kathryn Richardson, female   DOB: [birth year], 80 y.o.   MRN: [id number]      Primary Care Physician: Gara Kroner, MD Referring Physician:  Dr Irish Lack EP: Dr. Haydee Salter is a 80 y.o. female with a h/o persistent atrial fibrillation and bradycardia  receiving a PPM 12/16 for syncope, thought maybe caused from 1:1 aflutter.  She was discharged in afib on flecainide 50 mg  and diltiazem 240 mg qd was added for rate control.  However she remained in afib. Flecainide was increased to 100 mg bid without converting pt to SR and level of flecainide was  high on this dose, so she was reduced to 75 mg bid. She presented for cardioversion in January 2016 but DCCV was cancelled due to hyperglycemia and she was hospitalized. DCCV was successfully performed later after blood sugar stabilized. She has maintained SR until just recently.  She asked to be seen in the afib clinic today due to being in afib since last Thursday. She has been compliant with flecainide and coumadin. She has been out of town in [place] attending the funeral of her 64 year old mother in law and thinks maybe the stress of this caused her to slip into afib. She is mildly symptomatic with fatigue and dyspnea.  Today, she denies symptoms of  chest pain , orthopnea, PND, lower extremity edema, presyncope, syncope, or neurologic sequela.  .The patient is tolerating medications without difficulties and is otherwise without complaint today.   Past Medical History  Diagnosis Date  . Essential hypertension   . Gout   . GERD (gastroesophageal reflux disease)   . Insomnia   . Atrial fibrillation   . Type 2 diabetes mellitus   . Allergic rhinitis   . Hyperlipemia, mixed   . Chronic renal disease, stage IV    Past Surgical History  Procedure Laterality Date  . Cesarean section      x2  . Esophageal dilation    . Permanent pacemaker insertion N/A 04/26/2014    Procedure: PERMANENT PACEMAKER INSERTION;  Surgeon: Evans Lance, MD;  Location: [facility] CATH LAB;  Service: Cardiovascular;  Laterality: N/A;  . Tee without cardioversion N/A 05/29/2014    Procedure: TRANSESOPHAGEAL ECHOCARDIOGRAM (TEE);  Surgeon: Fay Records, MD;  Location: [facility] ENDOSCOPY;  Service: Cardiovascular;  Laterality: N/A;  . Cardioversion N/A 05/29/2014    Procedure: CARDIOVERSION;  Surgeon: Fay Records, MD;  Location: [facility] ENDOSCOPY;  Service: Cardiovascular;  Laterality: N/A;    Current Outpatient Prescriptions  Medication Sig Dispense Refill  . allopurinol (ZYLOPRIM) 100 MG tablet Take 100 mg by mouth at bedtime.    Marland Kitchen atorvastatin (LIPITOR) 80 MG tablet Take 80 mg by mouth at bedtime.     . benazepril (LOTENSIN) 20 MG tablet Take 20 mg by mouth daily.    Marland Kitchen diltiazem (CARDIZEM CD) 240 MG 24 hr capsule Take 1 capsule (240 mg total) by mouth daily. 90 capsule 2  . flecainide (TAMBOCOR) 50 MG tablet TAKE 1.5 TABLET ( 75 MG TOTAL) BY MOUTH EVERY 12 HOURS    . furosemide (LASIX) 40 MG tablet Take 40 mg by mouth daily.    Marland Kitchen JANUVIA 25 MG tablet Take 25 mg by mouth daily.  5  . KLOR-CON M20 20 MEQ tablet Take 20 mEq by mouth daily.  3  . levothyroxine (SYNTHROID, LEVOTHROID) 50 MCG tablet Take 50 mcg by mouth daily. On a empty stomach.    . repaglinide (PRANDIN) 0.5  MG tablet Take 0.5 mg by mouth 2 (two) times daily before a meal.    . warfarin (COUMADIN) 5 MG tablet Take as directed by coumadin clinic 30 tablet 3   No current facility-administered medications for this encounter.    Allergies  Allergen Reactions  . Cortisone Other (See Comments)    Hyperglycemia   . Prednisone Other (See Comments)    Hyperglycemia     History   Social History  . Marital Status: Married    Spouse Name: N/A  . Number of Children: N/A  . Years of Education: N/A   Occupational History  . Not on file.   Social History Main Topics  . Smoking status: Never Smoker   . Smokeless tobacco: Never Used  . Alcohol Use: No  . Drug Use: No  . Sexual Activity:  Not on file   Other Topics Concern  . Not on file   Social History Narrative   Lives in [place].  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 Coronary artery disease Father 26  . Heart attack Father   . Congestive Heart Failure Mother   . Alzheimer's disease Mother    Physical Exam: Filed Vitals:   10/31/14 1146  BP: 138/86  Pulse: 91  Height: 5\' 3"  (1.6 m)  Weight: 162 lb 3.2 oz (73.573 kg)    GEN- The patient is well appearing, alert and oriented x 3 today.   Head- normocephalic, atraumatic Eyes-  Sclera clear, conjunctiva pink Ears- hearing intact Oropharynx- clear Neck- supple, no JVP Lymph- no cervical lymphadenopathy Lungs- Clear to ausculation bilaterally, normal work of breathing Heart-irregular rate and rhythm, no murmurs, rubs or gallops, PMI not laterally displaced. Pacer site well healed. GI- soft, NT, ND, + BS Extremities- no clubbing, cyanosis, or edema   EKG today reveals afib at 91 bpm, with paced ventricular complexes, with ST/T wave abnormalities. INR- MAY- 2.1, April 19th -2.2, April 5th- 3.4  Assessment and Plan:  1.Symptomatic Persistent afib    Will be scheduled for cardioversion Pre procedure labs to be drawn including INR Pt has been therapeutic reviewing labs back thru April. States compliance with coumadin  F/u with afib clinic post DCCV

## 2014-10-31 NOTE — Patient Instructions (Addendum)
Cardioversion is scheduled for Friday, June 24th  - Arrive at the [facility] and go to admitting @ 7am  - Do not eat or drink anything after midnight the night before you procedure.  - Take your medication (EXCEPT your diabetes medication) with a sip of water prior to arrival.  - You will not be able to drive home after your procedure.  Parking code for follow up appointment is [id number]

## 2014-10-31 NOTE — Telephone Encounter (Signed)
LMOVM reminding pt to send remote transmission.   

## 2014-10-31 NOTE — Telephone Encounter (Signed)
Talked with patient - wants to be checked out and given more information.  Appointment made for 1130 today.

## 2014-11-01 ENCOUNTER — Encounter: Payer: Self-pay | Admitting: Cardiology

## 2014-11-03 ENCOUNTER — Ambulatory Visit (HOSPITAL_COMMUNITY)
Admission: RE | Admit: 2014-11-03 | Discharge: 2014-11-03 | Disposition: A | Discharge status: Home / Self Care | Payer: Medicare Other | Source: Ambulatory Visit | Attending: Cardiovascular Disease | Admitting: Cardiovascular Disease

## 2014-11-03 ENCOUNTER — Ambulatory Visit (HOSPITAL_COMMUNITY): Payer: Medicare Other | Admitting: Anesthesiology

## 2014-11-03 ENCOUNTER — Encounter (HOSPITAL_COMMUNITY)
Admission: RE | Disposition: A | Discharge status: Home / Self Care | Payer: Self-pay | Source: Ambulatory Visit | Attending: Cardiovascular Disease

## 2014-11-03 ENCOUNTER — Encounter (HOSPITAL_COMMUNITY): Payer: Self-pay

## 2014-11-03 DIAGNOSIS — I129 Hypertensive chronic kidney disease with stage 1 through stage 4 chronic kidney disease, or unspecified chronic kidney disease: Secondary | ICD-10-CM | POA: Diagnosis not present

## 2014-11-03 DIAGNOSIS — Z950 Presence of cardiac pacemaker: Secondary | ICD-10-CM | POA: Diagnosis not present

## 2014-11-03 DIAGNOSIS — Z79899 Other long term (current) drug therapy: Secondary | ICD-10-CM | POA: Diagnosis not present

## 2014-11-03 DIAGNOSIS — K219 Gastro-esophageal reflux disease without esophagitis: Secondary | ICD-10-CM | POA: Diagnosis not present

## 2014-11-03 DIAGNOSIS — E039 Hypothyroidism, unspecified: Secondary | ICD-10-CM | POA: Insufficient documentation

## 2014-11-03 DIAGNOSIS — R9431 Abnormal electrocardiogram [ECG] [EKG]: Secondary | ICD-10-CM | POA: Diagnosis not present

## 2014-11-03 DIAGNOSIS — I4819 Other persistent atrial fibrillation: Secondary | ICD-10-CM

## 2014-11-03 DIAGNOSIS — G4700 Insomnia, unspecified: Secondary | ICD-10-CM | POA: Diagnosis not present

## 2014-11-03 DIAGNOSIS — M109 Gout, unspecified: Secondary | ICD-10-CM | POA: Insufficient documentation

## 2014-11-03 DIAGNOSIS — I440 Atrioventricular block, first degree: Secondary | ICD-10-CM | POA: Insufficient documentation

## 2014-11-03 DIAGNOSIS — N184 Chronic kidney disease, stage 4 (severe): Secondary | ICD-10-CM | POA: Diagnosis not present

## 2014-11-03 DIAGNOSIS — E119 Type 2 diabetes mellitus without complications: Secondary | ICD-10-CM | POA: Diagnosis not present

## 2014-11-03 DIAGNOSIS — I4891 Unspecified atrial fibrillation: Secondary | ICD-10-CM | POA: Diagnosis not present

## 2014-11-03 DIAGNOSIS — I481 Persistent atrial fibrillation: Secondary | ICD-10-CM | POA: Insufficient documentation

## 2014-11-03 DIAGNOSIS — Z7901 Long term (current) use of anticoagulants: Secondary | ICD-10-CM | POA: Insufficient documentation

## 2014-11-03 DIAGNOSIS — E782 Mixed hyperlipidemia: Secondary | ICD-10-CM | POA: Insufficient documentation

## 2014-11-03 HISTORY — PX: CARDIOVERSION: SHX1299

## 2014-11-03 LAB — GLUCOSE, CAPILLARY: Glucose-Capillary: 220 mg/dL — ABNORMAL HIGH (ref 65–99)

## 2014-11-03 SURGERY — CARDIOVERSION
Anesthesia: General

## 2014-11-03 MED ORDER — PROPOFOL 10 MG/ML IV BOLUS
INTRAVENOUS | Status: DC | PRN
  Administered 2014-11-03: 60 mg via INTRAVENOUS

## 2014-11-03 MED ORDER — LIDOCAINE HCL (CARDIAC) 20 MG/ML IV SOLN
INTRAVENOUS | Status: DC | PRN
  Administered 2014-11-03: 50 mg via INTRAVENOUS

## 2014-11-03 MED ORDER — SODIUM CHLORIDE 0.9 % IV SOLN
INTRAVENOUS | Status: DC
  Administered 2014-11-03: 20 mL via INTRAVENOUS

## 2014-11-03 NOTE — Discharge Instructions (Signed)

## 2014-11-03 NOTE — Anesthesia Procedure Notes (Signed)
Date/Time: 11/03/2014 10:08 AM Performed by: Merrilyn Puma B Pre-anesthesia Checklist: Patient identified, Timeout performed, Emergency Drugs available, Suction available and Patient being monitored Patient Re-evaluated:Patient Re-evaluated prior to inductionOxygen Delivery Method: Ambu bag Preoxygenation: Pre-oxygenation with 100% oxygen Intubation Type: IV induction Ventilation: Mask ventilation without difficulty Placement Confirmation: breath sounds checked- equal and bilateral Dental Injury: Teeth and Oropharynx as per pre-operative assessment

## 2014-11-03 NOTE — Transfer of Care (Signed)
Immediate Anesthesia Transfer of Care Note  Patient: Kathryn Richardson  Procedure(s) Performed: Procedure(s): CARDIOVERSION (N/A)  Patient Location: Endoscopy Unit  Anesthesia Type:General  Level of Consciousness: awake, alert  and oriented  Airway & Oxygen Therapy: Patient Spontanous Breathing  Post-op Assessment: Report given to RN, Post -op Vital signs reviewed and stable and Patient moving all extremities X 4  Post vital signs: Reviewed and stable  Last Vitals:  Filed Vitals:   11/03/14 0911  BP: 154/78  Pulse: 112  Temp: 36.5 C  Resp: 17    Complications: No apparent anesthesia complications

## 2014-11-03 NOTE — Interval H&P Note (Signed)
History and Physical Interval Note:  11/03/2014 9:44 AM  Kathryn Richardson  has presented today for surgery, with the diagnosis of afib  The various methods of treatment have been discussed with the patient and family. After consideration of risks, benefits and other options for treatment, the patient has consented to  Procedure(s): CARDIOVERSION (N/A) as a surgical intervention .  The patient's history has been reviewed, patient examined, no change in status, stable for surgery.  I have reviewed the patient's chart and labs.  Questions were answered to the patient's satisfaction.     Brayla Pat, Wonda Cheng

## 2014-11-03 NOTE — Anesthesia Postprocedure Evaluation (Signed)
Anesthesia Post Note  Patient: Kathryn Richardson  Procedure(s) Performed: Procedure(s) (LRB): CARDIOVERSION (N/A)  Anesthesia type: general  Patient location: PACU  Post pain: Pain level controlled  Post assessment: Patient's Cardiovascular Status Stable  Last Vitals:  Filed Vitals:   11/03/14 1020  BP: 134/56  Pulse: 66  Temp:   Resp: 12    Post vital signs: Reviewed and stable  Level of consciousness: sedated  Complications: No apparent anesthesia complications

## 2014-11-03 NOTE — CV Procedure (Signed)
    Cardioversion Note  Kathryn Richardson [id number] 1935-04-20  Procedure: DC Cardioversion Indications: atrial fib   Procedure Details Consent: Obtained Time Out: Verified patient identification, verified procedure, site/side was marked, verified correct patient position, special equipment/implants available, Radiology Safety Procedures followed,  medications/allergies/relevent history reviewed, required imaging and test results available.  Performed  The patient has been on adequate anticoagulation.  The patient received IV Lidocaine 50 mg IV followed by Propofol 60 mg  for sedation.  Synchronous cardioversion was performed at 120  joules.  The cardioversion was successful     Complications: No apparent complications Patient did tolerate procedure well.   Thayer Headings, Brooke Bonito., MD, [facility] 11/03/2014, 10:13 AM

## 2014-11-03 NOTE — Anesthesia Preprocedure Evaluation (Addendum)
Anesthesia Evaluation  Patient identified by MRN, date of birth, ID band Patient awake    Reviewed: Allergy & Precautions, NPO status   History of Anesthesia Complications Negative for: history of anesthetic complications  Airway Mallampati: II  TM Distance: >3 FB Neck ROM: Full    Dental  (+) Caps, Dental Advisory Given   Pulmonary neg pulmonary ROS,    Pulmonary exam normal       Cardiovascular hypertension, Pt. on medications + pacemaker Rhythm:Irregular Rate:Abnormal  Mild to mod LV depression   Neuro/Psych negative neurological ROS  negative psych ROS   GI/Hepatic Neg liver ROS, GERD-  ,  Endo/Other  diabetesHypothyroidism   Renal/GU Renal InsufficiencyRenal disease     Musculoskeletal   Abdominal   Peds  Hematology negative hematology ROS (+)   Anesthesia Other Findings   Reproductive/Obstetrics                         Anesthesia Physical Anesthesia Plan  ASA: III  Anesthesia Plan: General   Post-op Pain Management:    Induction: Intravenous  Airway Management Planned: Mask  Additional Equipment:   Intra-op Plan:   Post-operative Plan:   Informed Consent: I have reviewed the patients History and Physical, chart, labs and discussed the procedure including the risks, benefits and alternatives for the proposed anesthesia with the patient or authorized representative who has indicated his/her understanding and acceptance.   Dental advisory given  Plan Discussed with: CRNA, Anesthesiologist and Surgeon  Anesthesia Plan Comments:        Anesthesia Quick Evaluation

## 2014-11-03 NOTE — H&P (View-Only) (Signed)
Patient ID: Kathryn Richardson, female   DOB: 20-Aug-1934, 80 y.o.   MRN: [id number]      Primary Care Physician: Gara Kroner, MD Referring Physician:  Dr Irish Lack EP: Dr. Haydee Salter is a 80 y.o. female with a h/o persistent atrial fibrillation and bradycardia  receiving a PPM 12/16 for syncope, thought maybe caused from 1:1 aflutter.  She was discharged in afib on flecainide 50 mg  and diltiazem 240 mg qd was added for rate control.  However she remained in afib. Flecainide was increased to 100 mg bid without converting pt to SR and level of flecainide was  high on this dose, so she was reduced to 75 mg bid. She presented for cardioversion in January 2016 but DCCV was cancelled due to hyperglycemia and she was hospitalized. DCCV was successfully performed later after blood sugar stabilized. She has maintained SR until just recently.  She asked to be seen in the afib clinic today due to being in afib since last Thursday. She has been compliant with flecainide and coumadin. She has been out of town in [place] attending the funeral of her 21 year old mother in law and thinks maybe the stress of this caused her to slip into afib. She is mildly symptomatic with fatigue and dyspnea.  Today, she denies symptoms of  chest pain , orthopnea, PND, lower extremity edema, presyncope, syncope, or neurologic sequela.  .The patient is tolerating medications without difficulties and is otherwise without complaint today.   Past Medical History  Diagnosis Date  . Essential hypertension   . Gout   . GERD (gastroesophageal reflux disease)   . Insomnia   . Atrial fibrillation   . Type 2 diabetes mellitus   . Allergic rhinitis   . Hyperlipemia, mixed   . Chronic renal disease, stage IV    Past Surgical History  Procedure Laterality Date  . Cesarean section      x2  . Esophageal dilation    . Permanent pacemaker insertion N/A 04/26/2014    Procedure: PERMANENT PACEMAKER INSERTION;  Surgeon: Evans Lance, MD;  Location: [facility] CATH LAB;  Service: Cardiovascular;  Laterality: N/A;  . Tee without cardioversion N/A 05/29/2014    Procedure: TRANSESOPHAGEAL ECHOCARDIOGRAM (TEE);  Surgeon: Fay Records, MD;  Location: [facility] ENDOSCOPY;  Service: Cardiovascular;  Laterality: N/A;  . Cardioversion N/A 05/29/2014    Procedure: CARDIOVERSION;  Surgeon: Fay Records, MD;  Location: [facility] ENDOSCOPY;  Service: Cardiovascular;  Laterality: N/A;    Current Outpatient Prescriptions  Medication Sig Dispense Refill  . allopurinol (ZYLOPRIM) 100 MG tablet Take 100 mg by mouth at bedtime.    Marland Kitchen atorvastatin (LIPITOR) 80 MG tablet Take 80 mg by mouth at bedtime.     . benazepril (LOTENSIN) 20 MG tablet Take 20 mg by mouth daily.    Marland Kitchen diltiazem (CARDIZEM CD) 240 MG 24 hr capsule Take 1 capsule (240 mg total) by mouth daily. 90 capsule 2  . flecainide (TAMBOCOR) 50 MG tablet TAKE 1.5 TABLET ( 75 MG TOTAL) BY MOUTH EVERY 12 HOURS    . furosemide (LASIX) 40 MG tablet Take 40 mg by mouth daily.    Marland Kitchen JANUVIA 25 MG tablet Take 25 mg by mouth daily.  5  . KLOR-CON M20 20 MEQ tablet Take 20 mEq by mouth daily.  3  . levothyroxine (SYNTHROID, LEVOTHROID) 50 MCG tablet Take 50 mcg by mouth daily. On a empty stomach.    . repaglinide (PRANDIN) 0.5  MG tablet Take 0.5 mg by mouth 2 (two) times daily before a meal.    . warfarin (COUMADIN) 5 MG tablet Take as directed by coumadin clinic 30 tablet 3   No current facility-administered medications for this encounter.    Allergies  Allergen Reactions  . Cortisone Other (See Comments)    Hyperglycemia   . Prednisone Other (See Comments)    Hyperglycemia     History   Social History  . Marital Status: Married    Spouse Name: N/A  . Number of Children: N/A  . Years of Education: N/A   Occupational History  . Not on file.   Social History Main Topics  . Smoking status: Never Smoker   . Smokeless tobacco: Never Used  . Alcohol Use: No  . Drug Use: No  . Sexual Activity:  Not on file   Other Topics Concern  . Not on file   Social History Narrative   Lives in [place].  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 Coronary artery disease Father 35  . Heart attack Father   . Congestive Heart Failure Mother   . Alzheimer's disease Mother    Physical Exam: Filed Vitals:   10/31/14 1146  BP: 138/86  Pulse: 91  Height: 5\' 3"  (1.6 m)  Weight: 162 lb 3.2 oz (73.573 kg)    GEN- The patient is well appearing, alert and oriented x 3 today.   Head- normocephalic, atraumatic Eyes-  Sclera clear, conjunctiva pink Ears- hearing intact Oropharynx- clear Neck- supple, no JVP Lymph- no cervical lymphadenopathy Lungs- Clear to ausculation bilaterally, normal work of breathing Heart-irregular rate and rhythm, no murmurs, rubs or gallops, PMI not laterally displaced. Pacer site well healed. GI- soft, NT, ND, + BS Extremities- no clubbing, cyanosis, or edema   EKG today reveals afib at 91 bpm, with paced ventricular complexes, with ST/T wave abnormalities. INR- MAY- 2.1, April 19th -2.2, April 5th- 3.4  Assessment and Plan:  1.Symptomatic Persistent afib    Will be scheduled for cardioversion Pre procedure labs to be drawn including INR Pt has been therapeutic reviewing labs back thru April. States compliance with coumadin  F/u with afib clinic post DCCV

## 2014-11-06 ENCOUNTER — Encounter (HOSPITAL_COMMUNITY): Payer: Self-pay | Admitting: Cardiovascular Disease

## 2014-11-09 NOTE — Progress Notes (Signed)
Remote pacemaker transmission.   

## 2014-11-10 ENCOUNTER — Ambulatory Visit (INDEPENDENT_AMBULATORY_CARE_PROVIDER_SITE_OTHER): Payer: Medicare Other | Admitting: *Deleted

## 2014-11-10 ENCOUNTER — Ambulatory Visit (HOSPITAL_COMMUNITY)
Admission: RE | Admit: 2014-11-10 | Discharge: 2014-11-10 | Disposition: A | Discharge status: Home / Self Care | Payer: Medicare Other | Source: Ambulatory Visit | Attending: Nurse Practitioner | Admitting: Nurse Practitioner

## 2014-11-10 ENCOUNTER — Encounter (HOSPITAL_COMMUNITY): Payer: Self-pay | Admitting: Nurse Practitioner

## 2014-11-10 VITALS — BP 140/78 | HR 82 | Ht 63.0 in | Wt 159.4 lb

## 2014-11-10 DIAGNOSIS — Z5181 Encounter for therapeutic drug level monitoring: Secondary | ICD-10-CM | POA: Diagnosis not present

## 2014-11-10 DIAGNOSIS — Z7901 Long term (current) use of anticoagulants: Secondary | ICD-10-CM | POA: Insufficient documentation

## 2014-11-10 DIAGNOSIS — E1122 Type 2 diabetes mellitus with diabetic chronic kidney disease: Secondary | ICD-10-CM | POA: Diagnosis not present

## 2014-11-10 DIAGNOSIS — I481 Persistent atrial fibrillation: Secondary | ICD-10-CM

## 2014-11-10 DIAGNOSIS — E782 Mixed hyperlipidemia: Secondary | ICD-10-CM | POA: Diagnosis not present

## 2014-11-10 DIAGNOSIS — K219 Gastro-esophageal reflux disease without esophagitis: Secondary | ICD-10-CM | POA: Insufficient documentation

## 2014-11-10 DIAGNOSIS — Z950 Presence of cardiac pacemaker: Secondary | ICD-10-CM | POA: Insufficient documentation

## 2014-11-10 DIAGNOSIS — N184 Chronic kidney disease, stage 4 (severe): Secondary | ICD-10-CM | POA: Diagnosis not present

## 2014-11-10 DIAGNOSIS — I4819 Other persistent atrial fibrillation: Secondary | ICD-10-CM

## 2014-11-10 DIAGNOSIS — Z8249 Family history of ischemic heart disease and other diseases of the circulatory system: Secondary | ICD-10-CM | POA: Diagnosis not present

## 2014-11-10 DIAGNOSIS — I129 Hypertensive chronic kidney disease with stage 1 through stage 4 chronic kidney disease, or unspecified chronic kidney disease: Secondary | ICD-10-CM | POA: Insufficient documentation

## 2014-11-10 DIAGNOSIS — Z79899 Other long term (current) drug therapy: Secondary | ICD-10-CM | POA: Diagnosis not present

## 2014-11-10 DIAGNOSIS — I4891 Unspecified atrial fibrillation: Secondary | ICD-10-CM

## 2014-11-10 LAB — POCT INR: INR: 2.7

## 2014-11-10 NOTE — Patient Instructions (Signed)
Dr. Lovena Le as scheduled in December 2016 Follow up with Afib Clinic as needed

## 2014-11-10 NOTE — Progress Notes (Signed)
Patient ID: Kathryn Richardson, female   DOB: Nov 05, 1934, 80 y.o.   MRN: [id number]      Primary Care Physician: Gara Kroner, MD Referring Physician:  Dr Irish Lack EP: Dr. Haydee Salter is a 80 y.o. female with a h/o persistent atrial fibrillation and bradycardia  receiving a PPM 12/16 for syncope, thought maybe caused from 1:1 aflutter.  She was discharged in afib on flecainide 50 mg  and diltiazem 240 mg qd was added for rate control.  However she remained in afib. Flecainide was increased to 100 mg bid without converting pt to SR and level of flecainide was  high on this dose, so she was reduced to 75 mg bid. She presented for cardioversion in January 2016 but DCCV was cancelled due to hyperglycemia and she was hospitalized. DCCV was successfully performed later after blood sugar stabilized. She has maintained SR until just recently.  She asked to be seen in the afib clinic 6/21 due to being in afib since the previousThursday. She has been compliant with flecainide and coumadin. She has been out of town in [place] attending the funeral of her 54 year old mother in law and thinks maybe the stress of this caused her to slip into afib. She is mildly symptomatic with fatigue and dyspnea.   She was set up for DCCV 6/24 which was successful. She returns today having maintained SR and feeling well.  Today, she denies symptoms of  chest pain , orthopnea, PND, lower extremity edema, presyncope, syncope, or neurologic sequela.  .The patient is tolerating medications without difficulties and is otherwise without complaint today.   Past Medical History  Diagnosis Date  . Essential hypertension   . Gout   . GERD (gastroesophageal reflux disease)   . Insomnia   . Atrial fibrillation   . Type 2 diabetes mellitus   . Allergic rhinitis   . Hyperlipemia, mixed   . Chronic renal disease, stage IV    Past Surgical History  Procedure Laterality Date  . Cesarean section      x2  . Esophageal  dilation    . Permanent pacemaker insertion N/A 04/26/2014    Procedure: PERMANENT PACEMAKER INSERTION;  Surgeon: Evans Lance, MD;  Location: [facility] CATH LAB;  Service: Cardiovascular;  Laterality: N/A;  . Tee without cardioversion N/A 05/29/2014    Procedure: TRANSESOPHAGEAL ECHOCARDIOGRAM (TEE);  Surgeon: Fay Records, MD;  Location: [facility] ENDOSCOPY;  Service: Cardiovascular;  Laterality: N/A;  . Cardioversion N/A 05/29/2014    Procedure: CARDIOVERSION;  Surgeon: Fay Records, MD;  Location: [place];  Service: Cardiovascular;  Laterality: N/A;  . Cardioversion N/A 11/03/2014    Procedure: CARDIOVERSION;  Surgeon: Thayer Headings, MD;  Location: [facility] ENDOSCOPY;  Service: Cardiovascular;  Laterality: N/A;    Current Outpatient Prescriptions  Medication Sig Dispense Refill  . allopurinol (ZYLOPRIM) 100 MG tablet Take 100 mg by mouth at bedtime.    Marland Kitchen atorvastatin (LIPITOR) 80 MG tablet Take 80 mg by mouth at bedtime.     . benazepril (LOTENSIN) 20 MG tablet Take 20 mg by mouth daily.    Marland Kitchen diltiazem (CARDIZEM CD) 240 MG 24 hr capsule Take 1 capsule (240 mg total) by mouth daily. 90 capsule 2  . flecainide (TAMBOCOR) 50 MG tablet TAKE 1.5 TABLET ( 75 MG TOTAL) BY MOUTH EVERY 12 HOURS    . furosemide (LASIX) 40 MG tablet Take 40 mg by mouth daily.    Marland Kitchen JANUVIA 25 MG tablet Take  25 mg by mouth daily.  5  . KLOR-CON M20 20 MEQ tablet Take 20 mEq by mouth daily.  3  . levothyroxine (SYNTHROID, LEVOTHROID) 50 MCG tablet Take 50 mcg by mouth daily. On a empty stomach.    . repaglinide (PRANDIN) 0.5 MG tablet Take 0.5 mg by mouth 2 (two) times daily before a meal.    . warfarin (COUMADIN) 5 MG tablet Take as directed by coumadin clinic (Patient taking differently: Take 2.5-5 mg by mouth daily at 6 PM. Take 2.5 mg by mouth daily on Tues, Thurs, and Saturday. Take 5 mg by mouth on all other days (Mon, Wed, Frid, and Sunday)) 30 tablet 3   No current facility-administered medications for this encounter.     Allergies  Allergen Reactions  . Cortisone Other (See Comments)    Hyperglycemia   . Prednisone Other (See Comments)    Hyperglycemia     History   Social History  . Marital Status: Married    Spouse Name: N/A  . Number of Children: N/A  . Years of Education: N/A   Occupational History  . Not on file.   Social History Main Topics  . Smoking status: Never Smoker   . Smokeless tobacco: Never Used  . Alcohol Use: No  . Drug Use: No  . Sexual Activity: Not on file   Other Topics Concern  . Not on file   Social History Narrative   Lives in [place].  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 Coronary artery disease Father 32  . Heart attack Father   . Congestive Heart Failure Mother   . Alzheimer's disease Mother    Physical Exam: Filed Vitals:   11/10/14 1159  BP: 140/78  Pulse: 82  Height: 5\' 3"  (1.6 m)  Weight: 159 lb 6.4 oz (72.303 kg)    GEN- The patient is well appearing, alert and oriented x 3 today.   Head- normocephalic, atraumatic Eyes-  Sclera clear, conjunctiva pink Ears- hearing intact Oropharynx- clear Neck- supple, no JVP Lymph- no cervical lymphadenopathy Lungs- Clear to ausculation bilaterally, normal work of breathing Heart-irregular rate and rhythm, no murmurs, rubs or gallops, PMI not laterally displaced. Pacer site well healed. GI- soft, NT, ND, + BS Extremities- no clubbing, cyanosis, or edema   EKG today  Reveals SR, atrial paced, PR int 312 ms, QRS 96 ms, QTc 464 ms. INR- Pending today.  Assessment and Plan:  1.Symptomatic Persistent afib   Maintaining SR since DCCV Continue flecainide, cardizem INR pending today  2. PPM  Pt is scheduled to f/u with Dr. Lovena Le in December  3. HTN  Stable today.  F/u in afib clinic as needed

## 2014-11-17 LAB — CUP PACEART REMOTE DEVICE CHECK
Battery Remaining Longevity: 120 mo
Battery Voltage: 3.02 V
Brady Statistic AP VP Percent: 2.09 %
Brady Statistic AP VS Percent: 62.08 %
Brady Statistic RA Percent Paced: 64.16 %
Brady Statistic RV Percent Paced: 19.97 %
Lead Channel Impedance Value: 437 Ohm
Lead Channel Impedance Value: 456 Ohm
Lead Channel Impedance Value: 494 Ohm
Lead Channel Pacing Threshold Amplitude: 1.125 V
Lead Channel Pacing Threshold Pulse Width: 0.4 ms
Lead Channel Sensing Intrinsic Amplitude: 0.75 mV
Lead Channel Sensing Intrinsic Amplitude: 0.75 mV
Lead Channel Setting Pacing Amplitude: 2.5 V
Lead Channel Setting Pacing Pulse Width: 0.4 ms
Lead Channel Setting Sensing Sensitivity: 0.9 mV
MDC IDC MSMT LEADCHNL RA IMPEDANCE VALUE: 342 Ohm
MDC IDC MSMT LEADCHNL RV PACING THRESHOLD AMPLITUDE: 1 V
MDC IDC MSMT LEADCHNL RV PACING THRESHOLD PULSEWIDTH: 0.4 ms
MDC IDC MSMT LEADCHNL RV SENSING INTR AMPL: 13.625 mV
MDC IDC MSMT LEADCHNL RV SENSING INTR AMPL: 13.625 mV
MDC IDC SESS DTM: 20160621140319
MDC IDC SET LEADCHNL RA PACING AMPLITUDE: 2.5 V
MDC IDC STAT BRADY AS VP PERCENT: 17.88 %
MDC IDC STAT BRADY AS VS PERCENT: 17.95 %
Zone Setting Detection Interval: 350 ms
Zone Setting Detection Interval: 400 ms

## 2014-11-22 ENCOUNTER — Other Ambulatory Visit: Payer: Self-pay

## 2014-11-22 MED ORDER — FLECAINIDE ACETATE 50 MG PO TABS: 50.0000 mg | ORAL_TABLET | Freq: Two times a day (BID) | ORAL | Status: DC

## 2014-11-29 ENCOUNTER — Other Ambulatory Visit: Payer: Self-pay | Admitting: Interventional Cardiology

## 2014-11-29 NOTE — Telephone Encounter (Signed)
COUMADIN REFILL. THANK YOU FOR YOUR TIME.

## 2014-12-06 ENCOUNTER — Encounter: Payer: Self-pay | Admitting: *Deleted

## 2014-12-07 ENCOUNTER — Ambulatory Visit (INDEPENDENT_AMBULATORY_CARE_PROVIDER_SITE_OTHER): Payer: Medicare Other | Admitting: Pharmacist

## 2014-12-07 DIAGNOSIS — I4891 Unspecified atrial fibrillation: Secondary | ICD-10-CM | POA: Diagnosis not present

## 2014-12-07 DIAGNOSIS — I4819 Other persistent atrial fibrillation: Secondary | ICD-10-CM

## 2014-12-07 DIAGNOSIS — I481 Persistent atrial fibrillation: Secondary | ICD-10-CM

## 2014-12-07 DIAGNOSIS — Z5181 Encounter for therapeutic drug level monitoring: Secondary | ICD-10-CM | POA: Diagnosis not present

## 2014-12-07 LAB — POCT INR: INR: 3.4

## 2014-12-11 ENCOUNTER — Encounter: Payer: Self-pay | Admitting: Internal Medicine

## 2014-12-14 DIAGNOSIS — I4891 Unspecified atrial fibrillation: Secondary | ICD-10-CM | POA: Diagnosis not present

## 2014-12-14 DIAGNOSIS — E039 Hypothyroidism, unspecified: Secondary | ICD-10-CM | POA: Diagnosis not present

## 2014-12-14 DIAGNOSIS — F341 Dysthymic disorder: Secondary | ICD-10-CM | POA: Diagnosis not present

## 2014-12-14 DIAGNOSIS — N184 Chronic kidney disease, stage 4 (severe): Secondary | ICD-10-CM | POA: Diagnosis not present

## 2014-12-14 DIAGNOSIS — E1122 Type 2 diabetes mellitus with diabetic chronic kidney disease: Secondary | ICD-10-CM | POA: Diagnosis not present

## 2014-12-14 DIAGNOSIS — E559 Vitamin D deficiency, unspecified: Secondary | ICD-10-CM | POA: Diagnosis not present

## 2014-12-14 DIAGNOSIS — I129 Hypertensive chronic kidney disease with stage 1 through stage 4 chronic kidney disease, or unspecified chronic kidney disease: Secondary | ICD-10-CM | POA: Diagnosis not present

## 2014-12-14 DIAGNOSIS — E785 Hyperlipidemia, unspecified: Secondary | ICD-10-CM | POA: Diagnosis not present

## 2014-12-14 DIAGNOSIS — Z1389 Encounter for screening for other disorder: Secondary | ICD-10-CM | POA: Diagnosis not present

## 2014-12-14 DIAGNOSIS — M109 Gout, unspecified: Secondary | ICD-10-CM | POA: Diagnosis not present

## 2014-12-14 DIAGNOSIS — R609 Edema, unspecified: Secondary | ICD-10-CM | POA: Diagnosis not present

## 2015-01-04 ENCOUNTER — Ambulatory Visit (INDEPENDENT_AMBULATORY_CARE_PROVIDER_SITE_OTHER): Payer: Medicare Other | Admitting: *Deleted

## 2015-01-04 DIAGNOSIS — I481 Persistent atrial fibrillation: Secondary | ICD-10-CM | POA: Diagnosis not present

## 2015-01-04 DIAGNOSIS — I4819 Other persistent atrial fibrillation: Secondary | ICD-10-CM

## 2015-01-04 DIAGNOSIS — Z5181 Encounter for therapeutic drug level monitoring: Secondary | ICD-10-CM | POA: Diagnosis not present

## 2015-01-04 DIAGNOSIS — I4891 Unspecified atrial fibrillation: Secondary | ICD-10-CM | POA: Diagnosis not present

## 2015-01-04 LAB — POCT INR: INR: 3.7

## 2015-01-11 DIAGNOSIS — E785 Hyperlipidemia, unspecified: Secondary | ICD-10-CM | POA: Diagnosis not present

## 2015-01-11 DIAGNOSIS — R809 Proteinuria, unspecified: Secondary | ICD-10-CM | POA: Diagnosis not present

## 2015-01-11 DIAGNOSIS — I4891 Unspecified atrial fibrillation: Secondary | ICD-10-CM | POA: Diagnosis not present

## 2015-01-11 DIAGNOSIS — R112 Nausea with vomiting, unspecified: Secondary | ICD-10-CM | POA: Diagnosis not present

## 2015-01-11 DIAGNOSIS — E039 Hypothyroidism, unspecified: Secondary | ICD-10-CM | POA: Diagnosis not present

## 2015-01-11 DIAGNOSIS — E1122 Type 2 diabetes mellitus with diabetic chronic kidney disease: Secondary | ICD-10-CM | POA: Diagnosis not present

## 2015-01-11 DIAGNOSIS — N183 Chronic kidney disease, stage 3 (moderate): Secondary | ICD-10-CM | POA: Diagnosis not present

## 2015-01-11 DIAGNOSIS — Z79899 Other long term (current) drug therapy: Secondary | ICD-10-CM | POA: Diagnosis not present

## 2015-01-11 DIAGNOSIS — G4700 Insomnia, unspecified: Secondary | ICD-10-CM | POA: Diagnosis not present

## 2015-01-11 DIAGNOSIS — I129 Hypertensive chronic kidney disease with stage 1 through stage 4 chronic kidney disease, or unspecified chronic kidney disease: Secondary | ICD-10-CM | POA: Diagnosis not present

## 2015-01-11 DIAGNOSIS — D631 Anemia in chronic kidney disease: Secondary | ICD-10-CM | POA: Diagnosis not present

## 2015-01-11 DIAGNOSIS — Z7901 Long term (current) use of anticoagulants: Secondary | ICD-10-CM | POA: Diagnosis not present

## 2015-01-18 ENCOUNTER — Ambulatory Visit (INDEPENDENT_AMBULATORY_CARE_PROVIDER_SITE_OTHER): Payer: Medicare Other | Admitting: *Deleted

## 2015-01-18 DIAGNOSIS — I481 Persistent atrial fibrillation: Secondary | ICD-10-CM | POA: Diagnosis not present

## 2015-01-18 DIAGNOSIS — I4891 Unspecified atrial fibrillation: Secondary | ICD-10-CM

## 2015-01-18 DIAGNOSIS — I4819 Other persistent atrial fibrillation: Secondary | ICD-10-CM

## 2015-01-18 DIAGNOSIS — Z5181 Encounter for therapeutic drug level monitoring: Secondary | ICD-10-CM

## 2015-01-18 LAB — POCT INR: INR: 3.6

## 2015-01-24 DIAGNOSIS — E1122 Type 2 diabetes mellitus with diabetic chronic kidney disease: Secondary | ICD-10-CM | POA: Diagnosis not present

## 2015-01-24 DIAGNOSIS — N184 Chronic kidney disease, stage 4 (severe): Secondary | ICD-10-CM | POA: Diagnosis not present

## 2015-01-24 DIAGNOSIS — Z23 Encounter for immunization: Secondary | ICD-10-CM | POA: Diagnosis not present

## 2015-01-24 DIAGNOSIS — E1165 Type 2 diabetes mellitus with hyperglycemia: Secondary | ICD-10-CM | POA: Diagnosis not present

## 2015-02-01 ENCOUNTER — Ambulatory Visit (INDEPENDENT_AMBULATORY_CARE_PROVIDER_SITE_OTHER): Payer: Medicare Other | Admitting: *Deleted

## 2015-02-01 ENCOUNTER — Telehealth: Payer: Self-pay | Admitting: Cardiology

## 2015-02-01 DIAGNOSIS — Z5181 Encounter for therapeutic drug level monitoring: Secondary | ICD-10-CM | POA: Diagnosis not present

## 2015-02-01 DIAGNOSIS — I481 Persistent atrial fibrillation: Secondary | ICD-10-CM | POA: Diagnosis not present

## 2015-02-01 DIAGNOSIS — I4891 Unspecified atrial fibrillation: Secondary | ICD-10-CM

## 2015-02-01 DIAGNOSIS — I4819 Other persistent atrial fibrillation: Secondary | ICD-10-CM

## 2015-02-01 LAB — POCT INR: INR: 2

## 2015-02-01 NOTE — Progress Notes (Signed)
Remote pacemaker transmission.   

## 2015-02-01 NOTE — Telephone Encounter (Signed)
LMOVM reminding pt to send remote transmission.   

## 2015-02-05 LAB — CUP PACEART REMOTE DEVICE CHECK
Battery Remaining Longevity: 112 mo
Battery Voltage: 3.02 V
Brady Statistic AP VP Percent: 0.08 %
Brady Statistic AP VS Percent: 99.88 %
Brady Statistic AS VP Percent: 0 %
Brady Statistic AS VS Percent: 0.04 %
Date Time Interrogation Session: 20160922192607
Lead Channel Impedance Value: 361 Ohm
Lead Channel Impedance Value: 475 Ohm
Lead Channel Impedance Value: 475 Ohm
Lead Channel Pacing Threshold Amplitude: 0.875 V
Lead Channel Pacing Threshold Pulse Width: 0.4 ms
Lead Channel Sensing Intrinsic Amplitude: 13.125 mV
Lead Channel Sensing Intrinsic Amplitude: 13.125 mV
Lead Channel Sensing Intrinsic Amplitude: 2 mV
Lead Channel Setting Pacing Amplitude: 2.25 V
Lead Channel Setting Pacing Amplitude: 2.5 V
Lead Channel Setting Sensing Sensitivity: 0.9 mV
MDC IDC MSMT LEADCHNL RA PACING THRESHOLD AMPLITUDE: 1.125 V
MDC IDC MSMT LEADCHNL RA PACING THRESHOLD PULSEWIDTH: 0.4 ms
MDC IDC MSMT LEADCHNL RA SENSING INTR AMPL: 2 mV
MDC IDC MSMT LEADCHNL RV IMPEDANCE VALUE: 513 Ohm
MDC IDC SET LEADCHNL RV PACING PULSEWIDTH: 0.4 ms
MDC IDC SET ZONE DETECTION INTERVAL: 400 ms
MDC IDC STAT BRADY RA PERCENT PACED: 99.96 %
MDC IDC STAT BRADY RV PERCENT PACED: 0.08 %
Zone Setting Detection Interval: 400 ms

## 2015-02-20 ENCOUNTER — Encounter: Payer: Self-pay | Admitting: Cardiology

## 2015-02-22 ENCOUNTER — Ambulatory Visit (INDEPENDENT_AMBULATORY_CARE_PROVIDER_SITE_OTHER): Payer: Medicare Other

## 2015-02-22 DIAGNOSIS — I4819 Other persistent atrial fibrillation: Secondary | ICD-10-CM

## 2015-02-22 DIAGNOSIS — I4891 Unspecified atrial fibrillation: Secondary | ICD-10-CM

## 2015-02-22 DIAGNOSIS — Z5181 Encounter for therapeutic drug level monitoring: Secondary | ICD-10-CM

## 2015-02-22 DIAGNOSIS — I481 Persistent atrial fibrillation: Secondary | ICD-10-CM

## 2015-02-22 LAB — POCT INR: INR: 1.6

## 2015-03-01 ENCOUNTER — Encounter: Payer: Self-pay | Admitting: Internal Medicine

## 2015-03-08 ENCOUNTER — Ambulatory Visit (INDEPENDENT_AMBULATORY_CARE_PROVIDER_SITE_OTHER): Payer: Medicare Other | Admitting: *Deleted

## 2015-03-08 DIAGNOSIS — I4891 Unspecified atrial fibrillation: Secondary | ICD-10-CM | POA: Diagnosis not present

## 2015-03-08 DIAGNOSIS — I481 Persistent atrial fibrillation: Secondary | ICD-10-CM | POA: Diagnosis not present

## 2015-03-08 DIAGNOSIS — I4819 Other persistent atrial fibrillation: Secondary | ICD-10-CM

## 2015-03-08 DIAGNOSIS — Z5181 Encounter for therapeutic drug level monitoring: Secondary | ICD-10-CM

## 2015-03-08 LAB — POCT INR: INR: 1.7

## 2015-03-22 ENCOUNTER — Ambulatory Visit (INDEPENDENT_AMBULATORY_CARE_PROVIDER_SITE_OTHER): Payer: Medicare Other | Admitting: *Deleted

## 2015-03-22 DIAGNOSIS — I481 Persistent atrial fibrillation: Secondary | ICD-10-CM | POA: Diagnosis not present

## 2015-03-22 DIAGNOSIS — Z5181 Encounter for therapeutic drug level monitoring: Secondary | ICD-10-CM | POA: Diagnosis not present

## 2015-03-22 DIAGNOSIS — I4891 Unspecified atrial fibrillation: Secondary | ICD-10-CM | POA: Diagnosis not present

## 2015-03-22 DIAGNOSIS — I4819 Other persistent atrial fibrillation: Secondary | ICD-10-CM

## 2015-03-22 LAB — POCT INR: INR: 1.7

## 2015-04-03 ENCOUNTER — Other Ambulatory Visit: Payer: Self-pay | Admitting: Physician Assistant

## 2015-04-04 ENCOUNTER — Ambulatory Visit (INDEPENDENT_AMBULATORY_CARE_PROVIDER_SITE_OTHER): Payer: Medicare Other | Admitting: *Deleted

## 2015-04-04 DIAGNOSIS — I4819 Other persistent atrial fibrillation: Secondary | ICD-10-CM

## 2015-04-04 DIAGNOSIS — Z5181 Encounter for therapeutic drug level monitoring: Secondary | ICD-10-CM | POA: Diagnosis not present

## 2015-04-04 DIAGNOSIS — I481 Persistent atrial fibrillation: Secondary | ICD-10-CM | POA: Diagnosis not present

## 2015-04-04 DIAGNOSIS — I4891 Unspecified atrial fibrillation: Secondary | ICD-10-CM | POA: Diagnosis not present

## 2015-04-04 LAB — POCT INR: INR: 2.2

## 2015-04-17 ENCOUNTER — Other Ambulatory Visit: Payer: Self-pay | Admitting: Internal Medicine

## 2015-04-25 ENCOUNTER — Ambulatory Visit (INDEPENDENT_AMBULATORY_CARE_PROVIDER_SITE_OTHER): Payer: Medicare Other | Admitting: *Deleted

## 2015-04-25 DIAGNOSIS — Z7984 Long term (current) use of oral hypoglycemic drugs: Secondary | ICD-10-CM | POA: Diagnosis not present

## 2015-04-25 DIAGNOSIS — I481 Persistent atrial fibrillation: Secondary | ICD-10-CM | POA: Diagnosis not present

## 2015-04-25 DIAGNOSIS — I4891 Unspecified atrial fibrillation: Secondary | ICD-10-CM | POA: Diagnosis not present

## 2015-04-25 DIAGNOSIS — Z5181 Encounter for therapeutic drug level monitoring: Secondary | ICD-10-CM | POA: Diagnosis not present

## 2015-04-25 DIAGNOSIS — I4819 Other persistent atrial fibrillation: Secondary | ICD-10-CM

## 2015-04-25 DIAGNOSIS — E1122 Type 2 diabetes mellitus with diabetic chronic kidney disease: Secondary | ICD-10-CM | POA: Diagnosis not present

## 2015-04-25 DIAGNOSIS — N184 Chronic kidney disease, stage 4 (severe): Secondary | ICD-10-CM | POA: Diagnosis not present

## 2015-04-25 LAB — POCT INR: INR: 1.8

## 2015-05-01 ENCOUNTER — Encounter: Payer: Self-pay | Admitting: Internal Medicine

## 2015-05-01 ENCOUNTER — Ambulatory Visit (INDEPENDENT_AMBULATORY_CARE_PROVIDER_SITE_OTHER): Payer: Medicare Other | Admitting: Internal Medicine

## 2015-05-01 VITALS — BP 158/76 | HR 68 | Ht 63.0 in | Wt 168.0 lb

## 2015-05-01 DIAGNOSIS — I10 Essential (primary) hypertension: Secondary | ICD-10-CM

## 2015-05-01 DIAGNOSIS — I4891 Unspecified atrial fibrillation: Secondary | ICD-10-CM

## 2015-05-01 DIAGNOSIS — Z950 Presence of cardiac pacemaker: Secondary | ICD-10-CM | POA: Diagnosis not present

## 2015-05-01 DIAGNOSIS — R001 Bradycardia, unspecified: Secondary | ICD-10-CM | POA: Diagnosis not present

## 2015-05-01 DIAGNOSIS — Z7901 Long term (current) use of anticoagulants: Secondary | ICD-10-CM

## 2015-05-01 LAB — CUP PACEART INCLINIC DEVICE CHECK
Battery Remaining Longevity: 106 mo
Brady Statistic AP VP Percent: 0.07 %
Brady Statistic AS VP Percent: 0 %
Brady Statistic AS VS Percent: 0.06 %
Implantable Lead Implant Date: 20151217
Implantable Lead Implant Date: 20151217
Implantable Lead Location: 753859
Implantable Lead Model: 5076
Lead Channel Impedance Value: 437 Ohm
Lead Channel Impedance Value: 437 Ohm
Lead Channel Impedance Value: 475 Ohm
Lead Channel Pacing Threshold Amplitude: 0.875 V
Lead Channel Setting Pacing Amplitude: 2.25 V
Lead Channel Setting Sensing Sensitivity: 0.9 mV
MDC IDC LEAD LOCATION: 753860
MDC IDC MSMT BATTERY VOLTAGE: 3.02 V
MDC IDC MSMT LEADCHNL RA IMPEDANCE VALUE: 342 Ohm
MDC IDC MSMT LEADCHNL RA PACING THRESHOLD AMPLITUDE: 1 V
MDC IDC MSMT LEADCHNL RA PACING THRESHOLD PULSEWIDTH: 0.4 ms
MDC IDC MSMT LEADCHNL RA SENSING INTR AMPL: 1 mV
MDC IDC MSMT LEADCHNL RV PACING THRESHOLD PULSEWIDTH: 0.4 ms
MDC IDC MSMT LEADCHNL RV SENSING INTR AMPL: 12.1 mV
MDC IDC SESS DTM: 20161220171157
MDC IDC SET LEADCHNL RV PACING AMPLITUDE: 2.5 V
MDC IDC SET LEADCHNL RV PACING PULSEWIDTH: 0.4 ms
MDC IDC STAT BRADY AP VS PERCENT: 99.87 %
MDC IDC STAT BRADY RA PERCENT PACED: 99.94 %
MDC IDC STAT BRADY RV PERCENT PACED: 0.07 %

## 2015-05-01 MED ORDER — APIXABAN 2.5 MG PO TABS: 2.5000 mg | ORAL_TABLET | Freq: Two times a day (BID) | ORAL | Status: DC

## 2015-05-01 NOTE — Assessment & Plan Note (Signed)
We discussed switching to Eliquis from coumadin. She has been compliant with coumadin but had up and down INR's. I have given a script for 2.5 mg twice daily.

## 2015-05-01 NOTE — Progress Notes (Signed)
HPI Kathryn Richardson returns today, 12 months after PPM insertion for symptomatic bradycardia due to sinus node dysfunction. She has PAF In the interim, she had a DCCV and continues to do well with no chest pain or sob. She is considering knee replacement surgery. She is frustrated by her inability to maintain therapeutic anti-coagulation.  Allergies  Allergen Reactions  . Cortisone Other (See Comments)    Hyperglycemia   . Prednisone Other (See Comments)    Hyperglycemia      Current Outpatient Prescriptions  Medication Sig Dispense Refill  . allopurinol (ZYLOPRIM) 100 MG tablet Take 100 mg by mouth at bedtime.    Marland Kitchen atorvastatin (LIPITOR) 80 MG tablet Take 80 mg by mouth at bedtime.     . benazepril (LOTENSIN) 20 MG tablet Take 20 mg by mouth daily.    Marland Kitchen diltiazem (CARDIZEM CD) 240 MG 24 hr capsule Take 1 capsule (240 mg total) by mouth daily. 90 capsule 2  . diltiazem (CARDIZEM CD) 240 MG 24 hr capsule TAKE 1 CAPSULE (240 MG TOTAL) BY MOUTH DAILY. 30 capsule 6  . flecainide (TAMBOCOR) 50 MG tablet TAKE 1 & 1/2 TABLETS BY MOUTH EVERY 12 HOURS 90 tablet 1  . furosemide (LASIX) 40 MG tablet Take 40 mg by mouth daily.    . Insulin Detemir (LEVEMIR DISH) Inject into the skin. 36 UNITS IN THE MORNING    . JANUVIA 25 MG tablet Take 25 mg by mouth daily.  5  . KLOR-CON M20 20 MEQ tablet Take 20 mEq by mouth daily.  3  . levothyroxine (SYNTHROID, LEVOTHROID) 50 MCG tablet Take 50 mcg by mouth daily. On a empty stomach.    . repaglinide (PRANDIN) 0.5 MG tablet Take 0.5 mg by mouth 2 (two) times daily before a meal.    . apixaban (ELIQUIS) 2.5 MG TABS tablet Take 1 tablet (2.5 mg total) by mouth 2 (two) times daily. 60 tablet 11   No current facility-administered medications for this visit.     Past Medical History  Diagnosis Date  . Essential hypertension   . Gout   . GERD (gastroesophageal reflux disease)   . Insomnia   . Atrial fibrillation ([place])   . Type 2 diabetes mellitus ([place])    . Allergic rhinitis   . Hyperlipemia, mixed   . Chronic renal disease, stage IV (HCC)     ROS:   All systems reviewed and negative except as noted in the HPI.   Past Surgical History  Procedure Laterality Date  . Cesarean section      x2  . Esophageal dilation    . Permanent pacemaker insertion N/A 04/26/2014    Procedure: PERMANENT PACEMAKER INSERTION;  Surgeon: Evans Lance, MD;  Location: [facility] CATH LAB;  Service: Cardiovascular;  Laterality: N/A;  . Tee without cardioversion N/A 05/29/2014    Procedure: TRANSESOPHAGEAL ECHOCARDIOGRAM (TEE);  Surgeon: Fay Records, MD;  Location: [facility] ENDOSCOPY;  Service: Cardiovascular;  Laterality: N/A;  . Cardioversion N/A 05/29/2014    Procedure: CARDIOVERSION;  Surgeon: Fay Records, MD;  Location: [place];  Service: Cardiovascular;  Laterality: N/A;  . Cardioversion N/A 11/03/2014    Procedure: CARDIOVERSION;  Surgeon: Thayer Headings, MD;  Location: [facility] ENDOSCOPY;  Service: Cardiovascular;  Laterality: N/A;     Family History  Problem Relation Age of Onset  . Coronary artery disease Father 80  . Heart attack Father   . Congestive Heart Failure Mother   . Alzheimer's disease Mother  Social History   Social History  . Marital Status: Married    Spouse Name: N/A  . Number of Children: N/A  . Years of Education: N/A   Occupational History  . Not on file.   Social History Main Topics  . Smoking status: Never Smoker   . Smokeless tobacco: Never Used  . Alcohol Use: No  . Drug Use: No  . Sexual Activity: Not on file   Other Topics Concern  . Not on file   Social History Narrative   Lives in [place].  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     BP 158/76 mmHg  Pulse 68  Ht 5\' 3"  (1.6 m)  Wt 168 lb (76.204 kg)  BMI 29.77 kg/m2  Physical Exam:  Well appearing 80 yo woman, NAD HEENT: Unremarkable Neck:  6 cm JVD, no thyromegally Lymphatics:  No adenopathy Back:  No CVA tenderness Lungs:   Clear with no wheezes, rales, or rhonchi HEART:  Regular rate rhythm, no murmurs, no rubs, no clicks Abd:  soft, positive bowel sounds, no organomegally, no rebound, no guarding Ext:  2 plus pulses, no edema, no cyanosis, no clubbing Skin:  No rashes no nodules Neuro:  CN II through XII intact, motor grossly intact   DEVICE  Normal device function.  See PaceArt for details.   Assess/Plan:

## 2015-05-01 NOTE — Assessment & Plan Note (Signed)
She has white coat HTN. On my check today her blood pressure is 145/65. She will continue her current meds. She is encouraged to reduce her salt intake.

## 2015-05-01 NOTE — Patient Instructions (Signed)
Medication Instructions:  Your physician has recommended you make the following change in your medication:   1) Stop Warfarin 2) Start Eliquis 2.5 mg twice daily on Fri 05/04/15    Labwork: Your physician recommends that you return for lab work in: 4 weeks for a BMP   Testing/Procedures: None ordered   Follow-Up: Your physician wants you to follow-up in: 12 months with Dr Knox Saliva will receive a reminder letter in the mail two months in advance. If you don't receive a letter, please call our office to schedule the follow-up appointment.  Remote monitoring is used to monitor your Pacemaker  from home. This monitoring reduces the number of office visits required to check your device to one time per year. It allows [place] to keep an eye on the functioning of your device to ensure it is working properly. You are scheduled for a device check from home on 07/31/15. You may send your transmission at any time that day. If you have a wireless device, the transmission will be sent automatically. After your physician reviews your transmission, you will receive a postcard with your next transmission date.    Any Other Special Instructions Will Be Listed Below (If Applicable).     If you need a refill on your cardiac medications before your next appointment, please call your pharmacy.

## 2015-05-01 NOTE — Assessment & Plan Note (Signed)
She has maintained NSR nicely since her DCCV this summer. She is encouraged to continue her flecainide.

## 2015-05-01 NOTE — Assessment & Plan Note (Signed)
Her Medtronic MRI compatible DDD PM is working normally. Will recheck in several months.

## 2015-05-03 ENCOUNTER — Telehealth: Payer: Self-pay | Admitting: *Deleted

## 2015-05-03 NOTE — Telephone Encounter (Signed)
need prior auth on eliquis 2.5 mg, samples placed up front, pt aware

## 2015-05-08 ENCOUNTER — Telehealth: Payer: Self-pay

## 2015-05-08 NOTE — Telephone Encounter (Signed)
Prior auth for Eliquis 2.5 mg sent to [facility]

## 2015-05-09 ENCOUNTER — Telehealth: Payer: Self-pay

## 2015-05-09 NOTE — Telephone Encounter (Signed)
Eliquis approved through 05/07/2016. PA # [id number].

## 2015-05-29 ENCOUNTER — Other Ambulatory Visit (INDEPENDENT_AMBULATORY_CARE_PROVIDER_SITE_OTHER): Payer: Medicare Other | Admitting: *Deleted

## 2015-05-29 DIAGNOSIS — I10 Essential (primary) hypertension: Secondary | ICD-10-CM

## 2015-05-29 LAB — BASIC METABOLIC PANEL
BUN: 38 mg/dL — AB (ref 7–25)
CO2: 20 mmol/L (ref 20–31)
Calcium: 8.9 mg/dL (ref 8.6–10.4)
Chloride: 104 mmol/L (ref 98–110)
Creat: 1.65 mg/dL — ABNORMAL HIGH (ref 0.60–0.93)
Glucose, Bld: 195 mg/dL — ABNORMAL HIGH (ref 65–99)
POTASSIUM: 4.7 mmol/L (ref 3.5–5.3)
SODIUM: 138 mmol/L (ref 135–146)

## 2015-06-16 ENCOUNTER — Other Ambulatory Visit: Payer: Self-pay | Admitting: Internal Medicine

## 2015-06-16 ENCOUNTER — Other Ambulatory Visit: Payer: Self-pay | Admitting: Interventional Cardiology

## 2015-06-18 DIAGNOSIS — E559 Vitamin D deficiency, unspecified: Secondary | ICD-10-CM | POA: Diagnosis not present

## 2015-06-18 DIAGNOSIS — E785 Hyperlipidemia, unspecified: Secondary | ICD-10-CM | POA: Diagnosis not present

## 2015-06-18 DIAGNOSIS — I4891 Unspecified atrial fibrillation: Secondary | ICD-10-CM | POA: Diagnosis not present

## 2015-06-18 DIAGNOSIS — R609 Edema, unspecified: Secondary | ICD-10-CM | POA: Diagnosis not present

## 2015-06-18 DIAGNOSIS — Z7984 Long term (current) use of oral hypoglycemic drugs: Secondary | ICD-10-CM | POA: Diagnosis not present

## 2015-06-18 DIAGNOSIS — M109 Gout, unspecified: Secondary | ICD-10-CM | POA: Diagnosis not present

## 2015-06-18 DIAGNOSIS — N184 Chronic kidney disease, stage 4 (severe): Secondary | ICD-10-CM | POA: Diagnosis not present

## 2015-06-18 DIAGNOSIS — Z794 Long term (current) use of insulin: Secondary | ICD-10-CM | POA: Diagnosis not present

## 2015-06-18 DIAGNOSIS — E1122 Type 2 diabetes mellitus with diabetic chronic kidney disease: Secondary | ICD-10-CM | POA: Diagnosis not present

## 2015-06-18 DIAGNOSIS — E039 Hypothyroidism, unspecified: Secondary | ICD-10-CM | POA: Diagnosis not present

## 2015-06-27 DIAGNOSIS — Z23 Encounter for immunization: Secondary | ICD-10-CM | POA: Diagnosis not present

## 2015-06-27 DIAGNOSIS — D485 Neoplasm of uncertain behavior of skin: Secondary | ICD-10-CM | POA: Diagnosis not present

## 2015-06-27 DIAGNOSIS — C44319 Basal cell carcinoma of skin of other parts of face: Secondary | ICD-10-CM | POA: Diagnosis not present

## 2015-06-27 DIAGNOSIS — D1800 Hemangioma unspecified site: Secondary | ICD-10-CM | POA: Diagnosis not present

## 2015-06-27 DIAGNOSIS — L570 Actinic keratosis: Secondary | ICD-10-CM | POA: Diagnosis not present

## 2015-07-10 DIAGNOSIS — H6980 Other specified disorders of Eustachian tube, unspecified ear: Secondary | ICD-10-CM | POA: Diagnosis not present

## 2015-07-10 DIAGNOSIS — J069 Acute upper respiratory infection, unspecified: Secondary | ICD-10-CM | POA: Diagnosis not present

## 2015-07-16 DIAGNOSIS — E785 Hyperlipidemia, unspecified: Secondary | ICD-10-CM | POA: Diagnosis not present

## 2015-07-16 DIAGNOSIS — N183 Chronic kidney disease, stage 3 (moderate): Secondary | ICD-10-CM | POA: Diagnosis not present

## 2015-07-16 DIAGNOSIS — Z7901 Long term (current) use of anticoagulants: Secondary | ICD-10-CM | POA: Diagnosis not present

## 2015-07-16 DIAGNOSIS — I129 Hypertensive chronic kidney disease with stage 1 through stage 4 chronic kidney disease, or unspecified chronic kidney disease: Secondary | ICD-10-CM | POA: Diagnosis not present

## 2015-07-16 DIAGNOSIS — E119 Type 2 diabetes mellitus without complications: Secondary | ICD-10-CM | POA: Diagnosis not present

## 2015-07-16 DIAGNOSIS — Z79899 Other long term (current) drug therapy: Secondary | ICD-10-CM | POA: Diagnosis not present

## 2015-07-16 DIAGNOSIS — D631 Anemia in chronic kidney disease: Secondary | ICD-10-CM | POA: Diagnosis not present

## 2015-07-16 DIAGNOSIS — Z794 Long term (current) use of insulin: Secondary | ICD-10-CM | POA: Diagnosis not present

## 2015-07-16 DIAGNOSIS — E1122 Type 2 diabetes mellitus with diabetic chronic kidney disease: Secondary | ICD-10-CM | POA: Diagnosis not present

## 2015-07-16 DIAGNOSIS — R809 Proteinuria, unspecified: Secondary | ICD-10-CM | POA: Diagnosis not present

## 2015-07-16 DIAGNOSIS — G4700 Insomnia, unspecified: Secondary | ICD-10-CM | POA: Diagnosis not present

## 2015-07-19 ENCOUNTER — Other Ambulatory Visit: Payer: Self-pay

## 2015-07-19 ENCOUNTER — Ambulatory Visit: Payer: Self-pay | Admitting: Pharmacist

## 2015-07-19 DIAGNOSIS — Z1231 Encounter for screening mammogram for malignant neoplasm of breast: Secondary | ICD-10-CM

## 2015-07-19 DIAGNOSIS — Z5181 Encounter for therapeutic drug level monitoring: Secondary | ICD-10-CM

## 2015-07-19 DIAGNOSIS — I4819 Other persistent atrial fibrillation: Secondary | ICD-10-CM

## 2015-07-25 DIAGNOSIS — H6981 Other specified disorders of Eustachian tube, right ear: Secondary | ICD-10-CM | POA: Diagnosis not present

## 2015-07-25 DIAGNOSIS — R109 Unspecified abdominal pain: Secondary | ICD-10-CM | POA: Diagnosis not present

## 2015-07-31 ENCOUNTER — Ambulatory Visit (INDEPENDENT_AMBULATORY_CARE_PROVIDER_SITE_OTHER): Payer: Medicare Other | Admitting: *Deleted

## 2015-07-31 ENCOUNTER — Telehealth: Payer: Self-pay | Admitting: Cardiology

## 2015-07-31 DIAGNOSIS — I481 Persistent atrial fibrillation: Secondary | ICD-10-CM

## 2015-07-31 DIAGNOSIS — R001 Bradycardia, unspecified: Secondary | ICD-10-CM | POA: Diagnosis not present

## 2015-07-31 DIAGNOSIS — Z950 Presence of cardiac pacemaker: Secondary | ICD-10-CM

## 2015-07-31 DIAGNOSIS — I4819 Other persistent atrial fibrillation: Secondary | ICD-10-CM

## 2015-07-31 NOTE — Telephone Encounter (Signed)
LMOVM reminding pt to send remote transmission.   

## 2015-08-01 NOTE — Progress Notes (Signed)
Remote pacemaker transmission.   

## 2015-08-16 ENCOUNTER — Ambulatory Visit
Admission: RE | Admit: 2015-08-16 | Discharge: 2015-08-16 | Disposition: A | Discharge status: Home / Self Care | Payer: Medicare Other | Source: Ambulatory Visit

## 2015-08-16 DIAGNOSIS — Z1231 Encounter for screening mammogram for malignant neoplasm of breast: Secondary | ICD-10-CM | POA: Diagnosis not present

## 2015-08-28 ENCOUNTER — Telehealth: Payer: Self-pay | Admitting: Internal Medicine

## 2015-08-28 NOTE — Telephone Encounter (Signed)
Follow up  ° °Patient returning call back to nurse from today .  °

## 2015-08-28 NOTE — Telephone Encounter (Signed)
Spoke with patient and she can continue.

## 2015-08-28 NOTE — Telephone Encounter (Signed)
Left message on machine for patient to return my call.  Discussed with Dr Lovena Le, if she needs to stop may hold for 2 days prior and restart when MD remove skin cancer feels appropriate.  If the MD doing procedure has not asked her to stop would stay on medication.

## 2015-08-28 NOTE — Telephone Encounter (Signed)
Pt called in stating that she will be having skin cancer surgery on 4/20 and she wanted to make sure that she did not need to adjusted her Eliquis medication before going in to surgery. Please f/u with her

## 2015-08-29 LAB — CUP PACEART REMOTE DEVICE CHECK
Battery Remaining Longevity: 106 mo
Battery Voltage: 3.02 V
Brady Statistic AP VP Percent: 0.09 %
Brady Statistic AP VS Percent: 99.86 %
Brady Statistic AS VP Percent: 0 %
Brady Statistic AS VS Percent: 0.05 %
Date Time Interrogation Session: 20170321165841
Implantable Lead Implant Date: 20151217
Implantable Lead Implant Date: 20151217
Implantable Lead Location: 753859
Implantable Lead Model: 5076
Lead Channel Impedance Value: 342 Ohm
Lead Channel Impedance Value: 437 Ohm
Lead Channel Impedance Value: 456 Ohm
Lead Channel Pacing Threshold Amplitude: 0.875 V
Lead Channel Pacing Threshold Amplitude: 1.125 V
Lead Channel Pacing Threshold Pulse Width: 0.4 ms
Lead Channel Sensing Intrinsic Amplitude: 1.25 mV
Lead Channel Sensing Intrinsic Amplitude: 13.125 mV
Lead Channel Setting Pacing Amplitude: 2.25 V
Lead Channel Setting Pacing Amplitude: 2.5 V
Lead Channel Setting Pacing Pulse Width: 0.4 ms
MDC IDC LEAD LOCATION: 753860
MDC IDC MSMT LEADCHNL RA PACING THRESHOLD PULSEWIDTH: 0.4 ms
MDC IDC MSMT LEADCHNL RA SENSING INTR AMPL: 1.25 mV
MDC IDC MSMT LEADCHNL RV IMPEDANCE VALUE: 475 Ohm
MDC IDC MSMT LEADCHNL RV SENSING INTR AMPL: 13.125 mV
MDC IDC SET LEADCHNL RV SENSING SENSITIVITY: 0.9 mV
MDC IDC STAT BRADY RA PERCENT PACED: 99.95 %
MDC IDC STAT BRADY RV PERCENT PACED: 0.09 %

## 2015-08-30 DIAGNOSIS — C44319 Basal cell carcinoma of skin of other parts of face: Secondary | ICD-10-CM | POA: Diagnosis not present

## 2015-08-31 ENCOUNTER — Encounter: Payer: Self-pay | Admitting: Cardiology

## 2015-09-18 DIAGNOSIS — T8189XD Other complications of procedures, not elsewhere classified, subsequent encounter: Secondary | ICD-10-CM | POA: Diagnosis not present

## 2015-10-10 DIAGNOSIS — Z85828 Personal history of other malignant neoplasm of skin: Secondary | ICD-10-CM | POA: Diagnosis not present

## 2015-10-10 DIAGNOSIS — D485 Neoplasm of uncertain behavior of skin: Secondary | ICD-10-CM | POA: Diagnosis not present

## 2015-10-10 DIAGNOSIS — L570 Actinic keratosis: Secondary | ICD-10-CM | POA: Diagnosis not present

## 2015-10-10 DIAGNOSIS — C4441 Basal cell carcinoma of skin of scalp and neck: Secondary | ICD-10-CM | POA: Diagnosis not present

## 2015-10-10 DIAGNOSIS — D225 Melanocytic nevi of trunk: Secondary | ICD-10-CM | POA: Diagnosis not present

## 2015-10-27 ENCOUNTER — Other Ambulatory Visit: Payer: Self-pay | Admitting: Physician Assistant

## 2015-10-30 ENCOUNTER — Ambulatory Visit (INDEPENDENT_AMBULATORY_CARE_PROVIDER_SITE_OTHER): Payer: Medicare Other | Admitting: *Deleted

## 2015-10-30 DIAGNOSIS — R001 Bradycardia, unspecified: Secondary | ICD-10-CM | POA: Diagnosis not present

## 2015-10-30 NOTE — Progress Notes (Signed)
Remote pacemaker transmission.   

## 2015-10-31 LAB — CUP PACEART REMOTE DEVICE CHECK
Battery Voltage: 3.01 V
Brady Statistic AP VP Percent: 0.09 %
Brady Statistic AS VP Percent: 0 %
Brady Statistic AS VS Percent: 0.04 %
Brady Statistic RA Percent Paced: 99.96 %
Date Time Interrogation Session: 20170620142742
Implantable Lead Implant Date: 20151217
Implantable Lead Implant Date: 20151217
Implantable Lead Location: 753859
Implantable Lead Model: 5076
Lead Channel Impedance Value: 437 Ohm
Lead Channel Impedance Value: 456 Ohm
Lead Channel Pacing Threshold Amplitude: 0.875 V
Lead Channel Pacing Threshold Amplitude: 1.125 V
Lead Channel Sensing Intrinsic Amplitude: 12.75 mV
Lead Channel Setting Pacing Amplitude: 2.25 V
Lead Channel Setting Pacing Pulse Width: 0.4 ms
MDC IDC LEAD LOCATION: 753860
MDC IDC MSMT BATTERY REMAINING LONGEVITY: 103 mo
MDC IDC MSMT LEADCHNL RA IMPEDANCE VALUE: 342 Ohm
MDC IDC MSMT LEADCHNL RA PACING THRESHOLD PULSEWIDTH: 0.4 ms
MDC IDC MSMT LEADCHNL RA SENSING INTR AMPL: 1.125 mV
MDC IDC MSMT LEADCHNL RA SENSING INTR AMPL: 1.125 mV
MDC IDC MSMT LEADCHNL RV IMPEDANCE VALUE: 475 Ohm
MDC IDC MSMT LEADCHNL RV PACING THRESHOLD PULSEWIDTH: 0.4 ms
MDC IDC MSMT LEADCHNL RV SENSING INTR AMPL: 12.75 mV
MDC IDC SET LEADCHNL RV PACING AMPLITUDE: 2.5 V
MDC IDC SET LEADCHNL RV SENSING SENSITIVITY: 0.9 mV
MDC IDC STAT BRADY AP VS PERCENT: 99.87 %
MDC IDC STAT BRADY RV PERCENT PACED: 0.09 %

## 2015-11-02 ENCOUNTER — Encounter: Payer: Self-pay | Admitting: Cardiology

## 2015-11-30 DIAGNOSIS — Z794 Long term (current) use of insulin: Secondary | ICD-10-CM | POA: Diagnosis not present

## 2015-11-30 DIAGNOSIS — E1122 Type 2 diabetes mellitus with diabetic chronic kidney disease: Secondary | ICD-10-CM | POA: Diagnosis not present

## 2015-11-30 DIAGNOSIS — N184 Chronic kidney disease, stage 4 (severe): Secondary | ICD-10-CM | POA: Diagnosis not present

## 2015-12-06 DIAGNOSIS — C4441 Basal cell carcinoma of skin of scalp and neck: Secondary | ICD-10-CM | POA: Diagnosis not present

## 2015-12-06 DIAGNOSIS — D485 Neoplasm of uncertain behavior of skin: Secondary | ICD-10-CM | POA: Diagnosis not present

## 2015-12-27 DIAGNOSIS — R609 Edema, unspecified: Secondary | ICD-10-CM | POA: Diagnosis not present

## 2015-12-27 DIAGNOSIS — E559 Vitamin D deficiency, unspecified: Secondary | ICD-10-CM | POA: Diagnosis not present

## 2015-12-27 DIAGNOSIS — E785 Hyperlipidemia, unspecified: Secondary | ICD-10-CM | POA: Diagnosis not present

## 2015-12-27 DIAGNOSIS — I4891 Unspecified atrial fibrillation: Secondary | ICD-10-CM | POA: Diagnosis not present

## 2015-12-27 DIAGNOSIS — E1122 Type 2 diabetes mellitus with diabetic chronic kidney disease: Secondary | ICD-10-CM | POA: Diagnosis not present

## 2015-12-27 DIAGNOSIS — N184 Chronic kidney disease, stage 4 (severe): Secondary | ICD-10-CM | POA: Diagnosis not present

## 2015-12-27 DIAGNOSIS — Z6832 Body mass index (BMI) 32.0-32.9, adult: Secondary | ICD-10-CM | POA: Diagnosis not present

## 2015-12-27 DIAGNOSIS — M109 Gout, unspecified: Secondary | ICD-10-CM | POA: Diagnosis not present

## 2015-12-27 DIAGNOSIS — E039 Hypothyroidism, unspecified: Secondary | ICD-10-CM | POA: Diagnosis not present

## 2016-01-16 DIAGNOSIS — N183 Chronic kidney disease, stage 3 (moderate): Secondary | ICD-10-CM | POA: Diagnosis not present

## 2016-01-16 DIAGNOSIS — E039 Hypothyroidism, unspecified: Secondary | ICD-10-CM | POA: Diagnosis not present

## 2016-01-16 DIAGNOSIS — I129 Hypertensive chronic kidney disease with stage 1 through stage 4 chronic kidney disease, or unspecified chronic kidney disease: Secondary | ICD-10-CM | POA: Diagnosis not present

## 2016-01-16 DIAGNOSIS — E785 Hyperlipidemia, unspecified: Secondary | ICD-10-CM | POA: Diagnosis not present

## 2016-01-16 DIAGNOSIS — R809 Proteinuria, unspecified: Secondary | ICD-10-CM | POA: Diagnosis not present

## 2016-01-16 DIAGNOSIS — Z794 Long term (current) use of insulin: Secondary | ICD-10-CM | POA: Diagnosis not present

## 2016-01-16 DIAGNOSIS — Z7901 Long term (current) use of anticoagulants: Secondary | ICD-10-CM | POA: Diagnosis not present

## 2016-01-16 DIAGNOSIS — D631 Anemia in chronic kidney disease: Secondary | ICD-10-CM | POA: Diagnosis not present

## 2016-01-16 DIAGNOSIS — E1122 Type 2 diabetes mellitus with diabetic chronic kidney disease: Secondary | ICD-10-CM | POA: Diagnosis not present

## 2016-01-16 DIAGNOSIS — I4891 Unspecified atrial fibrillation: Secondary | ICD-10-CM | POA: Diagnosis not present

## 2016-01-16 DIAGNOSIS — Z79899 Other long term (current) drug therapy: Secondary | ICD-10-CM | POA: Diagnosis not present

## 2016-01-16 DIAGNOSIS — Z7982 Long term (current) use of aspirin: Secondary | ICD-10-CM | POA: Diagnosis not present

## 2016-01-16 DIAGNOSIS — G4700 Insomnia, unspecified: Secondary | ICD-10-CM | POA: Diagnosis not present

## 2016-01-19 IMAGING — CR DG CHEST 1V PORT
1 series · 1 of 1 positions shown · non-contrast
Comparison: 09/09/2010

CLINICAL DATA: Initial encounter. Atrial fibrillation. Near
syncope. Weakness.

EXAM:
PORTABLE CHEST - 1 VIEW

[AP]
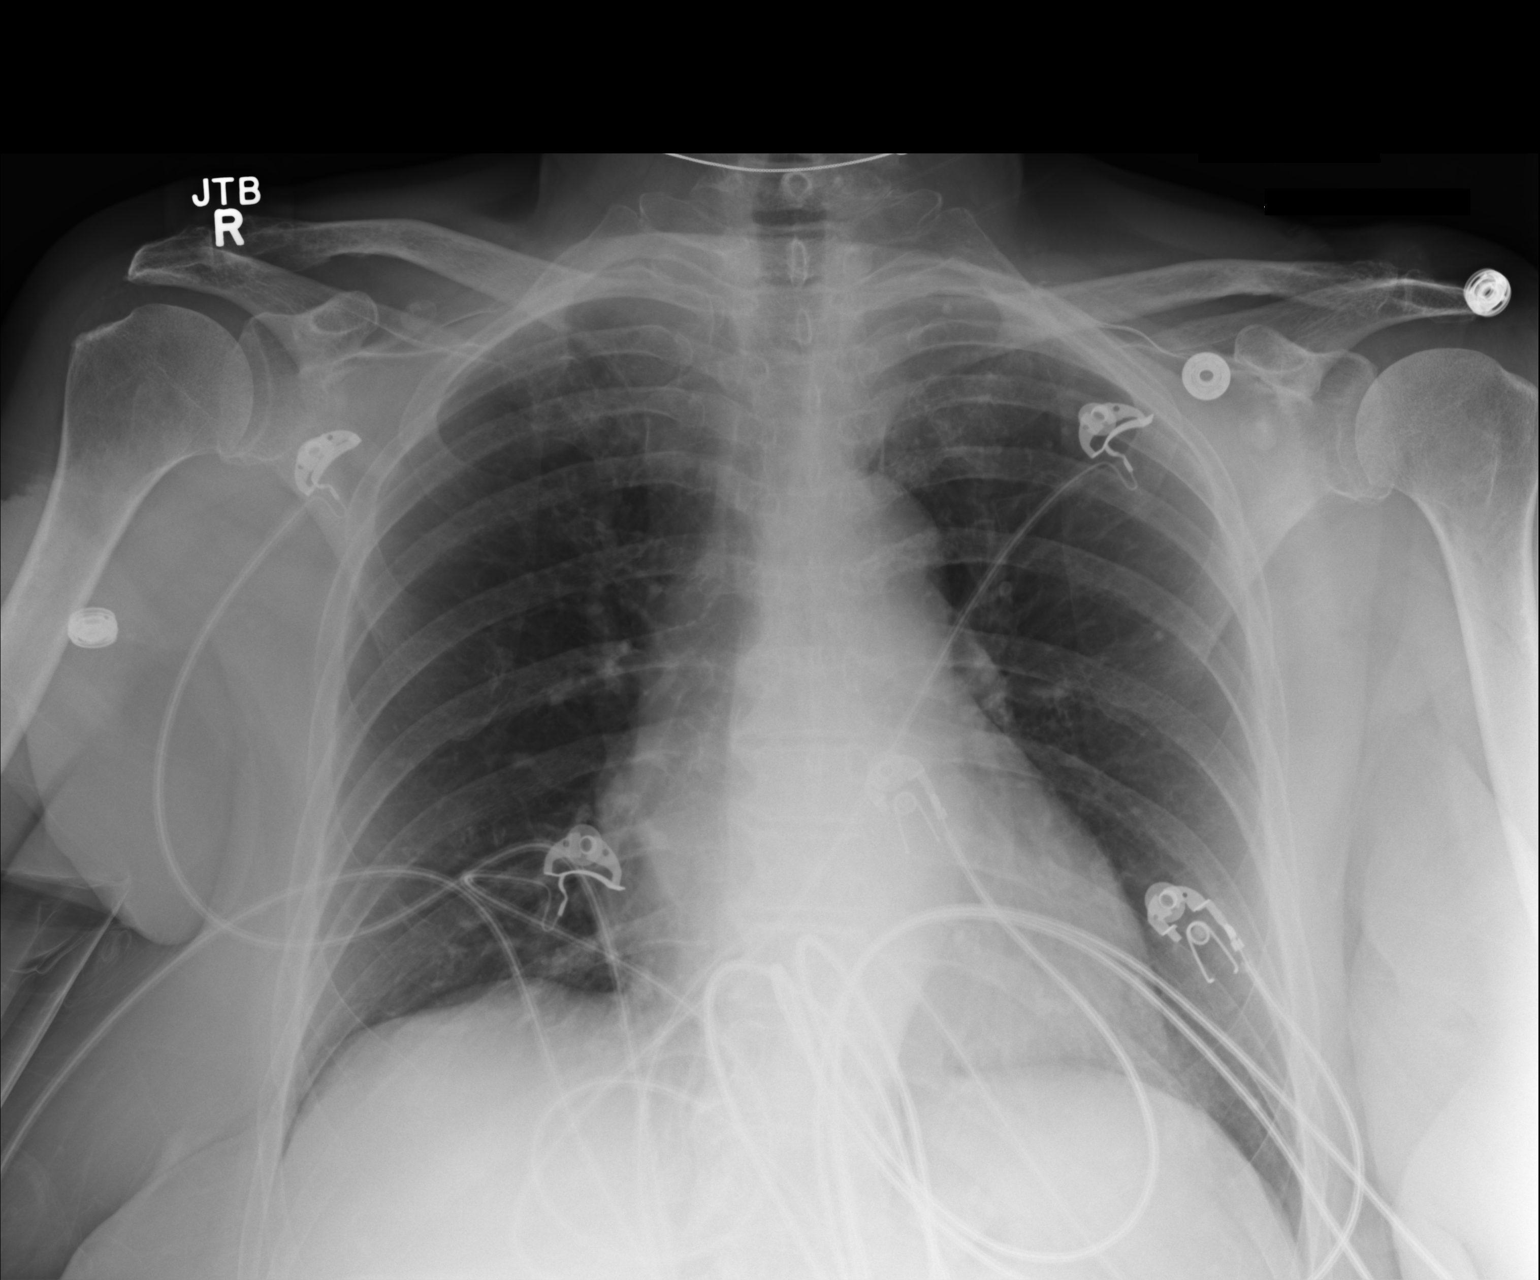

[1 of 1 positions shown; findings below may reference images not displayed]

FINDINGS: The heart size and mediastinal contours are within normal limits.
Both lungs are clear. The visualized skeletal structures are
unremarkable.
IMPRESSION: No active disease.

## 2016-01-21 IMAGING — DX DG CHEST 2V
2 series · 2 of 2 positions shown · non-contrast
Comparison: 04/25/2014.

CLINICAL DATA: Pacemaker.

EXAM:
CHEST  2 VIEW

[chest pa]
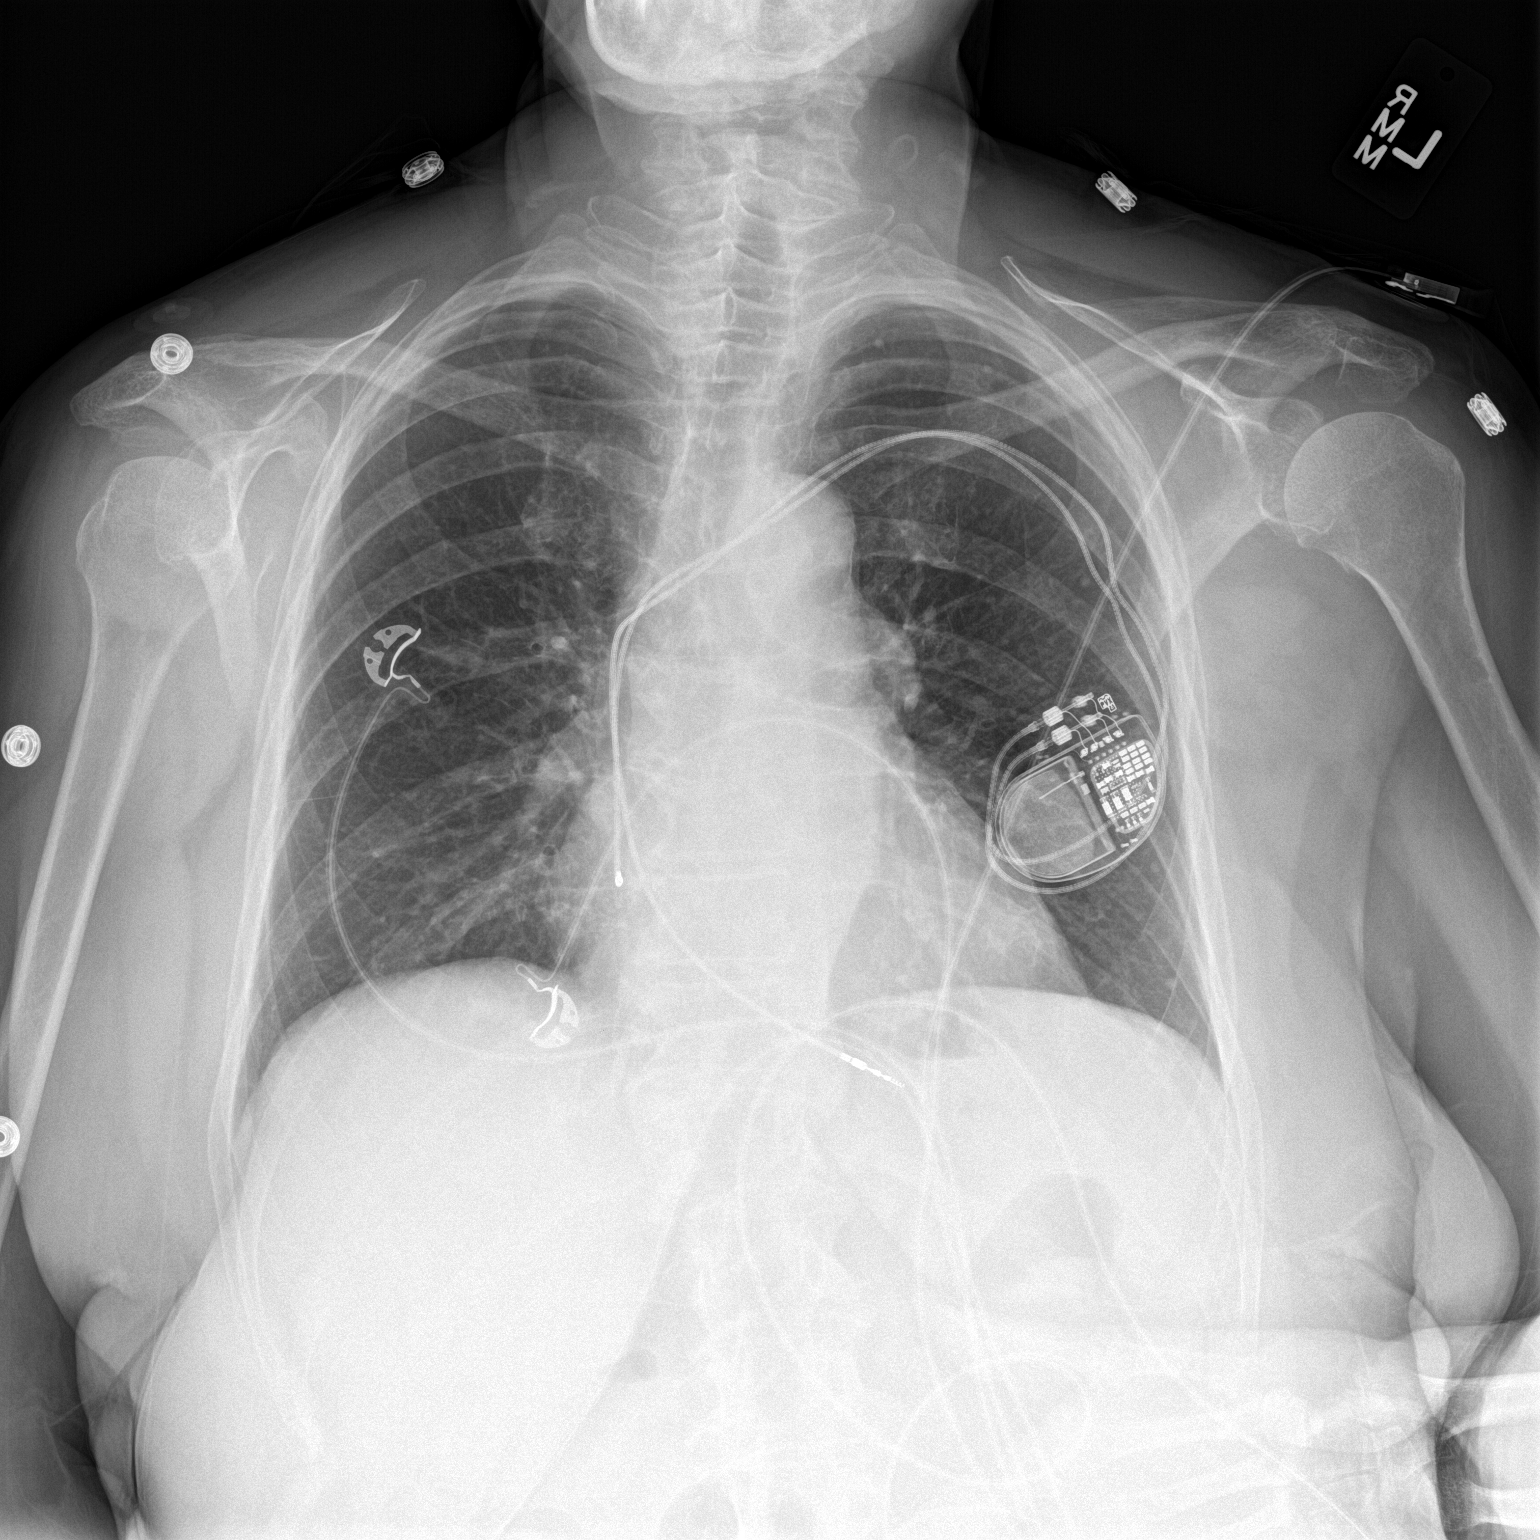

[chest lat]
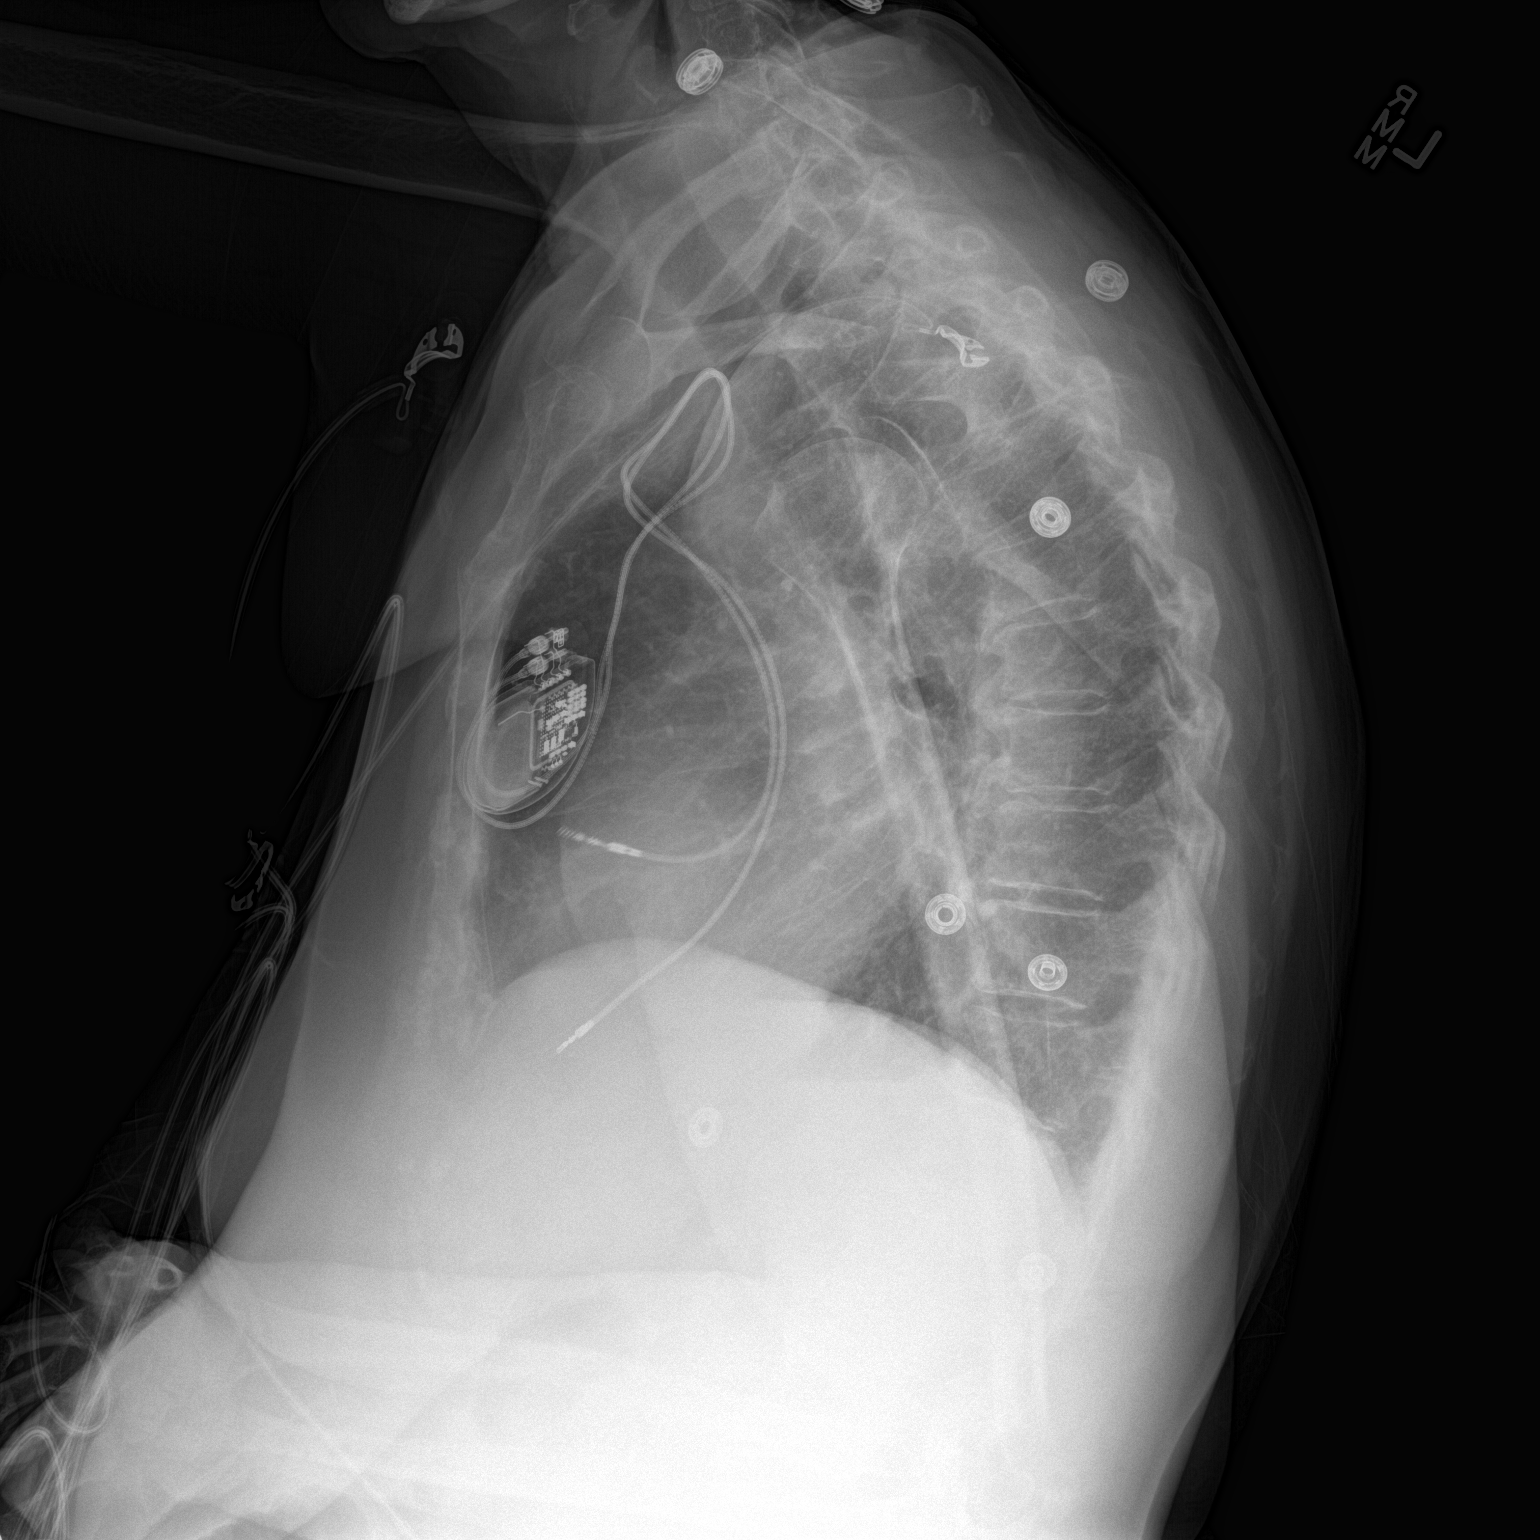

[2 of 2 positions shown; findings below may reference images not displayed]

FINDINGS: Mediastinum and hilar structures normal. Heart size normal. Cardiac
pacer noted with lead tips in the right atrium and right ventricle.
Heart size normal. Pulmonary vascularity normal. No focal
infiltrate. No pleural effusion or pneumothorax. Tiny calcified
pulmonary nodules on the left consistent granulomas. No acute
osseous abnormality .
IMPRESSION: 1. Cardiac pacer noted with lead tips in the right atrium and right
ventricle.
2. No acute cardiopulmonary disease identified. Tiny calcified
pulmonary nodules on the left consistent with granulomas.

## 2016-01-29 ENCOUNTER — Ambulatory Visit (INDEPENDENT_AMBULATORY_CARE_PROVIDER_SITE_OTHER): Payer: Medicare Other | Admitting: *Deleted

## 2016-01-29 ENCOUNTER — Telehealth: Payer: Self-pay | Admitting: Cardiology

## 2016-01-29 DIAGNOSIS — R001 Bradycardia, unspecified: Secondary | ICD-10-CM | POA: Diagnosis not present

## 2016-01-29 NOTE — Progress Notes (Signed)
Remote pacemaker transmission.   

## 2016-01-29 NOTE — Telephone Encounter (Signed)
Spoke with pt and reminded pt of remote transmission that is due today. Pt verbalized understanding.   

## 2016-01-30 ENCOUNTER — Encounter: Payer: Self-pay | Admitting: Cardiology

## 2016-02-18 LAB — CUP PACEART REMOTE DEVICE CHECK
Battery Remaining Longevity: 92 mo
Brady Statistic AP VP Percent: 0.11 %
Brady Statistic AP VS Percent: 99.85 %
Brady Statistic AS VP Percent: 0 %
Brady Statistic AS VS Percent: 0.04 %
Implantable Lead Implant Date: 20151217
Implantable Lead Location: 753859
Lead Channel Impedance Value: 437 Ohm
Lead Channel Impedance Value: 437 Ohm
Lead Channel Pacing Threshold Amplitude: 0.875 V
Lead Channel Sensing Intrinsic Amplitude: 0.875 mV
Lead Channel Sensing Intrinsic Amplitude: 12.875 mV
Lead Channel Sensing Intrinsic Amplitude: 12.875 mV
Lead Channel Setting Pacing Amplitude: 2 V
Lead Channel Setting Pacing Amplitude: 2.5 V
MDC IDC LEAD IMPLANT DT: 20151217
MDC IDC LEAD LOCATION: 753860
MDC IDC MSMT BATTERY VOLTAGE: 3.01 V
MDC IDC MSMT LEADCHNL RA IMPEDANCE VALUE: 342 Ohm
MDC IDC MSMT LEADCHNL RA PACING THRESHOLD AMPLITUDE: 1 V
MDC IDC MSMT LEADCHNL RA PACING THRESHOLD PULSEWIDTH: 0.4 ms
MDC IDC MSMT LEADCHNL RA SENSING INTR AMPL: 0.875 mV
MDC IDC MSMT LEADCHNL RV IMPEDANCE VALUE: 475 Ohm
MDC IDC MSMT LEADCHNL RV PACING THRESHOLD PULSEWIDTH: 0.4 ms
MDC IDC SESS DTM: 20170919183515
MDC IDC SET LEADCHNL RV PACING PULSEWIDTH: 0.4 ms
MDC IDC SET LEADCHNL RV SENSING SENSITIVITY: 0.9 mV
MDC IDC STAT BRADY RA PERCENT PACED: 99.96 %
MDC IDC STAT BRADY RV PERCENT PACED: 0.11 %

## 2016-03-05 DIAGNOSIS — Z23 Encounter for immunization: Secondary | ICD-10-CM | POA: Diagnosis not present

## 2016-03-05 DIAGNOSIS — N184 Chronic kidney disease, stage 4 (severe): Secondary | ICD-10-CM | POA: Diagnosis not present

## 2016-03-05 DIAGNOSIS — Z794 Long term (current) use of insulin: Secondary | ICD-10-CM | POA: Diagnosis not present

## 2016-03-05 DIAGNOSIS — E1122 Type 2 diabetes mellitus with diabetic chronic kidney disease: Secondary | ICD-10-CM | POA: Diagnosis not present

## 2016-03-31 DIAGNOSIS — M109 Gout, unspecified: Secondary | ICD-10-CM | POA: Diagnosis not present

## 2016-04-09 ENCOUNTER — Encounter (HOSPITAL_COMMUNITY): Payer: Self-pay | Admitting: Nurse Practitioner

## 2016-04-09 ENCOUNTER — Ambulatory Visit (HOSPITAL_COMMUNITY)
Admission: RE | Admit: 2016-04-09 | Discharge: 2016-04-09 | Disposition: A | Discharge status: Home / Self Care | Payer: Medicare Other | Source: Ambulatory Visit | Attending: Nurse Practitioner | Admitting: Nurse Practitioner

## 2016-04-09 ENCOUNTER — Other Ambulatory Visit (HOSPITAL_COMMUNITY): Payer: Self-pay | Admitting: *Deleted

## 2016-04-09 VITALS — BP 124/82 | HR 93 | Ht 63.0 in | Wt 174.4 lb

## 2016-04-09 DIAGNOSIS — K219 Gastro-esophageal reflux disease without esophagitis: Secondary | ICD-10-CM | POA: Diagnosis not present

## 2016-04-09 DIAGNOSIS — E782 Mixed hyperlipidemia: Secondary | ICD-10-CM | POA: Insufficient documentation

## 2016-04-09 DIAGNOSIS — M109 Gout, unspecified: Secondary | ICD-10-CM | POA: Diagnosis not present

## 2016-04-09 DIAGNOSIS — Z8249 Family history of ischemic heart disease and other diseases of the circulatory system: Secondary | ICD-10-CM | POA: Insufficient documentation

## 2016-04-09 DIAGNOSIS — Z888 Allergy status to other drugs, medicaments and biological substances status: Secondary | ICD-10-CM | POA: Diagnosis not present

## 2016-04-09 DIAGNOSIS — I481 Persistent atrial fibrillation: Secondary | ICD-10-CM | POA: Diagnosis not present

## 2016-04-09 DIAGNOSIS — Z794 Long term (current) use of insulin: Secondary | ICD-10-CM | POA: Diagnosis not present

## 2016-04-09 DIAGNOSIS — I480 Paroxysmal atrial fibrillation: Secondary | ICD-10-CM | POA: Diagnosis not present

## 2016-04-09 DIAGNOSIS — E1122 Type 2 diabetes mellitus with diabetic chronic kidney disease: Secondary | ICD-10-CM | POA: Insufficient documentation

## 2016-04-09 DIAGNOSIS — I129 Hypertensive chronic kidney disease with stage 1 through stage 4 chronic kidney disease, or unspecified chronic kidney disease: Secondary | ICD-10-CM | POA: Insufficient documentation

## 2016-04-09 DIAGNOSIS — G4700 Insomnia, unspecified: Secondary | ICD-10-CM | POA: Insufficient documentation

## 2016-04-09 DIAGNOSIS — N184 Chronic kidney disease, stage 4 (severe): Secondary | ICD-10-CM | POA: Insufficient documentation

## 2016-04-09 DIAGNOSIS — Z950 Presence of cardiac pacemaker: Secondary | ICD-10-CM | POA: Insufficient documentation

## 2016-04-09 DIAGNOSIS — I4819 Other persistent atrial fibrillation: Secondary | ICD-10-CM

## 2016-04-09 DIAGNOSIS — Z7901 Long term (current) use of anticoagulants: Secondary | ICD-10-CM | POA: Diagnosis not present

## 2016-04-09 DIAGNOSIS — Z79899 Other long term (current) drug therapy: Secondary | ICD-10-CM | POA: Diagnosis not present

## 2016-04-09 LAB — BASIC METABOLIC PANEL
Anion gap: 10 (ref 5–15)
BUN: 23 mg/dL — AB (ref 6–20)
CALCIUM: 9 mg/dL (ref 8.9–10.3)
CO2: 25 mmol/L (ref 22–32)
Chloride: 101 mmol/L (ref 101–111)
Creatinine, Ser: 1.62 mg/dL — ABNORMAL HIGH (ref 0.44–1.00)
GFR calc Af Amer: 34 mL/min — ABNORMAL LOW (ref >60)
GFR, EST NON AFRICAN AMERICAN: 29 mL/min — AB (ref >60)
GLUCOSE: 130 mg/dL — AB (ref 65–99)
POTASSIUM: 3.5 mmol/L (ref 3.5–5.1)
Sodium: 136 mmol/L (ref 135–145)

## 2016-04-09 LAB — CBC
HEMATOCRIT: 45.6 % (ref 36.0–46.0)
Hemoglobin: 15.6 g/dL — ABNORMAL HIGH (ref 12.0–15.0)
MCH: 32.7 pg (ref 26.0–34.0)
MCHC: 34.2 g/dL (ref 30.0–36.0)
MCV: 95.6 fL (ref 78.0–100.0)
PLATELETS: 250 10*3/uL (ref 150–400)
RBC: 4.77 MIL/uL (ref 3.87–5.11)
RDW: 13 % (ref 11.5–15.5)
WBC: 12 10*3/uL — ABNORMAL HIGH (ref 4.0–10.5)

## 2016-04-09 LAB — TSH: TSH: 1.316 u[IU]/mL (ref 0.350–4.500)

## 2016-04-09 NOTE — Patient Instructions (Signed)
Cardioversion scheduled for Monday, December 4th  - Arrive at the [facility] and go to admitting at 8:30AM  -Do not eat or drink anything after midnight the night prior to your procedure.  - Take all your medication with a sip of water prior to arrival.  - You will not be able to drive home after your procedure.

## 2016-04-09 NOTE — Progress Notes (Signed)
Patient ID: Kathryn Richardson, female   DOB: 1935-04-07, 81 y.o.   MRN: [id number]      Primary Care Physician: Gara Kroner, MD Referring Physician:  Dr Irish Lack EP: Dr. Haydee Salter is a 81 y.o. female with a h/o persistent atrial fibrillation and bradycardia  receiving a PPM 12/16 for syncope, thought maybe caused from 1:1 aflutter.  She was discharged in afib on flecainide 50 mg  and diltiazem 240 mg qd was added for rate control.  However she remained in afib. Flecainide was increased to 100 mg bid without converting pt to SR and level of flecainide was  high on this dose, so she was reduced to 75 mg bid. She presented for cardioversion in January 2016 but DCCV was cancelled due to hyperglycemia and she was hospitalized. DCCV was successfully performed later after blood sugar stabilized. She has maintained SR until just recently.  She asked to be seen in the afib clinic 6/21 due to being in afib since the previousThursday. She has been compliant with flecainide and coumadin. She has been out of town in [place] attending the funeral of her 38 year old mother in law and thinks maybe the stress of this caused her to slip into afib. She is mildly symptomatic with fatigue and dyspnea.   She was set up for DCCV 10/2414 which was successful. She returns today having maintained SR and feeling well.  Being seen in [place] clinic 04/09/16. Pt finished prednisone taper Friday for treatment of gout and noted presence of afib on Saturday and has had weakness/fatigue since then. She has not missed any doses of eliquis .  Today, she denies symptoms of  chest pain , orthopnea, PND, lower extremity edema, presyncope, syncope, or neurologic sequela.Positive for fatigue.The patient is tolerating medications without difficulties and is otherwise without complaint today.   Past Medical History:  Diagnosis Date  . Allergic rhinitis   . Atrial fibrillation ([place])   . Chronic renal disease, stage IV ([place])   .  Essential hypertension   . GERD (gastroesophageal reflux disease)   . Gout   . Hyperlipemia, mixed   . Insomnia   . Type 2 diabetes mellitus ([place])    Past Surgical History:  Procedure Laterality Date  . CARDIOVERSION N/A 05/29/2014   Procedure: CARDIOVERSION;  Surgeon: Fay Records, MD;  Location: [facility] ENDOSCOPY;  Service: Cardiovascular;  Laterality: N/A;  . CARDIOVERSION N/A 11/03/2014   Procedure: CARDIOVERSION;  Surgeon: Thayer Headings, MD;  Location: [place];  Service: Cardiovascular;  Laterality: N/A;  . CESAREAN SECTION     x2  . ESOPHAGEAL DILATION    . PERMANENT PACEMAKER INSERTION N/A 04/26/2014   Procedure: PERMANENT PACEMAKER INSERTION;  Surgeon: Evans Lance, MD;  Location: [facility] CATH LAB;  Service: Cardiovascular;  Laterality: N/A;  . TEE WITHOUT CARDIOVERSION N/A 05/29/2014   Procedure: TRANSESOPHAGEAL ECHOCARDIOGRAM (TEE);  Surgeon: Fay Records, MD;  Location: [facility] ENDOSCOPY;  Service: Cardiovascular;  Laterality: N/A;    Current Outpatient Prescriptions  Medication Sig Dispense Refill  . allopurinol (ZYLOPRIM) 100 MG tablet Take 100 mg by mouth at bedtime.    Marland Kitchen apixaban (ELIQUIS) 2.5 MG TABS tablet Take 1 tablet (2.5 mg total) by mouth 2 (two) times daily. 60 tablet 11  . atorvastatin (LIPITOR) 80 MG tablet Take 80 mg by mouth at bedtime.     . benazepril (LOTENSIN) 20 MG tablet Take 20 mg by mouth daily.    Marland Kitchen diltiazem (CARDIZEM CD) 240  MG 24 hr capsule Take 1 capsule (240 mg total) by mouth daily. 90 capsule 2  . flecainide (TAMBOCOR) 50 MG tablet TAKE 1 & 1/2 TABLETS BY MOUTH EVERY 12 HOURS 90 tablet 9  . furosemide (LASIX) 40 MG tablet Take 40 mg by mouth daily.    . Insulin Detemir (LEVEMIR [place]) Inject into the skin. 36 UNITS IN THE MORNING    . KLOR-CON M20 20 MEQ tablet Take 20 mEq by mouth daily.  3  . levothyroxine (SYNTHROID, LEVOTHROID) 50 MCG tablet Take 50 mcg by mouth daily. On a empty stomach.    . repaglinide (PRANDIN) 0.5 MG tablet Take 0.5 mg by  mouth 2 (two) times daily before a meal.     No current facility-administered medications for this encounter.     Allergies  Allergen Reactions  . Cortisone Other (See Comments)    Hyperglycemia   . Prednisone Other (See Comments)    Hyperglycemia     Social History   Social History  . Marital status: Married    Spouse name: N/A  . Number of children: N/A  . Years of education: N/A   Occupational History  . Not on file.   Social History Main Topics  . Smoking status: Never Smoker  . Smokeless tobacco: Never Used  . Alcohol use No  . Drug use: No  . Sexual activity: Not on file   Other Topics Concern  . Not on file   Social History Narrative   Lives in [place].  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 Coronary artery disease Father 81  . Heart attack Father   . Congestive Heart Failure Mother   . Alzheimer's disease Mother    Physical Exam: Vitals:   04/09/16 1415  BP: 124/82  BP Location: Left Arm  Patient Position: Sitting  Cuff Size: Normal  Pulse: 93  Weight: 174 lb 6.4 oz (79.1 kg)  Height: 5\' 3"  (1.6 m)    GEN- The patient is well appearing, alert and oriented x 3 today.   Head- normocephalic, atraumatic Eyes-  Sclera clear, conjunctiva pink Ears- hearing intact Oropharynx- clear Neck- supple, no JVP Lymph- no cervical lymphadenopathy Lungs- Clear to ausculation bilaterally, normal work of breathing Heart-irregular rate and rhythm, no murmurs, rubs or gallops, PMI not laterally displaced. Pacer site well healed. GI- soft, NT, ND, + BS Extremities- no clubbing, cyanosis, or edema   EKG -afib at 93 bpm, qrs int 102 ms, qtc 457 ms Epic records reviewed  Assessment and Plan:  1.Symptomatic Persistent afib   Had maintained SR until recent prednisone taper Continue flecainide, cardizem NO MISSED DOSES OF ELIQUIS Will schedule for DCCV Update echo  2. PPM  Pt is  scheduled to f/u with Dr. Lovena Le in December  3. HTN  Stable today.  F/u in afib clinic one week after cardioversion  Butch Penny C. Carroll, [facility] [address] [place], [place] [address] [phone]

## 2016-04-12 ENCOUNTER — Other Ambulatory Visit: Payer: Self-pay | Admitting: Internal Medicine

## 2016-04-14 ENCOUNTER — Encounter (HOSPITAL_COMMUNITY): Payer: Self-pay | Admitting: *Deleted

## 2016-04-14 ENCOUNTER — Ambulatory Visit (HOSPITAL_COMMUNITY): Payer: Medicare Other | Admitting: Certified Registered Nurse Anesthetist

## 2016-04-14 ENCOUNTER — Encounter (HOSPITAL_COMMUNITY)
Admission: RE | Disposition: A | Discharge status: Home / Self Care | Payer: Self-pay | Source: Ambulatory Visit | Attending: Cardiology

## 2016-04-14 ENCOUNTER — Ambulatory Visit (HOSPITAL_COMMUNITY)
Admission: RE | Admit: 2016-04-14 | Discharge: 2016-04-14 | Disposition: A | Discharge status: Home / Self Care | Payer: Medicare Other | Source: Ambulatory Visit | Attending: Cardiology | Admitting: Cardiology

## 2016-04-14 DIAGNOSIS — Z7901 Long term (current) use of anticoagulants: Secondary | ICD-10-CM | POA: Diagnosis not present

## 2016-04-14 DIAGNOSIS — N184 Chronic kidney disease, stage 4 (severe): Secondary | ICD-10-CM | POA: Insufficient documentation

## 2016-04-14 DIAGNOSIS — Z794 Long term (current) use of insulin: Secondary | ICD-10-CM | POA: Insufficient documentation

## 2016-04-14 DIAGNOSIS — I481 Persistent atrial fibrillation: Secondary | ICD-10-CM | POA: Diagnosis not present

## 2016-04-14 DIAGNOSIS — K219 Gastro-esophageal reflux disease without esophagitis: Secondary | ICD-10-CM | POA: Insufficient documentation

## 2016-04-14 DIAGNOSIS — E782 Mixed hyperlipidemia: Secondary | ICD-10-CM | POA: Insufficient documentation

## 2016-04-14 DIAGNOSIS — I129 Hypertensive chronic kidney disease with stage 1 through stage 4 chronic kidney disease, or unspecified chronic kidney disease: Secondary | ICD-10-CM | POA: Insufficient documentation

## 2016-04-14 DIAGNOSIS — I480 Paroxysmal atrial fibrillation: Secondary | ICD-10-CM | POA: Diagnosis not present

## 2016-04-14 DIAGNOSIS — E039 Hypothyroidism, unspecified: Secondary | ICD-10-CM | POA: Diagnosis not present

## 2016-04-14 DIAGNOSIS — Z79899 Other long term (current) drug therapy: Secondary | ICD-10-CM | POA: Insufficient documentation

## 2016-04-14 DIAGNOSIS — I4892 Unspecified atrial flutter: Secondary | ICD-10-CM | POA: Diagnosis not present

## 2016-04-14 DIAGNOSIS — I4891 Unspecified atrial fibrillation: Secondary | ICD-10-CM | POA: Diagnosis present

## 2016-04-14 DIAGNOSIS — E1122 Type 2 diabetes mellitus with diabetic chronic kidney disease: Secondary | ICD-10-CM | POA: Insufficient documentation

## 2016-04-14 HISTORY — PX: CARDIOVERSION: SHX1299

## 2016-04-14 LAB — GLUCOSE, CAPILLARY: Glucose-Capillary: 238 mg/dL — ABNORMAL HIGH (ref 65–99)

## 2016-04-14 SURGERY — CARDIOVERSION
Anesthesia: General

## 2016-04-14 MED ORDER — LIDOCAINE HCL (CARDIAC) 20 MG/ML IV SOLN
INTRAVENOUS | Status: DC | PRN
  Administered 2016-04-14: 40 mg via INTRAVENOUS

## 2016-04-14 MED ORDER — PROPOFOL 10 MG/ML IV BOLUS
INTRAVENOUS | Status: DC | PRN
  Administered 2016-04-14: 70 mg via INTRAVENOUS

## 2016-04-14 MED ORDER — SODIUM CHLORIDE 0.9 % IV SOLN
INTRAVENOUS | Status: DC
  Administered 2016-04-14: 09:00:00 via INTRAVENOUS

## 2016-04-14 NOTE — Transfer of Care (Signed)
Immediate Anesthesia Transfer of Care Note  Patient: Kathryn Richardson  Procedure(s) Performed: Procedure(s): CARDIOVERSION (N/A)  Patient Location: Endoscopy Unit  Anesthesia Type:General  Level of Consciousness: awake, patient cooperative and responds to stimulation  Airway & Oxygen Therapy: Patient Spontanous Breathing and Patient connected to face mask  Post-op Assessment: Report given to RN and Post -op Vital signs reviewed and stable  Post vital signs: Reviewed and stable  Last Vitals:  Vitals:   04/14/16 0947 04/14/16 0948  BP:  (!) 151/70  Pulse: 80 80  Resp: 18 13  Temp:      Last Pain:  Vitals:   04/14/16 0845  TempSrc: Oral         Complications: No apparent anesthesia complications

## 2016-04-14 NOTE — H&P (View-Only) (Signed)
Patient ID: Kathryn Richardson, female   DOB: November 13, 1934, 81 y.o.   MRN: [id number]      Primary Care Physician: Gara Kroner, MD Referring Physician:  Dr Irish Lack EP: Dr. Haydee Salter is a 81 y.o. female with a h/o persistent atrial fibrillation and bradycardia  receiving a PPM 12/16 for syncope, thought maybe caused from 1:1 aflutter.  She was discharged in afib on flecainide 50 mg  and diltiazem 240 mg qd was added for rate control.  However she remained in afib. Flecainide was increased to 100 mg bid without converting pt to SR and level of flecainide was  high on this dose, so she was reduced to 75 mg bid. She presented for cardioversion in January 2016 but DCCV was cancelled due to hyperglycemia and she was hospitalized. DCCV was successfully performed later after blood sugar stabilized. She has maintained SR until just recently.  She asked to be seen in the afib clinic 6/21 due to being in afib since the previousThursday. She has been compliant with flecainide and coumadin. She has been out of town in [place] attending the funeral of her 56 year old mother in law and thinks maybe the stress of this caused her to slip into afib. She is mildly symptomatic with fatigue and dyspnea.   She was set up for DCCV 10/2414 which was successful. She returns today having maintained SR and feeling well.  Being seen in [place] clinic 04/09/16. Pt finished prednisone taper Friday for treatment of gout and noted presence of afib on Saturday and has had weakness/fatigue since then. She has not missed any doses of eliquis .  Today, she denies symptoms of  chest pain , orthopnea, PND, lower extremity edema, presyncope, syncope, or neurologic sequela.Positive for fatigue.The patient is tolerating medications without difficulties and is otherwise without complaint today.   Past Medical History:  Diagnosis Date  . Allergic rhinitis   . Atrial fibrillation ([place])   . Chronic renal disease, stage IV ([place])   .  Essential hypertension   . GERD (gastroesophageal reflux disease)   . Gout   . Hyperlipemia, mixed   . Insomnia   . Type 2 diabetes mellitus ([place])    Past Surgical History:  Procedure Laterality Date  . CARDIOVERSION N/A 05/29/2014   Procedure: CARDIOVERSION;  Surgeon: Fay Records, MD;  Location: [facility] ENDOSCOPY;  Service: Cardiovascular;  Laterality: N/A;  . CARDIOVERSION N/A 11/03/2014   Procedure: CARDIOVERSION;  Surgeon: Thayer Headings, MD;  Location: [place];  Service: Cardiovascular;  Laterality: N/A;  . CESAREAN SECTION     x2  . ESOPHAGEAL DILATION    . PERMANENT PACEMAKER INSERTION N/A 04/26/2014   Procedure: PERMANENT PACEMAKER INSERTION;  Surgeon: Evans Lance, MD;  Location: [facility] CATH LAB;  Service: Cardiovascular;  Laterality: N/A;  . TEE WITHOUT CARDIOVERSION N/A 05/29/2014   Procedure: TRANSESOPHAGEAL ECHOCARDIOGRAM (TEE);  Surgeon: Fay Records, MD;  Location: [facility] ENDOSCOPY;  Service: Cardiovascular;  Laterality: N/A;    Current Outpatient Prescriptions  Medication Sig Dispense Refill  . allopurinol (ZYLOPRIM) 100 MG tablet Take 100 mg by mouth at bedtime.    Marland Kitchen apixaban (ELIQUIS) 2.5 MG TABS tablet Take 1 tablet (2.5 mg total) by mouth 2 (two) times daily. 60 tablet 11  . atorvastatin (LIPITOR) 80 MG tablet Take 80 mg by mouth at bedtime.     . benazepril (LOTENSIN) 20 MG tablet Take 20 mg by mouth daily.    Marland Kitchen diltiazem (CARDIZEM CD) 240  MG 24 hr capsule Take 1 capsule (240 mg total) by mouth daily. 90 capsule 2  . flecainide (TAMBOCOR) 50 MG tablet TAKE 1 & 1/2 TABLETS BY MOUTH EVERY 12 HOURS 90 tablet 9  . furosemide (LASIX) 40 MG tablet Take 40 mg by mouth daily.    . Insulin Detemir (LEVEMIR [place]) Inject into the skin. 36 UNITS IN THE MORNING    . KLOR-CON M20 20 MEQ tablet Take 20 mEq by mouth daily.  3  . levothyroxine (SYNTHROID, LEVOTHROID) 50 MCG tablet Take 50 mcg by mouth daily. On a empty stomach.    . repaglinide (PRANDIN) 0.5 MG tablet Take 0.5 mg by  mouth 2 (two) times daily before a meal.     No current facility-administered medications for this encounter.     Allergies  Allergen Reactions  . Cortisone Other (See Comments)    Hyperglycemia   . Prednisone Other (See Comments)    Hyperglycemia     Social History   Social History  . Marital status: Married    Spouse name: N/A  . Number of children: N/A  . Years of education: N/A   Occupational History  . Not on file.   Social History Main Topics  . Smoking status: Never Smoker  . Smokeless tobacco: Never Used  . Alcohol use No  . Drug use: No  . Sexual activity: Not on file   Other Topics Concern  . Not on file   Social History Narrative   Lives in [place].  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 Coronary artery disease Father 28  . Heart attack Father   . Congestive Heart Failure Mother   . Alzheimer's disease Mother    Physical Exam: Vitals:   04/09/16 1415  BP: 124/82  BP Location: Left Arm  Patient Position: Sitting  Cuff Size: Normal  Pulse: 93  Weight: 174 lb 6.4 oz (79.1 kg)  Height: 5\' 3"  (1.6 m)    GEN- The patient is well appearing, alert and oriented x 3 today.   Head- normocephalic, atraumatic Eyes-  Sclera clear, conjunctiva pink Ears- hearing intact Oropharynx- clear Neck- supple, no JVP Lymph- no cervical lymphadenopathy Lungs- Clear to ausculation bilaterally, normal work of breathing Heart-irregular rate and rhythm, no murmurs, rubs or gallops, PMI not laterally displaced. Pacer site well healed. GI- soft, NT, ND, + BS Extremities- no clubbing, cyanosis, or edema   EKG -afib at 93 bpm, qrs int 102 ms, qtc 457 ms Epic records reviewed  Assessment and Plan:  1.Symptomatic Persistent afib   Had maintained SR until recent prednisone taper Continue flecainide, cardizem NO MISSED DOSES OF ELIQUIS Will schedule for DCCV Update echo  2. PPM  Pt is  scheduled to f/u with Dr. Lovena Le in December  3. HTN  Stable today.  F/u in afib clinic one week after cardioversion  Butch Penny C. Advait Buice, [facility] [address] [place],  [address] [phone]

## 2016-04-14 NOTE — Anesthesia Preprocedure Evaluation (Addendum)
Anesthesia Evaluation  Patient identified by MRN, date of birth, ID band Patient awake    Reviewed: Allergy & Precautions, NPO status , Patient's Chart, lab work & pertinent test results  Airway Mallampati: II   Neck ROM: Full    Dental  (+) Teeth Intact   Pulmonary neg pulmonary ROS,    breath sounds clear to auscultation       Cardiovascular hypertension, + dysrhythmias + pacemaker  Rhythm:Irregular Rate:Normal     Neuro/Psych    GI/Hepatic GERD  ,  Endo/Other  diabetesHypothyroidism   Renal/GU      Musculoskeletal   Abdominal   Peds  Hematology   Anesthesia Other Findings   Reproductive/Obstetrics                             Anesthesia Physical Anesthesia Plan  ASA: III  Anesthesia Plan: General   Post-op Pain Management:    Induction: Intravenous  Airway Management Planned: Simple Face Mask  Additional Equipment:   Intra-op Plan:   Post-operative Plan:   Informed Consent: I have reviewed the patients History and Physical, chart, labs and discussed the procedure including the risks, benefits and alternatives for the proposed anesthesia with the patient or authorized representative who has indicated his/her understanding and acceptance.     Plan Discussed with:   Anesthesia Plan Comments:        Anesthesia Quick Evaluation                                   Anesthesia Evaluation  Patient identified by MRN, date of birth, ID band Patient awake    Reviewed: Allergy & Precautions, NPO status   History of Anesthesia Complications Negative for: history of anesthetic complications  Airway Mallampati: II  TM Distance: >3 FB Neck ROM: Full    Dental  (+) Caps, Dental Advisory Given   Pulmonary neg pulmonary ROS,    Pulmonary exam normal       Cardiovascular hypertension, Pt. on medications + pacemaker Rhythm:Irregular Rate:Abnormal  Mild to  mod LV depression   Neuro/Psych negative neurological ROS  negative psych ROS   GI/Hepatic Neg liver ROS, GERD-  ,  Endo/Other  diabetesHypothyroidism   Renal/GU Renal InsufficiencyRenal disease     Musculoskeletal   Abdominal   Peds  Hematology negative hematology ROS (+)   Anesthesia Other Findings   Reproductive/Obstetrics                         Anesthesia Physical Anesthesia Plan  ASA: III  Anesthesia Plan: General   Post-op Pain Management:    Induction: Intravenous  Airway Management Planned: Mask  Additional Equipment:   Intra-op Plan:   Post-operative Plan:   Informed Consent: I have reviewed the patients History and Physical, chart, labs and discussed the procedure including the risks, benefits and alternatives for the proposed anesthesia with the patient or authorized representative who has indicated his/her understanding and acceptance.   Dental advisory given  Plan Discussed with: CRNA, Anesthesiologist and Surgeon  Anesthesia Plan Comments:        Anesthesia Quick Evaluation

## 2016-04-14 NOTE — Discharge Instructions (Signed)
Electrical Cardioversion, Care After °This sheet gives you information about how to care for yourself after your procedure. Your health care provider may also give you more specific instructions. If you have problems or questions, contact your health care provider. °What can I expect after the procedure? °After the procedure, it is common to have: °· Some redness on the skin where the shocks were given. °Follow these instructions at home: °· Do not drive for 24 hours if you were given a medicine to help you relax (sedative). °· Take over-the-counter and prescription medicines only as told by your health care provider. °· Ask your health care provider how to check your pulse. Check it often. °· Rest for 48 hours after the procedure or as told by your health care provider. °· Avoid or limit your caffeine use as told by your health care provider. °Contact a health care provider if: °· You feel like your heart is beating too quickly or your pulse is not regular. °· You have a serious muscle cramp that does not go away. °Get help right away if: °· You have discomfort in your chest. °· You are dizzy or you feel faint. °· You have trouble breathing or you are short of breath. °· Your speech is slurred. °· You have trouble moving an arm or leg on one side of your body. °· Your fingers or toes turn cold or blue. °This information is not intended to replace advice given to you by your health care provider. Make sure you discuss any questions you have with your health care provider. °Document Released: 02/16/2013 Document Revised: 11/30/2015 Document Reviewed: 11/02/2015 °Elsevier Interactive Patient Education © 2017 Elsevier Inc. ° °

## 2016-04-14 NOTE — Interval H&P Note (Signed)
History and Physical Interval Note:  04/14/2016 9:32 AM  Kathryn Richardson  has presented today for surgery, with the diagnosis of AFIB  The various methods of treatment have been discussed with the patient and family. After consideration of risks, benefits and other options for treatment, the patient has consented to  Procedure(s): CARDIOVERSION (N/A) as a surgical intervention .  The patient's history has been reviewed, patient examined, no change in status, stable for surgery.  I have reviewed the patient's chart and labs.  Questions were answered to the patient's satisfaction.     [employer]

## 2016-04-14 NOTE — CV Procedure (Signed)
    Electrical Cardioversion Procedure Note JUDI JAFFE [id number] [birth year]  Procedure: Electrical Cardioversion Indications:  Atrial Fibrillation  Time Out: Verified patient identification, verified procedure,medications/allergies/relevent history reviewed, required imaging and test results available.  Performed  Procedure Details  The patient was NPO after midnight. Anesthesia was administered at the beside  by [facility] with propofol.  Cardioversion was performed with synchronized biphasic defibrillation via AP pads with 120 joules.  1 attempt(s) were performed.  The patient converted to normal sinus rhythm. The patient tolerated the procedure well   IMPRESSION:  Successful cardioversion of atrial fibrillation A-V pacing.  Will be interrogated (pacer) Continue Flec.  Candee Furbish 04/14/2016, 9:52 AM

## 2016-04-14 NOTE — Anesthesia Postprocedure Evaluation (Signed)
Anesthesia Post Note  Patient: Kathryn Richardson  Procedure(s) Performed: Procedure(s) (LRB): CARDIOVERSION (N/A)  Patient location during evaluation: Endoscopy Anesthesia Type: General Level of consciousness: awake and alert Pain management: pain level controlled Vital Signs Assessment: post-procedure vital signs reviewed and stable Respiratory status: spontaneous breathing, nonlabored ventilation, respiratory function stable and patient connected to nasal cannula oxygen Cardiovascular status: blood pressure returned to baseline and stable Postop Assessment: no signs of nausea or vomiting Anesthetic complications: no    Last Vitals:  Vitals:   04/14/16 1000 04/14/16 1005  BP: (!) 169/67 (!) 184/76  Pulse: 65 68  Resp: 16 17  Temp:      Last Pain:  Vitals:   04/14/16 0845  TempSrc: Oral                 Bristol Soy,JAMES TERRILL

## 2016-04-15 ENCOUNTER — Encounter (HOSPITAL_COMMUNITY): Payer: Self-pay | Admitting: Cardiology

## 2016-04-22 ENCOUNTER — Ambulatory Visit (HOSPITAL_COMMUNITY)
Admission: RE | Admit: 2016-04-22 | Discharge: 2016-04-22 | Disposition: A | Discharge status: Home / Self Care | Payer: Medicare Other | Source: Ambulatory Visit | Attending: Nurse Practitioner | Admitting: Nurse Practitioner

## 2016-04-22 ENCOUNTER — Encounter (HOSPITAL_COMMUNITY): Payer: Self-pay | Admitting: Nurse Practitioner

## 2016-04-22 ENCOUNTER — Ambulatory Visit (HOSPITAL_BASED_OUTPATIENT_CLINIC_OR_DEPARTMENT_OTHER)
Admission: RE | Admit: 2016-04-22 | Discharge: 2016-04-22 | Disposition: A | Discharge status: Home / Self Care | Payer: Medicare Other | Source: Ambulatory Visit | Attending: Nurse Practitioner | Admitting: Nurse Practitioner

## 2016-04-22 ENCOUNTER — Other Ambulatory Visit: Payer: Self-pay

## 2016-04-22 VITALS — BP 168/90 | HR 69 | Ht 63.0 in | Wt 177.0 lb

## 2016-04-22 DIAGNOSIS — Z79899 Other long term (current) drug therapy: Secondary | ICD-10-CM | POA: Insufficient documentation

## 2016-04-22 DIAGNOSIS — N184 Chronic kidney disease, stage 4 (severe): Secondary | ICD-10-CM | POA: Insufficient documentation

## 2016-04-22 DIAGNOSIS — Z888 Allergy status to other drugs, medicaments and biological substances status: Secondary | ICD-10-CM | POA: Diagnosis not present

## 2016-04-22 DIAGNOSIS — Z7901 Long term (current) use of anticoagulants: Secondary | ICD-10-CM | POA: Insufficient documentation

## 2016-04-22 DIAGNOSIS — I480 Paroxysmal atrial fibrillation: Secondary | ICD-10-CM

## 2016-04-22 DIAGNOSIS — Z8249 Family history of ischemic heart disease and other diseases of the circulatory system: Secondary | ICD-10-CM | POA: Insufficient documentation

## 2016-04-22 DIAGNOSIS — E782 Mixed hyperlipidemia: Secondary | ICD-10-CM | POA: Insufficient documentation

## 2016-04-22 DIAGNOSIS — K219 Gastro-esophageal reflux disease without esophagitis: Secondary | ICD-10-CM | POA: Diagnosis not present

## 2016-04-22 DIAGNOSIS — I129 Hypertensive chronic kidney disease with stage 1 through stage 4 chronic kidney disease, or unspecified chronic kidney disease: Secondary | ICD-10-CM | POA: Insufficient documentation

## 2016-04-22 DIAGNOSIS — Z794 Long term (current) use of insulin: Secondary | ICD-10-CM | POA: Insufficient documentation

## 2016-04-22 DIAGNOSIS — I10 Essential (primary) hypertension: Secondary | ICD-10-CM | POA: Insufficient documentation

## 2016-04-22 DIAGNOSIS — I481 Persistent atrial fibrillation: Secondary | ICD-10-CM | POA: Diagnosis not present

## 2016-04-22 DIAGNOSIS — G4700 Insomnia, unspecified: Secondary | ICD-10-CM | POA: Insufficient documentation

## 2016-04-22 DIAGNOSIS — E1122 Type 2 diabetes mellitus with diabetic chronic kidney disease: Secondary | ICD-10-CM | POA: Diagnosis not present

## 2016-04-22 DIAGNOSIS — M109 Gout, unspecified: Secondary | ICD-10-CM | POA: Diagnosis not present

## 2016-04-22 NOTE — Progress Notes (Signed)
  Echocardiogram 2D Echocardiogram has been performed.  Donata Clay 04/22/2016, 3:34 PM

## 2016-04-22 NOTE — Progress Notes (Signed)
Patient ID: SHELONDA SAXE, female   DOB: [birth year], 81 y.o.   MRN: [id number]      Primary Care Physician: Gara Kroner, MD Referring Physician:  Dr Irish Lack EP: Dr. Haydee Salter is a 81 y.o. female with a h/o persistent atrial fibrillation and bradycardia  receiving a PPM 12/16 for syncope, thought maybe caused from 1:1 aflutter.  She was discharged in afib on flecainide 50 mg  and diltiazem 240 mg qd was added for rate control.  However she remained in afib. Flecainide was increased to 100 mg bid without converting pt to SR and level of flecainide was  high on this dose, so she was reduced to 75 mg bid. She presented for cardioversion in January 2016 but DCCV was cancelled due to hyperglycemia and she was hospitalized. DCCV was successfully performed later after blood sugar stabilized. She has maintained SR until just recently.  She asked to be seen in the afib clinic 6/21 due to being in afib since the previousThursday. She has been compliant with flecainide and coumadin. She has been out of town in [place] attending the funeral of her 71 year old mother in law and thinks maybe the stress of this caused her to slip into afib. She is mildly symptomatic with fatigue and dyspnea.   She was set up for DCCV 10/2414 which was successful. She returns today having maintained SR and feeling well.  Being seen in [place] clinic 04/09/16. Pt finished prednisone taper Friday for treatment of gout and noted presence of afib on Saturday and has had weakness/fatigue since then. She has not missed any doses of eliquis. Scheduled for cardioversion.  Returns to afib clinic one week s/p cardioversion which was successful. Updated echo prior to this appointment but has not been read yet. EKG shows paced rhythm and she has not had  any further afib. Taking apixaban without missed doses.  Today, she denies symptoms of  chest pain , orthopnea, PND, lower extremity edema, presyncope, syncope, or neurologic  sequela.Positive for fatigue.The patient is tolerating medications without difficulties and is otherwise without complaint today.   Past Medical History:  Diagnosis Date  . Allergic rhinitis   . Atrial fibrillation ([place])   . Chronic renal disease, stage IV ([place])   . Essential hypertension   . GERD (gastroesophageal reflux disease)   . Gout   . Hyperlipemia, mixed   . Insomnia   . Type 2 diabetes mellitus ([place])    Past Surgical History:  Procedure Laterality Date  . CARDIOVERSION N/A 05/29/2014   Procedure: CARDIOVERSION;  Surgeon: Fay Records, MD;  Location: [facility] ENDOSCOPY;  Service: Cardiovascular;  Laterality: N/A;  . CARDIOVERSION N/A 11/03/2014   Procedure: CARDIOVERSION;  Surgeon: Thayer Headings, MD;  Location: [place];  Service: Cardiovascular;  Laterality: N/A;  . CARDIOVERSION N/A 04/14/2016   Procedure: CARDIOVERSION;  Surgeon: Jerline Pain, MD;  Location: [place];  Service: Cardiovascular;  Laterality: N/A;  . CESAREAN SECTION     x2  . ESOPHAGEAL DILATION    . PERMANENT PACEMAKER INSERTION N/A 04/26/2014   Procedure: PERMANENT PACEMAKER INSERTION;  Surgeon: Evans Lance, MD;  Location: [facility] CATH LAB;  Service: Cardiovascular;  Laterality: N/A;  . TEE WITHOUT CARDIOVERSION N/A 05/29/2014   Procedure: TRANSESOPHAGEAL ECHOCARDIOGRAM (TEE);  Surgeon: Fay Records, MD;  Location[facility] ENDOSCOPY;  Service: Cardiovascular;  Laterality: N/A;    Current Outpatient Prescriptions  Medication Sig Dispense Refill  . allopurinol (ZYLOPRIM) 100 MG tablet Take  100 mg by mouth at bedtime.    Marland Kitchen apixaban (ELIQUIS) 2.5 MG TABS tablet Take 1 tablet (2.5 mg total) by mouth 2 (two) times daily. 60 tablet 11  . atorvastatin (LIPITOR) 80 MG tablet Take 80 mg by mouth at bedtime.     . benazepril (LOTENSIN) 20 MG tablet Take 20 mg by mouth daily.    Marland Kitchen diltiazem (CARDIZEM CD) 240 MG 24 hr capsule TAKE 1 CAPSULE BY MOUTH DAILY 90 capsule 0  . flecainide (TAMBOCOR) 50 MG tablet TAKE 1 AND  1/2 TABLET BY MOUTH EVERY 12 HOURS 270 tablet 0  . furosemide (LASIX) 40 MG tablet Take 40 mg by mouth daily.    . Insulin Detemir (LEVEMIR [place]) Inject into the skin. 36 UNITS IN THE MORNING    . KLOR-CON M20 20 MEQ tablet Take 20 mEq by mouth daily.  3  . levothyroxine (SYNTHROID, LEVOTHROID) 50 MCG tablet Take 50 mcg by mouth daily. On a empty stomach.    . repaglinide (PRANDIN) 0.5 MG tablet Take 0.5 mg by mouth 2 (two) times daily before a meal.     No current facility-administered medications for this encounter.     Allergies  Allergen Reactions  . Cortisone Other (See Comments)    Hyperglycemia   . Prednisone Other (See Comments)    Hyperglycemia     Social History   Social History  . Marital status: Married    Spouse name: N/A  . Number of children: N/A  . Years of education: N/A   Occupational History  . Not on file.   Social History Main Topics  . Smoking status: Never Smoker  . Smokeless tobacco: Never Used  . Alcohol use No  . Drug use: No  . Sexual activity: Not on file   Other Topics Concern  . Not on file   Social History Narrative   Lives in [place].  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 Coronary artery disease Father 69  . Heart attack Father   . Congestive Heart Failure Mother   . Alzheimer's disease Mother    Physical Exam: Vitals:   04/22/16 1609  Weight: 177 lb (80.3 kg)  Height: 5\' 3"  (1.6 m)    GEN- The patient is well appearing, alert and oriented x 3 today.   Head- normocephalic, atraumatic Eyes-  Sclera clear, conjunctiva pink Ears- hearing intact Oropharynx- clear Neck- supple, no JVP Lymph- no cervical lymphadenopathy Lungs- Clear to ausculation bilaterally, normal work of breathing Heart-irregular rate and rhythm, no murmurs, rubs or gallops, PMI not laterally displaced. Pacer site well healed. GI- soft, NT, ND, + BS Extremities- no clubbing, cyanosis,  or edema   EKG -atrail paced at 69 bpm, prolonged AV conduction, pr int 278 ms, qrs int 92 ms, qtc 437 ms Epic records reviewed  Assessment and Plan:  1.Symptomatic Persistent afib   Had maintained SR until recent prednisone taper Successful cardioversion 12/4 Continue flecainide, cardizem  Reminded not to miss doses of apixaban Updated echo done today and will call with results   2. PPM  Pt is scheduled to f/u with Dr. Lovena Le in December  3. HTN  Elevated today but pt states better at home  F/u in afib clinic  As needed  [place]. [place], [facility] [address] [place], [place] [address] [phone]

## 2016-04-24 ENCOUNTER — Encounter: Payer: Self-pay | Admitting: Internal Medicine

## 2016-04-29 DIAGNOSIS — M25572 Pain in left ankle and joints of left foot: Secondary | ICD-10-CM | POA: Diagnosis not present

## 2016-04-29 DIAGNOSIS — M109 Gout, unspecified: Secondary | ICD-10-CM | POA: Diagnosis not present

## 2016-04-30 ENCOUNTER — Ambulatory Visit
Admission: RE | Admit: 2016-04-30 | Discharge: 2016-04-30 | Disposition: A | Discharge status: Home / Self Care | Payer: Medicare Other | Source: Ambulatory Visit | Attending: Family Medicine | Admitting: Family Medicine

## 2016-04-30 ENCOUNTER — Other Ambulatory Visit: Payer: Self-pay | Admitting: Family Medicine

## 2016-04-30 DIAGNOSIS — M25572 Pain in left ankle and joints of left foot: Secondary | ICD-10-CM

## 2016-04-30 DIAGNOSIS — M7989 Other specified soft tissue disorders: Secondary | ICD-10-CM | POA: Diagnosis not present

## 2016-05-07 ENCOUNTER — Other Ambulatory Visit: Payer: Self-pay | Admitting: Internal Medicine

## 2016-05-07 DIAGNOSIS — I4891 Unspecified atrial fibrillation: Secondary | ICD-10-CM

## 2016-05-08 ENCOUNTER — Encounter: Payer: Self-pay | Admitting: Internal Medicine

## 2016-05-08 ENCOUNTER — Encounter (INDEPENDENT_AMBULATORY_CARE_PROVIDER_SITE_OTHER): Payer: Self-pay

## 2016-05-08 ENCOUNTER — Ambulatory Visit (INDEPENDENT_AMBULATORY_CARE_PROVIDER_SITE_OTHER): Payer: Medicare Other | Admitting: Internal Medicine

## 2016-05-08 VITALS — BP 148/60 | HR 79 | Ht 63.0 in | Wt 177.0 lb

## 2016-05-08 DIAGNOSIS — Z950 Presence of cardiac pacemaker: Secondary | ICD-10-CM | POA: Diagnosis not present

## 2016-05-08 DIAGNOSIS — R001 Bradycardia, unspecified: Secondary | ICD-10-CM

## 2016-05-08 NOTE — Patient Instructions (Signed)
Medication Instructions:  Your physician recommends that you continue on your current medications as directed. Please refer to the Current Medication list given to you today.   Labwork: None Ordered   Testing/Procedures: None Ordered    Follow-Up: Your physician wants you to follow-up in: 1 year with Dr. Lovena Le. You will receive a reminder letter in the mail two months in advance. If you don't receive a letter, please call our office to schedule the follow-up appointment.   Remote monitoring is used to monitor your Pacemaker or ICD from home. This monitoring reduces the number of office visits required to check your device to one time per year. It allows [place] to keep an eye on the functioning of your device to ensure it is working properly. You are scheduled for a device check from home on 08/07/16. You may send your transmission at any time that day. If you have a wireless device, the transmission will be sent automatically. After your physician reviews your transmission, you will receive a postcard with your next transmission date.    Any Other Special Instructions Will Be Listed Below (If Applicable).     If you need a refill on your cardiac medications before your next appointment, please call your pharmacy.

## 2016-05-08 NOTE — Progress Notes (Signed)
HPI Kathryn Richardson returns today, 2 years after PPM insertion for symptomatic bradycardia due to sinus node dysfunction. She has PAF In the interim, she had a DCCV and continues to do well with no chest pain or sob. She is considering knee replacement surgery.  Allergies  Allergen Reactions  . Cortisone Other (See Comments)    Hyperglycemia   . Prednisone Other (See Comments)    Hyperglycemia      Current Outpatient Prescriptions  Medication Sig Dispense Refill  . allopurinol (ZYLOPRIM) 100 MG tablet Take 200 mg by mouth at bedtime.     Marland Kitchen atorvastatin (LIPITOR) 80 MG tablet Take 80 mg by mouth at bedtime.     . benazepril (LOTENSIN) 20 MG tablet Take 20 mg by mouth daily.    Marland Kitchen diltiazem (CARDIZEM CD) 240 MG 24 hr capsule TAKE 1 CAPSULE BY MOUTH DAILY 90 capsule 0  . ELIQUIS 2.5 MG TABS tablet TAKE 1 TABLET BY MOUTH TWICE DAILY 60 tablet 11  . flecainide (TAMBOCOR) 50 MG tablet TAKE 1 AND 1/2 TABLET BY MOUTH EVERY 12 HOURS 270 tablet 0  . furosemide (LASIX) 40 MG tablet Take 40 mg by mouth daily.    . Insulin Detemir (LEVEMIR [place]) Inject into the skin. 36 UNITS IN THE MORNING    . KLOR-CON M20 20 MEQ tablet Take 20 mEq by mouth daily.  3  . levothyroxine (SYNTHROID, LEVOTHROID) 50 MCG tablet Take 50 mcg by mouth daily. On a empty stomach.    . repaglinide (PRANDIN) 1 MG tablet Take 1 mg by mouth 2 (two) times daily before a meal.      No current facility-administered medications for this visit.      Past Medical History:  Diagnosis Date  . Allergic rhinitis   . Atrial fibrillation ([place])   . Chronic renal disease, stage IV ([place])   . Essential hypertension   . GERD (gastroesophageal reflux disease)   . Gout   . Hyperlipemia, mixed   . Insomnia   . Type 2 diabetes mellitus (HCC)     ROS:   All systems reviewed and negative except as noted in the HPI.   Past Surgical History:  Procedure Laterality Date  . CARDIOVERSION N/A 05/29/2014   Procedure: CARDIOVERSION;   Surgeon: Fay Records, MD;  Location: [facility] ENDOSCOPY;  Service: Cardiovascular;  Laterality: N/A;  . CARDIOVERSION N/A 11/03/2014   Procedure: CARDIOVERSION;  Surgeon: Thayer Headings, MD;  Location: [place];  Service: Cardiovascular;  Laterality: N/A;  . CARDIOVERSION N/A 04/14/2016   Procedure: CARDIOVERSION;  Surgeon: Jerline Pain, MD;  Location: [place];  Service: Cardiovascular;  Laterality: N/A;  . CESAREAN SECTION     x2  . ESOPHAGEAL DILATION    . PERMANENT PACEMAKER INSERTION N/A 04/26/2014   Procedure: PERMANENT PACEMAKER INSERTION;  Surgeon: Evans Lance, MD;  Location: [facility] CATH LAB;  Service: Cardiovascular;  Laterality: N/A;  . TEE WITHOUT CARDIOVERSION N/A 05/29/2014   Procedure: TRANSESOPHAGEAL ECHOCARDIOGRAM (TEE);  Surgeon: Fay Records, MD;  Location: [facility] ENDOSCOPY;  Service: Cardiovascular;  Laterality: N/A;     Family History  Problem Relation Age of Onset  . Coronary artery disease Father 61  . Heart attack Father   . Congestive Heart Failure Mother   . Alzheimer's disease Mother      Social History   Social History  . Marital status: Married    Spouse name: N/A  . Number of children: N/A  . Years of  education: N/A   Occupational History  . Not on file.   Social History Main Topics  . Smoking status: Never Smoker  . Smokeless tobacco: Never Used  . Alcohol use No  . Drug use: No  . Sexual activity: Not on file   Other Topics Concern  . Not on file   Social History Narrative   Lives in [place].  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     BP (!) 148/60   Pulse 79   Ht 5\' 3"  (1.6 m)   Wt 177 lb (80.3 kg)   SpO2 95%   BMI 31.35 kg/m   Physical Exam:  Well appearing 81yo woman, NAD HEENT: Unremarkable Neck:  6 cm JVD, no thyromegally Lymphatics:  No adenopathy Back:  No CVA tenderness Lungs:  Clear with no wheezes, rales, or rhonchi HEART:  Regular rate rhythm, no murmurs, no rubs, no clicks Abd:  soft, positive  bowel sounds, no organomegally, no rebound, no guarding Ext:  2 plus pulses, no edema, no cyanosis, no clubbing Skin:  No rashes no nodules Neuro:  CN II through XII intact, motor grossly intact   DEVICE  Normal device function.  See PaceArt for details.   Assess/Plan: 1. Sinus node dysfunction - she is asymptomatic s/p PPM insertion 2. PAF - she was cardioverted in November. She has maintained NSR. She will continue flecainide 3. PPM - her Medtronic device is working normally. Will recheck in several months. 4. HTN - we discussed weight loss. She will maintain a low sodium diet.   Mikle Bosworth.D.

## 2016-05-15 ENCOUNTER — Encounter (HOSPITAL_COMMUNITY): Payer: Self-pay | Admitting: Emergency Medicine

## 2016-05-15 ENCOUNTER — Emergency Department (HOSPITAL_COMMUNITY): Payer: Medicare Other

## 2016-05-15 DIAGNOSIS — I10 Essential (primary) hypertension: Secondary | ICD-10-CM | POA: Diagnosis not present

## 2016-05-15 DIAGNOSIS — I129 Hypertensive chronic kidney disease with stage 1 through stage 4 chronic kidney disease, or unspecified chronic kidney disease: Secondary | ICD-10-CM | POA: Diagnosis not present

## 2016-05-15 DIAGNOSIS — Z950 Presence of cardiac pacemaker: Secondary | ICD-10-CM | POA: Diagnosis not present

## 2016-05-15 DIAGNOSIS — Z794 Long term (current) use of insulin: Secondary | ICD-10-CM | POA: Diagnosis not present

## 2016-05-15 DIAGNOSIS — R05 Cough: Secondary | ICD-10-CM | POA: Diagnosis present

## 2016-05-15 DIAGNOSIS — Z79899 Other long term (current) drug therapy: Secondary | ICD-10-CM | POA: Insufficient documentation

## 2016-05-15 DIAGNOSIS — R030 Elevated blood-pressure reading, without diagnosis of hypertension: Secondary | ICD-10-CM | POA: Insufficient documentation

## 2016-05-15 DIAGNOSIS — N183 Chronic kidney disease, stage 3 (moderate): Secondary | ICD-10-CM | POA: Insufficient documentation

## 2016-05-15 DIAGNOSIS — E039 Hypothyroidism, unspecified: Secondary | ICD-10-CM | POA: Diagnosis not present

## 2016-05-15 DIAGNOSIS — E119 Type 2 diabetes mellitus without complications: Secondary | ICD-10-CM | POA: Insufficient documentation

## 2016-05-15 DIAGNOSIS — Z7901 Long term (current) use of anticoagulants: Secondary | ICD-10-CM | POA: Insufficient documentation

## 2016-05-15 DIAGNOSIS — J111 Influenza due to unidentified influenza virus with other respiratory manifestations: Secondary | ICD-10-CM | POA: Insufficient documentation

## 2016-05-15 NOTE — ED Triage Notes (Signed)
Pt c/o SOB related to cough; has had cold for past week; pt has had "hacking cough" pt had 3 episodes where she had coughing that made it difficulty for her to catch her breath; denies COPD and emphysema; SpO2 91-93% in triage; denies fever

## 2016-05-16 ENCOUNTER — Emergency Department (HOSPITAL_COMMUNITY): Payer: Medicare Other

## 2016-05-16 ENCOUNTER — Emergency Department (HOSPITAL_COMMUNITY)
Admission: EM | Admit: 2016-05-16 | Discharge: 2016-05-16 | Disposition: A | Discharge status: Home / Self Care | Payer: Medicare Other | Attending: Emergency Medicine | Admitting: Emergency Medicine

## 2016-05-16 DIAGNOSIS — R69 Illness, unspecified: Secondary | ICD-10-CM

## 2016-05-16 DIAGNOSIS — J111 Influenza due to unidentified influenza virus with other respiratory manifestations: Secondary | ICD-10-CM

## 2016-05-16 DIAGNOSIS — R05 Cough: Secondary | ICD-10-CM | POA: Diagnosis not present

## 2016-05-16 MED ORDER — ALBUTEROL SULFATE (2.5 MG/3ML) 0.083% IN NEBU
2.5000 mg | INHALATION_SOLUTION | Freq: Once | RESPIRATORY_TRACT | Status: AC
  Administered 2016-05-16: 2.5 mg via RESPIRATORY_TRACT
  Filled 2016-05-16: qty 3

## 2016-05-16 MED ORDER — AEROCHAMBER PLUS W/MASK MISC
1.0000 | Freq: Once | Status: AC
  Administered 2016-05-16: 1
  Filled 2016-05-16: qty 1

## 2016-05-16 MED ORDER — ALBUTEROL SULFATE HFA 108 (90 BASE) MCG/ACT IN AERS: 2.0000 | INHALATION_SPRAY | RESPIRATORY_TRACT | Status: DC | PRN

## 2016-05-16 MED ORDER — ALBUTEROL SULFATE HFA 108 (90 BASE) MCG/ACT IN AERS
2.0000 | INHALATION_SPRAY | Freq: Once | RESPIRATORY_TRACT | Status: AC
  Administered 2016-05-16: 2 via RESPIRATORY_TRACT
  Filled 2016-05-16: qty 6.7

## 2016-05-16 NOTE — ED Provider Notes (Signed)
[facility] DEPT Provider Note   CSN: [id number] Arrival date & time: 05/15/16  2300     History   Chief Complaint Chief Complaint  Patient presents with  . Cough    HPI Kathryn Richardson is a 82 y.o. female.  HPI complains of cough productive of slight amount of white phlegm onset one week ago. She presents tonight as she had 3 episodes of coughing spells that were spasmodic  this past week. She denies any shortness of breath. Denies fever. Admits to slight sore throat and mild bilateral ear pain. No treatment prior to coming here no nausea or vomiting... No other associated symptoms. Presently Reports mild dyspnea  Past Medical History:  Diagnosis Date  . Allergic rhinitis   . Atrial fibrillation ([place])   . Chronic renal disease, stage IV ([place])   . Essential hypertension   . GERD (gastroesophageal reflux disease)   . Gout   . Hyperlipemia, mixed   . Insomnia   . Type 2 diabetes mellitus [facility])     Patient Active Problem List   Diagnosis Date Noted  . Pacemaker 08/01/2014  . Uncontrolled diabetes mellitus ([place]) 05/26/2014  . Acute renal failure superimposed on stage 3 chronic kidney disease ([place]) 05/26/2014  . Uncontrolled hypertension 05/26/2014  . Hyperkalemia 05/26/2014  . GERD (gastroesophageal reflux disease) 05/26/2014  . Hypothyroidism 05/26/2014  . Acute gout- on steroid dose pack 04/26/2014  . Atrial fibrillation with rapid ventricular response ([place]) 04/26/2014  . Syncope 04/25/2014  . Atrial flutter with rapid ventricular response ([place]) 04/25/2014  . Chronic kidney disease, stage III (moderate) 10/05/2013  . Encounter for therapeutic drug monitoring 06/06/2013  . Essential hypertension, benign 03/25/2013  . Long term current use of anticoagulant therapy 03/25/2013  . Mixed hyperlipidemia 03/25/2013  . Paroxysmal atrial fibrillation 09/03/2011  . Bradycardia 09/03/2011    Past Surgical History:  Procedure Laterality Date  . CARDIOVERSION N/A 05/29/2014     Procedure: CARDIOVERSION;  Surgeon: Fay Records, MD;  Location: [facility] ENDOSCOPY;  Service: Cardiovascular;  Laterality: N/A;  . CARDIOVERSION N/A 11/03/2014   Procedure: CARDIOVERSION;  Surgeon: Thayer Headings, MD;  Location: [place];  Service: Cardiovascular;  Laterality: N/A;  . CARDIOVERSION N/A 04/14/2016   Procedure: CARDIOVERSION;  Surgeon: Jerline Pain, MD;  Location: [place];  Service: Cardiovascular;  Laterality: N/A;  . CESAREAN SECTION     x2  . ESOPHAGEAL DILATION    . PERMANENT PACEMAKER INSERTION N/A 04/26/2014   Procedure: PERMANENT PACEMAKER INSERTION;  Surgeon: Evans Lance, MD;  Location: [facility] CATH LAB;  Service: Cardiovascular;  Laterality: N/A;  . TEE WITHOUT CARDIOVERSION N/A 05/29/2014   Procedure: TRANSESOPHAGEAL ECHOCARDIOGRAM (TEE);  Surgeon: Fay Records, MD;  Location: [facility] ENDOSCOPY;  Service: Cardiovascular;  Laterality: N/A;    OB History    No data available       Home Medications    Prior to Admission medications   Medication Sig Start Date End Date Taking? Authorizing Provider  allopurinol (ZYLOPRIM) 100 MG tablet Take 200 mg by mouth at bedtime.     Historical Provider, MD  atorvastatin (LIPITOR) 80 MG tablet Take 80 mg by mouth at bedtime.     Historical Provider, MD  benazepril (LOTENSIN) 20 MG tablet Take 20 mg by mouth daily.    Historical Provider, MD  diltiazem (CARDIZEM CD) 240 MG 24 hr capsule TAKE 1 CAPSULE BY MOUTH DAILY 04/14/16   Evans Lance, MD  ELIQUIS 2.5 MG TABS tablet TAKE 1 TABLET  BY MOUTH TWICE DAILY 05/07/16   Thompson Grayer, MD  flecainide (TAMBOCOR) 50 MG tablet TAKE 1 AND 1/2 TABLET BY MOUTH EVERY 12 HOURS 04/14/16   Evans Lance, MD  furosemide (LASIX) 40 MG tablet Take 40 mg by mouth daily.    Historical Provider, MD  Insulin Detemir (LEVEMIR ) Inject into the skin. [place]    Historical Provider, MD  KLOR-CON M20 20 MEQ tablet Take 20 mEq by mouth daily. 04/09/14   Historical Provider, MD   levothyroxine (SYNTHROID, LEVOTHROID) 50 MCG tablet Take 50 mcg by mouth daily. On a empty stomach.    Historical Provider, MD  repaglinide (PRANDIN) 1 MG tablet Take 1 mg by mouth 2 (two) times daily before a meal.     Historical Provider, MD    Family History Family History  Problem Relation Age of Onset  . Coronary artery disease Father 67  . Heart attack Father   . Congestive Heart Failure Mother   . Alzheimer's disease Mother     Social History Social History  Substance Use Topics  . Smoking status: Never Smoker  . Smokeless tobacco: Never Used  . Alcohol use No     Allergies   Cortisone and Prednisone   Review of Systems Review of Systems  Constitutional: Negative.   HENT: Positive for ear pain and sore throat.   Respiratory: Positive for cough.   Cardiovascular: Negative.   Gastrointestinal: Negative.   Musculoskeletal: Negative.   Skin: Negative.   Allergic/Immunologic: Positive for immunocompromised state.       Diabetic  Neurological: Negative.   Psychiatric/Behavioral: Negative.      Physical Exam Updated Vital Signs BP 162/65 (BP Location: Left Arm)   Pulse 66   Temp 98.3 F (36.8 C) (Oral)   Resp 15   SpO2 95%   Physical Exam  Constitutional: She appears well-developed and well-nourished.  HENT:  Head: Normocephalic and atraumatic.  Right Ear: External ear normal.  Left Ear: External ear normal.  Mouth/Throat: Oropharynx is clear and moist.  Eyes: Conjunctivae are normal. Pupils are equal, round, and reactive to light.  Neck: Neck supple. No tracheal deviation present. No thyromegaly present.  Cardiovascular: Normal rate and regular rhythm.   No murmur heard. Pulmonary/Chest: Effort normal and breath sounds normal.  Abdominal: Soft. Bowel sounds are normal. She exhibits no distension. There is no tenderness.  Musculoskeletal: Normal range of motion. She exhibits no edema or tenderness.  Neurological: She is alert. Coordination normal.   Skin: Skin is warm and dry. No rash noted.  Psychiatric: She has a normal mood and affect.  Nursing note and vitals reviewed.    ED Treatments / Results  Labs (all labs ordered are listed, but only abnormal results are displayed) Labs Reviewed - No data to display  EKG  EKG Interpretation None     Chest x-ray viewed by me. Results for orders placed or performed during the hospital encounter of 04/14/16  Glucose, capillary  Result Value Ref Range   Glucose-Capillary 238 (H) 65 - 99 mg/dL   Dg Chest 2 View  Result Date: 05/16/2016 CLINICAL DATA:  Cough EXAM: CHEST  2 VIEW COMPARISON:  05/16/2014 FINDINGS: Left-sided dual lead pacing device with leads over the right atrium and right ventricle. No acute consolidation or effusion. Stable mild cardiomegaly. No pneumothorax. IMPRESSION: No acute infiltrate or edema Electronically Signed   By: Donavan Foil M.D.   On: 05/16/2016 01:11   Dg Ankle Complete Left  Result Date: 04/30/2016 CLINICAL DATA:  Pain and swelling 1 month, hx gout, no injury EXAM: LEFT ANKLE COMPLETE - 3+ VIEW COMPARISON:  None. FINDINGS: There is no evidence of fracture, dislocation, or joint effusion. There is no evidence of arthropathy or other focal bone abnormality. Soft tissues are unremarkable. IMPRESSION: Negative. Electronically Signed   By: Lucrezia Europe M.D.   On: 04/30/2016 15:07    Radiology No results found.  Procedures Procedures (including critical care time)  Medications Ordered in ED Medications  albuterol (PROVENTIL) (2.5 MG/3ML) 0.083% nebulizer solution 2.5 mg (2.5 mg Nebulization Given 05/16/16 0044)     Initial Impression / Assessment and Plan / ED Course  I have reviewed the triage vital signs and the nursing notes.  Pertinent labs & imaging results that were available during my care of the patient were reviewed by me and considered in my medical decision making (see chart for details).  Clinical Course    1:25 AM breathing normally  after treatment with albuterol nebulized treatment. Plan albuterol HFA with spacer to go to use 2 puffs every 4 hours as needed for cough or shortness of breath. Return if needed more than every 4 hours or see PMD Blood pressure recheck one week. Patient reportedly had not taken her benazepril as scheduled earlier tonight.  Final Clinical Impressions(s) / ED Diagnoses  Diagnosis #1 influenza-like illness #2 elevated blood pressure Final diagnoses:  None    New Prescriptions New Prescriptions   No medications on file     Orlie Dakin, MD 05/16/16 [phone]

## 2016-05-16 NOTE — Discharge Instructions (Signed)
Use your albuterol inhaler with spacer 2 puffs every 4 hours as needed for cough or shortness of breath. Return if needed more than every 4 hours or see your primary care physician. Take your blood pressure medication as directed. Your blood pressure was elevated tonight at 203/82. Your blood pressure should be rechecked at your doctor's office within a week. Return if concern for any reason.

## 2016-05-16 NOTE — ED Notes (Signed)
RT in room.

## 2016-05-16 NOTE — ED Notes (Signed)
Pt in xray

## 2016-06-06 ENCOUNTER — Other Ambulatory Visit (HOSPITAL_COMMUNITY): Payer: Self-pay | Admitting: Nurse Practitioner

## 2016-06-06 ENCOUNTER — Telehealth (HOSPITAL_COMMUNITY): Payer: Self-pay | Admitting: *Deleted

## 2016-06-06 MED ORDER — DILTIAZEM HCL 30 MG PO TABS: ORAL_TABLET | ORAL | 2 refills | Status: DC

## 2016-06-06 NOTE — Telephone Encounter (Signed)
Patient been in afib since last night. HR in the 90s (her normal is in 60s) BP elevated 161/84. Took her morning medications about 2 hours ago but did not touch heart rate. Discussed with Kathryn Palau NP - will take extra 50mg  of flecainide this afternoon and call in cardizem 30mg  tablets to use as needed for elevated heart rate. Appt made for Monday to follow up. Pt verbalized understanding of instructions and ER recommendations reviewed if needed over weekend.

## 2016-06-09 ENCOUNTER — Ambulatory Visit (HOSPITAL_COMMUNITY)
Admission: RE | Admit: 2016-06-09 | Discharge: 2016-06-09 | Disposition: A | Discharge status: Home / Self Care | Payer: Medicare Other | Source: Ambulatory Visit | Attending: Nurse Practitioner | Admitting: Nurse Practitioner

## 2016-06-09 ENCOUNTER — Encounter (HOSPITAL_COMMUNITY): Payer: Self-pay | Admitting: Nurse Practitioner

## 2016-06-09 VITALS — BP 146/84 | HR 92 | Ht 63.0 in | Wt 173.0 lb

## 2016-06-09 DIAGNOSIS — Z8249 Family history of ischemic heart disease and other diseases of the circulatory system: Secondary | ICD-10-CM | POA: Insufficient documentation

## 2016-06-09 DIAGNOSIS — K219 Gastro-esophageal reflux disease without esophagitis: Secondary | ICD-10-CM | POA: Diagnosis not present

## 2016-06-09 DIAGNOSIS — N184 Chronic kidney disease, stage 4 (severe): Secondary | ICD-10-CM | POA: Insufficient documentation

## 2016-06-09 DIAGNOSIS — M109 Gout, unspecified: Secondary | ICD-10-CM | POA: Insufficient documentation

## 2016-06-09 DIAGNOSIS — I481 Persistent atrial fibrillation: Secondary | ICD-10-CM | POA: Insufficient documentation

## 2016-06-09 DIAGNOSIS — E782 Mixed hyperlipidemia: Secondary | ICD-10-CM | POA: Insufficient documentation

## 2016-06-09 DIAGNOSIS — Z888 Allergy status to other drugs, medicaments and biological substances status: Secondary | ICD-10-CM | POA: Insufficient documentation

## 2016-06-09 DIAGNOSIS — I4819 Other persistent atrial fibrillation: Secondary | ICD-10-CM

## 2016-06-09 DIAGNOSIS — Z79899 Other long term (current) drug therapy: Secondary | ICD-10-CM | POA: Insufficient documentation

## 2016-06-09 DIAGNOSIS — E1122 Type 2 diabetes mellitus with diabetic chronic kidney disease: Secondary | ICD-10-CM | POA: Diagnosis not present

## 2016-06-09 DIAGNOSIS — Z794 Long term (current) use of insulin: Secondary | ICD-10-CM | POA: Diagnosis not present

## 2016-06-09 DIAGNOSIS — I129 Hypertensive chronic kidney disease with stage 1 through stage 4 chronic kidney disease, or unspecified chronic kidney disease: Secondary | ICD-10-CM | POA: Insufficient documentation

## 2016-06-09 DIAGNOSIS — Z950 Presence of cardiac pacemaker: Secondary | ICD-10-CM | POA: Insufficient documentation

## 2016-06-09 DIAGNOSIS — G4700 Insomnia, unspecified: Secondary | ICD-10-CM | POA: Insufficient documentation

## 2016-06-09 LAB — COMPREHENSIVE METABOLIC PANEL
ALBUMIN: 3.9 g/dL (ref 3.5–5.0)
ALT: 21 U/L (ref 14–54)
ANION GAP: 14 (ref 5–15)
AST: 20 U/L (ref 15–41)
Alkaline Phosphatase: 70 U/L (ref 38–126)
BILIRUBIN TOTAL: 1.1 mg/dL (ref 0.3–1.2)
BUN: 29 mg/dL — ABNORMAL HIGH (ref 6–20)
CO2: 22 mmol/L (ref 22–32)
Calcium: 9.6 mg/dL (ref 8.9–10.3)
Chloride: 101 mmol/L (ref 101–111)
Creatinine, Ser: 1.61 mg/dL — ABNORMAL HIGH (ref 0.44–1.00)
GFR calc non Af Amer: 29 mL/min — ABNORMAL LOW (ref >60)
GFR, EST AFRICAN AMERICAN: 34 mL/min — AB (ref >60)
GLUCOSE: 358 mg/dL — AB (ref 65–99)
POTASSIUM: 3.6 mmol/L (ref 3.5–5.1)
SODIUM: 137 mmol/L (ref 135–145)
TOTAL PROTEIN: 6.5 g/dL (ref 6.5–8.1)

## 2016-06-09 LAB — MAGNESIUM: Magnesium: 2 mg/dL (ref 1.7–2.4)

## 2016-06-09 LAB — TSH: TSH: 2.629 u[IU]/mL (ref 0.350–4.500)

## 2016-06-09 NOTE — Patient Instructions (Signed)
Butch Penny will be in touch with you once she speaks with Dr. Lovena Le.

## 2016-06-09 NOTE — Progress Notes (Addendum)
Patient ID: Kathryn Richardson, female   DOB: 1935-01-24, 82 y.o.   MRN: [id number]      Primary Care Physician: Gara Kroner, MD Referring Physician: Dr Irish Lack EP: Dr. Haydee Salter is a 82 y.o. female with a h/o persistent atrial fibrillation and bradycardia  receiving a PPM 12/16 for syncope, thought maybe caused from 1:1 aflutter.  She was discharged in afib on flecainide 50 mg  and diltiazem 240 mg qd was added for rate control.  However she remained in afib. Flecainide was increased to 100 mg bid without converting pt to SR and level of flecainide was  high on this dose, so she was reduced to 75 mg bid.   Most recently, she presented for cardioversion 04/09/16, after a prednisone taper for gout,  which was successful but unfortunately went back into afib last week. An extra dose of 50 mg flecainide and extra Cardizem did not convert her. She is symptomatic with fatigue in afib. She states that she has been under stress with the bitter divorce of her grandson. No missed does of eliquis.  Today, she denies symptoms of  chest pain , orthopnea, PND, lower extremity edema, presyncope, syncope, or neurologic sequela.Positive for fatigue.The patient is tolerating medications without difficulties and is otherwise without complaint today.   Past Medical History:  Diagnosis Date  . Allergic rhinitis   . Atrial fibrillation ([place])   . Chronic renal disease, stage IV ([place])   . Essential hypertension   . GERD (gastroesophageal reflux disease)   . Gout   . Hyperlipemia, mixed   . Insomnia   . Type 2 diabetes mellitus ([place])    Past Surgical History:  Procedure Laterality Date  . CARDIOVERSION N/A 05/29/2014   Procedure: CARDIOVERSION;  Surgeon: Fay Records, MD;  Location: [facility] ENDOSCOPY;  Service: Cardiovascular;  Laterality: N/A;  . CARDIOVERSION N/A 11/03/2014   Procedure: CARDIOVERSION;  Surgeon: Thayer Headings, MD;  Location: [place];  Service: Cardiovascular;  Laterality: N/A;   . CARDIOVERSION N/A 04/14/2016   Procedure: CARDIOVERSION;  Surgeon: Jerline Pain, MD;  Location: [place];  Service: Cardiovascular;  Laterality: N/A;  . CESAREAN SECTION     x2  . ESOPHAGEAL DILATION    . PERMANENT PACEMAKER INSERTION N/A 04/26/2014   Procedure: PERMANENT PACEMAKER INSERTION;  Surgeon: Evans Lance, MD;  Location: [facility] CATH LAB;  Service: Cardiovascular;  Laterality: N/A;  . TEE WITHOUT CARDIOVERSION N/A 05/29/2014   Procedure: TRANSESOPHAGEAL ECHOCARDIOGRAM (TEE);  Surgeon: Fay Records, MD;  Location: [facility] ENDOSCOPY;  Service: Cardiovascular;  Laterality: N/A;    Current Outpatient Prescriptions  Medication Sig Dispense Refill  . allopurinol (ZYLOPRIM) 100 MG tablet Take 200 mg by mouth at bedtime.     Marland Kitchen atorvastatin (LIPITOR) 80 MG tablet Take 80 mg by mouth at bedtime.     . benazepril (LOTENSIN) 20 MG tablet Take 20 mg by mouth daily.    Marland Kitchen diltiazem (CARDIZEM CD) 240 MG 24 hr capsule TAKE 1 CAPSULE BY MOUTH DAILY 90 capsule 0  . diltiazem (CARDIZEM) 30 MG tablet Take 1 tablet every 4 hours AS NEEDED for afib heart rate 90 or greater. 45 tablet 2  . ELIQUIS 2.5 MG TABS tablet TAKE 1 TABLET BY MOUTH TWICE DAILY 60 tablet 11  . flecainide (TAMBOCOR) 50 MG tablet TAKE 1 AND 1/2 TABLET BY MOUTH EVERY 12 HOURS 270 tablet 0  . furosemide (LASIX) 40 MG tablet Take 40 mg by mouth daily.    Marland Kitchen  Insulin Detemir (LEVEMIR [place]) Inject into the skin. 36 UNITS IN THE MORNING    . KLOR-CON M20 20 MEQ tablet Take 20 mEq by mouth daily.  3  . levothyroxine (SYNTHROID, LEVOTHROID) 50 MCG tablet Take 50 mcg by mouth daily. On a empty stomach.    . repaglinide (PRANDIN) 1 MG tablet Take 1 mg by mouth 2 (two) times daily before a meal.      No current facility-administered medications for this encounter.     Allergies  Allergen Reactions  . Cortisone Other (See Comments)    Hyperglycemia   . Prednisone Other (See Comments)    Hyperglycemia     Social History   Social History    . Marital status: Married    Spouse name: N/A  . Number of children: N/A  . Years of education: N/A   Occupational History  . Not on file.   Social History Main Topics  . Smoking status: Never Smoker  . Smokeless tobacco: Never Used  . Alcohol use No  . Drug use: No  . Sexual activity: Not on file   Other Topics Concern  . Not on file   Social History Narrative   Lives in [place].  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 Coronary artery disease Father 75  . Heart attack Father   . Congestive Heart Failure Mother   . Alzheimer's disease Mother    Physical Exam: There were no vitals filed for this visit.  GEN- The patient is well appearing, alert and oriented x 3 today.   Head- normocephalic, atraumatic Eyes-  Sclera clear, conjunctiva pink Ears- hearing intact Oropharynx- clear Neck- supple, no JVP Lymph- no cervical lymphadenopathy Lungs- Clear to ausculation bilaterally, normal work of breathing Heart-irregular rate and rhythm, no murmurs, rubs or gallops, PMI not laterally displaced. Pacer site well healed. GI- soft, NT, ND, + BS Extremities- no clubbing, cyanosis, or edema   EKG -atrial fib with ILBBB, LVH, st changes, unchanged from previous EKG's in afib, qrs int 106 ms, qtc 477 ms Epic records reviewed Echo- Study Conclusions  - Left ventricle: The cavity size was normal. Wall thickness was   normal. Systolic function was normal. The estimated ejection   fraction was in the range of 50% to 55%. Wall motion was normal;   there were no regional wall motion abnormalities. Doppler   parameters are consistent with high ventricular filling pressure. - Mitral valve: Calcified annulus. There was mild regurgitation. - Left atrium: The atrium was mildly dilated. - Tricuspid valve: There was moderate regurgitation. - Pulmonary arteries: Systolic pressure was mildly increased. PA   peak  pressure: 35 mm Hg (S).  Impressions:  - Normal LV systolic function; mild MR; mld LAE; moderate TR;   mildly elevated pulmonary pressure.  Assessment and Plan:  1.Symptomatic Persistent afib   Had maintained SR until cardioversion end of November and now in afib again Discussed with pt repeat cardioversion, which I have my doubts of long term ability to maintain SR,  or changing antiarrythmic's. Unfortunately, pt has renal dysfunction and she would only be eligible for 125 mg tikosyn bid with cr cl cal at 34.88, which may not be enough drug to keep pt in rhythm  Renal cr less than 40 contraindicated with sotalol She is not against amiodarone but does have thyroid issues at baseline,on replacement. Would need baseline PFT's before use She had talked to Dr.  Allred in the past re ablation but he was concerned re kidney function, so did not offer the procedure Continue flecainide, cardizem for now Reminded not to miss doses of apixaban Tsh, cmet, mag today  2. PPM  Per Dr. Lovena Le  3. HTN Stable F/u in afib clinic as needed  Pt will think over options and let me know her thoughts in   [place]. Samarie Pinder, [facility] [address] [place], [place] [address] [phone]   I discussed pt with Dr. Lovena Le and he thought that even though renal function will not allow but 125 meq bid of Tikosyn, he has had some pt's that have good response to that dose but she may get better long term maintenance of SR with amiodarone. Pt did find out cost of drug and is willing to pay $140.00 or so per month. She would like to avoid amio for now but may reconsider if Tikosyn fails. Qtc is prolonged past 460 ms on some EKG's with LBBB, will have to be signed off by EP to see if baseline qtc is acceptable.

## 2016-06-10 ENCOUNTER — Other Ambulatory Visit (HOSPITAL_COMMUNITY): Payer: Self-pay | Admitting: *Deleted

## 2016-06-10 ENCOUNTER — Telehealth: Payer: Self-pay | Admitting: Pharmacist

## 2016-06-10 NOTE — Telephone Encounter (Signed)
Spoke with pt to schedule Tikosyn for next week. Pt was advised to restart taking [place]-Con 64meq daily. Will have her take 8meq daily for the 2 days prior to admit on 2/5. Pt advised to take last dose of flecainide on 2/1 for 3 day washout prior to Tikosyn start. Pt confirmed copay for generic Tikosyn is $144 per month and is pricey but affordable. Has not missed any doses of Eliquis in the past month. She was switched from warfarin to Eliquis 1 year ago and states that she has always been on the 2.5mg  dose. Her SCr is 1.61 but wt is 78kg and pt is 82 years old. Appropriate dosing is 5mg  BID for the next 5 months until pt turns 82. Discussed with Dr Lovena Le, pt's EP, to confirm that he is ok with pt starting Tikosyn next week instead of waiting 3 weeks for appropriate anticoagulation (of note - pt did have a DCCV 1 month ago on the 2.5mg  dose). He stated that since pt turns 82 in 5 months, it is ok to continue with Tikosyn while pt remains on Eliquis 2.5mg  BID dose with no TEE prior.

## 2016-06-10 NOTE — Addendum Note (Signed)
Encounter addended by: Sherran Needs, NP on: 06/10/2016 12:48 PM<BR>    Actions taken: Sign clinical note

## 2016-06-11 DIAGNOSIS — Z808 Family history of malignant neoplasm of other organs or systems: Secondary | ICD-10-CM | POA: Diagnosis not present

## 2016-06-11 DIAGNOSIS — Z23 Encounter for immunization: Secondary | ICD-10-CM | POA: Diagnosis not present

## 2016-06-11 DIAGNOSIS — C44319 Basal cell carcinoma of skin of other parts of face: Secondary | ICD-10-CM | POA: Diagnosis not present

## 2016-06-11 DIAGNOSIS — D2261 Melanocytic nevi of right upper limb, including shoulder: Secondary | ICD-10-CM | POA: Diagnosis not present

## 2016-06-11 DIAGNOSIS — D485 Neoplasm of uncertain behavior of skin: Secondary | ICD-10-CM | POA: Diagnosis not present

## 2016-06-11 DIAGNOSIS — L821 Other seborrheic keratosis: Secondary | ICD-10-CM | POA: Diagnosis not present

## 2016-06-11 DIAGNOSIS — Z85828 Personal history of other malignant neoplasm of skin: Secondary | ICD-10-CM | POA: Diagnosis not present

## 2016-06-12 DIAGNOSIS — M109 Gout, unspecified: Secondary | ICD-10-CM | POA: Diagnosis not present

## 2016-06-12 DIAGNOSIS — E1122 Type 2 diabetes mellitus with diabetic chronic kidney disease: Secondary | ICD-10-CM | POA: Diagnosis not present

## 2016-06-12 DIAGNOSIS — N184 Chronic kidney disease, stage 4 (severe): Secondary | ICD-10-CM | POA: Diagnosis not present

## 2016-06-12 DIAGNOSIS — Z794 Long term (current) use of insulin: Secondary | ICD-10-CM | POA: Diagnosis not present

## 2016-06-16 ENCOUNTER — Ambulatory Visit (INDEPENDENT_AMBULATORY_CARE_PROVIDER_SITE_OTHER): Payer: Medicare Other | Admitting: Pharmacist

## 2016-06-16 ENCOUNTER — Inpatient Hospital Stay (HOSPITAL_COMMUNITY)
Admission: AD | Admit: 2016-06-16 | Discharge: 2016-06-19 | DRG: 309 | Disposition: A | Discharge status: Home / Self Care | Payer: Medicare Other | Attending: Internal Medicine | Admitting: Internal Medicine

## 2016-06-16 VITALS — Wt 172.5 lb

## 2016-06-16 DIAGNOSIS — E1122 Type 2 diabetes mellitus with diabetic chronic kidney disease: Secondary | ICD-10-CM | POA: Diagnosis present

## 2016-06-16 DIAGNOSIS — I129 Hypertensive chronic kidney disease with stage 1 through stage 4 chronic kidney disease, or unspecified chronic kidney disease: Secondary | ICD-10-CM | POA: Diagnosis present

## 2016-06-16 DIAGNOSIS — I481 Persistent atrial fibrillation: Principal | ICD-10-CM | POA: Diagnosis present

## 2016-06-16 DIAGNOSIS — N184 Chronic kidney disease, stage 4 (severe): Secondary | ICD-10-CM | POA: Diagnosis present

## 2016-06-16 DIAGNOSIS — Z888 Allergy status to other drugs, medicaments and biological substances status: Secondary | ICD-10-CM | POA: Diagnosis not present

## 2016-06-16 DIAGNOSIS — K219 Gastro-esophageal reflux disease without esophagitis: Secondary | ICD-10-CM | POA: Diagnosis present

## 2016-06-16 DIAGNOSIS — Z7901 Long term (current) use of anticoagulants: Secondary | ICD-10-CM | POA: Diagnosis not present

## 2016-06-16 DIAGNOSIS — E782 Mixed hyperlipidemia: Secondary | ICD-10-CM | POA: Diagnosis present

## 2016-06-16 DIAGNOSIS — Z5181 Encounter for therapeutic drug level monitoring: Secondary | ICD-10-CM | POA: Diagnosis not present

## 2016-06-16 DIAGNOSIS — Z794 Long term (current) use of insulin: Secondary | ICD-10-CM

## 2016-06-16 DIAGNOSIS — Z79899 Other long term (current) drug therapy: Secondary | ICD-10-CM

## 2016-06-16 DIAGNOSIS — I480 Paroxysmal atrial fibrillation: Secondary | ICD-10-CM | POA: Diagnosis not present

## 2016-06-16 DIAGNOSIS — Z8249 Family history of ischemic heart disease and other diseases of the circulatory system: Secondary | ICD-10-CM | POA: Diagnosis not present

## 2016-06-16 LAB — BASIC METABOLIC PANEL
BUN / CREAT RATIO: 16 (ref 12–28)
BUN: 23 mg/dL (ref 8–27)
CO2: 24 mmol/L (ref 18–29)
CREATININE: 1.46 mg/dL — AB (ref 0.57–1.00)
Calcium: 9.1 mg/dL (ref 8.7–10.3)
Chloride: 104 mmol/L (ref 96–106)
GFR calc Af Amer: 39 mL/min/{1.73_m2} — ABNORMAL LOW (ref >59)
GFR calc non Af Amer: 34 mL/min/{1.73_m2} — ABNORMAL LOW (ref >59)
GLUCOSE: 281 mg/dL — AB (ref 65–99)
Potassium: 4.6 mmol/L (ref 3.5–5.2)
Sodium: 138 mmol/L (ref 134–144)

## 2016-06-16 LAB — MAGNESIUM: MAGNESIUM: 1.9 mg/dL (ref 1.6–2.3)

## 2016-06-16 LAB — GLUCOSE, CAPILLARY
Glucose-Capillary: 194 mg/dL — ABNORMAL HIGH (ref 65–99)
Glucose-Capillary: 132 mg/dL — ABNORMAL HIGH (ref 65–99)

## 2016-06-16 MED ORDER — INSULIN DETEMIR 100 UNIT/ML ~~LOC~~ SOLN
36.0000 [IU] | Freq: Every day | SUBCUTANEOUS | Status: DC
  Administered 2016-06-17 – 2016-06-19 (×3): 36 [IU] via SUBCUTANEOUS
  Filled 2016-06-16 (×3): qty 0.36

## 2016-06-16 MED ORDER — BENAZEPRIL HCL 10 MG PO TABS
20.0000 mg | ORAL_TABLET | Freq: Every day | ORAL | Status: DC
  Administered 2016-06-16 – 2016-06-19 (×4): 20 mg via ORAL
  Filled 2016-06-16 (×4): qty 2

## 2016-06-16 MED ORDER — SODIUM CHLORIDE 0.9% FLUSH
3.0000 mL | Freq: Two times a day (BID) | INTRAVENOUS | Status: DC
  Administered 2016-06-16 – 2016-06-17 (×2): 3 mL via INTRAVENOUS

## 2016-06-16 MED ORDER — ATORVASTATIN CALCIUM 80 MG PO TABS
80.0000 mg | ORAL_TABLET | Freq: Every day | ORAL | Status: DC
  Administered 2016-06-16 – 2016-06-18 (×3): 80 mg via ORAL
  Filled 2016-06-16 (×3): qty 1

## 2016-06-16 MED ORDER — FUROSEMIDE 40 MG PO TABS
40.0000 mg | ORAL_TABLET | Freq: Every day | ORAL | Status: DC
  Administered 2016-06-17 – 2016-06-19 (×3): 40 mg via ORAL
  Filled 2016-06-16 (×3): qty 1

## 2016-06-16 MED ORDER — POTASSIUM CHLORIDE CRYS ER 20 MEQ PO TBCR
20.0000 meq | EXTENDED_RELEASE_TABLET | Freq: Every day | ORAL | Status: DC
  Administered 2016-06-17 – 2016-06-19 (×3): 20 meq via ORAL
  Filled 2016-06-16 (×3): qty 1

## 2016-06-16 MED ORDER — APIXABAN 2.5 MG PO TABS
2.5000 mg | ORAL_TABLET | Freq: Two times a day (BID) | ORAL | Status: DC
  Administered 2016-06-16 – 2016-06-19 (×6): 2.5 mg via ORAL
  Filled 2016-06-16 (×6): qty 1

## 2016-06-16 MED ORDER — MAGNESIUM SULFATE IN D5W 1-5 GM/100ML-% IV SOLN
1.0000 g | Freq: Once | INTRAVENOUS | Status: AC
  Administered 2016-06-16: 1 g via INTRAVENOUS
  Filled 2016-06-16: qty 100

## 2016-06-16 MED ORDER — REPAGLINIDE 1 MG PO TABS
1.0000 mg | ORAL_TABLET | Freq: Two times a day (BID) | ORAL | Status: DC
  Administered 2016-06-17 – 2016-06-19 (×5): 1 mg via ORAL
  Filled 2016-06-16 (×12): qty 1

## 2016-06-16 MED ORDER — SODIUM CHLORIDE 0.9% FLUSH: 3.0000 mL | INTRAVENOUS | Status: DC | PRN

## 2016-06-16 MED ORDER — DILTIAZEM HCL ER COATED BEADS 240 MG PO CP24
240.0000 mg | ORAL_CAPSULE | Freq: Every day | ORAL | Status: DC
Start: 2016-06-17 — End: 2016-06-19
  Administered 2016-06-17 – 2016-06-19 (×3): 240 mg via ORAL
  Filled 2016-06-16 (×3): qty 1

## 2016-06-16 MED ORDER — DOFETILIDE 125 MCG PO CAPS
125.0000 ug | ORAL_CAPSULE | Freq: Two times a day (BID) | ORAL | Status: DC
  Administered 2016-06-16 – 2016-06-19 (×6): 125 ug via ORAL
  Filled 2016-06-16 (×6): qty 1

## 2016-06-16 MED ORDER — LEVOTHYROXINE SODIUM 50 MCG PO TABS
50.0000 ug | ORAL_TABLET | Freq: Every day | ORAL | Status: DC
Start: 2016-06-17 — End: 2016-06-19
  Administered 2016-06-17 – 2016-06-19 (×3): 50 ug via ORAL
  Filled 2016-06-16 (×3): qty 1

## 2016-06-16 MED ORDER — ALLOPURINOL 100 MG PO TABS
200.0000 mg | ORAL_TABLET | Freq: Every day | ORAL | Status: DC
  Administered 2016-06-16 – 2016-06-18 (×3): 200 mg via ORAL
  Filled 2016-06-16 (×3): qty 2

## 2016-06-16 MED ORDER — INSULIN ASPART 100 UNIT/ML ~~LOC~~ SOLN
0.0000 [IU] | Freq: Three times a day (TID) | SUBCUTANEOUS | Status: DC
  Administered 2016-06-16 – 2016-06-17 (×2): 2 [IU] via SUBCUTANEOUS
  Administered 2016-06-17: 3 [IU] via SUBCUTANEOUS
  Administered 2016-06-17: 1 [IU] via SUBCUTANEOUS
  Administered 2016-06-18: 2 [IU] via SUBCUTANEOUS
  Administered 2016-06-18 – 2016-06-19 (×2): 3 [IU] via SUBCUTANEOUS
  Administered 2016-06-19: 2 [IU] via SUBCUTANEOUS

## 2016-06-16 MED ORDER — SODIUM CHLORIDE 0.9 % IV SOLN: 250.0000 mL | INTRAVENOUS | Status: DC | PRN

## 2016-06-16 NOTE — Progress Notes (Signed)
Pharmacy Review for Dofetilide (Tikosyn) Initiation  Admit Complaint: 82 y.o. female admitted 06/16/2016 with atrial fibrillation to be initiated on dofetilide. Patient was seen/counseled by Fuller Canada, Pharm.D prior to initiation (see note from 06/10/16).  Assessment:  Patient Exclusion Criteria: If any screening criteria checked as "Yes", then  patient  should NOT receive dofetilide until criteria item is corrected. If "Yes" please indicate correction plan.  YES  NO Patient  Exclusion Criteria Correction Plan  []  [x]  Baseline QTc interval is greater than or equal to 440 msec. IF above YES box checked dofetilide contraindicated unless patient has ICD; then may proceed if QTc 500-550 msec or with known ventricular conduction abnormalities may proceed with QTc 550-600 msec. QTc =   QTc 430 on outpatient EKG  []  [x]  Magnesium level is less than 1.8 mEq/l : Last magnesium:  Lab Results  Component Value Date   MG 1.9 06/16/2016         []  [x]  Potassium level is less than 4 mEq/l : Last potassium:  Lab Results  Component Value Date   K 4.6 06/16/2016         []  [x]  Patient is known or suspected to have a digoxin level greater than 2 ng/ml: No results found for: DIGOXIN    []  [x]  Creatinine clearance less than 20 ml/min (calculated using Cockcroft-Gault, actual body weight and serum creatinine): Estimated Creatinine Clearance: 30.9 mL/min (by C-G formula based on SCr of 1.46 mg/dL (H)).  CrCl estimated at 38 ml/min using TBW.  []  [x]  Patient has received drugs known to prolong the QT intervals within the last 48 hours (phenothiazines, tricyclics or tetracyclic antidepressants, erythromycin, H-1 antihistamines, cisapride, fluoroquinolones, azithromycin). Drugs not listed above may have an, as yet, undetected potential to prolong the QT interval, updated information on QT prolonging agents is available at this website:QT prolonging agents   []  [x]  Patient received a dose of  hydrochlorothiazide (Oretic) alone or in any combination including triamterene (Dyazide, Maxzide) in the last 48 hours.   []  [x]  Patient received a medication known to increase dofetilide plasma concentrations prior to initial dofetilide dose:  . Trimethoprim (Primsol, Proloprim) in the last 36 hours . Verapamil (Calan, Verelan) in the last 36 hours or a sustained release dose in the last 72 hours . Megestrol (Megace) in the last 5 days  . Cimetidine (Tagamet) in the last 6 hours . Ketoconazole (Nizoral) in the last 24 hours . Itraconazole (Sporanox) in the last 48 hours  . Prochlorperazine (Compazine) in the last 36 hours    []  [x]  Patient is known to have a history of torsades de pointes; congenital or acquired long QT syndromes.   []  [x]  Patient has received a Class 1 antiarrhythmic with less than 2 half-lives since last dose. (Disopyramide, Quinidine, Procainamide, Lidocaine, Mexiletine, Flecainide, Propafenone) Last Flecainide dose 06/12/16  []  [x]  Patient has received amiodarone therapy in the past 3 months or amiodarone level is greater than 0.3 ng/ml.    Patient has been appropriately anticoagulated with Eliquis 2.5mg  PO BID (MD aware of lower dose).  Ordering provider was confirmed at [email or web address] if they are not listed on the [place] Prescribers list.  Goal of Therapy: Follow renal function, electrolytes, potential drug interactions, and dose adjustment. Provide education and 1 week supply at discharge.  Plan:  [x]   Physician selected initial dose within range recommended for patients level of renal function - will monitor for response.  []   Physician selected initial dose outside of range recommended  for patients level of renal function - will discuss if the dose should be altered at this time.   Select One Calculated CrCl  Dose q12h  []  > 60 ml/min 500 mcg  []  40-60 ml/min 250 mcg  [x]  20-40 ml/min 125 mcg   2. Follow up QTc after the first 5 doses, renal  function, electrolytes (K & Mg) daily x 3 days, dose adjustment, success of initiation and facilitate 1 week discharge supply as clinically indicated.  3. Initiate Tikosyn education video (Call [phone] and ask for video # 116).  [employer], Pharm.D., BCPS Clinical Pharmacist Pager [phone] 06/16/2016 4:34 PM

## 2016-06-16 NOTE — H&P (Signed)
H&P    Patient ID: Kathryn Richardson MRN: [id number], DOB/AGE: 1934-08-21 82 y.o.  Admit date: 06/16/2016 Date of Admit: 06/16/2016  Primary Physician: Gara Kroner, MD Primary Cardiologist: Dr. Irish Lack Electrophysiologist: Dr. Lovena Le  Reason for Admission: Tikosyn load  HPI: Kathryn Richardson is a 82 y.o. female with PMHx of persistent AFib, bradycardia w/PPM, HTN, GERD, HLD, DM presents today for Tikosyn initiation.  The patient was seen by the AFib clinic and in discussion with Dr. Lovena Le was recommended to pursue Tikosyn.  The patient was communicated with by [facility] [place] and advised to stop her Flecainide 06/12/16. The patient was seen today by Eino Farber, [facility], the patient confirmed her last dose of Flecainide was 06/11/16.  The patient is on Eliquis 2.5mg  BID dose, notes in the chart discuss that Dr. Lovena Le is aware of this, given she is almost 82y/o (which would make 2.5 dose appropriate) cleared to pursue Tikosyn at current dose.  EKG was done in-clinic today, reviewed by Dr. Lovena Le and cleared to start at 163mcg BID dose, given renal function.  The patient is seen by Dr. Curt Bears, feeling well, had no follow-up questions regarding Tikosyn protocol. The patient confirms no missed dose of her Eliquis, she confirms her last dose of Flecainide was in-fact 06/12/16.  She tells me she was given Tikosyn education and what to expect while here/load process and possible DCCV Wed, if still in SR.   Past Medical History:  Diagnosis Date  . Allergic rhinitis   . Atrial fibrillation ([place])   . Chronic renal disease, stage IV ([place])   . Essential hypertension   . GERD (gastroesophageal reflux disease)   . Gout   . Hyperlipemia, mixed   . Insomnia   . Type 2 diabetes mellitus [facility])      Surgical History:  Past Surgical History:  Procedure Laterality Date  . CARDIOVERSION N/A 05/29/2014   Procedure: CARDIOVERSION;  Surgeon: Fay Records, MD;  Location: [facility] ENDOSCOPY;  Service: Cardiovascular;   Laterality: N/A;  . CARDIOVERSION N/A 11/03/2014   Procedure: CARDIOVERSION;  Surgeon: Thayer Headings, MD;  Location: [place];  Service: Cardiovascular;  Laterality: N/A;  . CARDIOVERSION N/A 04/14/2016   Procedure: CARDIOVERSION;  Surgeon: Jerline Pain, MD;  Location: [place];  Service: Cardiovascular;  Laterality: N/A;  . CESAREAN SECTION     x2  . ESOPHAGEAL DILATION    . PERMANENT PACEMAKER INSERTION N/A 04/26/2014   Procedure: PERMANENT PACEMAKER INSERTION;  Surgeon: Evans Lance, MD;  Location: [facility] CATH LAB;  Service: Cardiovascular;  Laterality: N/A;  . TEE WITHOUT CARDIOVERSION N/A 05/29/2014   Procedure: TRANSESOPHAGEAL ECHOCARDIOGRAM (TEE);  Surgeon: Fay Records, MD;  Location: [place];  Service: Cardiovascular;  Laterality: N/A;     Prescriptions Prior to Admission  Medication Sig Dispense Refill Last Dose  . allopurinol (ZYLOPRIM) 100 MG tablet Take 200 mg by mouth at bedtime.    Taking  . atorvastatin (LIPITOR) 80 MG tablet Take 80 mg by mouth at bedtime.    Taking  . benazepril (LOTENSIN) 20 MG tablet Take 20 mg by mouth daily.   Taking  . diltiazem (CARDIZEM CD) 240 MG 24 hr capsule TAKE 1 CAPSULE BY MOUTH DAILY 90 capsule 0 Taking  . diltiazem (CARDIZEM) 30 MG tablet Take 1 tablet every 4 hours AS NEEDED for afib heart rate 90 or greater. (Patient not taking: Reported on 06/16/2016) 45 tablet 2 Not Taking  . ELIQUIS 2.5 MG TABS tablet TAKE 1 TABLET  BY MOUTH TWICE DAILY 60 tablet 11 Taking  . furosemide (LASIX) 40 MG tablet Take 40 mg by mouth daily.   Taking  . Insulin Detemir (LEVEMIR [place]) Inject into the skin. 48 UNITS IN THE MORNING   Taking  . KLOR-CON M20 20 MEQ tablet Take 20 mEq by mouth daily.  3 Taking  . levothyroxine (SYNTHROID, LEVOTHROID) 50 MCG tablet Take 50 mcg by mouth daily. On a empty stomach.   Taking  . repaglinide (PRANDIN) 1 MG tablet Take 1 mg by mouth 2 (two) times daily before a meal.    Taking    Inpatient Medications:    Allergies:  Allergies  Allergen Reactions  . Cortisone Other (See Comments)    Hyperglycemia   . Prednisone Other (See Comments)    Hyperglycemia     Social History   Social History  . Marital status: Married    Spouse name: N/A  . Number of children: N/A  . Years of education: N/A   Occupational History  . Not on file.   Social History Main Topics  . Smoking status: Never Smoker  . Smokeless tobacco: Never Used  . Alcohol use No  . Drug use: No  . Sexual activity: Not on file   Other Topics Concern  . Not on file   Social History Narrative   Lives in [place].  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 Coronary artery disease Father 38  . Heart attack Father   . Congestive Heart Failure Mother   . Alzheimer's disease Mother      Review of Systems: All other systems reviewed and are otherwise negative except as noted above.  Physical Exam: Vitals:   06/16/16 1522  BP: 140/88  Pulse: 85  Resp: 16  Temp: 97.9 F (36.6 C)  TempSrc: Oral  SpO2: 98%  Weight: 172 lb (78 kg)  Height: 5\' 3"  (1.6 m)    GEN- The patient is well appearing, alert and oriented x 3 today.   HEENT: normocephalic, atraumatic; sclera clear, conjunctiva pink; hearing intact; oropharynx clear; neck supple, no JVP Lymph- no cervical lymphadenopathy Lungs- Clear to ausculation bilaterally, normal work of breathing.  No wheezes, rales, rhonchi Heart- IRRR, no murmurs, rubs or gallops, PMI not laterally displaced GI- soft, non-tender, non-distended Extremities- no clubbing, cyanosis, or edema MS- no significant deformity or atrophy Skin- warm and dry, no rash or lesion Psych- euthymic mood, full affect Neuro- no gross deficits observed  Labs:   Lab Results  Component Value Date   WBC 12.0 (H) 04/09/2016   HGB 15.6 (H) 04/09/2016   HCT 45.6 04/09/2016   MCV 95.6 04/09/2016   PLT 250 04/09/2016    Recent  Labs Lab 06/16/16 0924  NA 138  K 4.6  CL 104  CO2 24  BUN 23  CREATININE 1.46*  CALCIUM 9.1  GLUCOSE 281*      Radiology/Studies: No results found.  EKG: done today is AFib, 88bpm, reviewed by Dr. Lovena Le, Qtc 445ms TELEMETRY: AFib, 8's currently   Assessment and Plan:   1. Persistent AFib     CHA2DS2Vasc is 5, on Eliquis      (by notes, dose confirmed with Dr. Lovena Le and [facility] to proceed./continue with 2.5mg  dose)     K+ 4.6     Mag 1.9, Kathryn Richardson replace     Creat 1.46 (Cal. Cr. Clearance = 39), start dose 141mcg BID  2. HTN     Continue home meds  3. DM     Continue home meds  4. Bradycardia     W/PPM, last checked with Dr. Lovena Le, Dec 2017, working normally   Kathryn Richardson, Kathryn Richardson 06/16/2016 3:53 PM   I have seen and examined this patient with Kathryn Richardson.  Agree with above, note added to reflect my findings.  On exam, regular rhythm, no murmurs, lungs clear.  Presented to the hospital in atrial fibrillation with plan for Tikosyn loading. Creatinine elevated and thus plan to start with 125 mcg BID. Kathryn Richardson monitor electrolytes and creatinine daily.    Kathryn Richardson M. Sandra Tellefsen MD 06/16/2016 4:42 PM

## 2016-06-16 NOTE — Progress Notes (Signed)
Patient ID: Kathryn Richardson                 DOB: 1935/01/13                    MRN: [id number]     HPI: Kathryn Richardson is a 82 y.o. female patient of Dr. Lovena Le who presents today for Tikosyn initiation. Pt with a h/o persistent atrial fibrillation and bradycardia  receiving a PPM 12/16 for syncope, thought maybe caused from 1:1 aflutter.  She was discharged in afib on flecainide 50 mg  and diltiazem 240 mg qd was added for rate control.  However she remained in afib. Flecainide was increased to 100 mg bid without converting pt to SR and level of flecainide was  high on this dose, so she was reduced to 75 mg bid.  Most recently, she presented for cardioversion 04/09/16, after a prednisone taper for gout,  which was successful but unfortunately went back into afib last week. An extra dose of 50 mg flecainide and extra Cardizem did not convert her. She is symptomatic with fatigue in afib. Per note from Roderic Palau, NP - discussed pt with Dr. Lovena Le and he thought that even though renal function will not allow but 125 meq bid of Tikosyn, he has had some pt's that have good response to that dose but she may get better long term maintenance of SR with amiodarone. Pt did find out cost of drug and is willing to pay $140.00 or so per month. She would like to avoid amio for now but may reconsider if Tikosyn fails. Qtc is prolonged past 460 ms on some EKG's with LBBB, will have to be signed off by EP to see if baseline qtc is acceptable.  Patient reports last dose of flecainide on Wednesday 06/11/16.   Pt educated on potential risks with Tikosyn including QTc prolongation. Pt is aware of the importance of not missing any doses and will call the office if they miss more than 2 doses in a row. Pt is anticoagulated with Eliquis 2.5mg  BID (see phone note from 06/10/16 for dosing details) and reports no missed doses within the past month. Pt is currently not taking any QTc prolongating or contraindicated medications.    EKG: Reviewed by Dr. Lovena Le - Abnormal ECG - Demand pacemaker, interpretation is based on intrinsic rhythm, Atrial fibrillation with premature aberrantly conducted complexes, ST &T wave abnormality, consider inferior ischemia or digitalis effect of anterolateral ischemia. Vent rate 88 bpm. QTc 452ms.   Labs: Scr 1.46mg /dL  Crcl 96mL/min  K 4.6 mmol/L  Mag 1.9 mg/dL  Past Medical History:  Diagnosis Date  . Allergic rhinitis   . Atrial fibrillation ([place])   . Chronic renal disease, stage IV ([place])   . Essential hypertension   . GERD (gastroesophageal reflux disease)   . Gout   . Hyperlipemia, mixed   . Insomnia   . Type 2 diabetes mellitus ([place])     Current Outpatient Prescriptions on File Prior to Visit  Medication Sig Dispense Refill  . allopurinol (ZYLOPRIM) 100 MG tablet Take 200 mg by mouth at bedtime.     Marland Kitchen atorvastatin (LIPITOR) 80 MG tablet Take 80 mg by mouth at bedtime.     . benazepril (LOTENSIN) 20 MG tablet Take 20 mg by mouth daily.    Marland Kitchen diltiazem (CARDIZEM CD) 240 MG 24 hr capsule TAKE 1 CAPSULE BY MOUTH DAILY 90 capsule 0  . diltiazem (CARDIZEM) 30 MG tablet Take  1 tablet every 4 hours AS NEEDED for afib heart rate 90 or greater. 45 tablet 2  . ELIQUIS 2.5 MG TABS tablet TAKE 1 TABLET BY MOUTH TWICE DAILY 60 tablet 11  . flecainide (TAMBOCOR) 50 MG tablet TAKE 1 AND 1/2 TABLET BY MOUTH EVERY 12 HOURS 270 tablet 0  . furosemide (LASIX) 40 MG tablet Take 40 mg by mouth daily.    . Insulin Detemir (LEVEMIR [place]) Inject into the skin. 36 UNITS IN THE MORNING    . KLOR-CON M20 20 MEQ tablet Take 20 mEq by mouth daily.  3  . levothyroxine (SYNTHROID, LEVOTHROID) 50 MCG tablet Take 50 mcg by mouth daily. On a empty stomach.    . repaglinide (PRANDIN) 1 MG tablet Take 1 mg by mouth 2 (two) times daily before a meal.      No current facility-administered medications on file prior to visit.     Allergies  Allergen Reactions  . Cortisone Other (See Comments)     Hyperglycemia   . Prednisone Other (See Comments)    Hyperglycemia     Assessment/Plan:  1. Atrial fibrillation - EKG reviewed and appropriate for admission per Dr. Lovena Le. Labs appropriate for admission based on Scr 1.46 , Crcl 3ml/min, K 4.6, and  Mag 1.9. Anticipate starting dose of Tikosyn 125 mcg BID based on Crcl 48mL/min. Inpatient admitting, Kathryn Richardson, notified of patient admission and pt notified of pending call with arrival time. Per admitting no bed currently available. Spoke with patient and made aware - she will monitor phone for call to head to admitting.     Thank you, Lelan Pons. Patterson Hammersmith, PharmD, BCPS, [place]  [address], [place], [place] [address]  Phone: [phone]; Fax: [phone] 06/16/2016 7:04 AM

## 2016-06-16 NOTE — Progress Notes (Signed)
Pharmacy Review for Dofetilide (Tikosyn) Initiation  Admit Complaint: 82 y.o. female admitted 06/16/2016 with atrial fibrillation to be initiated on dofetilide.   Assessment:  Patient Exclusion Criteria: If any screening criteria checked as "Yes", then  patient  should NOT receive dofetilide until criteria item is corrected. If "Yes" please indicate correction plan.  YES  NO Patient  Exclusion Criteria Correction Plan  []  [x]  Baseline QTc interval is greater than or equal to 440 msec. IF above YES box checked dofetilide contraindicated unless patient has ICD; then may proceed if QTc 500-550 msec or with known ventricular conduction abnormalities may proceed with QTc 550-600 msec. QTc =  430   []  [x]  Magnesium level is less than 1.8 mEq/l : Last magnesium:  Lab Results  Component Value Date   MG 1.9 06/16/2016         []  [x]  Potassium level is less than 4 mEq/l : Last potassium:  Lab Results  Component Value Date   K 4.6 06/16/2016         []  [x]  Patient is known or suspected to have a digoxin level greater than 2 ng/ml: No results found for: DIGOXIN    []  [x]  Creatinine clearance less than 20 ml/min (calculated using Cockcroft-Gault, actual body weight and serum creatinine): Estimated Creatinine Clearance: 30.9 mL/min (by C-G formula based on SCr of 1.46 mg/dL (H)).    []  [x]  Patient has received drugs known to prolong the QT intervals within the last 48 hours (phenothiazines, tricyclics or tetracyclic antidepressants, erythromycin, H-1 antihistamines, cisapride, fluoroquinolones, azithromycin). Drugs not listed above may have an, as yet, undetected potential to prolong the QT interval, updated information on QT prolonging agents is available at this website:QT prolonging agents   []  [x]  Patient received a dose of hydrochlorothiazide (Oretic) alone or in any combination including triamterene (Dyazide, Maxzide) in the last 48 hours.   []  [x]  Patient received a medication known to  increase dofetilide plasma concentrations prior to initial dofetilide dose:  . Trimethoprim (Primsol, Proloprim) in the last 36 hours . Verapamil (Calan, Verelan) in the last 36 hours or a sustained release dose in the last 72 hours . Megestrol (Megace) in the last 5 days  . Cimetidine (Tagamet) in the last 6 hours . Ketoconazole (Nizoral) in the last 24 hours . Itraconazole (Sporanox) in the last 48 hours  . Prochlorperazine (Compazine) in the last 36 hours    []  [x]  Patient is known to have a history of torsades de pointes; congenital or acquired long QT syndromes.   []  [x]  Patient has received a Class 1 antiarrhythmic with less than 2 half-lives since last dose. (Disopyramide, Quinidine, Procainamide, Lidocaine, Mexiletine, Flecainide, Propafenone)   []  [x]  Patient has received amiodarone therapy in the past 3 months or amiodarone level is greater than 0.3 ng/ml.    Patient has been appropriately anticoagulated with apixaban.  Ordering provider was confirmed at [email or web address] if they are not listed on the [place] Prescribers list.  Goal of Therapy: Follow renal function, electrolytes, potential drug interactions, and dose adjustment. Provide education and 1 week supply at discharge.  Plan:  [x]   Physician selected initial dose within range recommended for patients level of renal function - will monitor for response.  []   Physician selected initial dose outside of range recommended for patients level of renal function - will discuss if the dose should be altered at this time.   Select One Calculated CrCl  Dose q12h  []  > 60 ml/min 500 mcg  []   40-60 ml/min 250 mcg  [x]  20-40 ml/min 125 mcg   2. Follow up QTc after the first 5 doses, renal function, electrolytes (K & Mg) daily x 3     days, dose adjustment, success of initiation and facilitate 1 week discharge supply as     clinically indicated.  3. Initiate Tikosyn education video (Call [phone] and ask for Tikosyn Video  # 116).  4. Place Enrollment Form on the chart for discharge supply of dofetilide.   Rebecka Apley 4:28 PM 06/16/2016

## 2016-06-16 NOTE — Patient Instructions (Signed)
We will call you with lab results and when to proceed to the hospital.

## 2016-06-17 ENCOUNTER — Encounter (HOSPITAL_COMMUNITY): Payer: Self-pay

## 2016-06-17 LAB — GLUCOSE, CAPILLARY
GLUCOSE-CAPILLARY: 208 mg/dL — AB (ref 65–99)
Glucose-Capillary: 164 mg/dL — ABNORMAL HIGH (ref 65–99)
Glucose-Capillary: 220 mg/dL — ABNORMAL HIGH (ref 65–99)
Glucose-Capillary: 130 mg/dL — ABNORMAL HIGH (ref 65–99)

## 2016-06-17 LAB — BASIC METABOLIC PANEL
ANION GAP: 11 (ref 5–15)
BUN: 22 mg/dL — AB (ref 6–20)
CHLORIDE: 105 mmol/L (ref 101–111)
CO2: 24 mmol/L (ref 22–32)
Calcium: 9.1 mg/dL (ref 8.9–10.3)
Creatinine, Ser: 1.48 mg/dL — ABNORMAL HIGH (ref 0.44–1.00)
GFR calc Af Amer: 38 mL/min — ABNORMAL LOW (ref >60)
GFR calc non Af Amer: 32 mL/min — ABNORMAL LOW (ref >60)
GLUCOSE: 152 mg/dL — AB (ref 65–99)
POTASSIUM: 3.7 mmol/L (ref 3.5–5.1)
Sodium: 140 mmol/L (ref 135–145)

## 2016-06-17 LAB — MAGNESIUM: MAGNESIUM: 2.4 mg/dL (ref 1.7–2.4)

## 2016-06-17 MED ORDER — SODIUM CHLORIDE 0.9 % IV SOLN: 250.0000 mL | INTRAVENOUS | Status: DC

## 2016-06-17 MED ORDER — POTASSIUM CHLORIDE CRYS ER 20 MEQ PO TBCR
40.0000 meq | EXTENDED_RELEASE_TABLET | Freq: Once | ORAL | Status: AC
  Administered 2016-06-17: 40 meq via ORAL
  Filled 2016-06-17: qty 2

## 2016-06-17 MED ORDER — SODIUM CHLORIDE 0.9% FLUSH
3.0000 mL | Freq: Two times a day (BID) | INTRAVENOUS | Status: DC
  Administered 2016-06-17 – 2016-06-18 (×2): 3 mL via INTRAVENOUS

## 2016-06-17 MED ORDER — SODIUM CHLORIDE 0.9% FLUSH: 3.0000 mL | INTRAVENOUS | Status: DC | PRN

## 2016-06-17 NOTE — Progress Notes (Signed)
Inpatient Diabetes Program Recommendations  AACE/ADA: New Consensus Statement on Inpatient Glycemic Control (2015)  Target Ranges:  Prepandial:   less than 140 mg/dL      Peak postprandial:   less than 180 mg/dL (1-2 hours)      Critically ill patients:  140 - 180 mg/dL   Lab Results  Component Value Date   GLUCAP 220 (H) 06/17/2016   HGBA1C 10.3 (H) 05/27/2014    Review of Glycemic Control  Blood sugars > goal of 180 mg/dL. Post-prandial blood sugars elevated.    Inpatient Diabetes Program Recommendations:     Add Novolog 3 units tidwc for meal coverage insulin.  Will continue to follow. Thank you. Lorenda Peck, RD, LDN, CDE Inpatient Diabetes Coordinator [phone]

## 2016-06-17 NOTE — Discharge Instructions (Addendum)
Information on my medicine - ELIQUIS (apixaban)  Why was Eliquis prescribed for you? Eliquis was prescribed for you to reduce the risk of a blood clot forming that can cause a stroke if you have a medical condition called atrial fibrillation (a type of irregular heartbeat).  What do You need to know about Eliquis ? Take your Eliquis TWICE DAILY - one tablet in the morning and one tablet in the evening with or without food. If you have difficulty swallowing the tablet whole please discuss with your pharmacist how to take the medication safely.  Take Eliquis exactly as prescribed by your doctor and DO NOT stop taking Eliquis without talking to the doctor who prescribed the medication.  Stopping may increase your risk of developing a stroke.  Refill your prescription before you run out.  After discharge, you should have regular check-up appointments with your healthcare provider that is prescribing your Eliquis.  In the future your dose may need to be changed if your kidney function or weight changes by a significant amount or as you get older.  What do you do if you miss a dose? If you miss a dose, take it as soon as you remember on the same day and resume taking twice daily.  Do not take more than one dose of ELIQUIS at the same time to make up a missed dose.  Important Safety Information A possible side effect of Eliquis is bleeding. You should call your healthcare provider right away if you experience any of the following: ? Bleeding from an injury or your nose that does not stop. ? Unusual colored urine (red or dark brown) or unusual colored stools (red or black). ? Unusual bruising for unknown reasons. ? A serious fall or if you hit your head (even if there is no bleeding).  Some medicines may interact with Eliquis and might increase your risk of bleeding or clotting while on Eliquis. To help avoid this, consult your healthcare provider or pharmacist prior to using any new  prescription or non-prescription medications, including herbals, vitamins, non-steroidal anti-inflammatory drugs (NSAIDs) and supplements.  This website has more information on Eliquis (apixaban): http://www.eliquis.com/eliquis/home  You have an appointment set up with the [facility].  Multiple studies have shown that being followed by a dedicated atrial fibrillation clinic in addition to the standard care you receive from your other physicians improves health. We believe that enrollment in the atrial fibrillation clinic will allow [place] to better care for you.   The phone number to the [facility] is [phone]. The clinic is staffed Monday through Friday from 8:30am to 5pm.  Parking Directions: The clinic is located in the Heart and Vascular Building connected to [facility]. 1)From [address] turn on to [address] and go to the 3rd entrance  (Heart and Vascular entrance) on the right. 2)Look to the right for Heart &Vascular Parking Garage. 3)A code for the entrance is required please call the clinic to receive this.   4)Take the elevators to the 1st floor. Registration is in the room with the glass walls at the end of the hallway.  If you have any trouble parking or locating the clinic, please dont hesitate to call [phone].

## 2016-06-17 NOTE — Care Management Note (Addendum)
Case Management Note  Patient Details  Name: Kathryn Richardson MRN: [id number] Date of Birth: Nov 30, 1934  Subjective/Objective: Pt presented for Tikosyn Load. Benefits check in process for co pay. Pt prefers the generic.                      Action/Plan: CM will make pt aware of cost once completed. Pt will need a Rx for 7 day supply of Tikosyn once stable for d/c. CM will assist with 7 day supply. Pt uses [employer] [facility] [place] CM did call and the generic medication can be ordered.   Expected Discharge Date:                  Expected Discharge Plan:  Home/Self Care  In-House Referral:  NA  Discharge planning Services  CM Consult, Medication Assistance  Post Acute Care Choice:  NA Choice offered to:  NA  DME Arranged:  N/A DME Agency:  NA  HH Arranged:  NA HH Agency:  NA  Status of Service:  Completed, signed off  If discussed at [place] of Stay Meetings, dates discussed:    Additional Comments: 1003 06-19-16 [name], RN,BSN [phone] CM did send the 7 day Rx to main pharmacy. Staff RN aware and pharmacy to call Staff RN once medication ready to be picked up. No further needs from CM at this time.    S/ W [name] @ [facility] # [phone]   1. TIKOSYN  125 MCG, 250 MCG AND 500 MCG  BID  NOT COVER AND NONE FORMULARY  FOR AND ACCEPTATION CONTACT [facility] RX # [id number]    2. DOFETILIDE  125 MCG BID  COVER- YES  CO-PAY- 40 % cost will be $140.00 TIER- 4 DRUG  PRIOR APPROVAL- NO   DOFETILIDE 250 MCG BID  SAME AS ABOVE   DOFETILIDE 500 MCG BID  SAME AS ABOVE   PHARMACY : [facility]ERR[facility]  [name], RN 06/17/2016, 12:34 PM

## 2016-06-17 NOTE — Progress Notes (Signed)
SUBJECTIVE: The patient is doing well today.  At this time, she denies chest pain, shortness of breath, or any new concerns.  Didn't sleep well last night  . allopurinol  200 mg Oral QHS  . apixaban  2.5 mg Oral BID  . atorvastatin  80 mg Oral QHS  . benazepril  20 mg Oral Daily  . diltiazem  240 mg Oral Daily  . dofetilide  125 mcg Oral BID  . furosemide  40 mg Oral Daily  . insulin aspart  0-9 Units Subcutaneous TID WC  . insulin detemir  36 Units Subcutaneous Daily  . levothyroxine  50 mcg Oral QAC breakfast  . potassium chloride SA  20 mEq Oral Daily  . potassium chloride  40 mEq Oral Once  . repaglinide  1 mg Oral BID AC  . sodium chloride flush  3 mL Intravenous Q12H     OBJECTIVE: Physical Exam: Vitals:   06/16/16 1522 06/16/16 1956 06/17/16 0500  BP: 140/88 (!) 148/84 (!) 152/99  Pulse: 85 70 93  Resp: 16 16 16   Temp: 97.9 F (36.6 C) 98.1 F (36.7 C) 97.9 F (36.6 C)  TempSrc: Oral Oral Oral  SpO2: 98% 96% 96%  Weight: 172 lb (78 kg)  171 lb (77.6 kg)  Height: 5\' 3"  (1.6 m)      Intake/Output Summary (Last 24 hours) at 06/17/16 0820 Last data filed at 06/16/16 1717  Gross per 24 hour  Intake              460 ml  Output                0 ml  Net              460 ml    Telemetry is reviewed by myself, AFib 70's-90's  GEN- The patient is well appearing, alert and oriented x 3 today.   Head- normocephalic, atraumatic Eyes-  Sclera clear, conjunctiva pink Ears- hearing intact Oropharynx- clear Neck- supple, no JVP Lungs-CTA b/l, normal work of breathing Heart- IRRR, no significant murmurs, no rubs or gallops GI- soft, NT, ND Extremities- no clubbing, cyanosis, or edema Skin- no rash or lesion Psych- euthymic mood, full affect Neuro- no gross deficits appreciated  LABS: Basic Metabolic Panel:  Recent Labs  06/16/16 0924 06/17/16 0435  NA 138 140  K 4.6 3.7  CL 104 105  CO2 24 24  GLUCOSE 281* 152*  BUN 23 22*  CREATININE 1.46* 1.48*    CALCIUM 9.1 9.1  MG 1.9 2.4    ASSESSMENT AND PLAN:   1. Persistent AFib     CHA2DS2Vasc is 5, on Eliquis      (by notes, dose confirmed with Dr. Lovena Le and [facility] to proceed./continue with 2.5mg  dose)     K+ 3.7 (additional replacement ordered)     Mag 2.4     Creat 1.48 (Cal. Cr. Clearance = 38), on 183mcg BID     EKG last night not done, this morning's EKG is reviewed with Dr. Lovena Le, QTc stable     DCCV tomorrow if still in AF, patient is aware and agreeable  2. HTN     Continue home meds  3. DM     Continue home meds  4. Bradycardia     W/PPM, last checked with Dr. Lovena Le, Dec 2017, working normally   [place], [place] 06/17/2016 8:20 AM  EP Attending  Patient seen and examined. Agree with above. The patient remains in atrial  fib. Her ventricular rate is well controlled. Her QT is ok. Will plan for DCCV tomorrow if she has not converted back to NSR.  Mikle Bosworth.D.

## 2016-06-18 ENCOUNTER — Encounter (HOSPITAL_COMMUNITY): Payer: Self-pay | Admitting: Certified Registered Nurse Anesthetist

## 2016-06-18 ENCOUNTER — Encounter (HOSPITAL_COMMUNITY)
Admission: AD | Disposition: A | Discharge status: Home / Self Care | Payer: Self-pay | Source: Home / Self Care | Attending: Internal Medicine

## 2016-06-18 DIAGNOSIS — I481 Persistent atrial fibrillation: Principal | ICD-10-CM

## 2016-06-18 LAB — BASIC METABOLIC PANEL
ANION GAP: 12 (ref 5–15)
BUN: 29 mg/dL — ABNORMAL HIGH (ref 6–20)
CALCIUM: 9 mg/dL (ref 8.9–10.3)
CO2: 21 mmol/L — AB (ref 22–32)
CREATININE: 1.59 mg/dL — AB (ref 0.44–1.00)
Chloride: 105 mmol/L (ref 101–111)
GFR, EST AFRICAN AMERICAN: 35 mL/min — AB (ref >60)
GFR, EST NON AFRICAN AMERICAN: 30 mL/min — AB (ref >60)
GLUCOSE: 182 mg/dL — AB (ref 65–99)
Potassium: 4.1 mmol/L (ref 3.5–5.1)
Sodium: 138 mmol/L (ref 135–145)

## 2016-06-18 LAB — GLUCOSE, CAPILLARY
GLUCOSE-CAPILLARY: 156 mg/dL — AB (ref 65–99)
GLUCOSE-CAPILLARY: 113 mg/dL — AB (ref 65–99)
Glucose-Capillary: 209 mg/dL — ABNORMAL HIGH (ref 65–99)
Glucose-Capillary: 125 mg/dL — ABNORMAL HIGH (ref 65–99)

## 2016-06-18 LAB — MAGNESIUM: MAGNESIUM: 2.1 mg/dL (ref 1.7–2.4)

## 2016-06-18 SURGERY — CARDIOVERSION
Anesthesia: Monitor Anesthesia Care

## 2016-06-18 NOTE — Progress Notes (Signed)
SUBJECTIVE: The patient is doing well today.  At this time, she denies chest pain, shortness of breath, or any new concerns.  Slept very well last night  . allopurinol  200 mg Oral QHS  . apixaban  2.5 mg Oral BID  . atorvastatin  80 mg Oral QHS  . benazepril  20 mg Oral Daily  . diltiazem  240 mg Oral Daily  . dofetilide  125 mcg Oral BID  . furosemide  40 mg Oral Daily  . insulin aspart  0-9 Units Subcutaneous TID WC  . insulin detemir  36 Units Subcutaneous Daily  . levothyroxine  50 mcg Oral QAC breakfast  . potassium chloride SA  20 mEq Oral Daily  . repaglinide  1 mg Oral BID AC  . sodium chloride flush  3 mL Intravenous Q12H  . sodium chloride flush  3 mL Intravenous Q12H   . sodium chloride      OBJECTIVE: Physical Exam: Vitals:   06/17/16 1300 06/17/16 2012 06/18/16 0433 06/18/16 0439  BP: (!) 142/89 (!) 147/81 128/66   Pulse: 94 89 82   Resp: 18 20 17    Temp: 98 F (36.7 C) 97.9 F (36.6 C) 98 F (36.7 C)   TempSrc: Oral Oral Oral   SpO2: 97% 96% 96%   Weight:    171 lb 4.8 oz (77.7 kg)  Height:        Intake/Output Summary (Last 24 hours) at 06/18/16 0801 Last data filed at 06/18/16 0435  Gross per 24 hour  Intake              920 ml  Output              400 ml  Net              520 ml    Telemetry is reviewed by myself, AFib 70's occasional V pacing, no V arrhythmias  GEN- The patient is well appearing, alert and oriented x 3 today.   Head- normocephalic, atraumatic Eyes-  Sclera clear, conjunctiva pink Ears- hearing intact Oropharynx- clear Neck- supple, no JVP Lungs-CTA b/l, normal work of breathing Heart- IRRR, no significant murmurs, no rubs or gallops GI- soft, NT, ND Extremities- no clubbing, cyanosis, or edema Skin- no rash or lesion Psych- euthymic mood, full affect Neuro- no gross deficits appreciated  LABS: Basic Metabolic Panel:  Recent Labs  06/17/16 0435 06/18/16 0432  NA 140 138  K 3.7 4.1  CL 105 105  CO2 24 21*    GLUCOSE 152* 182*  BUN 22* 29*  CREATININE 1.48* 1.59*  CALCIUM 9.1 9.0  MG 2.4 2.1    ASSESSMENT AND PLAN:   1. Persistent AFib     CHA2DS2Vasc is 5, on Eliquis      (by notes, dose confirmed with Dr. Lovena Le and [facility] to proceed./continue with 2.5mg  dose)     K+ 4.1     Mag 2.1     Creat 1.59 (Cal. Cr. Clearance = 35), on 198mcg BID (s/p 3 doses)     EKG is reviewed by Dr. Lovena Le, QTc stable     DCCV today, patient is aware and agreeable     Anticipate discharge tomorrow after 6th dose  2. HTN     Continue home meds  3. DM     Continue home meds  4. Bradycardia     W/PPM, last checked with Dr. Lovena Le, Dec 2017, working normally   [place], [place] 06/18/2016 8:01 AM  EP Attending  Patient seen and examined. Agree with the findings as noted above. She is doing well this morning and will undergo DCCV this morning. DC home tomorrow. Her QT appears ok. Will be able to evaluate easier when she is back in NSR.  Mikle Bosworth.D.

## 2016-06-18 NOTE — Discharge Summary (Signed)
ELECTROPHYSIOLOGY PROCEDURE DISCHARGE SUMMARY    Patient ID: Kathryn Richardson,  MRN: [id number], DOB/AGE: March 01, 1935 82 y.o.  Admit date: 06/16/2016 Discharge date: 06/19/16  Primary Care Physician: Gara Kroner, MD  Primary Cardiologist: Dr. Irish Lack Electrophysiologist: Dr. Lovena Le  Primary Discharge Diagnosis:  1.  persistent atrial fibrillation status post Tikosyn loading this admission      CHA2DS2Vasc is 5, on Eliquis (dose confirmed as ordered with Dr. Lovena Le)  Secondary Discharge Diagnosis:  1. HTN 2. DM 3. Bradycardia w/PPM  Allergies  Allergen Reactions  . Cortisone Other (See Comments)    Hyperglycemia   . Prednisone Other (See Comments)    Hyperglycemia      Procedures This Admission:  1.  Tikosyn loading   Brief HPI: Kathryn Richardson is a 82 y.o. female with a past medical history as noted above.  Treatment options of atrial fibrillation were discussed out patient.  Risks, benefits, and alternatives to Tikosyn were reviewed with the patient who wished to proceed.    Hospital Course:  The patient was admitted and Tikosyn was initiated.  Renal function and electrolytes were followed during the hospitalization.  Their QTc remained stable.  The patient converted to SR/A paced rhythm 06/18/16 and did not require DCCV.  She was monitored until discharge on telemetry which demonstrated A pace/Vsensed rhythm.  On the day of discharge, she was examined by Dr Lovena Le who considered her stable for discharge to home.  Follow-up has been arranged with the AFib clinic for EKG, labs, visit in 1 week and with Dr Lovena Le in 4 weeks.  The patient required additional potassium replacement while here, we will increase her home dose given her lasix.  Physical Exam: Vitals:   06/18/16 1500 06/18/16 2133 06/19/16 0457 06/19/16 0900  BP: (!) 138/58 (!) 131/55 (!) 135/59 139/86  Pulse: 70 61 63   Resp: 16     Temp: 97.6 F (36.4 C) 98 F (36.7 C) 98.1 F (36.7 C) 98 F (36.7 C)    TempSrc: Oral Oral Oral Oral  SpO2: 97% 96% 96%   Weight:   172 lb 6.4 oz (78.2 kg)   Height:        GEN- The patient is well appearing, alert and oriented x 3 today.   HEENT: normocephalic, atraumatic; sclera clear, conjunctiva pink; hearing intact; oropharynx clear; neck supple, no JVP Lymph- no cervical lymphadenopathy Lungs- CTA b/l, normal work of breathing.  No wheezes, rales, rhonchi Heart- RRR, no murmurs, rubs or gallops, PMI not laterally displaced GI- soft, non-tender, non-distended Extremities- no clubbing, cyanosis, or edema MS- no significant deformity or atrophy Skin- warm and dry, no rash or lesion Psych- euthymic mood, full affect Neuro- strength and sensation are intact   Labs:   Lab Results  Component Value Date   WBC 12.0 (H) 04/09/2016   HGB 15.6 (H) 04/09/2016   HCT 45.6 04/09/2016   MCV 95.6 04/09/2016   PLT 250 04/09/2016     Recent Labs Lab 06/19/16 0326  NA 137  K 3.8  CL 105  CO2 22  BUN 32*  CREATININE 1.79*  CALCIUM 8.9  GLUCOSE 169*     Discharge Medications:  Allergies as of 06/19/2016      Reactions   Cortisone Other (See Comments)   Hyperglycemia    Prednisone Other (See Comments)   Hyperglycemia       Medication List    TAKE these medications   allopurinol 100 MG tablet Commonly known as:  ZYLOPRIM Take 200 mg by mouth at bedtime.   atorvastatin 80 MG tablet Commonly known as:  LIPITOR Take 80 mg by mouth at bedtime.   benazepril 20 MG tablet Commonly known as:  LOTENSIN Take 20 mg by mouth daily.   diltiazem 240 MG 24 hr capsule Commonly known as:  CARDIZEM CD TAKE 1 CAPSULE BY MOUTH DAILY   diltiazem 30 MG tablet Commonly known as:  CARDIZEM Take 1 tablet every 4 hours AS NEEDED for afib heart rate 90 or greater.   dofetilide 125 MCG capsule Commonly known as:  TIKOSYN Take 1 capsule (125 mcg total) by mouth 2 (two) times daily.   ELIQUIS 2.5 MG Tabs tablet Generic drug:  apixaban TAKE 1 TABLET BY  MOUTH TWICE DAILY   furosemide 40 MG tablet Commonly known as:  LASIX Take 40 mg by mouth daily.   LEVEMIR [place] Inject into the skin. 36 UNITS IN THE MORNING   levothyroxine 50 MCG tablet Commonly known as:  SYNTHROID, LEVOTHROID Take 50 mcg by mouth daily. On a empty stomach.   potassium chloride SA 20 MEQ tablet Commonly known as:  KLOR-CON M20 Take 1.5 tablets (30 mEq total) by mouth daily. What changed:  how much to take   repaglinide 1 MG tablet Commonly known as:  PRANDIN Take 1 mg by mouth 2 (two) times daily before a meal.       Disposition: Home Discharge Instructions    Diet - low sodium heart healthy    Complete by:  As directed    Increase activity slowly    Complete by:  As directed      Follow-up Information    [facility] Follow up on 06/26/2016.   Specialty:  Cardiology Why:  1:30PM Contact information: [address] [id number] mc [place]       Cristopher Peru, MD Follow up on 07/15/2016.   Specialty:  Cardiology Why:  12:00PM (noon) Contact information: [address] [address] [phone]           Duration of Discharge Encounter: Greater than 30 minutes including physician time.  Venetia Night, PA-C 06/19/2016 12:24 PM  EP Attending  Patient seen and examined. Agree with above. Her QT looks good and she is maintaining NSR. She will followup in a week for labs and an ECG.  Mikle Bosworth.D.

## 2016-06-19 LAB — BASIC METABOLIC PANEL
ANION GAP: 10 (ref 5–15)
BUN: 32 mg/dL — ABNORMAL HIGH (ref 6–20)
CHLORIDE: 105 mmol/L (ref 101–111)
CO2: 22 mmol/L (ref 22–32)
Calcium: 8.9 mg/dL (ref 8.9–10.3)
Creatinine, Ser: 1.79 mg/dL — ABNORMAL HIGH (ref 0.44–1.00)
GFR, EST AFRICAN AMERICAN: 30 mL/min — AB (ref >60)
GFR, EST NON AFRICAN AMERICAN: 26 mL/min — AB (ref >60)
Glucose, Bld: 169 mg/dL — ABNORMAL HIGH (ref 65–99)
POTASSIUM: 3.8 mmol/L (ref 3.5–5.1)
Sodium: 137 mmol/L (ref 135–145)

## 2016-06-19 LAB — GLUCOSE, CAPILLARY
GLUCOSE-CAPILLARY: 154 mg/dL — AB (ref 65–99)
Glucose-Capillary: 201 mg/dL — ABNORMAL HIGH (ref 65–99)

## 2016-06-19 LAB — MAGNESIUM: MAGNESIUM: 2.1 mg/dL (ref 1.7–2.4)

## 2016-06-19 MED ORDER — DOFETILIDE 125 MCG PO CAPS: 125.0000 ug | ORAL_CAPSULE | Freq: Two times a day (BID) | ORAL | 3 refills | Status: DC

## 2016-06-19 MED ORDER — POTASSIUM CHLORIDE CRYS ER 20 MEQ PO TBCR
30.0000 meq | EXTENDED_RELEASE_TABLET | Freq: Once | ORAL | Status: AC
  Administered 2016-06-19: 30 meq via ORAL
  Filled 2016-06-19: qty 1

## 2016-06-19 MED ORDER — POTASSIUM CHLORIDE CRYS ER 20 MEQ PO TBCR: 30.0000 meq | EXTENDED_RELEASE_TABLET | Freq: Every day | ORAL | 3 refills | Status: DC

## 2016-06-19 NOTE — Consult Note (Signed)
[facility] CM Primary Care Navigator  06/19/2016  KALISE FICKETT 04-05-1935 [id number]  Met with patient and husband Fritz Pickerel) at the bedside to identify possible discharge needs. Patient reports that "heart is beating irregularly" which led to this admission.  Patient endorses Dr. [name] with [facility] as the primary care provider.    Patient shared using [facility] [employer] to obtain medications without any problem.   Patient manages her own medications at home using "pill box" system weekly.   Husband provides transportation to her doctors' appointments.  Patient's husband is the primary caregiver at home as stated.   Discharge plan is home with husband's assist per patient.  Patient voiced understanding to call primary care provider's office for a post discharge follow-up appointment within a week or sooner if needed. Patient letter was provided as a reminder. Patient mentioned that she has a previously scheduled appointment with primary care provider on 06/27/16 and will just keep that.  Offered patient [facility] care management services for DM but patient reports being able to manage DM with endocrinologist assistance and with use of insulin (pen). She also has a scheduled appointment to see dietitian and attend DM classes as reported.  Provided patient with [facility] care management contact information for any future needs that may arise.  For additional questions please contact:  Edwena Felty A. Kaisyn Millea, BSN, RN-BC [facility] PRIMARY CARE Navigator Cell: [phone]

## 2016-06-26 ENCOUNTER — Encounter (HOSPITAL_COMMUNITY): Payer: Self-pay | Admitting: Nurse Practitioner

## 2016-06-26 ENCOUNTER — Ambulatory Visit (HOSPITAL_COMMUNITY)
Admit: 2016-06-26 | Discharge: 2016-06-26 | Disposition: A | Discharge status: Home / Self Care | Payer: Medicare Other | Source: Ambulatory Visit | Attending: Nurse Practitioner | Admitting: Nurse Practitioner

## 2016-06-26 VITALS — BP 146/72 | HR 68 | Ht 63.0 in | Wt 170.0 lb

## 2016-06-26 DIAGNOSIS — K219 Gastro-esophageal reflux disease without esophagitis: Secondary | ICD-10-CM | POA: Diagnosis not present

## 2016-06-26 DIAGNOSIS — Z888 Allergy status to other drugs, medicaments and biological substances status: Secondary | ICD-10-CM | POA: Diagnosis not present

## 2016-06-26 DIAGNOSIS — Z8249 Family history of ischemic heart disease and other diseases of the circulatory system: Secondary | ICD-10-CM | POA: Diagnosis not present

## 2016-06-26 DIAGNOSIS — G4700 Insomnia, unspecified: Secondary | ICD-10-CM | POA: Diagnosis not present

## 2016-06-26 DIAGNOSIS — I481 Persistent atrial fibrillation: Secondary | ICD-10-CM | POA: Diagnosis not present

## 2016-06-26 DIAGNOSIS — Z79899 Other long term (current) drug therapy: Secondary | ICD-10-CM | POA: Diagnosis not present

## 2016-06-26 DIAGNOSIS — Z794 Long term (current) use of insulin: Secondary | ICD-10-CM | POA: Insufficient documentation

## 2016-06-26 DIAGNOSIS — M109 Gout, unspecified: Secondary | ICD-10-CM | POA: Diagnosis not present

## 2016-06-26 DIAGNOSIS — I129 Hypertensive chronic kidney disease with stage 1 through stage 4 chronic kidney disease, or unspecified chronic kidney disease: Secondary | ICD-10-CM | POA: Diagnosis not present

## 2016-06-26 DIAGNOSIS — I4819 Other persistent atrial fibrillation: Secondary | ICD-10-CM

## 2016-06-26 DIAGNOSIS — J309 Allergic rhinitis, unspecified: Secondary | ICD-10-CM | POA: Diagnosis not present

## 2016-06-26 DIAGNOSIS — E1122 Type 2 diabetes mellitus with diabetic chronic kidney disease: Secondary | ICD-10-CM | POA: Diagnosis not present

## 2016-06-26 DIAGNOSIS — Z950 Presence of cardiac pacemaker: Secondary | ICD-10-CM | POA: Diagnosis not present

## 2016-06-26 DIAGNOSIS — N184 Chronic kidney disease, stage 4 (severe): Secondary | ICD-10-CM | POA: Insufficient documentation

## 2016-06-26 DIAGNOSIS — E782 Mixed hyperlipidemia: Secondary | ICD-10-CM | POA: Insufficient documentation

## 2016-06-26 LAB — BASIC METABOLIC PANEL
ANION GAP: 11 (ref 5–15)
BUN: 33 mg/dL — ABNORMAL HIGH (ref 6–20)
CHLORIDE: 106 mmol/L (ref 101–111)
CO2: 21 mmol/L — AB (ref 22–32)
Calcium: 9.5 mg/dL (ref 8.9–10.3)
Creatinine, Ser: 1.97 mg/dL — ABNORMAL HIGH (ref 0.44–1.00)
GFR calc non Af Amer: 23 mL/min — ABNORMAL LOW (ref >60)
GFR, EST AFRICAN AMERICAN: 27 mL/min — AB (ref >60)
Glucose, Bld: 182 mg/dL — ABNORMAL HIGH (ref 65–99)
POTASSIUM: 4.7 mmol/L (ref 3.5–5.1)
SODIUM: 138 mmol/L (ref 135–145)

## 2016-06-26 LAB — MAGNESIUM: MAGNESIUM: 2 mg/dL (ref 1.7–2.4)

## 2016-06-26 NOTE — Progress Notes (Signed)
Patient ID: Kathryn Richardson, female   DOB: 11/24/1934, 82 y.o.   MRN: [id number]      Primary Care Physician: Gara Kroner, MD Referring Physician: Dr Irish Lack EP: Dr. Haydee Salter is a 82 y.o. female with a h/o persistent atrial fibrillation and bradycardia  receiving a PPM 12/16 for syncope, thought maybe caused from 1:1 aflutter.  She was discharged in afib on flecainide 50 mg  and diltiazem 240 mg qd was added for rate control.  However she remained in afib. Flecainide was increased to 100 mg bid without converting pt to SR and level of flecainide was  high on this dose, so she was reduced to 75 mg bid.   Most recently, she presented for cardioversion 04/09/16, after a prednisone taper for gout,  which was successful but unfortunately went back into afib last week. An extra dose of 50 mg flecainide and extra Cardizem did not convert her. She is symptomatic with fatigue in afib. She states that she has been under stress with the bitter divorce of her grandson. No missed does of eliquis.  F/u in afib clinic past hospitalization for Tikosyn initiation. She did convert with drug and did not require DCCV. She feels slightly lightheaded with standing since start of drug. Passes as she starts to walk. Taking drug correctly. Is aware of precautions with tikosyn. Is staying in rhythm.  Today, she denies symptoms of  chest pain , orthopnea, PND, lower extremity edema, presyncope, syncope, or neurologic sequela. The patient is tolerating medications without difficulties and is otherwise without complaint today.   Past Medical History:  Diagnosis Date  . Allergic rhinitis   . Atrial fibrillation ([place])   . Chronic renal disease, stage IV ([place])   . Essential hypertension   . GERD (gastroesophageal reflux disease)   . Gout   . Hyperlipemia, mixed   . Insomnia   . Type 2 diabetes mellitus ([place])    Past Surgical History:  Procedure Laterality Date  . CARDIOVERSION N/A 05/29/2014   Procedure: CARDIOVERSION;  Surgeon: Fay Records, MD;  Location: [facility] ENDOSCOPY;  Service: Cardiovascular;  Laterality: N/A;  . CARDIOVERSION N/A 11/03/2014   Procedure: CARDIOVERSION;  Surgeon: Thayer Headings, MD;  Location: [place];  Service: Cardiovascular;  Laterality: N/A;  . CARDIOVERSION N/A 04/14/2016   Procedure: CARDIOVERSION;  Surgeon: Jerline Pain, MD;  Location: [place];  Service: Cardiovascular;  Laterality: N/A;  . CESAREAN SECTION     x2  . ESOPHAGEAL DILATION    . PERMANENT PACEMAKER INSERTION N/A 04/26/2014   Procedure: PERMANENT PACEMAKER INSERTION;  Surgeon: Evans Lance, MD;  Location[facility] C[facility] LAB;  Service: Cardiovascular;  Laterality: N/A;  . TEE WITHOUT CARDIOVERSION N/A 05/29/2014   Procedure: TRANSESOPHAGEAL ECHOCARDIOGRAM (TEE);  Surgeon: Fay Records, MD;  Location: [facility] ENDOSCOPY;  Service: Cardiovascular;  Laterality: N/A;    Current Outpatient Prescriptions  Medication Sig Dispense Refill  . allopurinol (ZYLOPRIM) 100 MG tablet Take 200 mg by mouth at bedtime.     Marland Kitchen atorvastatin (LIPITOR) 80 MG tablet Take 80 mg by mouth at bedtime.     . benazepril (LOTENSIN) 20 MG tablet Take 20 mg by mouth daily.    Marland Kitchen diltiazem (CARDIZEM CD) 240 MG 24 hr capsule TAKE 1 CAPSULE BY MOUTH DAILY 90 capsule 0  . diltiazem (CARDIZEM) 30 MG tablet Take 1 tablet every 4 hours AS NEEDED for afib heart rate 90 or greater. 45 tablet 2  . dofetilide (TIKOSYN) 125  MCG capsule Take 1 capsule (125 mcg total) by mouth 2 (two) times daily. 60 capsule 3  . ELIQUIS 2.5 MG TABS tablet TAKE 1 TABLET BY MOUTH TWICE DAILY 60 tablet 11  . furosemide (LASIX) 40 MG tablet Take 40 mg by mouth daily.    . Insulin Detemir (LEVEMIR [place]) Inject into the skin. 36 UNITS IN THE MORNING    . levothyroxine (SYNTHROID, LEVOTHROID) 50 MCG tablet Take 50 mcg by mouth daily. On a empty stomach.    . potassium chloride SA (KLOR-CON M20) 20 MEQ tablet Take 1.5 tablets (30 mEq total) by mouth daily. 45 tablet  3  . repaglinide (PRANDIN) 1 MG tablet Take 1 mg by mouth 2 (two) times daily before a meal.      No current facility-administered medications for this encounter.     Allergies  Allergen Reactions  . Cortisone Other (See Comments)    Hyperglycemia   . Prednisone Other (See Comments)    Hyperglycemia     Social History   Social History  . Marital status: Married    Spouse name: N/A  . Number of children: N/A  . Years of education: N/A   Occupational History  . Not on file.   Social History Main Topics  . Smoking status: Never Smoker  . Smokeless tobacco: Never Used  . Alcohol use No  . Drug use: No  . Sexual activity: Yes    Birth control/ protection: Other-see comments   Other Topics Concern  . Not on file   Social History Narrative   Lives in [place].  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 Coronary artery disease Father 104  . Heart attack Father   . Congestive Heart Failure Mother   . Alzheimer's disease Mother    Physical Exam: Vitals:   06/26/16 1331  BP: (!) 146/72  BP Location: Left Arm  Patient Position: Sitting  Cuff Size: Normal  Pulse: 68  Weight: 170 lb (77.1 kg)  Height: 5\' 3"  (1.6 m)    GEN- The patient is well appearing, alert and oriented x 3 today.   Head- normocephalic, atraumatic Eyes-  Sclera clear, conjunctiva pink Ears- hearing intact Oropharynx- clear Neck- supple, no JVP Lymph- no cervical lymphadenopathy Lungs- Clear to ausculation bilaterally, normal work of breathing Heart-irregular rate and rhythm, no murmurs, rubs or gallops, PMI not laterally displaced. Pacer site well healed. GI- soft, NT, ND, + BS Extremities- no clubbing, cyanosis, or edema   EKG -atrial paced rhythm  Pr int 224 ms, qrs int 74 ms, qtc 427 ms Epic records reviewed Echo- Study Conclusions  - Left ventricle: The cavity size was normal. Wall thickness was   normal. Systolic  function was normal. The estimated ejection   fraction was in the range of 50% to 55%. Wall motion was normal;   there were no regional wall motion abnormalities. Doppler   parameters are consistent with high ventricular filling pressure. - Mitral valve: Calcified annulus. There was mild regurgitation. - Left atrium: The atrium was mildly dilated. - Tricuspid valve: There was moderate regurgitation. - Pulmonary arteries: Systolic pressure was mildly increased. PA   peak pressure: 35 mm Hg (S).  Impressions:  - Normal LV systolic function; mild MR; mld LAE; moderate TR;   mildly elevated pulmonary pressure.  Assessment and Plan:  1.Symptomatic Persistent afib   Now in SR with initiation of tikosyn Continue 125 mcg bid  Precautions with drug discussed Bmet/mag today  2. PPM  Per Dr. Lovena Le  3. HTN  Stable   F/u with Dr. Lovena Le 3/6 afib clinic as needed  Geroge Baseman. [place], [facility] [address] [place], [place] [address] [phone]

## 2016-06-27 DIAGNOSIS — E559 Vitamin D deficiency, unspecified: Secondary | ICD-10-CM | POA: Diagnosis not present

## 2016-06-27 DIAGNOSIS — M25572 Pain in left ankle and joints of left foot: Secondary | ICD-10-CM | POA: Diagnosis not present

## 2016-06-27 DIAGNOSIS — Z794 Long term (current) use of insulin: Secondary | ICD-10-CM | POA: Diagnosis not present

## 2016-06-27 DIAGNOSIS — I4891 Unspecified atrial fibrillation: Secondary | ICD-10-CM | POA: Diagnosis not present

## 2016-06-27 DIAGNOSIS — M109 Gout, unspecified: Secondary | ICD-10-CM | POA: Diagnosis not present

## 2016-06-27 DIAGNOSIS — E1122 Type 2 diabetes mellitus with diabetic chronic kidney disease: Secondary | ICD-10-CM | POA: Diagnosis not present

## 2016-06-27 DIAGNOSIS — R609 Edema, unspecified: Secondary | ICD-10-CM | POA: Diagnosis not present

## 2016-06-27 DIAGNOSIS — E039 Hypothyroidism, unspecified: Secondary | ICD-10-CM | POA: Diagnosis not present

## 2016-06-27 DIAGNOSIS — N184 Chronic kidney disease, stage 4 (severe): Secondary | ICD-10-CM | POA: Diagnosis not present

## 2016-06-27 DIAGNOSIS — E7800 Pure hypercholesterolemia, unspecified: Secondary | ICD-10-CM | POA: Diagnosis not present

## 2016-07-07 ENCOUNTER — Encounter: Payer: Self-pay | Admitting: Dietician

## 2016-07-07 ENCOUNTER — Encounter: Payer: Medicare Other | Attending: Internal Medicine | Admitting: Dietician

## 2016-07-07 DIAGNOSIS — IMO0002 Reserved for concepts with insufficient information to code with codable children: Secondary | ICD-10-CM

## 2016-07-07 DIAGNOSIS — Z713 Dietary counseling and surveillance: Secondary | ICD-10-CM | POA: Diagnosis not present

## 2016-07-07 DIAGNOSIS — M109 Gout, unspecified: Secondary | ICD-10-CM | POA: Insufficient documentation

## 2016-07-07 DIAGNOSIS — E1122 Type 2 diabetes mellitus with diabetic chronic kidney disease: Secondary | ICD-10-CM | POA: Diagnosis not present

## 2016-07-07 DIAGNOSIS — E118 Type 2 diabetes mellitus with unspecified complications: Secondary | ICD-10-CM

## 2016-07-07 DIAGNOSIS — N184 Chronic kidney disease, stage 4 (severe): Secondary | ICD-10-CM | POA: Diagnosis not present

## 2016-07-07 DIAGNOSIS — N189 Chronic kidney disease, unspecified: Secondary | ICD-10-CM | POA: Insufficient documentation

## 2016-07-07 DIAGNOSIS — Z6830 Body mass index (BMI) 30.0-30.9, adult: Secondary | ICD-10-CM | POA: Diagnosis not present

## 2016-07-07 DIAGNOSIS — E1165 Type 2 diabetes mellitus with hyperglycemia: Secondary | ICD-10-CM | POA: Insufficient documentation

## 2016-07-07 DIAGNOSIS — N183 Chronic kidney disease, stage 3 unspecified: Secondary | ICD-10-CM

## 2016-07-07 DIAGNOSIS — Z794 Long term (current) use of insulin: Secondary | ICD-10-CM

## 2016-07-07 NOTE — Patient Instructions (Addendum)
Let your kidney doctor know that you are using "No Salt" to season at times OR don't use this product. Ask your kidney doctor about your phosphorous and do you need to restrict this. Speak to your doctor about your dizziness.  Consider armchair exercise or swimming. Be consistent with meal times.  Avoid meal delays to avoid a low blood sugar. Consider an afternoon snack. Be consistent with carbohydrate intake.  Aim for about 30 grams carbohydrate per meal. Continue to follow a low sodium diet. What can you do for stress or emotional eating rather than eat?

## 2016-07-07 NOTE — Progress Notes (Signed)
Medical Nutrition Therapy:  Appt start time: 0950 end time:  1100.   Assessment:  Primary concerns today: Patient is here today with her husband for nutrition information regarding CKD, gout, and type 2 diabetes.  Other hx includes hyperlipidemia, HTN, and GERD.  She has vitamin D deficiency but states that she was told not to supplement by her nephrologist.  She has complained of decreased balance and increased dizziness and therefore does not feel that she should walk or go dancing.  She has considered swimming.  She follows a low sodium diet and counts carbohydrates although reports emotional eating (sweets and chips).  She is using "No Salt" at times.  Labs include:  A1C of 8.2% 03/05/17, Uric Acid 6.8 on 04/29/16 despite allopurinol, BUN 33, creatine 1.97, Potassium, and GFR 23 on 06/26/16.    Weight hx:  176 lbs today increased from 156 lbs over the past year due to "stress eating" and other causes.  Patient lives with her husband.  They shop together and she does most of the cooking.  She is a retired Web designer.  Stress includes a son with a terminal illness (antiphospholipid syndrome).  He lives in [place] but has been at [facility] for a month in the past.  They drive back and forth to see him.  She is also frustrated about her own health and feels burned out and will eat what ever she wants during these times.   when meals are delayed and her husband tries to help keep her on a schedule.  Preferred Learning Style:   No preference indicated   Learning Readiness:   Ready   MEDICATIONS: see list to include Levemir 48 units q am, prandin 15-30 minutes before breakfast and dinner, lasix, KCL, synthroid.   DIETARY INTAKE:  Usual eating pattern includes 3 meals and 0-2 snacks per day. Avoided foods include added salt and high sodium foods.   Tries to stick to 30 grams carbohydrates per meal and 15 per snack except when stress eating. They have found recipes and other  information regarding CKD on the [employer] web site.  24-hr recall:  B ( AM): egg and toast or [employer] english muffin and PB OR quick oats with cinnamon, walnuts, 1/2 banana  Snk ( AM): none  L ( PM): 1/2 chicken salad on [employer] OR 1/2 LS [place] on [place] roll OR 1/2 LS shaved beef on [place] roll or tuna salad on [employer] bread or homemade soup Snk ( PM): occasional NABS or a couple of slices apple D ( PM): pasta with alfredo sauce or red sauce or stir fry over noodles OR baked potato or box mix dressing with pork with canned apples (from [employer] website) Snk ( PM): occasional whole grain breakfast cookie and small dish of ice cream OR 1-2 times per week cereal and milk Beverages: Water, decaf tea, diet twist  Usual physical activity: ADL's due to balance issues and knee problems.  Used to enjoy dancing. Has thought about swimming.    Progress Towards Goal(s):  In progress.   Nutritional Diagnosis:  NB-1.1 Food and nutrition-related knowledge deficit As related to combination of type 2 diabetes, gout, and CKD.  As evidenced by patient report.    Intervention:  Nutrition counseling/education related to diabetes, gout and CKD.  Discussed phosphorous and potassium, food sources, and to avoid restricting until lab work or MD indicates to restrict them.  Discussed sources and portion sizes of carbohydrates. Discussed protein and foods high in purines.  Let  your kidney doctor know that you are using "No Salt" to season at times OR don't use this product. Ask your kidney doctor about your phosphorous and do you need to restrict this. Speak to your doctor about your dizziness.  Consider armchair exercise or swimming. Be consistent with meal times.  Avoid meal delays to avoid a low blood sugar. Consider an afternoon snack. Be consistent with carbohydrate intake.  Aim for about 30 grams carbohydrate per meal. Continue to follow a low sodium diet. What can you do for stress or emotional eating rather than  eat?  Teaching Method Utilized:  Visual Auditory Hands on  Handouts given during visit include:  Meal plan card  Food Pyramid for Healthy Eating with Kidney Disease  National Kid[employer] Foundation Low protein recipes  Barriers to learning/adherence to lifestyle change: stress, burnout  Demonstrated degree of understanding via:  Teach Back   Monitoring/Evaluation:  Dietary intake, exercise, and body weight prn.

## 2016-07-08 ENCOUNTER — Ambulatory Visit (INDEPENDENT_AMBULATORY_CARE_PROVIDER_SITE_OTHER): Payer: Medicare Other | Admitting: Internal Medicine

## 2016-07-08 ENCOUNTER — Encounter: Payer: Self-pay | Admitting: Internal Medicine

## 2016-07-08 VITALS — BP 130/74 | HR 73 | Ht 63.0 in | Wt 172.0 lb

## 2016-07-08 DIAGNOSIS — Z9889 Other specified postprocedural states: Secondary | ICD-10-CM

## 2016-07-08 DIAGNOSIS — I480 Paroxysmal atrial fibrillation: Secondary | ICD-10-CM | POA: Diagnosis not present

## 2016-07-08 DIAGNOSIS — Z8719 Personal history of other diseases of the digestive system: Secondary | ICD-10-CM | POA: Insufficient documentation

## 2016-07-08 LAB — CUP PACEART INCLINIC DEVICE CHECK
Brady Statistic AP VP Percent: 0.21 %
Brady Statistic AS VP Percent: 5.76 %
Brady Statistic RA Percent Paced: 75.93 %
Brady Statistic RV Percent Paced: 5.82 %
Implantable Lead Implant Date: 20151217
Implantable Lead Location: 753859
Implantable Lead Model: 5076
Implantable Lead Model: 5076
Implantable Pulse Generator Implant Date: 20151217
Lead Channel Impedance Value: 361 Ohm
Lead Channel Impedance Value: 456 Ohm
Lead Channel Impedance Value: 456 Ohm
Lead Channel Impedance Value: 494 Ohm
Lead Channel Pacing Threshold Amplitude: 0.75 V
Lead Channel Pacing Threshold Pulse Width: 0.4 ms
Lead Channel Pacing Threshold Pulse Width: 0.4 ms
Lead Channel Sensing Intrinsic Amplitude: 9.75 mV
Lead Channel Setting Pacing Amplitude: 2 V
Lead Channel Setting Pacing Amplitude: 2.5 V
Lead Channel Setting Pacing Pulse Width: 0.4 ms
Lead Channel Setting Sensing Sensitivity: 0.9 mV
MDC IDC LEAD IMPLANT DT: 20151217
MDC IDC LEAD LOCATION: 753860
MDC IDC MSMT BATTERY REMAINING LONGEVITY: 93 mo
MDC IDC MSMT BATTERY VOLTAGE: 3.02 V
MDC IDC MSMT LEADCHNL RA PACING THRESHOLD AMPLITUDE: 0.875 V
MDC IDC MSMT LEADCHNL RA SENSING INTR AMPL: 1.25 mV
MDC IDC MSMT LEADCHNL RA SENSING INTR AMPL: 1.25 mV
MDC IDC MSMT LEADCHNL RV SENSING INTR AMPL: 9.75 mV
MDC IDC SESS DTM: 20180227133917
MDC IDC STAT BRADY AP VS PERCENT: 80.21 %
MDC IDC STAT BRADY AS VS PERCENT: 13.81 %

## 2016-07-08 NOTE — Progress Notes (Signed)
HPI Kathryn Richardson returns today, for ongoing followup. She has PAF and failed Flecainide and was placed on Dofetilide a few weeks ago. She has done well with this and denies chest pain, sob, or palpitations. No edema. No other complaints.  Allergies  Allergen Reactions  . Cortisone Other (See Comments)    Hyperglycemia   . Prednisone Other (See Comments)    Hyperglycemia      Current Outpatient Prescriptions  Medication Sig Dispense Refill  . allopurinol (ZYLOPRIM) 100 MG tablet Take 200 mg by mouth at bedtime.     Marland Kitchen atorvastatin (LIPITOR) 80 MG tablet Take 80 mg by mouth at bedtime.     . benazepril (LOTENSIN) 20 MG tablet Take 20 mg by mouth daily.    Marland Kitchen diltiazem (CARDIZEM CD) 240 MG 24 hr capsule TAKE 1 CAPSULE BY MOUTH DAILY 90 capsule 0  . diltiazem (CARDIZEM) 30 MG tablet Take 1 tablet every 4 hours AS NEEDED for afib heart rate 90 or greater. 45 tablet 2  . dofetilide (TIKOSYN) 125 MCG capsule Take 1 capsule (125 mcg total) by mouth 2 (two) times daily. 60 capsule 3  . ELIQUIS 2.5 MG TABS tablet TAKE 1 TABLET BY MOUTH TWICE DAILY 60 tablet 11  . furosemide (LASIX) 40 MG tablet Take 40 mg by mouth daily.    Marland Kitchen LEVEMIR FLEXTOUCH 100 UNIT/ML Pen Take 40 Units by mouth every morning.    Marland Kitchen levothyroxine (SYNTHROID, LEVOTHROID) 50 MCG tablet Take 50 mcg by mouth daily. On a empty stomach.    . potassium chloride SA (KLOR-CON M20) 20 MEQ tablet Take 1.5 tablets (30 mEq total) by mouth daily. 45 tablet 3  . repaglinide (PRANDIN) 2 MG tablet Take 1 tablet twice daily 15 to 30 minutes before meals  10   No current facility-administered medications for this visit.      Past Medical History:  Diagnosis Date  . Allergic rhinitis   . Atrial fibrillation ([place])   . Chronic renal disease, stage IV ([place])   . Essential hypertension   . GERD (gastroesophageal reflux disease)   . Gout   . Hyperlipemia, mixed   . Insomnia   . Type 2 diabetes mellitus ([facility])     ROS:   All systems  reviewed and negative except as noted in the HPI.   Past Surgical History:  Procedure Laterality Date  . CARDIOVERSION N/A 05/29/2014   Procedure: CARDIOVERSION;  Surgeon: Fay Records, MD;  Location: [facility] ENDOSCOPY;  Service: Cardiovascular;  Laterality: N/A;  . CARDIOVERSION N/A 11/03/2014   Procedure: CARDIOVERSION;  Surgeon: Thayer Headings, MD;  Location: [place];  Service: Cardiovascular;  Laterality: N/A;  . CARDIOVERSION N/A 04/14/2016   Procedure: CARDIOVERSION;  Surgeon: Jerline Pain, MD;  Location: [place];  Service: Cardiovascular;  Laterality: N/A;  . CESAREAN SECTION     x2  . ESOPHAGEAL DILATION    . PERMANENT PACEMAKER INSERTION N/A 04/26/2014   Procedure: PERMANENT PACEMAKER INSERTION;  Surgeon: Evans Lance, MD;  Location: [facility] CATH LAB;  Service: Cardiovascular;  Laterality: N/A;  . TEE WITHOUT CARDIOVERSION N/A 05/29/2014   Procedure: TRANSESOPHAGEAL ECHOCARDIOGRAM (TEE);  Surgeon: Fay Records, MD;  Location: [facility] ENDOSCOPY;  Service: Cardiovascular;  Laterality: N/A;     Family History  Problem Relation Age of Onset  . Coronary artery disease Father 70  . Heart attack Father   . Congestive Heart Failure Mother   . Alzheimer's disease Mother  Social History   Social History  . Marital status: Married    Spouse name: N/A  . Number of children: N/A  . Years of education: N/A   Occupational History  . Not on file.   Social History Main Topics  . Smoking status: Never Smoker  . Smokeless tobacco: Never Used  . Alcohol use No  . Drug use: No  . Sexual activity: Yes    Birth control/ protection: Other-see comments   Other Topics Concern  . Not on file   Social History Narrative   Lives in [place].  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     BP 130/74   Pulse 73   Ht 5\' 3"  (1.6 m)   Wt 172 lb (78 kg)   SpO2 99%   BMI 30.47 kg/m   Physical Exam:  Well appearing 82yo woman, NAD HEENT: Unremarkable Neck:  6 cm  JVD, no thyromegally Lymphatics:  No adenopathy Back:  No CVA tenderness Lungs:  Clear with no wheezes, rales, or rhonchi HEART:  Regular rate rhythm, no murmurs, no rubs, no clicks Abd:  soft, positive bowel sounds, no organomegally, no rebound, no guarding Ext:  2 plus pulses, no edema, no cyanosis, no clubbing Skin:  No rashes no nodules Neuro:  CN II through XII intact, motor grossly intact   DEVICE  Normal device function.  See PaceArt for details.   Assess/Plan: 1. Sinus node dysfunction - she is asymptomatic s/p PPM insertion 2. PAF - she was switched to Dofetilide and is doing well. She will continue.  3. PPM - her Medtronic device is working normally. Will recheck in several months. No atrial fib since starting dofetlide 4. HTN - She will maintain a low sodium diet.   Mikle Bosworth.D.

## 2016-07-08 NOTE — Patient Instructions (Signed)
Medication Instructions:   Your physician recommends that you continue on your current medications as directed. Please refer to the Current Medication list given to you today.   If you need a refill on your cardiac medications before your next appointment, please call your pharmacy.  Labwork: NONE ORDERED  TODAY    Testing/Procedures: NONE ORDERED  TODAY    Follow-Up: Your physician wants you to follow-up in:  [place]  [place] will receive a reminder letter in the mail two months in advance. If you don't receive a letter, please call our office to schedule the follow-up appointment.      Any Other Special Instructions Will Be Listed Below (If Applicable).

## 2016-07-15 ENCOUNTER — Ambulatory Visit: Payer: Medicare Other | Admitting: Internal Medicine

## 2016-07-16 DIAGNOSIS — E1122 Type 2 diabetes mellitus with diabetic chronic kidney disease: Secondary | ICD-10-CM | POA: Diagnosis not present

## 2016-07-16 DIAGNOSIS — Z79899 Other long term (current) drug therapy: Secondary | ICD-10-CM | POA: Diagnosis not present

## 2016-07-16 DIAGNOSIS — D631 Anemia in chronic kidney disease: Secondary | ICD-10-CM | POA: Diagnosis not present

## 2016-07-16 DIAGNOSIS — Z7901 Long term (current) use of anticoagulants: Secondary | ICD-10-CM | POA: Diagnosis not present

## 2016-07-16 DIAGNOSIS — Z992 Dependence on renal dialysis: Secondary | ICD-10-CM | POA: Diagnosis not present

## 2016-07-16 DIAGNOSIS — I129 Hypertensive chronic kidney disease with stage 1 through stage 4 chronic kidney disease, or unspecified chronic kidney disease: Secondary | ICD-10-CM | POA: Diagnosis not present

## 2016-07-16 DIAGNOSIS — N183 Chronic kidney disease, stage 3 (moderate): Secondary | ICD-10-CM | POA: Diagnosis not present

## 2016-07-16 DIAGNOSIS — Z794 Long term (current) use of insulin: Secondary | ICD-10-CM | POA: Diagnosis not present

## 2016-07-16 DIAGNOSIS — G4700 Insomnia, unspecified: Secondary | ICD-10-CM | POA: Diagnosis not present

## 2016-07-16 DIAGNOSIS — R809 Proteinuria, unspecified: Secondary | ICD-10-CM | POA: Diagnosis not present

## 2016-07-22 DIAGNOSIS — N183 Chronic kidney disease, stage 3 (moderate): Secondary | ICD-10-CM | POA: Diagnosis not present

## 2016-08-07 ENCOUNTER — Telehealth: Payer: Self-pay | Admitting: Cardiology

## 2016-08-07 ENCOUNTER — Encounter: Payer: Medicare Other | Admitting: *Deleted

## 2016-08-07 NOTE — Telephone Encounter (Signed)
Spoke with pt and reminded pt of remote transmission that is due today. Pt verbalized understanding.   

## 2016-08-08 ENCOUNTER — Encounter: Payer: Self-pay | Admitting: Cardiology

## 2016-08-11 ENCOUNTER — Ambulatory Visit (INDEPENDENT_AMBULATORY_CARE_PROVIDER_SITE_OTHER): Payer: Medicare Other | Admitting: *Deleted

## 2016-08-11 ENCOUNTER — Other Ambulatory Visit: Payer: Self-pay | Admitting: Internal Medicine

## 2016-08-11 DIAGNOSIS — I480 Paroxysmal atrial fibrillation: Secondary | ICD-10-CM

## 2016-08-11 DIAGNOSIS — C44319 Basal cell carcinoma of skin of other parts of face: Secondary | ICD-10-CM | POA: Diagnosis not present

## 2016-08-14 LAB — CUP PACEART REMOTE DEVICE CHECK
Brady Statistic AP VP Percent: 0.11 %
Brady Statistic AP VS Percent: 98.55 %
Brady Statistic AS VP Percent: 0 %
Brady Statistic AS VS Percent: 1.35 %
Brady Statistic RV Percent Paced: 0.12 %
Implantable Lead Implant Date: 20151217
Implantable Lead Location: 753859
Implantable Lead Model: 5076
Lead Channel Impedance Value: 361 Ohm
Lead Channel Impedance Value: 475 Ohm
Lead Channel Impedance Value: 513 Ohm
Lead Channel Pacing Threshold Amplitude: 0.75 V
Lead Channel Pacing Threshold Amplitude: 0.75 V
Lead Channel Pacing Threshold Pulse Width: 0.4 ms
Lead Channel Sensing Intrinsic Amplitude: 9.875 mV
Lead Channel Sensing Intrinsic Amplitude: 9.875 mV
Lead Channel Setting Pacing Amplitude: 2 V
Lead Channel Setting Pacing Amplitude: 2.5 V
Lead Channel Setting Pacing Pulse Width: 0.4 ms
MDC IDC LEAD IMPLANT DT: 20151217
MDC IDC LEAD LOCATION: 753860
MDC IDC MSMT BATTERY REMAINING LONGEVITY: 97 mo
MDC IDC MSMT BATTERY VOLTAGE: 3.01 V
MDC IDC MSMT LEADCHNL RA PACING THRESHOLD PULSEWIDTH: 0.4 ms
MDC IDC MSMT LEADCHNL RA SENSING INTR AMPL: 1.25 mV
MDC IDC MSMT LEADCHNL RA SENSING INTR AMPL: 1.25 mV
MDC IDC MSMT LEADCHNL RV IMPEDANCE VALUE: 513 Ohm
MDC IDC PG IMPLANT DT: 20151217
MDC IDC SESS DTM: 20180402214247
MDC IDC SET LEADCHNL RV SENSING SENSITIVITY: 0.9 mV
MDC IDC STAT BRADY RA PERCENT PACED: 98.06 %

## 2016-08-14 NOTE — Progress Notes (Signed)
Remote pacemaker transmission.   

## 2016-08-25 DIAGNOSIS — E119 Type 2 diabetes mellitus without complications: Secondary | ICD-10-CM | POA: Diagnosis not present

## 2016-10-13 ENCOUNTER — Other Ambulatory Visit: Payer: Self-pay | Admitting: Physician Assistant

## 2016-10-20 ENCOUNTER — Other Ambulatory Visit: Payer: Self-pay | Admitting: Physician Assistant

## 2016-11-13 ENCOUNTER — Ambulatory Visit (INDEPENDENT_AMBULATORY_CARE_PROVIDER_SITE_OTHER): Payer: Medicare Other | Admitting: *Deleted

## 2016-11-13 ENCOUNTER — Telehealth: Payer: Self-pay | Admitting: Cardiology

## 2016-11-13 DIAGNOSIS — I4891 Unspecified atrial fibrillation: Secondary | ICD-10-CM

## 2016-11-13 NOTE — Telephone Encounter (Signed)
LMOVM reminding pt to send remote transmission.   

## 2016-11-14 ENCOUNTER — Encounter: Payer: Self-pay | Admitting: Cardiology

## 2016-11-14 DIAGNOSIS — J069 Acute upper respiratory infection, unspecified: Secondary | ICD-10-CM | POA: Diagnosis not present

## 2016-11-20 NOTE — Progress Notes (Signed)
Remote pacemaker transmission.   

## 2016-11-21 ENCOUNTER — Encounter: Payer: Self-pay | Admitting: Cardiology

## 2016-12-09 LAB — CUP PACEART REMOTE DEVICE CHECK
Brady Statistic AP VS Percent: 96.71 %
Brady Statistic AS VP Percent: 0.27 %
Brady Statistic RV Percent Paced: 0.58 %
Date Time Interrogation Session: 20180706201905
Implantable Lead Implant Date: 20151217
Implantable Lead Implant Date: 20151217
Implantable Lead Location: 753859
Implantable Lead Model: 5076
Lead Channel Impedance Value: 475 Ohm
Lead Channel Impedance Value: 494 Ohm
Lead Channel Pacing Threshold Amplitude: 0.625 V
Lead Channel Pacing Threshold Pulse Width: 0.4 ms
Lead Channel Sensing Intrinsic Amplitude: 1 mV
Lead Channel Sensing Intrinsic Amplitude: 9.5 mV
Lead Channel Sensing Intrinsic Amplitude: 9.5 mV
Lead Channel Setting Pacing Amplitude: 2.5 V
Lead Channel Setting Pacing Pulse Width: 0.4 ms
Lead Channel Setting Sensing Sensitivity: 0.9 mV
MDC IDC LEAD LOCATION: 753860
MDC IDC MSMT BATTERY REMAINING LONGEVITY: 87 mo
MDC IDC MSMT BATTERY VOLTAGE: 3.01 V
MDC IDC MSMT LEADCHNL RA IMPEDANCE VALUE: 361 Ohm
MDC IDC MSMT LEADCHNL RA PACING THRESHOLD AMPLITUDE: 0.75 V
MDC IDC MSMT LEADCHNL RA PACING THRESHOLD PULSEWIDTH: 0.4 ms
MDC IDC MSMT LEADCHNL RA SENSING INTR AMPL: 1 mV
MDC IDC MSMT LEADCHNL RV IMPEDANCE VALUE: 437 Ohm
MDC IDC PG IMPLANT DT: 20151217
MDC IDC SET LEADCHNL RA PACING AMPLITUDE: 2 V
MDC IDC STAT BRADY AP VP PERCENT: 0.31 %
MDC IDC STAT BRADY AS VS PERCENT: 2.71 %
MDC IDC STAT BRADY RA PERCENT PACED: 96.01 %

## 2017-01-13 DIAGNOSIS — D2272 Melanocytic nevi of left lower limb, including hip: Secondary | ICD-10-CM | POA: Diagnosis not present

## 2017-01-13 DIAGNOSIS — L570 Actinic keratosis: Secondary | ICD-10-CM | POA: Diagnosis not present

## 2017-01-13 DIAGNOSIS — D2261 Melanocytic nevi of right upper limb, including shoulder: Secondary | ICD-10-CM | POA: Diagnosis not present

## 2017-01-13 DIAGNOSIS — Z23 Encounter for immunization: Secondary | ICD-10-CM | POA: Diagnosis not present

## 2017-01-13 DIAGNOSIS — D1800 Hemangioma unspecified site: Secondary | ICD-10-CM | POA: Diagnosis not present

## 2017-01-13 DIAGNOSIS — Z808 Family history of malignant neoplasm of other organs or systems: Secondary | ICD-10-CM | POA: Diagnosis not present

## 2017-01-13 DIAGNOSIS — Z85828 Personal history of other malignant neoplasm of skin: Secondary | ICD-10-CM | POA: Diagnosis not present

## 2017-01-21 ENCOUNTER — Other Ambulatory Visit: Payer: Self-pay | Admitting: Internal Medicine

## 2017-01-21 DIAGNOSIS — M109 Gout, unspecified: Secondary | ICD-10-CM | POA: Diagnosis not present

## 2017-01-21 DIAGNOSIS — R195 Other fecal abnormalities: Secondary | ICD-10-CM | POA: Diagnosis not present

## 2017-01-21 DIAGNOSIS — E1122 Type 2 diabetes mellitus with diabetic chronic kidney disease: Secondary | ICD-10-CM | POA: Diagnosis not present

## 2017-01-21 DIAGNOSIS — N184 Chronic kidney disease, stage 4 (severe): Secondary | ICD-10-CM | POA: Diagnosis not present

## 2017-01-21 DIAGNOSIS — R1010 Upper abdominal pain, unspecified: Secondary | ICD-10-CM | POA: Diagnosis not present

## 2017-01-21 DIAGNOSIS — Z794 Long term (current) use of insulin: Secondary | ICD-10-CM | POA: Diagnosis not present

## 2017-01-26 DIAGNOSIS — D631 Anemia in chronic kidney disease: Secondary | ICD-10-CM | POA: Diagnosis not present

## 2017-01-26 DIAGNOSIS — I4891 Unspecified atrial fibrillation: Secondary | ICD-10-CM | POA: Diagnosis not present

## 2017-01-26 DIAGNOSIS — Z79899 Other long term (current) drug therapy: Secondary | ICD-10-CM | POA: Diagnosis not present

## 2017-01-26 DIAGNOSIS — Z794 Long term (current) use of insulin: Secondary | ICD-10-CM | POA: Diagnosis not present

## 2017-01-26 DIAGNOSIS — Z23 Encounter for immunization: Secondary | ICD-10-CM | POA: Diagnosis not present

## 2017-01-26 DIAGNOSIS — E039 Hypothyroidism, unspecified: Secondary | ICD-10-CM | POA: Diagnosis not present

## 2017-01-26 DIAGNOSIS — R809 Proteinuria, unspecified: Secondary | ICD-10-CM | POA: Diagnosis not present

## 2017-01-26 DIAGNOSIS — R300 Dysuria: Secondary | ICD-10-CM | POA: Diagnosis not present

## 2017-01-26 DIAGNOSIS — E785 Hyperlipidemia, unspecified: Secondary | ICD-10-CM | POA: Diagnosis not present

## 2017-01-26 DIAGNOSIS — N183 Chronic kidney disease, stage 3 (moderate): Secondary | ICD-10-CM | POA: Diagnosis not present

## 2017-01-26 DIAGNOSIS — G4700 Insomnia, unspecified: Secondary | ICD-10-CM | POA: Diagnosis not present

## 2017-01-26 DIAGNOSIS — Z7901 Long term (current) use of anticoagulants: Secondary | ICD-10-CM | POA: Diagnosis not present

## 2017-01-26 DIAGNOSIS — E1122 Type 2 diabetes mellitus with diabetic chronic kidney disease: Secondary | ICD-10-CM | POA: Diagnosis not present

## 2017-01-26 DIAGNOSIS — I129 Hypertensive chronic kidney disease with stage 1 through stage 4 chronic kidney disease, or unspecified chronic kidney disease: Secondary | ICD-10-CM | POA: Diagnosis not present

## 2017-01-27 DIAGNOSIS — E1122 Type 2 diabetes mellitus with diabetic chronic kidney disease: Secondary | ICD-10-CM | POA: Diagnosis not present

## 2017-01-27 DIAGNOSIS — N183 Chronic kidney disease, stage 3 (moderate): Secondary | ICD-10-CM | POA: Diagnosis not present

## 2017-01-27 DIAGNOSIS — I4891 Unspecified atrial fibrillation: Secondary | ICD-10-CM | POA: Diagnosis not present

## 2017-01-27 DIAGNOSIS — I129 Hypertensive chronic kidney disease with stage 1 through stage 4 chronic kidney disease, or unspecified chronic kidney disease: Secondary | ICD-10-CM | POA: Diagnosis not present

## 2017-01-27 DIAGNOSIS — E039 Hypothyroidism, unspecified: Secondary | ICD-10-CM | POA: Diagnosis not present

## 2017-01-27 DIAGNOSIS — E785 Hyperlipidemia, unspecified: Secondary | ICD-10-CM | POA: Diagnosis not present

## 2017-01-28 ENCOUNTER — Ambulatory Visit
Admission: RE | Admit: 2017-01-28 | Discharge: 2017-01-28 | Disposition: A | Discharge status: Home / Self Care | Payer: Medicare Other | Source: Ambulatory Visit | Attending: Internal Medicine | Admitting: Internal Medicine

## 2017-01-28 DIAGNOSIS — N184 Chronic kidney disease, stage 4 (severe): Secondary | ICD-10-CM | POA: Diagnosis not present

## 2017-01-28 DIAGNOSIS — R195 Other fecal abnormalities: Secondary | ICD-10-CM

## 2017-01-28 DIAGNOSIS — R1010 Upper abdominal pain, unspecified: Secondary | ICD-10-CM

## 2017-02-06 ENCOUNTER — Other Ambulatory Visit: Payer: Self-pay | Admitting: Internal Medicine

## 2017-02-10 DIAGNOSIS — K760 Fatty (change of) liver, not elsewhere classified: Secondary | ICD-10-CM | POA: Diagnosis not present

## 2017-02-10 DIAGNOSIS — R109 Unspecified abdominal pain: Secondary | ICD-10-CM | POA: Diagnosis not present

## 2017-02-10 DIAGNOSIS — E039 Hypothyroidism, unspecified: Secondary | ICD-10-CM | POA: Diagnosis not present

## 2017-02-10 DIAGNOSIS — M109 Gout, unspecified: Secondary | ICD-10-CM | POA: Diagnosis not present

## 2017-02-10 DIAGNOSIS — R609 Edema, unspecified: Secondary | ICD-10-CM | POA: Diagnosis not present

## 2017-02-10 DIAGNOSIS — Z0000 Encounter for general adult medical examination without abnormal findings: Secondary | ICD-10-CM | POA: Diagnosis not present

## 2017-02-10 DIAGNOSIS — E7800 Pure hypercholesterolemia, unspecified: Secondary | ICD-10-CM | POA: Diagnosis not present

## 2017-02-10 DIAGNOSIS — Z23 Encounter for immunization: Secondary | ICD-10-CM | POA: Diagnosis not present

## 2017-02-10 DIAGNOSIS — E559 Vitamin D deficiency, unspecified: Secondary | ICD-10-CM | POA: Diagnosis not present

## 2017-02-10 DIAGNOSIS — E1122 Type 2 diabetes mellitus with diabetic chronic kidney disease: Secondary | ICD-10-CM | POA: Diagnosis not present

## 2017-02-10 DIAGNOSIS — I4891 Unspecified atrial fibrillation: Secondary | ICD-10-CM | POA: Diagnosis not present

## 2017-02-10 DIAGNOSIS — N184 Chronic kidney disease, stage 4 (severe): Secondary | ICD-10-CM | POA: Diagnosis not present

## 2017-02-19 ENCOUNTER — Telehealth: Payer: Self-pay | Admitting: Cardiology

## 2017-02-19 ENCOUNTER — Ambulatory Visit (INDEPENDENT_AMBULATORY_CARE_PROVIDER_SITE_OTHER): Payer: Medicare Other | Admitting: *Deleted

## 2017-02-19 DIAGNOSIS — I480 Paroxysmal atrial fibrillation: Secondary | ICD-10-CM

## 2017-02-19 NOTE — Telephone Encounter (Signed)
Spoke with pt and reminded pt of remote transmission that is due today. Pt verbalized understanding.   

## 2017-02-19 NOTE — Progress Notes (Signed)
Remote pacemaker transmission.   

## 2017-02-20 ENCOUNTER — Encounter: Payer: Self-pay | Admitting: Cardiology

## 2017-03-02 LAB — CUP PACEART REMOTE DEVICE CHECK
Brady Statistic AP VS Percent: 96.3 %
Brady Statistic AS VP Percent: 0.25 %
Brady Statistic RA Percent Paced: 95.49 %
Implantable Lead Location: 753860
Implantable Lead Model: 5076
Lead Channel Impedance Value: 380 Ohm
Lead Channel Pacing Threshold Pulse Width: 0.4 ms
Lead Channel Pacing Threshold Pulse Width: 0.4 ms
Lead Channel Sensing Intrinsic Amplitude: 0.625 mV
Lead Channel Sensing Intrinsic Amplitude: 10.25 mV
Lead Channel Sensing Intrinsic Amplitude: 10.25 mV
Lead Channel Setting Pacing Pulse Width: 0.4 ms
MDC IDC LEAD IMPLANT DT: 20151217
MDC IDC LEAD IMPLANT DT: 20151217
MDC IDC LEAD LOCATION: 753859
MDC IDC MSMT BATTERY REMAINING LONGEVITY: 85 mo
MDC IDC MSMT BATTERY VOLTAGE: 3.01 V
MDC IDC MSMT LEADCHNL RA IMPEDANCE VALUE: 456 Ohm
MDC IDC MSMT LEADCHNL RA PACING THRESHOLD AMPLITUDE: 0.75 V
MDC IDC MSMT LEADCHNL RA SENSING INTR AMPL: 0.625 mV
MDC IDC MSMT LEADCHNL RV IMPEDANCE VALUE: 456 Ohm
MDC IDC MSMT LEADCHNL RV IMPEDANCE VALUE: 475 Ohm
MDC IDC MSMT LEADCHNL RV PACING THRESHOLD AMPLITUDE: 0.75 V
MDC IDC PG IMPLANT DT: 20151217
MDC IDC SESS DTM: 20181011161251
MDC IDC SET LEADCHNL RA PACING AMPLITUDE: 2 V
MDC IDC SET LEADCHNL RV PACING AMPLITUDE: 2.5 V
MDC IDC SET LEADCHNL RV SENSING SENSITIVITY: 0.9 mV
MDC IDC STAT BRADY AP VP PERCENT: 0.27 %
MDC IDC STAT BRADY AS VS PERCENT: 3.18 %
MDC IDC STAT BRADY RV PERCENT PACED: 0.52 %

## 2017-04-06 DIAGNOSIS — E2839 Other primary ovarian failure: Secondary | ICD-10-CM | POA: Diagnosis not present

## 2017-04-06 DIAGNOSIS — M810 Age-related osteoporosis without current pathological fracture: Secondary | ICD-10-CM | POA: Diagnosis not present

## 2017-04-17 DIAGNOSIS — E1122 Type 2 diabetes mellitus with diabetic chronic kidney disease: Secondary | ICD-10-CM | POA: Diagnosis not present

## 2017-04-17 DIAGNOSIS — K219 Gastro-esophageal reflux disease without esophagitis: Secondary | ICD-10-CM | POA: Diagnosis not present

## 2017-04-17 DIAGNOSIS — I4891 Unspecified atrial fibrillation: Secondary | ICD-10-CM | POA: Diagnosis not present

## 2017-04-17 DIAGNOSIS — N184 Chronic kidney disease, stage 4 (severe): Secondary | ICD-10-CM | POA: Diagnosis not present

## 2017-04-17 DIAGNOSIS — M810 Age-related osteoporosis without current pathological fracture: Secondary | ICD-10-CM | POA: Diagnosis not present

## 2017-04-17 DIAGNOSIS — M109 Gout, unspecified: Secondary | ICD-10-CM | POA: Diagnosis not present

## 2017-04-17 DIAGNOSIS — R195 Other fecal abnormalities: Secondary | ICD-10-CM | POA: Diagnosis not present

## 2017-04-17 DIAGNOSIS — Z794 Long term (current) use of insulin: Secondary | ICD-10-CM | POA: Diagnosis not present

## 2017-04-26 ENCOUNTER — Other Ambulatory Visit: Payer: Self-pay | Admitting: Internal Medicine

## 2017-04-26 DIAGNOSIS — I4891 Unspecified atrial fibrillation: Secondary | ICD-10-CM

## 2017-04-27 NOTE — Telephone Encounter (Signed)
Pt last saw Dr Lovena Le 07/08/16, last labs 06/26/16 Creat 1.97, age 82, weight 78kg, based on specified criteria pt is on appropriate dosage of Eliquis 2.5mg  BID.  Will refill rx.

## 2017-05-07 ENCOUNTER — Encounter: Payer: Self-pay | Admitting: Internal Medicine

## 2017-05-07 ENCOUNTER — Ambulatory Visit (INDEPENDENT_AMBULATORY_CARE_PROVIDER_SITE_OTHER): Payer: Medicare Other | Admitting: Internal Medicine

## 2017-05-07 VITALS — BP 130/74 | HR 74 | Ht 61.5 in | Wt 179.2 lb

## 2017-05-07 DIAGNOSIS — I495 Sick sinus syndrome: Secondary | ICD-10-CM

## 2017-05-07 DIAGNOSIS — I480 Paroxysmal atrial fibrillation: Secondary | ICD-10-CM | POA: Diagnosis not present

## 2017-05-07 DIAGNOSIS — Z5181 Encounter for therapeutic drug level monitoring: Secondary | ICD-10-CM | POA: Diagnosis not present

## 2017-05-07 DIAGNOSIS — Z950 Presence of cardiac pacemaker: Secondary | ICD-10-CM

## 2017-05-07 DIAGNOSIS — Z79899 Other long term (current) drug therapy: Secondary | ICD-10-CM | POA: Diagnosis not present

## 2017-05-07 NOTE — Patient Instructions (Addendum)
Medication Instructions:  Your physician recommends that you continue on your current medications as directed. Please refer to the Current Medication list given to you today.  Labwork: None ordered.  Testing/Procedures: None ordered.  Follow-Up: Your physician wants you to follow-up in: one year with Dr. Lovena Le.   You will receive a reminder letter in the mail two months in advance. If you don't receive a letter, please call our office to schedule the follow-up appointment.  Remote monitoring is used to monitor your Pacemaker from home. This monitoring reduces the number of office visits required to check your device to one time per year. It allows [place] to keep an eye on the functioning of your device to ensure it is working properly. You are scheduled for a device check from home on 05/21/2017. You may send your transmission at any time that day. If you have a wireless device, the transmission will be sent automatically. After your physician reviews your transmission, you will receive a postcard with your next transmission date.   Any Other Special Instructions Will Be Listed Below (If Applicable).  If you need a refill on your cardiac medications before your next appointment, please call your pharmacy.

## 2017-05-07 NOTE — Progress Notes (Signed)
HPI Kathryn Richardson returns today for ongoing evaluation of paroxysmal atrial fibrillation and sinus node dysfunction and hypertension.  The patient failed flecainide but was placed on dofetilide many months ago and has done well in the interim.  She is exquisitely sensitive to her atrial fibrillation.  She thinks that she goes out of rhythm most days for 1-3 hours.  She has not had syncope.  Overall she is stable.  She denies chest pain or shortness of breath.  She has palpitations when she goes out of rhythm. Allergies  Allergen Reactions  . Cortisone Other (See Comments)    Hyperglycemia   . Prednisone Other (See Comments)    Hyperglycemia      Current Outpatient Medications  Medication Sig Dispense Refill  . allopurinol (ZYLOPRIM) 100 MG tablet Take 200 mg by mouth at bedtime.     Marland Kitchen atorvastatin (LIPITOR) 80 MG tablet Take 80 mg by mouth at bedtime.     . benazepril (LOTENSIN) 20 MG tablet Take 20 mg by mouth daily.    Marland Kitchen CARTIA XT 240 MG 24 hr capsule TAKE ONE CAPSULE BY MOUTH EVERY DAY 90 capsule 3  . diltiazem (CARDIZEM) 30 MG tablet Take 1 tablet every 4 hours AS NEEDED for afib heart rate 90 or greater. 45 tablet 2  . dofetilide (TIKOSYN) 125 MCG capsule TAKE 1 CAPSULE(125 MCG) BY MOUTH TWICE DAILY 60 capsule 7  . ELIQUIS 2.5 MG TABS tablet TAKE 1 TABLET BY MOUTH TWICE DAILY 60 tablet 6  . furosemide (LASIX) 40 MG tablet Take 40 mg by mouth daily.    Marland Kitchen LEVEMIR FLEXTOUCH 100 UNIT/ML Pen Inject 62 Units into the skin every morning.     Marland Kitchen levothyroxine (SYNTHROID, LEVOTHROID) 50 MCG tablet Take 50 mcg by mouth daily. On a empty stomach.    . potassium chloride SA (K-DUR,KLOR-CON) 20 MEQ tablet TAKE 1 AND 1/2 TABLETS(30 MEQ) BY MOUTH DAILY 45 tablet 7  . repaglinide (PRANDIN) 2 MG tablet Take 1 tablet twice daily 15 to 30 minutes before meals  10   No current facility-administered medications for this visit.      Past Medical History:  Diagnosis Date  . Allergic rhinitis     . Atrial fibrillation ([place])   . Chronic renal disease, stage IV ([place])   . Essential hypertension   . GERD (gastroesophageal reflux disease)   . Gout   . Hyperlipemia, mixed   . Insomnia   . Type 2 diabetes mellitus (HCC)     ROS:   All systems reviewed and negative except as noted in the HPI.   Past Surgical History:  Procedure Laterality Date  . CARDIOVERSION N/A 05/29/2014   Procedure: CARDIOVERSION;  Surgeon: Fay Records, MD;  Location[facility] ENDOSCOPY;  Service: Cardiovascular;  Laterality: N/A;  . CARDIOVERSION N/A 11/03/2014   Procedure: CARDIOVERSION;  Surgeon: Thayer Headings, MD;  Location: [place];  Service: Cardiovascular;  Laterality: N/A;  . CARDIOVERSION N/A 04/14/2016   Procedure: CARDIOVERSION;  Surgeon: Jerline Pain, MD;  Location: [place];  Service: Cardiovascular;  Laterality: N/A;  . CESAREAN SECTION     x2  . ESOPHAGEAL DILATION    . PERMANENT PACEMAKER INSERTION N/A 04/26/2014   Procedure: PERMANENT PACEMAKER INSERTION;  Surgeon: Evans Lance, MD;  Location: [facility] CATH LAB;  Service: Cardiovascular;  Laterality: N/A;  . TEE WITHOUT CARDIOVERSION N/A 05/29/2014   Procedure: TRANSESOPHAGEAL ECHOCARDIOGRAM (TEE);  Surgeon: Fay Records, MD;  Location: [facility] ENDOSCOPY;  Service: Cardiovascular;  Laterality: N/A;     Family History  Problem Relation Age of Onset  . Coronary artery disease Father 65  . Heart attack Father   . Congestive Heart Failure Mother   . Alzheimer's disease Mother      Social History   Socioeconomic History  . Marital status: Married    Spouse name: Not on file  . Number of children: Not on file  . Years of education: Not on file  . Highest education level: Not on file  Social Needs  . Financial resource strain: Not on file  . Food insecurity - worry: Not on file  . Food insecurity - inability: Not on file  . Transportation needs - medical: Not on file  . Transportation needs - non-medical: Not on file  Occupational  History  . Not on file  Tobacco Use  . Smoking status: Never Smoker  . Smokeless tobacco: Never Used  Substance and Sexual Activity  . Alcohol use: No    Alcohol/week: 0.0 oz  . Drug use: No  . Sexual activity: Yes    Birth control/protection: Other-see comments  Other Topics Concern  . Not on file  Social History Narrative   Lives in [place].  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     BP 130/74   Pulse 74   Ht 5' 1.5" (1.562 m)   Wt 179 lb 3.2 oz (81.3 kg)   SpO2 97%   BMI 33.31 kg/m   Physical Exam:  Well appearing 82 year old woman, NAD HEENT: Unremarkable Neck: 6 cm JVD, no thyromegally Lymphatics:  No adenopathy Back:  No CVA tenderness Lungs:  Clear, with no wheezes, rales, or rhonchi HEART:  Regular rate rhythm, no murmurs, no rubs, no clicks Abd:  soft, positive bowel sounds, no organomegally, no rebound, no guarding Ext:  2 plus pulses, no edema, no cyanosis, no clubbing Skin:  No rashes no nodules Neuro:  CN II through XII intact, motor grossly intact   DEVICE  Normal device function.  See PaceArt for details.   Assess/Plan: 1.  Paroxysmal atrial fibrillation -she is stable on dofetilide therapy.  We discussed the density and duration of her atrial fibrillation.  I have recommended she continue her current medications. 2.  Systemic anticoagulation -she is tolerating her oral anticoagulant nicely with no bleeding episodes. 3.  Sinus node dysfunction -she is status post pacemaker insertion and has had no symptoms. 4.  Pacemaker -her Medtronic dual-chamber MRI compatible pacemaker is working normally.  She has approximately 7 years of battery longevity.  Crissie Sickles, MD

## 2017-05-08 LAB — CUP PACEART INCLINIC DEVICE CHECK
Battery Remaining Longevity: 84 mo
Brady Statistic AS VP Percent: 0.31 %
Brady Statistic RA Percent Paced: 95.31 %
Date Time Interrogation Session: 20181227184235
Implantable Lead Implant Date: 20151217
Implantable Lead Location: 753860
Implantable Lead Model: 5076
Lead Channel Impedance Value: 437 Ohm
Lead Channel Pacing Threshold Amplitude: 0.75 V
Lead Channel Pacing Threshold Pulse Width: 0.4 ms
Lead Channel Sensing Intrinsic Amplitude: 0.75 mV
Lead Channel Sensing Intrinsic Amplitude: 10.25 mV
Lead Channel Setting Pacing Pulse Width: 0.4 ms
Lead Channel Setting Sensing Sensitivity: 0.9 mV
MDC IDC LEAD IMPLANT DT: 20151217
MDC IDC LEAD LOCATION: 753859
MDC IDC MSMT BATTERY VOLTAGE: 3.01 V
MDC IDC MSMT LEADCHNL RA IMPEDANCE VALUE: 342 Ohm
MDC IDC MSMT LEADCHNL RA PACING THRESHOLD AMPLITUDE: 0.625 V
MDC IDC MSMT LEADCHNL RA SENSING INTR AMPL: 0.875 mV
MDC IDC MSMT LEADCHNL RV IMPEDANCE VALUE: 437 Ohm
MDC IDC MSMT LEADCHNL RV IMPEDANCE VALUE: 456 Ohm
MDC IDC MSMT LEADCHNL RV PACING THRESHOLD PULSEWIDTH: 0.4 ms
MDC IDC MSMT LEADCHNL RV SENSING INTR AMPL: 8.75 mV
MDC IDC PG IMPLANT DT: 20151217
MDC IDC SET LEADCHNL RA PACING AMPLITUDE: 2 V
MDC IDC SET LEADCHNL RV PACING AMPLITUDE: 2.5 V
MDC IDC STAT BRADY AP VP PERCENT: 0.28 %
MDC IDC STAT BRADY AP VS PERCENT: 96.14 %
MDC IDC STAT BRADY AS VS PERCENT: 3.26 %
MDC IDC STAT BRADY RV PERCENT PACED: 0.6 %

## 2017-05-18 ENCOUNTER — Ambulatory Visit: Payer: Medicare Other

## 2017-05-18 DIAGNOSIS — M810 Age-related osteoporosis without current pathological fracture: Secondary | ICD-10-CM | POA: Diagnosis not present

## 2017-05-19 ENCOUNTER — Ambulatory Visit: Payer: Medicare Other | Attending: Internal Medicine

## 2017-05-19 DIAGNOSIS — R293 Abnormal posture: Secondary | ICD-10-CM | POA: Diagnosis not present

## 2017-05-19 DIAGNOSIS — M810 Age-related osteoporosis without current pathological fracture: Secondary | ICD-10-CM | POA: Diagnosis not present

## 2017-05-19 DIAGNOSIS — M25661 Stiffness of right knee, not elsewhere classified: Secondary | ICD-10-CM | POA: Diagnosis not present

## 2017-05-19 DIAGNOSIS — R2681 Unsteadiness on feet: Secondary | ICD-10-CM | POA: Diagnosis not present

## 2017-05-19 DIAGNOSIS — M25662 Stiffness of left knee, not elsewhere classified: Secondary | ICD-10-CM | POA: Insufficient documentation

## 2017-05-19 DIAGNOSIS — M6281 Muscle weakness (generalized): Secondary | ICD-10-CM | POA: Diagnosis not present

## 2017-05-19 NOTE — Therapy (Signed)
[place], [place], [address] Phone: [phone]   Fax:  [phone]  Physical Therapy Evaluation  Patient Details  Name: Kathryn Richardson MRN: [id number] Date of Birth: 1935/03/08 Referring Provider: Minette Brine, MD   Encounter Date: 05/19/2017  PT End of Session - 05/19/17 0917    Visit Number  1    Number of Visits  8    Date for PT Re-Evaluation  06/19/17    Authorization Type  Medicare    PT Start Time  0923    PT Stop Time  1005    PT Time Calculation (min)  42 min    Activity Tolerance  Patient tolerated treatment well    Behavior During Therapy  [facility] for tasks assessed/performed       Past Medical History:  Diagnosis Date  . Allergic rhinitis   . Atrial fibrillation ([place])   . Chronic renal disease, stage IV ([place])   . Essential hypertension   . GERD (gastroesophageal reflux disease)   . Gout   . Hyperlipemia, mixed   . Insomnia   . Type 2 diabetes mellitus ([place])     Past Surgical History:  Procedure Laterality Date  . CARDIOVERSION N/A 05/29/2014   Procedure: CARDIOVERSION;  Surgeon: Fay Records, MD;  Location: [facility] ENDOSCOPY;  Service: Cardiovascular;  Laterality: N/A;  . CARDIOVERSION N/A 11/03/2014   Procedure: CARDIOVERSION;  Surgeon: Thayer Headings, MD;  Location: [employer];  Service: Cardiovascular;  Laterality: N/A;  . CARDIOVERSION N/A 04/14/2016   Procedure: CARDIOVERSION;  Surgeon: Jerline Pain, MD;  Location: [place];  Service: Cardiovascular;  Laterality: N/A;  . CESAREAN SECTION     x2  . ESOPHAGEAL DILATION    . PERMANENT PACEMAKER INSERTION N/A 04/26/2014   Procedure: PERMANENT PACEMAKER INSERTION;  Surgeon: Evans Lance, MD;  Location: [facility] CATH LAB;  Service: Cardiovascular;  Laterality: N/A;  . TEE WITHOUT CARDIOVERSION N/A 05/29/2014   Procedure: TRANSESOPHAGEAL ECHOCARDIOGRAM (TEE);  Surgeon: Fay Records, MD;  Location: [place];  Service: Cardiovascular;   Laterality: N/A;    There were no vitals filed for this visit.   Subjective Assessment - 05/19/17 0924    Subjective  She reports brittle bones needs strengthening.  No fractures so far.  Poor stamina.    Patient is accompained by:  Family member in lobby    Pertinent History  PACEMAKER    Limitations  Walking    Diagnostic tests  bone density testing    Patient Stated Goals  Improve strength    Currently in Pain?  No/denies         [facility] PT Assessment - 05/19/17 0001      Assessment   Medical Diagnosis  osteoporosis    Referring Provider  Minette Brine, MD    Onset Date/Surgical Date  -- 04/2017    Next MD Visit  07/2017    Prior Therapy  No      Precautions   Precaution Comments  osteoporosis , limit flexion of spine      Restrictions   Weight Bearing Restrictions  No      Balance Screen   Has the patient fallen in the past 6 months  No      Prior Function   Level of Independence  Independent      Cognition   Overall Cognitive Status  Within Functional Limits for tasks assessed      Observation/Other Assessments   Focus on Therapeutic  Outcomes (FOTO)   40% limited      Posture/Postural Control   Posture Comments  flexed knees incr thoracic kyphosis , forward head       ROM / Strength   AROM / PROM / Strength  AROM;Strength      AROM   Overall AROM Comments  ankle DF 93 degrees rt 95 degrees LT        AROM Assessment Site  Lumbar;Knee    Right/Left Knee  Right;Left    Right Knee Extension  -15    Right Knee Flexion  122    Left Knee Extension  -15    Left Knee Flexion  122    Lumbar Flexion  70    Lumbar Extension  10    Lumbar - Right Side Bend  10    Lumbar - Left Side Bend  10      Strength   Overall Strength Comments  Bilateral thighs ,knees and ankle 4+ to 5/5      Flexibility   Soft Tissue Assessment /Muscle Length  yes    Hamstrings  70 degrees      Standardized Balance Assessment   Standardized Balance Assessment  Berg Balance Test       Berg Balance Test   Sit to Stand  Able to stand without using hands and stabilize independently    Standing Unsupported  Able to stand safely 2 minutes    Sitting with Back Unsupported but Feet Supported on Floor or Stool  Able to sit safely and securely 2 minutes    Stand to Sit  Sits safely with minimal use of hands    Transfers  Able to transfer safely, minor use of hands    Standing Unsupported with Eyes Closed  Able to stand 10 seconds safely    Standing Ubsupported with Feet Together  Able to place feet together independently and stand 1 minute safely    From Standing, Reach Forward with Outstretched Arm  Can reach forward >12 cm safely (5")    From Standing Position, Pick up Object from Floor  Able to pick up shoe safely and easily    From Standing Position, Turn to Look Behind Over each Shoulder  Turn sideways only but maintains balance    Turn 360 Degrees  Able to turn 360 degrees safely but slowly    Standing Unsupported, Alternately Place Feet on Step/Stool  Able to complete 4 steps without aid or supervision    Standing Unsupported, One Foot in Front  Able to take small step independently and hold 30 seconds    Standing on One Leg  Tries to lift leg/unable to hold 3 seconds but remains standing independently    Total Score  44             Objective measurements completed on examination: See above findings.      [place] Adult PT Treatment/Exercise - 05/19/17 0001      Neck Exercises: Supine   Other Supine Exercise  Decompression exer  for HEP             PT Education - 05/19/17 0920    Education provided  Yes    Education Details  POC , need to limt flexion and twisting, cervical and scapula retraction    Person(s) Educated  Patient    Methods  Explanation    Comprehension  Verbalized understanding          PT Long Term Goals - 05/19/17 1014  PT LONG TERM GOAL #1   Title  She will be independent with all HEP issued    Time  4    Period  Weeks     Status  New      PT LONG TERM GOAL #2   Title  Berg score will improve to 50/56 or better to demo improved balance     Time  4    Period  Weeks    Status  New      PT LONG TERM GOAL #3   Title  Pt willl demo awareness of good posture nad limit flexed and loaded postures    Time  4    Period  Weeks    Status  New             Plan - 05/19/17 4132    Clinical Impression Statement  Ms Kathryn Richardson presents with report of osteporosis  , decreased balance, stiffness of knees and ankles. She would benefit fdrom HEP for general strengthening , osteoporosis ed, stretching of LE joints , balance activity    Clinical Presentation  Stable    Clinical Decision Making  Low    Rehab Potential  Good    PT Frequency  2x / week    PT Duration  4 weeks    PT Treatment/Interventions  Therapeutic activities;Therapeutic exercise;Patient/family education;Balance training;Passive range of motion    PT Next Visit Plan  review and progress HEP, balance activity    PT Home Exercise Plan  decompression scap retraction and cervical retraction and capital flexion    Consulted and Agree with Plan of Care  Patient       Patient will benefit from skilled therapeutic intervention in order to improve the following deficits and impairments:  Decreased balance, Impaired flexibility, Difficulty walking, Decreased strength, Postural dysfunction  Visit Diagnosis: Age-related osteoporosis without current pathological fracture - Plan: PT plan of care cert/re-cert  Stiffness of both knees - Plan: PT plan of care cert/re-cert  Muscle weakness (generalized) - Plan: PT plan of care cert/re-cert  Abnormal posture - Plan: PT plan of care cert/re-cert  Unsteadiness on feet - Plan: PT plan of care cert/re-cert     Problem List Patient Active Problem List   Diagnosis Date Noted  . Status post dilation of esophageal narrowing 07/08/2016  . Visit for monitoring Tikosyn therapy 06/16/2016  . Pacemaker 08/01/2014  .  Uncontrolled diabetes mellitus ([place]) 05/26/2014  . Acute renal failure superimposed on stage 3 chronic kidney disease ([place]) 05/26/2014  . Essential hypertension 05/26/2014  . Hyperkalemia 05/26/2014  . GERD (gastroesophageal reflux disease) 05/26/2014  . Hypothyroidism 05/26/2014  . Gout 04/26/2014  . Atrial fibrillation with rapid ventricular response ([place]) 04/26/2014  . Syncope 04/25/2014  . Atrial flutter with rapid ventricular response ([place]) 04/25/2014  . Chronic kidney disease, stage III (moderate) ([place]) 10/05/2013  . Encounter for therapeutic drug monitoring 06/06/2013  . Essential hypertension, benign 03/25/2013  . Long term current use of anticoagulant therapy 03/25/2013  . Hyperlipidemia 03/25/2013  . Paroxysmal atrial fibrillation 09/03/2011  . Bradycardia 09/03/2011  . Allergic rhinitis 02/09/2011  . Diabetes mellitus ([place]) 02/09/2011  . Vitamin D deficiency 02/09/2011    Darrel Hoover  PT 05/19/2017, 10:21 AM  [facility] [address] [place], [place], [address] Phone: [phone]   Fax:  [phone]  Name: Kathryn Richardson MRN: [id number] Date of Birth: 04-04-1935

## 2017-05-19 NOTE — Patient Instructions (Signed)
Cervical retraction with capital flexion and scapula retraction  2-3x/day x 5 reps 5 sec hold

## 2017-05-21 ENCOUNTER — Ambulatory Visit (INDEPENDENT_AMBULATORY_CARE_PROVIDER_SITE_OTHER): Payer: Medicare Other | Admitting: *Deleted

## 2017-05-21 DIAGNOSIS — I495 Sick sinus syndrome: Secondary | ICD-10-CM

## 2017-05-21 NOTE — Progress Notes (Signed)
Remote pacemaker transmission.   

## 2017-05-26 ENCOUNTER — Encounter: Payer: Self-pay | Admitting: Physical Therapy

## 2017-05-26 ENCOUNTER — Ambulatory Visit: Payer: Medicare Other | Admitting: Physical Therapy

## 2017-05-26 DIAGNOSIS — M25661 Stiffness of right knee, not elsewhere classified: Secondary | ICD-10-CM

## 2017-05-26 DIAGNOSIS — M810 Age-related osteoporosis without current pathological fracture: Secondary | ICD-10-CM

## 2017-05-26 DIAGNOSIS — M6281 Muscle weakness (generalized): Secondary | ICD-10-CM | POA: Diagnosis not present

## 2017-05-26 DIAGNOSIS — M25662 Stiffness of left knee, not elsewhere classified: Secondary | ICD-10-CM | POA: Diagnosis not present

## 2017-05-26 DIAGNOSIS — R293 Abnormal posture: Secondary | ICD-10-CM | POA: Diagnosis not present

## 2017-05-26 DIAGNOSIS — R2681 Unsteadiness on feet: Secondary | ICD-10-CM | POA: Diagnosis not present

## 2017-05-26 NOTE — Patient Instructions (Signed)
Balance   In a corner with a chair in front.  1.Stand with feet together  Move head  look right/ left/ up/down  1-2 x a day  5-10 X each   2. Stand with feet heel to toe  Stand in place up to 1 minute  Stand in place with head movements 5 to 10 X Change foot position  Rest as needed.

## 2017-05-26 NOTE — Therapy (Signed)
[place], [place], [address] Phone: [phone]   Fax:  [phone]  Physical Therapy Treatment  Patient Details  Name: Kathryn Richardson MRN: [id number] Date of Birth: [birth year] Referring Provider: Minette Brine, MD   Encounter Date: 05/26/2017  PT End of Session - 05/26/17 1152    Visit Number  2    Number of Visits  8    Date for PT Re-Evaluation  06/19/17    PT Start Time  1104    PT Stop Time  1145    PT Time Calculation (min)  41 min    Activity Tolerance  Patient tolerated treatment well    Behavior During Therapy  [facility] for tasks assessed/performed       Past Medical History:  Diagnosis Date  . Allergic rhinitis   . Atrial fibrillation ([place])   . Chronic renal disease, stage IV ([place])   . Essential hypertension   . GERD (gastroesophageal reflux disease)   . Gout   . Hyperlipemia, mixed   . Insomnia   . Type 2 diabetes mellitus ([place])     Past Surgical History:  Procedure Laterality Date  . CARDIOVERSION N/A 05/29/2014   Procedure: CARDIOVERSION;  Surgeon: Fay Records, MD;  Location: [facility] ENDOSCOPY;  Service: Cardiovascular;  Laterality: N/A;  . CARDIOVERSION N/A 11/03/2014   Procedure: CARDIOVERSION;  Surgeon: Thayer Headings, MD;  Location: [place];  Service: Cardiovascular;  Laterality: N/A;  . CARDIOVERSION N/A 04/14/2016   Procedure: CARDIOVERSION;  Surgeon: Jerline Pain, MD;  Location: [place];  Service: Cardiovascular;  Laterality: N/A;  . CESAREAN SECTION     x2  . ESOPHAGEAL DILATION    . PERMANENT PACEMAKER INSERTION N/A 04/26/2014   Procedure: PERMANENT PACEMAKER INSERTION;  Surgeon: Evans Lance, MD;  Location: [facility] CATH LAB;  Service: Cardiovascular;  Laterality: N/A;  . TEE WITHOUT CARDIOVERSION N/A 05/29/2014   Procedure: TRANSESOPHAGEAL ECHOCARDIOGRAM (TEE);  Surgeon: Fay Records, MD;  Location: [place];  Service: Cardiovascular;  Laterality: N/A;    There were  no vitals filed for this visit.  Subjective Assessment - 05/26/17 1110    Subjective  She has been getting sore from the exercise.  it has improved over the last few days.  Normally she does not have any pain.     Currently in Pain?  -- mild soreness upper back and right lower back    Multiple Pain Sites  No                      [facility] Adult PT Treatment/Exercise - 05/26/17 0001      High Level Balance   High Level Balance Comments  in corner static and dynamic balance challanges with reaching,  head movements,  static,  feet together and in tandem,   HEO,  close SBA        Self-Care   Self-Care  -- Do's/ Don'ts for osteoporosis,  reviewed.        Lumbar Exercises: Standing   Other Standing Lumbar Exercises  encouraged longer step length and trunk rotation      Knee/Hip Exercises: Stretches   Gastroc Stretch  3 reps;30 seconds at counter      Knee/Hip Exercises: Supine   Other Supine Knee/Hip Exercises  Decompression series all practiced.  decompression 1 minute,  other ex 5 seconds each             PT Education - 05/26/17 1151  Education provided  Yes    Education Details  HEP and Self care    Methods  Explanation;Demonstration;Verbal cues;Handout    Comprehension  Returned demonstration          PT Long Term Goals - 05/26/17 1155      PT LONG TERM GOAL #1   Title  She will be independent with all HEP issued    Baseline  cues needed,  minor    Time  4    Period  Weeks    Status  On-going      PT LONG TERM GOAL #2   Title  Berg score will improve to 50/56 or better to demo improved balance     Time  4    Period  Weeks    Status  Unable to assess      PT LONG TERM GOAL #3   Title  Pt willl demo awareness of good posture nad limit flexed and loaded postures    Baseline  ed began    Time  4    Period  Weeks    Status  On-going            Plan - 05/26/17 1152    Clinical Impression Statement  Able to progress HEP for balance today.   She has been doing the HEP.  soreness noted with exercise.  A pillow helpful with LE exercises.  No pain increasee at end of session.    PT Next Visit Plan  review and progress HEP, balance activity    PT Home Exercise Plan  decompression scap retraction and cervical retraction and capital flexion.  balance in corner    Consulted and Agree with Plan of Care  Patient       Patient will benefit from skilled therapeutic intervention in order to improve the following deficits and impairments:     Visit Diagnosis: Age-related osteoporosis without current pathological fracture  Stiffness of both knees  Muscle weakness (generalized)  Abnormal posture  Unsteadiness on feet     Problem List Patient Active Problem List   Diagnosis Date Noted  . Status post dilation of esophageal narrowing 07/08/2016  . Visit for monitoring Tikosyn therapy 06/16/2016  . Pacemaker 08/01/2014  . Uncontrolled diabetes mellitus ([place]) 05/26/2014  . Acute renal failure superimposed on stage 3 chronic kidney disease ([place]) 05/26/2014  . Essential hypertension 05/26/2014  . Hyperkalemia 05/26/2014  . GERD (gastroesophageal reflux disease) 05/26/2014  . Hypothyroidism 05/26/2014  . Gout 04/26/2014  . Atrial fibrillation with rapid ventricular response ([place]) 04/26/2014  . Syncope 04/25/2014  . Atrial flutter with rapid ventricular response ([place]) 04/25/2014  . Chronic kidney disease, stage III (moderate) ([place]) 10/05/2013  . Encounter for therapeutic drug monitoring 06/06/2013  . Essential hypertension, benign 03/25/2013  . Long term current use of anticoagulant therapy 03/25/2013  . Hyperlipidemia 03/25/2013  . Paroxysmal atrial fibrillation 09/03/2011  . Bradycardia 09/03/2011  . Allergic rhinitis 02/09/2011  . Diabetes mellitus ([place]) 02/09/2011  . Vitamin D deficiency 02/09/2011    Alaia Lordi,Margarethe  PTA 05/26/2017, 1:04 PM  [facility] [address] [place], [place], [address] Phone: [phone]   Fax:  [phone]  Name: ANNETA ROUNDS MRN: [id number] Date of Birth: 1934-11-14

## 2017-05-28 ENCOUNTER — Ambulatory Visit: Payer: Medicare Other

## 2017-05-28 DIAGNOSIS — R2681 Unsteadiness on feet: Secondary | ICD-10-CM | POA: Diagnosis not present

## 2017-05-28 DIAGNOSIS — M25662 Stiffness of left knee, not elsewhere classified: Secondary | ICD-10-CM

## 2017-05-28 DIAGNOSIS — R293 Abnormal posture: Secondary | ICD-10-CM

## 2017-05-28 DIAGNOSIS — M810 Age-related osteoporosis without current pathological fracture: Secondary | ICD-10-CM

## 2017-05-28 DIAGNOSIS — M25661 Stiffness of right knee, not elsewhere classified: Secondary | ICD-10-CM

## 2017-05-28 DIAGNOSIS — M6281 Muscle weakness (generalized): Secondary | ICD-10-CM | POA: Diagnosis not present

## 2017-05-28 NOTE — Therapy (Signed)
[place], [place], [address] Phone: [phone]   Fax:  [phone]  Physical Therapy Treatment  Patient Details  Name: Kathryn Richardson MRN: [id number] Date of Birth: July 29, 1934 Referring Provider: Minette Brine, MD   Encounter Date: 05/28/2017  PT End of Session - 05/28/17 0931    Visit Number  3    Number of Visits  8    Date for PT Re-Evaluation  06/19/17    Authorization Type  Medicare    PT Start Time  0930    PT Stop Time  1010    PT Time Calculation (min)  40 min    Activity Tolerance  Patient tolerated treatment well    Behavior During Therapy  [facility] for tasks assessed/performed       Past Medical History:  Diagnosis Date  . Allergic rhinitis   . Atrial fibrillation ([place])   . Chronic renal disease, stage IV ([place])   . Essential hypertension   . GERD (gastroesophageal reflux disease)   . Gout   . Hyperlipemia, mixed   . Insomnia   . Type 2 diabetes mellitus ([place])     Past Surgical History:  Procedure Laterality Date  . CARDIOVERSION N/A 05/29/2014   Procedure: CARDIOVERSION;  Surgeon: Fay Records, MD;  Location: [facility] ENDOSCOPY;  Service: Cardiovascular;  Laterality: N/A;  . CARDIOVERSION N/A 11/03/2014   Procedure: CARDIOVERSION;  Surgeon: Thayer Headings, MD;  Location: [place];  Service: Cardiovascular;  Laterality: N/A;  . CARDIOVERSION N/A 04/14/2016   Procedure: CARDIOVERSION;  Surgeon: Jerline Pain, MD;  Location: [place];  Service: Cardiovascular;  Laterality: N/A;  . CESAREAN SECTION     x2  . ESOPHAGEAL DILATION    . PERMANENT PACEMAKER INSERTION N/A 04/26/2014   Procedure: PERMANENT PACEMAKER INSERTION;  Surgeon: Evans Lance, MD;  Location: [facility] CATH LAB;  Service: Cardiovascular;  Laterality: N/A;  . TEE WITHOUT CARDIOVERSION N/A 05/29/2014   Procedure: TRANSESOPHAGEAL ECHOCARDIOGRAM (TEE);  Surgeon: Fay Records, MD;  Location: [place];  Service: Cardiovascular;   Laterality: N/A;    There were no vitals filed for this visit.  Subjective Assessment - 05/28/17 0936    Subjective  No pain today. Decomp and balance exercise last visit    Currently in Pain?  No/denies                      [facility] Adult PT Treatment/Exercise - 05/28/17 0001      Neuro Re-ed    Neuro Re-ed Details   Balance training with head and shoulder movements and head and shoulder /body movements graded distance and narrowness of support.    Also incorporated standing SLR into balnce activity /       Lumbar Exercises: Standing   Other Standing Lumbar Exercises  SLR 3 way not adduction RT and LT              PT Education - 05/28/17 1007    Education provided  Yes    Education Details  hip strength and balance exercise    Person(s) Educated  Patient    Methods  Explanation;Demonstration;Tactile cues;Verbal cues;Handout    Comprehension  Returned demonstration;Verbalized understanding          PT Long Term Goals - 05/26/17 1155      PT LONG TERM GOAL #1   Title  She will be independent with all HEP issued    Baseline  cues needed,  minor    Time  4    Period  Weeks    Status  On-going      PT LONG TERM GOAL #2   Title  Berg score will improve to 50/56 or better to demo improved balance     Time  4    Period  Weeks    Status  Unable to assess      PT LONG TERM GOAL #3   Title  Pt willl demo awareness of good posture nad limit flexed and loaded postures    Baseline  ed began    Time  4    Period  Weeks    Status  On-going            Plan - 05/28/17 0931    Clinical Impression Statement  Doing well without pain and though balance is a challenge she is able to do the exercsie with modification safely. PRogress strength and balance activity as able    PT Treatment/Interventions  Therapeutic activities;Therapeutic exercise;Patient/family education;Balance training;Passive range of motion    PT Next Visit Plan  review and progress HEP,  balance activity    PT Home Exercise Plan  decompression scap retraction and cervical retraction and capital flexion.  balance in corner    Consulted and Agree with Plan of Care  Patient       Patient will benefit from skilled therapeutic intervention in order to improve the following deficits and impairments:  Decreased balance, Impaired flexibility, Difficulty walking, Decreased strength, Postural dysfunction  Visit Diagnosis: Age-related osteoporosis without current pathological fracture  Stiffness of both knees  Muscle weakness (generalized)  Abnormal posture  Unsteadiness on feet     Problem List Patient Active Problem List   Diagnosis Date Noted  . Status post dilation of esophageal narrowing 07/08/2016  . Visit for monitoring Tikosyn therapy 06/16/2016  . Pacemaker 08/01/2014  . Uncontrolled diabetes mellitus ([place]) 05/26/2014  . Acute renal failure superimposed on stage 3 chronic kidney disease ([place]) 05/26/2014  . Essential hypertension 05/26/2014  . Hyperkalemia 05/26/2014  . GERD (gastroesophageal reflux disease) 05/26/2014  . Hypothyroidism 05/26/2014  . Gout 04/26/2014  . Atrial fibrillation with rapid ventricular response ([place]) 04/26/2014  . Syncope 04/25/2014  . Atrial flutter with rapid ventricular response ([place]) 04/25/2014  . Chronic kidney disease, stage III (moderate) ([place]) 10/05/2013  . Encounter for therapeutic drug monitoring 06/06/2013  . Essential hypertension, benign 03/25/2013  . Long term current use of anticoagulant therapy 03/25/2013  . Hyperlipidemia 03/25/2013  . Paroxysmal atrial fibrillation 09/03/2011  . Bradycardia 09/03/2011  . Allergic rhinitis 02/09/2011  . Diabetes mellitus ([place]) 02/09/2011  . Vitamin D deficiency 02/09/2011    Darrel Hoover  PT 05/28/2017, 10:11 AM  [facility] [address] [place], [place], [address] Phone: [phone]   Fax:  [phone]  Name:  Kathryn Richardson MRN: [id number] Date of Birth: 05/27/1934

## 2017-05-28 NOTE — Patient Instructions (Signed)
Standing SLR except adduction 1x/day 10-20 reps cautioned to use appropriate support to dec risk of fall but to challenge balance modifying distance and speed as needed

## 2017-05-29 ENCOUNTER — Encounter: Payer: Self-pay | Admitting: Cardiology

## 2017-06-02 ENCOUNTER — Ambulatory Visit: Payer: Medicare Other | Admitting: Physical Therapy

## 2017-06-02 DIAGNOSIS — M25661 Stiffness of right knee, not elsewhere classified: Secondary | ICD-10-CM

## 2017-06-02 DIAGNOSIS — R2681 Unsteadiness on feet: Secondary | ICD-10-CM

## 2017-06-02 DIAGNOSIS — M6281 Muscle weakness (generalized): Secondary | ICD-10-CM

## 2017-06-02 DIAGNOSIS — R293 Abnormal posture: Secondary | ICD-10-CM

## 2017-06-02 DIAGNOSIS — M810 Age-related osteoporosis without current pathological fracture: Secondary | ICD-10-CM

## 2017-06-02 DIAGNOSIS — M25662 Stiffness of left knee, not elsewhere classified: Secondary | ICD-10-CM

## 2017-06-02 NOTE — Therapy (Signed)
[place], [place], [address] Phone: [phone]   Fax:  [phone]  Physical Therapy Treatment  Patient Details  Name: Kathryn Richardson MRN: [id number] Date of Birth: 02/01/1935 Referring Provider: Minette Brine, MD   Encounter Date: 06/02/2017  PT End of Session - 06/02/17 1134    PT Start Time  1101    PT Stop Time  1105    PT Time Calculation (min)  4 min    Activity Tolerance  Other (comment) No activity,  no treatment       Past Medical History:  Diagnosis Date  . Allergic rhinitis   . Atrial fibrillation ([place])   . Chronic renal disease, stage IV ([place])   . Essential hypertension   . GERD (gastroesophageal reflux disease)   . Gout   . Hyperlipemia, mixed   . Insomnia   . Type 2 diabetes mellitus ([place])     Past Surgical History:  Procedure Laterality Date  . CARDIOVERSION N/A 05/29/2014   Procedure: CARDIOVERSION;  Surgeon: Fay Records, MD;  Location: [facility] ENDOSCOPY;  Service: Cardiovascular;  Laterality: N/A;  . CARDIOVERSION N/A 11/03/2014   Procedure: CARDIOVERSION;  Surgeon: Thayer Headings, MD;  Location: [place];  Service: Cardiovascular;  Laterality: N/A;  . CARDIOVERSION N/A 04/14/2016   Procedure: CARDIOVERSION;  Surgeon: Jerline Pain, MD;  Location: [place];  Service: Cardiovascular;  Laterality: N/A;  . CESAREAN SECTION     x2  . ESOPHAGEAL DILATION    . PERMANENT PACEMAKER INSERTION N/A 04/26/2014   Procedure: PERMANENT PACEMAKER INSERTION;  Surgeon: Evans Lance, MD;  [facility] CATH LAB;  Service: Cardiovascular;  Laterality: N/A;  . TEE WITHOUT CARDIOVERSION N/A 05/29/2014   Procedure: TRANSESOPHAGEAL ECHOCARDIOGRAM (TEE);  Surgeon: Fay Records, MD;  Location: [place];  Service: Cardiovascular;  Laterality: N/A;    There were no vitals filed for this visit.  Subjective Assessment - 06/02/17 1130    Subjective  I don't feel well.  I am in atrial fib.  I  didn't cancel because i didn't give 24 hours notice.  i am short of breath and very tired,.                                   PT Long Term Goals - 05/26/17 1155      PT LONG TERM GOAL #1   Title  She will be independent with all HEP issued    Baseline  cues needed,  minor    Time  4    Period  Weeks    Status  On-going      PT LONG TERM GOAL #2   Title  Berg score will improve to 50/56 or better to demo improved balance     Time  4    Period  Weeks    Status  Unable to assess      PT LONG TERM GOAL #3   Title  Pt willl demo awareness of good posture nad limit flexed and loaded postures    Baseline  ed began    Time  4    Period  Weeks    Status  On-going            Plan - 06/02/17 1132    Clinical Impression Statement  No PT roday due to Atrial FIB. Visit cancelled.    Consulted and Agree with Plan  of Care  Patient;Family member/caregiver    Family Member Consulted  Husband       Patient will benefit from skilled therapeutic intervention in order to improve the following deficits and impairments:     Visit Diagnosis: Age-related osteoporosis without current pathological fracture  Stiffness of both knees  Muscle weakness (generalized)  Abnormal posture  Unsteadiness on feet     Problem List Patient Active Problem List   Diagnosis Date Noted  . Status post dilation of esophageal narrowing 07/08/2016  . Visit for monitoring Tikosyn therapy 06/16/2016  . Pacemaker 08/01/2014  . Uncontrolled diabetes mellitus ([place]) 05/26/2014  . Acute renal failure superimposed on stage 3 chronic kidney disease ([place]) 05/26/2014  . Essential hypertension 05/26/2014  . Hyperkalemia 05/26/2014  . GERD (gastroesophageal reflux disease) 05/26/2014  . Hypothyroidism 05/26/2014  . Gout 04/26/2014  . Atrial fibrillation with rapid ventricular response ([place]) 04/26/2014  . Syncope 04/25/2014  . Atrial flutter with rapid ventricular response ([place])  04/25/2014  . Chronic kidney disease, stage III (moderate) ([place]) 10/05/2013  . Encounter for therapeutic drug monitoring 06/06/2013  . Essential hypertension, benign 03/25/2013  . Long term current use of anticoagulant therapy 03/25/2013  . Hyperlipidemia 03/25/2013  . Paroxysmal atrial fibrillation 09/03/2011  . Bradycardia 09/03/2011  . Allergic rhinitis 02/09/2011  . Diabetes mellitus ([place]) 02/09/2011  . Vitamin D deficiency 02/09/2011    HARRIS,Weronika  PTA 06/02/2017, 11:35 AM  [facility] [address] [place], [place], [address] Phone: [phone]   Fax:  [phone]  Name: LAEKYN RAYOS MRN: [id number] Date of Birth: February 10, 1935

## 2017-06-04 ENCOUNTER — Ambulatory Visit: Payer: Medicare Other

## 2017-06-04 DIAGNOSIS — M25661 Stiffness of right knee, not elsewhere classified: Secondary | ICD-10-CM | POA: Diagnosis not present

## 2017-06-04 DIAGNOSIS — M25662 Stiffness of left knee, not elsewhere classified: Secondary | ICD-10-CM

## 2017-06-04 DIAGNOSIS — R293 Abnormal posture: Secondary | ICD-10-CM | POA: Diagnosis not present

## 2017-06-04 DIAGNOSIS — R2681 Unsteadiness on feet: Secondary | ICD-10-CM

## 2017-06-04 DIAGNOSIS — M810 Age-related osteoporosis without current pathological fracture: Secondary | ICD-10-CM | POA: Diagnosis not present

## 2017-06-04 DIAGNOSIS — M6281 Muscle weakness (generalized): Secondary | ICD-10-CM | POA: Diagnosis not present

## 2017-06-04 NOTE — Therapy (Signed)
[place], [place], [address] Phone: [phone]   Fax:  [phone]  Physical Therapy Treatment  Patient Details  Name: Kathryn Richardson MRN: [id number] Date of Birth: May 20, 1934 Referring Provider: Minette Brine, MD   Encounter Date: 06/04/2017  PT End of Session - 06/04/17 1417    Visit Number  4    Number of Visits  8    Date for PT Re-Evaluation  06/19/17    PT Start Time  0215    PT Stop Time  0300    PT Time Calculation (min)  45 min    Activity Tolerance  Patient tolerated treatment well    Behavior During Therapy  [facility] for tasks assessed/performed       Past Medical History:  Diagnosis Date  . Allergic rhinitis   . Atrial fibrillation ([place])   . Chronic renal disease, stage IV ([place])   . Essential hypertension   . GERD (gastroesophageal reflux disease)   . Gout   . Hyperlipemia, mixed   . Insomnia   . Type 2 diabetes mellitus ([place])     Past Surgical History:  Procedure Laterality Date  . CARDIOVERSION N/A 05/29/2014   Procedure: CARDIOVERSION;  Surgeon: Fay Records, MD;  Location: [facility] ENDOSCOPY;  Service: Cardiovascular;  Laterality: N/A;  . CARDIOVERSION N/A 11/03/2014   Procedure: CARDIOVERSION;  Surgeon: Thayer Headings, MD;  Location: [place];  Service: Cardiovascular;  Laterality: N/A;  . CARDIOVERSION N/A 04/14/2016   Procedure: CARDIOVERSION;  Surgeon: Jerline Pain, MD;  Location: [place];  Service: Cardiovascular;  Laterality: N/A;  . CESAREAN SECTION     x2  . ESOPHAGEAL DILATION    . PERMANENT PACEMAKER INSERTION N/A 04/26/2014   Procedure: PERMANENT PACEMAKER INSERTION;  Surgeon: Evans Lance, MD;  Location: [facility] CATH LAB;  Service: Cardiovascular;  Laterality: N/A;  . TEE WITHOUT CARDIOVERSION N/A 05/29/2014   Procedure: TRANSESOPHAGEAL ECHOCARDIOGRAM (TEE);  Surgeon: Fay Records, MD;  Location: [place];  Service: Cardiovascular;  Laterality: N/A;    There were  no vitals filed for this visit.  Subjective Assessment - 06/04/17 1420    Subjective  Doing fine today . Atriall fib  chronic and MD treats with medication. Lasts 6-8 hours some times.     Pertinent History  PACEMAKER    Currently in Pain?  No/denies                      [facility] Adult PT Treatment/Exercise - 06/04/17 0001      Neuro Re-ed    Neuro Re-ed Details   balance training with stepping and issued for HEP and we had a discussion aboiut balance and systems related to these systems      Lumbar Exercises: Standing   Other Standing Lumbar Exercises  abduction x 15 RT /LT then 12 arm reach at wall with opp leg stance , cue to contract gluteals     Other Standing Lumbar Exercises  Forward and back heel toe RT /Lt x 15 then reach back with toe and for to foot flat x 12 then lateal step x 2 before full weight shift to stepping leg x12 RT/LT.       Knee/Hip Exercises: Aerobic   Nustep  % 5 min LE                  PT Long Term Goals - 05/26/17 1155      PT LONG TERM  GOAL #1   Title  She will be independent with all HEP issued    Baseline  cues needed,  minor    Time  4    Period  Weeks    Status  On-going      PT LONG TERM GOAL #2   Title  Berg score will improve to 50/56 or better to demo improved balance     Time  4    Period  Weeks    Status  Unable to assess      PT LONG TERM GOAL #3   Title  Pt willl demo awareness of good posture nad limit flexed and loaded postures    Baseline  ed began    Time  4    Period  Weeks    Status  On-going            Plan - 06/04/17 1421    Clinical Impression Statement  She was doing fine with activity.   reports balance improveing BERG test to assess    PT Treatment/Interventions  Therapeutic activities;Therapeutic exercise;Patient/family education;Balance training;Passive range of motion    PT Next Visit Plan  review and progress HEP, balance activity   BERG    PT Home Exercise Plan  decompression scap  retraction and cervical retraction and capital flexion.  balance in corner    Consulted and Agree with Plan of Care  Patient       Patient will benefit from skilled therapeutic intervention in order to improve the following deficits and impairments:  Decreased balance, Impaired flexibility, Difficulty walking, Decreased strength, Postural dysfunction  Visit Diagnosis: Age-related osteoporosis without current pathological fracture  Stiffness of both knees  Muscle weakness (generalized)  Abnormal posture  Unsteadiness on feet     Problem List Patient Active Problem List   Diagnosis Date Noted  . Status post dilation of esophageal narrowing 07/08/2016  . Visit for monitoring Tikosyn therapy 06/16/2016  . Pacemaker 08/01/2014  . Uncontrolled diabetes mellitus ([place]) 05/26/2014  . Acute renal failure superimposed on stage 3 chronic kidney disease ([place]) 05/26/2014  . Essential hypertension 05/26/2014  . Hyperkalemia 05/26/2014  . GERD (gastroesophageal reflux disease) 05/26/2014  . Hypothyroidism 05/26/2014  . Gout 04/26/2014  . Atrial fibrillation with rapid ventricular response ([place]) 04/26/2014  . Syncope 04/25/2014  . Atrial flutter with rapid ventricular response ([place]) 04/25/2014  . Chronic kidney disease, stage III (moderate) ([place]) 10/05/2013  . Encounter for therapeutic drug monitoring 06/06/2013  . Essential hypertension, benign 03/25/2013  . Long term current use of anticoagulant therapy 03/25/2013  . Hyperlipidemia 03/25/2013  . Paroxysmal atrial fibrillation 09/03/2011  . Bradycardia 09/03/2011  . Allergic rhinitis 02/09/2011  . Diabetes mellitus ([place]) 02/09/2011  . Vitamin D deficiency 02/09/2011    Darrel Hoover  PT 06/04/2017, 3:04 PM  [facility] [address] [place], [place], [address] Phone: [phone]   Fax:  [phone]  Name: Kathryn Richardson MRN: [id number] Date of Birth: [birth year]

## 2017-06-09 ENCOUNTER — Ambulatory Visit: Payer: Medicare Other | Admitting: Physical Therapy

## 2017-06-09 ENCOUNTER — Encounter: Payer: Self-pay | Admitting: Physical Therapy

## 2017-06-09 DIAGNOSIS — M6281 Muscle weakness (generalized): Secondary | ICD-10-CM | POA: Diagnosis not present

## 2017-06-09 DIAGNOSIS — M810 Age-related osteoporosis without current pathological fracture: Secondary | ICD-10-CM

## 2017-06-09 DIAGNOSIS — R293 Abnormal posture: Secondary | ICD-10-CM | POA: Diagnosis not present

## 2017-06-09 DIAGNOSIS — M25662 Stiffness of left knee, not elsewhere classified: Secondary | ICD-10-CM

## 2017-06-09 DIAGNOSIS — R2681 Unsteadiness on feet: Secondary | ICD-10-CM

## 2017-06-09 DIAGNOSIS — M25661 Stiffness of right knee, not elsewhere classified: Secondary | ICD-10-CM | POA: Diagnosis not present

## 2017-06-09 NOTE — Patient Instructions (Signed)
Calf Stretch    Place one leg forward, bent, other leg behind and straight. Lean forward keeping back heel flat. Hold _30___ seconds while counting out loud. Repeat with other leg forward. Repeat __1-3__ times. Do __1__ sessions per day.  http://gt2.exer.us/478   Copyright  VHI. All rights reserved.

## 2017-06-09 NOTE — Therapy (Signed)
[place], [place], [address] Phone: [phone]   Fax:  [phone]  Physical Therapy Treatment  Patient Details  Name: Kathryn Richardson MRN: [id number] Date of Birth: 1934/06/17 Referring Provider: Minette Brine, MD   Encounter Date: 06/09/2017  PT End of Session - 06/09/17 1316    Visit Number  5    Number of Visits  8    Date for PT Re-Evaluation  06/19/17    PT Start Time  7591    PT Stop Time  1145    PT Time Calculation (min)  40 min    Activity Tolerance  Patient tolerated treatment well    Behavior During Therapy  [facility] for tasks assessed/performed       Past Medical History:  Diagnosis Date  . Allergic rhinitis   . Atrial fibrillation ([place])   . Chronic renal disease, stage IV ([place])   . Essential hypertension   . GERD (gastroesophageal reflux disease)   . Gout   . Hyperlipemia, mixed   . Insomnia   . Type 2 diabetes mellitus ([place])     Past Surgical History:  Procedure Laterality Date  . CARDIOVERSION N/A 05/29/2014   Procedure: CARDIOVERSION;  Surgeon: Fay Records, MD;  Location: [facility] ENDOSCOPY;  Service: Cardiovascular;  Laterality: N/A;  . CARDIOVERSION N/A 11/03/2014   Procedure: CARDIOVERSION;  Surgeon: Thayer Headings, MD;  Location: [place];  Service: Cardiovascular;  Laterality: N/A;  . CARDIOVERSION N/A 04/14/2016   Procedure: CARDIOVERSION;  Surgeon: Jerline Pain, MD;  Location: [place];  Service: Cardiovascular;  Laterality: N/A;  . CESAREAN SECTION     x2  . ESOPHAGEAL DILATION    . PERMANENT PACEMAKER INSERTION N/A 04/26/2014   Procedure: PERMANENT PACEMAKER INSERTION;  Surgeon: Evans Lance, MD;  Location: [facility] CATH LAB;  Service: Cardiovascular;  Laterality: N/A;  . TEE WITHOUT CARDIOVERSION N/A 05/29/2014   Procedure: TRANSESOPHAGEAL ECHOCARDIOGRAM (TEE);  Surgeon: Fay Records, MD;  Location: [place];  Service: Cardiovascular;  Laterality: N/A;    There were  no vitals filed for this visit.  Subjective Assessment - 06/09/17 1105    Subjective  I think my balance is a little better.    Currently in Pain?  No/denies    Multiple Pain Sites  No                      [facility] Adult PT Treatment/Exercise - 06/09/17 0001      Berg Balance Test   Sit to Stand  Able to stand without using hands and stabilize independently    Standing Unsupported  Able to stand safely 2 minutes    Sitting with Back Unsupported but Feet Supported on Floor or Stool  Able to sit safely and securely 2 minutes    Stand to Sit  Sits safely with minimal use of hands    Transfers  Able to transfer safely, minor use of hands    Standing Unsupported with Eyes Closed  Able to stand 10 seconds safely    Standing Ubsupported with Feet Together  Able to place feet together independently and stand 1 minute safely    From Standing, Reach Forward with Outstretched Arm  Can reach forward >12 cm safely (5")    From Standing Position, Pick up Object from Floor  Able to pick up shoe safely and easily    From Standing Position, Turn to Look Behind Over each Shoulder  Looks behind  from both sides and weight shifts well    Turn 360 Degrees  Able to turn 360 degrees safely one side only in 4 seconds or less    Standing Unsupported, Alternately Place Feet on Step/Stool  Able to stand independently and safely and complete 8 steps in 20 seconds    Standing Unsupported, One Foot in Front  Needs help to step but can hold 15 seconds    Standing on One Leg  Tries to lift leg/unable to hold 3 seconds but remains standing independently    Total Score  48      High Level Balance   High Level Balance Comments  balance exercises continued in gym with and without chair and in corner.  standing on pillow exercises were too challanging.  Todat exercises included head / arm  movements,  eyes closed,  weight shifting,  tandem and single leg.  Close SBA and CGA used off and on during session.              PT Education - 06/09/17 1311    Education provided  Yes    Education Details  HEP    Methods  Explanation;Demonstration;Verbal cues;Handout    Comprehension  Verbalized understanding;Returned demonstration          PT Long Term Goals - 06/09/17 1321      PT LONG TERM GOAL #1   Title  She will be independent with all HEP issued    Baseline  she is doing at home with the assist of the instruction sheets.  She has some memorized.     Time  4    Period  Weeks    Status  On-going      PT LONG TERM GOAL #2   Title  Berg score will improve to 50/56 or better to demo improved balance     Baseline  48/56    Time  4    Period  Weeks    Status  Partially Met      PT LONG TERM GOAL #3   Title  Pt willl demo awareness of good posture nad limit flexed and loaded postures    Time  4    Period  Weeks    Status  Unable to assess            Plan - 06/09/17 1316    Clinical Impression Statement  BERG improved from 44 to 48/56 today.  balance improving.  LTG#2 partially met.  No pain with today's session.    PT Next Visit Plan  review and progress HEP, balance activity  .  PT to discuss the results of BERG with patient.      PT Home Exercise Plan  decompression scap retraction and cervical retraction and capital flexion.  balance in corner,  calf stretch    Consulted and Agree with Plan of Care  Patient       Patient will benefit from skilled therapeutic intervention in order to improve the following deficits and impairments:     Visit Diagnosis: Age-related osteoporosis without current pathological fracture  Stiffness of both knees  Muscle weakness (generalized)  Abnormal posture  Unsteadiness on feet     Problem List Patient Active Problem List   Diagnosis Date Noted  . Status post dilation of esophageal narrowing 07/08/2016  . Visit for monitoring Tikosyn therapy 06/16/2016  . Pacemaker 08/01/2014  . Uncontrolled diabetes mellitus ([place])  05/26/2014  . Acute renal failure superimposed on stage 3 chronic kidney  disease ([place]) 05/26/2014  . Essential hypertension 05/26/2014  . Hyperkalemia 05/26/2014  . GERD (gastroesophageal reflux disease) 05/26/2014  . Hypothyroidism 05/26/2014  . Gout 04/26/2014  . Atrial fibrillation with rapid ventricular response ([place]) 04/26/2014  . Syncope 04/25/2014  . Atrial flutter with rapid ventricular response ([place]) 04/25/2014  . Chronic kidney disease, stage III (moderate) ([place]) 10/05/2013  . Encounter for therapeutic drug monitoring 06/06/2013  . Essential hypertension, benign 03/25/2013  . Long term current use of anticoagulant therapy 03/25/2013  . Hyperlipidemia 03/25/2013  . Paroxysmal atrial fibrillation 09/03/2011  . Bradycardia 09/03/2011  . Allergic rhinitis 02/09/2011  . Diabetes mellitus ([place]) 02/09/2011  . Vitamin D deficiency 02/09/2011    Taje Tondreau,Joyleen  PTA 06/09/2017, 1:24 PM  [facility] [address] [place], [place], [address] Phone: [phone]   Fax:  [phone]  Name: Kathryn Richardson MRN: [id number] Date of Birth: 1934/06/15

## 2017-06-11 ENCOUNTER — Ambulatory Visit: Payer: Medicare Other | Admitting: Physical Therapy

## 2017-06-11 ENCOUNTER — Encounter: Payer: Self-pay | Admitting: Physical Therapy

## 2017-06-11 DIAGNOSIS — R2681 Unsteadiness on feet: Secondary | ICD-10-CM

## 2017-06-11 DIAGNOSIS — M25661 Stiffness of right knee, not elsewhere classified: Secondary | ICD-10-CM | POA: Diagnosis not present

## 2017-06-11 DIAGNOSIS — M25662 Stiffness of left knee, not elsewhere classified: Secondary | ICD-10-CM

## 2017-06-11 DIAGNOSIS — M6281 Muscle weakness (generalized): Secondary | ICD-10-CM | POA: Diagnosis not present

## 2017-06-11 DIAGNOSIS — R293 Abnormal posture: Secondary | ICD-10-CM

## 2017-06-11 DIAGNOSIS — M810 Age-related osteoporosis without current pathological fracture: Secondary | ICD-10-CM | POA: Diagnosis not present

## 2017-06-11 NOTE — Therapy (Signed)
[place], [place], [address] Phone: [phone]   Fax:  [phone]  Physical Therapy Treatment  Patient Details  Name: Kathryn Richardson MRN: [id number] Date of Birth: Jun 21, 1934 Referring Provider: Minette Brine, MD   Encounter Date: 06/11/2017  PT End of Session - 06/11/17 1620    Visit Number  6    Number of Visits  8    Date for PT Re-Evaluation  06/19/17    PT Start Time  1330    PT Stop Time  1414    PT Time Calculation (min)  44 min    Activity Tolerance  Patient tolerated treatment well    Behavior During Therapy  [facility] for tasks assessed/performed       Past Medical History:  Diagnosis Date  . Allergic rhinitis   . Atrial fibrillation ([place])   . Chronic renal disease, stage IV ([place])   . Essential hypertension   . GERD (gastroesophageal reflux disease)   . Gout   . Hyperlipemia, mixed   . Insomnia   . Type 2 diabetes mellitus ([place])     Past Surgical History:  Procedure Laterality Date  . CARDIOVERSION N/A 05/29/2014   Procedure: CARDIOVERSION;  Surgeon: Fay Records, MD;  Location: [facility] ENDOSCOPY;  Service: Cardiovascular;  Laterality: N/A;  . CARDIOVERSION N/A 11/03/2014   Procedure: CARDIOVERSION;  Surgeon: Thayer Headings, MD;  Location: [place];  Service: Cardiovascular;  Laterality: N/A;  . CARDIOVERSION N/A 04/14/2016   Procedure: CARDIOVERSION;  Surgeon: Jerline Pain, MD;  Location: [place];  Service: Cardiovascular;  Laterality: N/A;  . CESAREAN SECTION     x2  . ESOPHAGEAL DILATION    . PERMANENT PACEMAKER INSERTION N/A 04/26/2014   Procedure: PERMANENT PACEMAKER INSERTION;  Surgeon: Evans Lance, MD;  Location: [facility] CATH LAB;  Service: Cardiovascular;  Laterality: N/A;  . TEE WITHOUT CARDIOVERSION N/A 05/29/2014   Procedure: TRANSESOPHAGEAL ECHOCARDIOGRAM (TEE);  Surgeon: Fay Records, MD;  Location: [place];  Service: Cardiovascular;  Laterality: N/A;    There were  no vitals filed for this visit.  Subjective Assessment - 06/11/17 1615    Subjective  I have been doing the exercises and it is helping.      Currently in Pain?  No/denies                      [facility] Adult PT Treatment/Exercise - 06/11/17 0001      High Level Balance   High Level Balance Comments  balance stepping over half bolster,  side to side with and without hands,  forward and reverse with hand,  stepping over pole forward and reverse with no hands 5  x each.  SBA,  CGA at times.       Lumbar Exercises: Stretches   Active Hamstring Stretch  3 reps;30 seconds HEP each      Lumbar Exercises: Supine   Heel Slides  10 reps small steps out and back,  monitored low back,  abdominal    Bridge  10 reps    Other Supine Lumbar Exercises  # inch bent knee lift /hold 5 seconds with abdominal bracing      Lumbar Exercises: Sidelying   Clam  10 reps HEP,  cued for correct technique.      Knee/Hip Exercises: Aerobic   Nustep  8 minutes  L3      Knee/Hip Exercises: Standing   Lateral Step Up  10 reps  4 inch each    Forward Step Up  10 reps 4 inch    SLS  3 X 30 seconds each holding onto back of chair.      Knee/Hip Exercises: Seated   Hamstring Curl  2 sets;10 reps green band    Sit to [employer]  10 reps             PT Education - 06/11/17 1352    Education provided  Yes    Education Details  HEP    Person(s) Educated  Patient    Methods  Explanation;Tactile cues;Demonstration;Verbal cues;Handout    Comprehension  Verbalized understanding;Returned demonstration          PT Long Term Goals - 06/09/17 1321      PT LONG TERM GOAL #1   Title  She will be independent with all HEP issued    Baseline  she is doing at home with the assist of the instruction sheets.  She has some memorized.     Time  4    Period  Weeks    Status  On-going      PT LONG TERM GOAL #2   Title  Berg score will improve to 50/56 or better to demo improved balance     Baseline  48/56     Time  4    Period  Weeks    Status  Partially Met      PT LONG TERM GOAL #3   Title  Pt willl demo awareness of good posture nad limit flexed and loaded postures    Time  4    Period  Weeks    Status  Unable to assess            Plan - 06/11/17 1621    Clinical Impression Statement  Progredded her HEP today.  Patient is motivated and compliant with her HEP.  No pain today.  Able to SLS 30 Seconds with hands on back of chair    PT Next Visit Plan  review and progress HEP, balance activity  .  PT to discuss the results of BERG with patient.      PT Home Exercise Plan  decompression scap retraction and cervical retraction and capital flexion.  balance in corner,  calf stretch,  HS stretch,  Bridge, on side clam    Consulted and Agree with Plan of Care  Patient       Patient will benefit from skilled therapeutic intervention in order to improve the following deficits and impairments:     Visit Diagnosis: Age-related osteoporosis without current pathological fracture  Stiffness of both knees  Muscle weakness (generalized)  Abnormal posture  Unsteadiness on feet     Problem List Patient Active Problem List   Diagnosis Date Noted  . Status post dilation of esophageal narrowing 07/08/2016  . Visit for monitoring Tikosyn therapy 06/16/2016  . Pacemaker 08/01/2014  . Uncontrolled diabetes mellitus ([place]) 05/26/2014  . Acute renal failure superimposed on stage 3 chronic kidney disease ([place]) 05/26/2014  . Essential hypertension 05/26/2014  . Hyperkalemia 05/26/2014  . GERD (gastroesophageal reflux disease) 05/26/2014  . Hypothyroidism 05/26/2014  . Gout 04/26/2014  . Atrial fibrillation with rapid ventricular response ([place]) 04/26/2014  . Syncope 04/25/2014  . Atrial flutter with rapid ventricular response ([place]) 04/25/2014  . Chronic kidney disease, stage III (moderate) ([place]) 10/05/2013  . Encounter for therapeutic drug monitoring 06/06/2013  . Essential hypertension,  benign 03/25/2013  . Long term current use of anticoagulant  therapy 03/25/2013  . Hyperlipidemia 03/25/2013  . Paroxysmal atrial fibrillation 09/03/2011  . Bradycardia 09/03/2011  . Allergic rhinitis 02/09/2011  . Diabetes mellitus ([place]) 02/09/2011  . Vitamin D deficiency 02/09/2011    Dyland Panuco,Mckynlee PTA 06/11/2017, 4:24 PM  [facility] [address] [place], [place], [address] Phone: [phone]   Fax:  [phone]  Name: ZOEANN MOL MRN: [id number] Date of Birth: May 23, 1934

## 2017-06-11 NOTE — Patient Instructions (Addendum)
Abduction: Clam (Eccentric) - Side-Lying    Lie on side with knees bent. Lift top knee, keeping feet together. Keep trunk steady. Slowly lower for 3-5 seconds. __10_ reps per set, _1 to 2__ sets per day.   http://ecce.exer.us/65   Copyright  VHI. All rights reserved.  Bridge    Lie back, legs bent. Inhale, pressing hips up. Keeping ribs in, lengthen lower back. Exhale, rolling down along spine from top. Repeat _10___ times. Do __1 to 2__ sessions per day.  http://pm.exer.us/55   Copyright  VHI. All rights reserved.  Hamstring Stretch    With other leg bent, foot flat, grasp right leg and slowly try to straighten knee. Hold __30__ seconds. Repeat __3__ times. Do _1___ sessions per day.  http://gt2.exer.us/280   Copyright  VHI. All rights reserved.

## 2017-06-14 ENCOUNTER — Other Ambulatory Visit: Payer: Self-pay | Admitting: Physician Assistant

## 2017-06-16 ENCOUNTER — Ambulatory Visit: Payer: Medicare Other | Attending: Family Medicine

## 2017-06-16 DIAGNOSIS — M810 Age-related osteoporosis without current pathological fracture: Secondary | ICD-10-CM | POA: Diagnosis not present

## 2017-06-16 DIAGNOSIS — M6281 Muscle weakness (generalized): Secondary | ICD-10-CM | POA: Diagnosis not present

## 2017-06-16 DIAGNOSIS — M25662 Stiffness of left knee, not elsewhere classified: Secondary | ICD-10-CM | POA: Insufficient documentation

## 2017-06-16 DIAGNOSIS — R2681 Unsteadiness on feet: Secondary | ICD-10-CM | POA: Diagnosis not present

## 2017-06-16 DIAGNOSIS — R293 Abnormal posture: Secondary | ICD-10-CM | POA: Diagnosis not present

## 2017-06-16 DIAGNOSIS — M25661 Stiffness of right knee, not elsewhere classified: Secondary | ICD-10-CM

## 2017-06-16 NOTE — Therapy (Signed)
[place], [place], [address] Phone: [phone]   Fax:  [phone]  Physical Therapy Treatment  Patient Details  Name: Kathryn Richardson MRN: [id number] Date of Birth: November 15, 1934 Referring Provider: Minette Brine, MD   Encounter Date: 06/16/2017  PT End of Session - 06/16/17 1103    Visit Number  7    Number of Visits  8    Date for PT Re-Evaluation  06/19/17    Authorization Type  Medicare    PT Start Time  1100    PT Stop Time  1145    PT Time Calculation (min)  45 min    Activity Tolerance  Patient tolerated treatment well    Behavior During Therapy  [facility] for tasks assessed/performed       Past Medical History:  Diagnosis Date  . Allergic rhinitis   . Atrial fibrillation ([place])   . Chronic renal disease, stage IV ([place])   . Essential hypertension   . GERD (gastroesophageal reflux disease)   . Gout   . Hyperlipemia, mixed   . Insomnia   . Type 2 diabetes mellitus ([place])     Past Surgical History:  Procedure Laterality Date  . CARDIOVERSION N/A 05/29/2014   Procedure: CARDIOVERSION;  Surgeon: Fay Records, MD;  Location: [facility] ENDOSCOPY;  Service: Cardiovascular;  Laterality: N/A;  . CARDIOVERSION N/A 11/03/2014   Procedure: CARDIOVERSION;  Surgeon: Thayer Headings, MD;  Location: [place];  Service: Cardiovascular;  Laterality: N/A;  . CARDIOVERSION N/A 04/14/2016   Procedure: CARDIOVERSION;  Surgeon: Jerline Pain, MD;  Location: [place];  Service: Cardiovascular;  Laterality: N/A;  . CESAREAN SECTION     x2  . ESOPHAGEAL DILATION    . PERMANENT PACEMAKER INSERTION N/A 04/26/2014   Procedure: PERMANENT PACEMAKER INSERTION;  Surgeon: Evans Lance, MD;  Location: [facility] CATH LAB;  Service: Cardiovascular;  Laterality: N/A;  . TEE WITHOUT CARDIOVERSION N/A 05/29/2014   Procedure: TRANSESOPHAGEAL ECHOCARDIOGRAM (TEE);  Surgeon: Fay Records, MD;  Location: [place];  Service: Cardiovascular;   Laterality: N/A;    There were no vitals filed for this visit.      [facility] PT Assessment - 06/16/17 0001      Berg Balance Test   Sit to Stand  Able to stand without using hands and stabilize independently    Standing Unsupported  Able to stand safely 2 minutes    Sitting with Back Unsupported but Feet Supported on Floor or Stool  Able to sit safely and securely 2 minutes    Stand to Sit  Sits safely with minimal use of hands    Transfers  Able to transfer safely, minor use of hands    Standing Unsupported with Eyes Closed  Able to stand 10 seconds safely    Standing Ubsupported with Feet Together  Able to place feet together independently and stand 1 minute safely    From Standing, Reach Forward with Outstretched Arm  Can reach forward >12 cm safely (5")    From Standing Position, Pick up Object from Floor  Able to pick up shoe safely and easily    From Standing Position, Turn to Look Behind Over each Shoulder  Looks behind from both sides and weight shifts well    Turn 360 Degrees  Able to turn 360 degrees safely in 4 seconds or less    Standing Unsupported, Alternately Place Feet on Step/Stool  Able to complete 4 steps without aid or supervision  Standing Unsupported, One Foot in [place] to take small step independently and hold 30 seconds    Standing on One Leg  Tries to lift leg/unable to hold 3 seconds but remains standing independently    Total Score  48    Berg comment:  diiscussed improvement with pt                  [facility] Adult PT Treatment/Exercise - 06/16/17 0001      Neuro Re-ed    Neuro Re-ed Details   with close CG of PT  then with lateral balance on tilt board  with CGA  with eyes closed for brief time.  Then single leg stand RT/Lt with max 5 sec after setting balance with UE support.  x 3        Knee/Hip Exercises: Aerobic   Nustep  6 minutes  L3      Knee/Hip Exercises: Seated   Hamstring Curl  2 sets;10 reps    Hamstring Limitations  green  band    Sit to [employer]  10 reps             PT Education - 06/16/17 1150    Education provided  Yes    Education Details  BERG improvement and score implications.   options for balance treatments.    at [address] [place] still indicates need for [facility] for max decr risk of fall    Person(s) Educated  Patient    Methods  Explanation    Comprehension  Verbalized understanding          PT Long Term Goals - 06/16/17 1151      PT LONG TERM GOAL #1   Title  She will be independent with all HEP issued    Status  Achieved      PT LONG TERM GOAL #2   Title  Berg score will improve to 50/56 or better to demo improved balance     Baseline  49/56 so progressing    Status  Partially Met      PT LONG TERM GOAL #3   Title  Pt willl demo awareness of good posture nad limit flexed and loaded postures    Baseline  ed began    Status  On-going            Plan - 06/16/17 1148    Clinical Impression Statement  Her BERG score was improved .   She will be ready for discharge next visit . Offered her to have MD give order for balance treatment at Neuro [facility] if she feels she wants this.     PT Treatment/Interventions  Therapeutic activities;Therapeutic exercise;Patient/family education;Balance training;Passive range of motion    PT Next Visit Plan  review and progress HEP, balance activity  .  Marland Kitchen  Discharge next visit    PT Home Exercise Plan  decompression scap retraction and cervical retraction and capital flexion.  balance in corner,  calf stretch,  HS stretch,  Bridge, on side clam    Consulted and Agree with Plan of Care  Patient       Patient will benefit from skilled therapeutic intervention in order to improve the following deficits and impairments:  Decreased balance, Impaired flexibility, Difficulty walking, Decreased strength, Postural dysfunction  Visit Diagnosis: Age-related osteoporosis without current pathological fracture  Stiffness of both knees  Muscle weakness  (generalized)  Unsteadiness on feet  Abnormal posture     Problem List Patient Active Problem List  Diagnosis Date Noted  . Status post dilation of esophageal narrowing 07/08/2016  . Visit for monitoring Tikosyn therapy 06/16/2016  . Pacemaker 08/01/2014  . Uncontrolled diabetes mellitus ([place]) 05/26/2014  . Acute renal failure superimposed on stage 3 chronic kidney disease ([place]) 05/26/2014  . Essential hypertension 05/26/2014  . Hyperkalemia 05/26/2014  . GERD (gastroesophageal reflux disease) 05/26/2014  . Hypothyroidism 05/26/2014  . Gout 04/26/2014  . Atrial fibrillation with rapid ventricular response ([place]) 04/26/2014  . Syncope 04/25/2014  . Atrial flutter with rapid ventricular response ([place]) 04/25/2014  . Chronic kidney disease, stage III (moderate) ([place]) 10/05/2013  . Encounter for therapeutic drug monitoring 06/06/2013  . Essential hypertension, benign 03/25/2013  . Long term current use of anticoagulant therapy 03/25/2013  . Hyperlipidemia 03/25/2013  . Paroxysmal atrial fibrillation 09/03/2011  . Bradycardia 09/03/2011  . Allergic rhinitis 02/09/2011  . Diabetes mellitus ([place]) 02/09/2011  . Vitamin D deficiency 02/09/2011    Darrel Hoover  PT 06/16/2017, 11:53 AM  [facility] [address] [place], [place], [address] Phone: [phone]   Fax:  [phone]  Name: Kathryn Richardson MRN: [id number] Date of Birth: [birth year]

## 2017-06-18 ENCOUNTER — Ambulatory Visit: Payer: Medicare Other

## 2017-06-18 DIAGNOSIS — R2681 Unsteadiness on feet: Secondary | ICD-10-CM

## 2017-06-18 DIAGNOSIS — M6281 Muscle weakness (generalized): Secondary | ICD-10-CM | POA: Diagnosis not present

## 2017-06-18 DIAGNOSIS — M25661 Stiffness of right knee, not elsewhere classified: Secondary | ICD-10-CM | POA: Diagnosis not present

## 2017-06-18 DIAGNOSIS — R293 Abnormal posture: Secondary | ICD-10-CM | POA: Diagnosis not present

## 2017-06-18 DIAGNOSIS — M25662 Stiffness of left knee, not elsewhere classified: Secondary | ICD-10-CM | POA: Diagnosis not present

## 2017-06-18 DIAGNOSIS — M810 Age-related osteoporosis without current pathological fracture: Secondary | ICD-10-CM

## 2017-06-18 NOTE — Patient Instructions (Signed)
Pelvic Tilt: Posterior - Legs Bent (Supine)    Tighten stomach and flatten back by rolling pelvis down. Hold _5___ seconds. Relax. Repeat ___12-20_ times per set. Do __1-2__ sets per session. Do _1___ sessions per day.  http://orth.exer.us/203   Copyright  VHI. All rights reserved.  FLEXION: Supine (Active)    Lie on back, legs straight. Draw right knee toward chest. Use ___ lbs. Complete __1-2_ sets of ___10-20 repetitions. Perform _1__ sessions per day.  Copyright  VHI. All rights reserved.

## 2017-06-18 NOTE — Therapy (Signed)
[place], [place], [address] Phone: [phone]   Fax:  [phone]  Physical Therapy Treatment/Discharge  Patient Details  Name: Kathryn Richardson MRN: [id number] Date of Birth: 02-16-1935 Referring Provider: Minette Brine, MD   Encounter Date: 06/18/2017  PT End of Session - 06/18/17 1143    Visit Number  8    Number of Visits  8    Authorization Type  Medicare    PT Start Time  1100    PT Stop Time  1140    PT Time Calculation (min)  40 min    Activity Tolerance  Patient tolerated treatment well    Behavior During Therapy  [facility] for tasks assessed/performed       Past Medical History:  Diagnosis Date  . Allergic rhinitis   . Atrial fibrillation ([place])   . Chronic renal disease, stage IV ([place])   . Essential hypertension   . GERD (gastroesophageal reflux disease)   . Gout   . Hyperlipemia, mixed   . Insomnia   . Type 2 diabetes mellitus ([place])     Past Surgical History:  Procedure Laterality Date  . CARDIOVERSION N/A 05/29/2014   Procedure: CARDIOVERSION;  Surgeon: Fay Records, MD;  Location: [facility] ENDOSCOPY;  Service: Cardiovascular;  Laterality: N/A;  . CARDIOVERSION N/A 11/03/2014   Procedure: CARDIOVERSION;  Surgeon: Thayer Headings, MD;  Location: [place];  Service: Cardiovascular;  Laterality: N/A;  . CARDIOVERSION N/A 04/14/2016   Procedure: CARDIOVERSION;  Surgeon: Jerline Pain, MD;  Location: [place];  Service: Cardiovascular;  Laterality: N/A;  . CESAREAN SECTION     x2  . ESOPHAGEAL DILATION    . PERMANENT PACEMAKER INSERTION N/A 04/26/2014   Procedure: PERMANENT PACEMAKER INSERTION;  Surgeon: Evans Lance, MD;  Location: [facility] CATH LAB;  Service: Cardiovascular;  Laterality: N/A;  . TEE WITHOUT CARDIOVERSION N/A 05/29/2014   Procedure: TRANSESOPHAGEAL ECHOCARDIOGRAM (TEE);  Surgeon: Fay Records, MD;  Location: [place];  Service: Cardiovascular;  Laterality: N/A;    There  were no vitals filed for this visit.  Subjective Assessment - 06/18/17 1100    Currently in Pain?  No/denies         [facility] PT Assessment - 06/18/17 0001      Observation/Other Assessments   Focus on Therapeutic Outcomes (FOTO)   33% limited      AROM   Right Knee Extension  -15    Right Knee Flexion  122    Left Knee Extension  -15    Left Knee Flexion  122      Strength   Overall Strength Comments  all hip and thigh strength 5-/5 to 5/5 except hip ext 4+/5                     [facility] Adult PT Treatment/Exercise - 06/18/17 0001      Lumbar Exercises: Supine   Other Supine Lumbar Exercises  ball squeeze and band clam x 12 rpes issued HEP. then bent knee raise x 12 and issued for HEp                  PT Long Term Goals - 06/18/17 1144      PT LONG TERM GOAL #1   Title  She will be independent with all HEP issued    Status  Achieved      PT LONG TERM GOAL #2   Title  Berg score will improve  to 50/56 or better to demo improved balance     Baseline  49/56 so progressing    Status  Partially Met      PT LONG TERM GOAL #3   Title  Pt willl demo awareness of good posture nad limit flexed and loaded postures    Status  On-going            Plan - 06/18/17 1111    Clinical Impression Statement  Improved strength and BERG score she is ready for discharge. She will contact MD if she wants to got to Neuro [facility] for balance training. FOTO improved as predeicted by FOTO    PT Treatment/Interventions  Therapeutic activities;Therapeutic exercise;Patient/family education;Balance training;Passive range of motion    PT Next Visit Plan  Discharge    PT Home Exercise Plan  decompression scap retraction and cervical retraction and capital flexion.  balance in corner,  calf stretch,  HS stretch,  Bridge, on side clam ball squeeze and clam with band supine     Consulted and Agree with Plan of Care  Patient       Patient will benefit from skilled therapeutic  intervention in order to improve the following deficits and impairments:  Decreased balance, Impaired flexibility, Difficulty walking, Decreased strength, Postural dysfunction  Visit Diagnosis: Age-related osteoporosis without current pathological fracture  Stiffness of both knees  Muscle weakness (generalized)  Unsteadiness on feet     Problem List Patient Active Problem List   Diagnosis Date Noted  . Status post dilation of esophageal narrowing 07/08/2016  . Visit for monitoring Tikosyn therapy 06/16/2016  . Pacemaker 08/01/2014  . Uncontrolled diabetes mellitus ([place]) 05/26/2014  . Acute renal failure superimposed on stage 3 chronic kidney disease ([place]) 05/26/2014  . Essential hypertension 05/26/2014  . Hyperkalemia 05/26/2014  . GERD (gastroesophageal reflux disease) 05/26/2014  . Hypothyroidism 05/26/2014  . Gout 04/26/2014  . Atrial fibrillation with rapid ventricular response ([place]) 04/26/2014  . Syncope 04/25/2014  . Atrial flutter with rapid ventricular response ([place]) 04/25/2014  . Chronic kidney disease, stage III (moderate) ([place]) 10/05/2013  . Encounter for therapeutic drug monitoring 06/06/2013  . Essential hypertension, benign 03/25/2013  . Long term current use of anticoagulant therapy 03/25/2013  . Hyperlipidemia 03/25/2013  . Paroxysmal atrial fibrillation 09/03/2011  . Bradycardia 09/03/2011  . Allergic rhinitis 02/09/2011  . Diabetes mellitus ([place]) 02/09/2011  . Vitamin D deficiency 02/09/2011    Darrel Hoover 06/18/2017, 11:45 AM  [facility] [address] [place], [place], [address] Phone: [phone]   Fax:  [phone]  Name: Kathryn Richardson MRN: [id number] Date of Birth: 04/14/1935  PHYSICAL THERAPY DISCHARGE SUMMARY  Visits from Start of Care: 8  Current functional level related to goals / functional outcomes: See above   Remaining deficits: See above   Education /  Equipment: HEP Plan: Patient agrees to discharge.  Patient goals were met. Patient is being discharged due to meeting the stated rehab goals.  ?????   And pt pleased with progress

## 2017-06-19 ENCOUNTER — Other Ambulatory Visit: Payer: Self-pay | Admitting: Physician Assistant

## 2017-06-22 LAB — CUP PACEART REMOTE DEVICE CHECK
Battery Remaining Longevity: 84 mo
Brady Statistic AP VP Percent: 0.34 %
Brady Statistic AP VS Percent: 94.31 %
Brady Statistic AS VS Percent: 5.09 %
Brady Statistic RV Percent Paced: 0.61 %
Implantable Lead Implant Date: 20151217
Implantable Lead Location: 753859
Implantable Lead Model: 5076
Lead Channel Impedance Value: 361 Ohm
Lead Channel Impedance Value: 437 Ohm
Lead Channel Impedance Value: 475 Ohm
Lead Channel Impedance Value: 570 Ohm
Lead Channel Pacing Threshold Amplitude: 0.75 V
Lead Channel Sensing Intrinsic Amplitude: 0.75 mV
Lead Channel Sensing Intrinsic Amplitude: 8.625 mV
Lead Channel Setting Pacing Amplitude: 2.5 V
Lead Channel Setting Sensing Sensitivity: 0.9 mV
MDC IDC LEAD IMPLANT DT: 20151217
MDC IDC LEAD LOCATION: 753860
MDC IDC MSMT BATTERY VOLTAGE: 3.01 V
MDC IDC MSMT LEADCHNL RA PACING THRESHOLD AMPLITUDE: 0.875 V
MDC IDC MSMT LEADCHNL RA PACING THRESHOLD PULSEWIDTH: 0.4 ms
MDC IDC MSMT LEADCHNL RA SENSING INTR AMPL: 0.75 mV
MDC IDC MSMT LEADCHNL RV PACING THRESHOLD PULSEWIDTH: 0.4 ms
MDC IDC MSMT LEADCHNL RV SENSING INTR AMPL: 8.625 mV
MDC IDC PG IMPLANT DT: 20151217
MDC IDC SESS DTM: 20190110200416
MDC IDC SET LEADCHNL RA PACING AMPLITUDE: 2 V
MDC IDC SET LEADCHNL RV PACING PULSEWIDTH: 0.4 ms
MDC IDC STAT BRADY AS VP PERCENT: 0.26 %
MDC IDC STAT BRADY RA PERCENT PACED: 93.64 %

## 2017-07-13 ENCOUNTER — Other Ambulatory Visit: Payer: Self-pay | Admitting: Physician Assistant

## 2017-07-14 DIAGNOSIS — Z808 Family history of malignant neoplasm of other organs or systems: Secondary | ICD-10-CM | POA: Diagnosis not present

## 2017-07-14 DIAGNOSIS — Z85828 Personal history of other malignant neoplasm of skin: Secondary | ICD-10-CM | POA: Diagnosis not present

## 2017-07-14 DIAGNOSIS — L570 Actinic keratosis: Secondary | ICD-10-CM | POA: Diagnosis not present

## 2017-07-14 DIAGNOSIS — C44519 Basal cell carcinoma of skin of other part of trunk: Secondary | ICD-10-CM | POA: Diagnosis not present

## 2017-07-14 DIAGNOSIS — D2261 Melanocytic nevi of right upper limb, including shoulder: Secondary | ICD-10-CM | POA: Diagnosis not present

## 2017-07-14 DIAGNOSIS — D485 Neoplasm of uncertain behavior of skin: Secondary | ICD-10-CM | POA: Diagnosis not present

## 2017-07-23 ENCOUNTER — Other Ambulatory Visit: Payer: Self-pay | Admitting: Physician Assistant

## 2017-07-27 DIAGNOSIS — N183 Chronic kidney disease, stage 3 (moderate): Secondary | ICD-10-CM | POA: Diagnosis not present

## 2017-07-27 DIAGNOSIS — R809 Proteinuria, unspecified: Secondary | ICD-10-CM | POA: Diagnosis not present

## 2017-07-27 DIAGNOSIS — G4700 Insomnia, unspecified: Secondary | ICD-10-CM | POA: Diagnosis not present

## 2017-07-27 DIAGNOSIS — Z794 Long term (current) use of insulin: Secondary | ICD-10-CM | POA: Diagnosis not present

## 2017-07-27 DIAGNOSIS — D631 Anemia in chronic kidney disease: Secondary | ICD-10-CM | POA: Diagnosis not present

## 2017-07-27 DIAGNOSIS — E1129 Type 2 diabetes mellitus with other diabetic kidney complication: Secondary | ICD-10-CM | POA: Diagnosis not present

## 2017-07-27 DIAGNOSIS — E1122 Type 2 diabetes mellitus with diabetic chronic kidney disease: Secondary | ICD-10-CM | POA: Diagnosis not present

## 2017-07-27 DIAGNOSIS — I129 Hypertensive chronic kidney disease with stage 1 through stage 4 chronic kidney disease, or unspecified chronic kidney disease: Secondary | ICD-10-CM | POA: Diagnosis not present

## 2017-08-11 DIAGNOSIS — E7800 Pure hypercholesterolemia, unspecified: Secondary | ICD-10-CM | POA: Diagnosis not present

## 2017-08-11 DIAGNOSIS — R609 Edema, unspecified: Secondary | ICD-10-CM | POA: Diagnosis not present

## 2017-08-11 DIAGNOSIS — Z794 Long term (current) use of insulin: Secondary | ICD-10-CM | POA: Diagnosis not present

## 2017-08-11 DIAGNOSIS — I4891 Unspecified atrial fibrillation: Secondary | ICD-10-CM | POA: Diagnosis not present

## 2017-08-11 DIAGNOSIS — E1122 Type 2 diabetes mellitus with diabetic chronic kidney disease: Secondary | ICD-10-CM | POA: Diagnosis not present

## 2017-08-11 DIAGNOSIS — K760 Fatty (change of) liver, not elsewhere classified: Secondary | ICD-10-CM | POA: Diagnosis not present

## 2017-08-11 DIAGNOSIS — E039 Hypothyroidism, unspecified: Secondary | ICD-10-CM | POA: Diagnosis not present

## 2017-08-11 DIAGNOSIS — E559 Vitamin D deficiency, unspecified: Secondary | ICD-10-CM | POA: Diagnosis not present

## 2017-08-11 DIAGNOSIS — N184 Chronic kidney disease, stage 4 (severe): Secondary | ICD-10-CM | POA: Diagnosis not present

## 2017-08-11 DIAGNOSIS — M109 Gout, unspecified: Secondary | ICD-10-CM | POA: Diagnosis not present

## 2017-08-20 ENCOUNTER — Telehealth: Payer: Self-pay | Admitting: Cardiology

## 2017-08-20 ENCOUNTER — Ambulatory Visit (INDEPENDENT_AMBULATORY_CARE_PROVIDER_SITE_OTHER): Payer: Medicare Other | Admitting: *Deleted

## 2017-08-20 DIAGNOSIS — I495 Sick sinus syndrome: Secondary | ICD-10-CM

## 2017-08-20 DIAGNOSIS — K219 Gastro-esophageal reflux disease without esophagitis: Secondary | ICD-10-CM | POA: Diagnosis not present

## 2017-08-20 DIAGNOSIS — E7800 Pure hypercholesterolemia, unspecified: Secondary | ICD-10-CM | POA: Diagnosis not present

## 2017-08-20 DIAGNOSIS — M109 Gout, unspecified: Secondary | ICD-10-CM | POA: Diagnosis not present

## 2017-08-20 DIAGNOSIS — M810 Age-related osteoporosis without current pathological fracture: Secondary | ICD-10-CM | POA: Diagnosis not present

## 2017-08-20 DIAGNOSIS — N184 Chronic kidney disease, stage 4 (severe): Secondary | ICD-10-CM | POA: Diagnosis not present

## 2017-08-20 DIAGNOSIS — Z794 Long term (current) use of insulin: Secondary | ICD-10-CM | POA: Diagnosis not present

## 2017-08-20 DIAGNOSIS — I4891 Unspecified atrial fibrillation: Secondary | ICD-10-CM | POA: Diagnosis not present

## 2017-08-20 DIAGNOSIS — E1122 Type 2 diabetes mellitus with diabetic chronic kidney disease: Secondary | ICD-10-CM | POA: Diagnosis not present

## 2017-08-20 DIAGNOSIS — E039 Hypothyroidism, unspecified: Secondary | ICD-10-CM | POA: Diagnosis not present

## 2017-08-20 NOTE — Telephone Encounter (Signed)
LMOVM reminding pt to send remote transmission.   

## 2017-08-24 NOTE — Progress Notes (Signed)
Remote pacemaker transmission.   

## 2017-08-26 ENCOUNTER — Encounter: Payer: Self-pay | Admitting: Cardiology

## 2017-09-08 DIAGNOSIS — C44519 Basal cell carcinoma of skin of other part of trunk: Secondary | ICD-10-CM | POA: Diagnosis not present

## 2017-09-28 LAB — CUP PACEART REMOTE DEVICE CHECK
Battery Remaining Longevity: 81 mo
Battery Voltage: 3.01 V
Brady Statistic AP VP Percent: 0.34 %
Brady Statistic AP VS Percent: 92.61 %
Brady Statistic AS VS Percent: 6 %
Date Time Interrogation Session: 20190412135607
Implantable Lead Implant Date: 20151217
Implantable Lead Location: 753859
Implantable Lead Location: 753860
Implantable Lead Model: 5076
Implantable Pulse Generator Implant Date: 20151217
Lead Channel Impedance Value: 361 Ohm
Lead Channel Impedance Value: 456 Ohm
Lead Channel Pacing Threshold Amplitude: 0.625 V
Lead Channel Pacing Threshold Amplitude: 0.75 V
Lead Channel Pacing Threshold Pulse Width: 0.4 ms
Lead Channel Sensing Intrinsic Amplitude: 0.375 mV
Lead Channel Sensing Intrinsic Amplitude: 10.5 mV
Lead Channel Setting Pacing Amplitude: 2.5 V
MDC IDC LEAD IMPLANT DT: 20151217
MDC IDC MSMT LEADCHNL RA PACING THRESHOLD PULSEWIDTH: 0.4 ms
MDC IDC MSMT LEADCHNL RA SENSING INTR AMPL: 0.375 mV
MDC IDC MSMT LEADCHNL RV IMPEDANCE VALUE: 437 Ohm
MDC IDC MSMT LEADCHNL RV IMPEDANCE VALUE: 475 Ohm
MDC IDC MSMT LEADCHNL RV SENSING INTR AMPL: 10.5 mV
MDC IDC SET LEADCHNL RA PACING AMPLITUDE: 2 V
MDC IDC SET LEADCHNL RV PACING PULSEWIDTH: 0.4 ms
MDC IDC SET LEADCHNL RV SENSING SENSITIVITY: 0.9 mV
MDC IDC STAT BRADY AS VP PERCENT: 1.06 %
MDC IDC STAT BRADY RA PERCENT PACED: 91.55 %
MDC IDC STAT BRADY RV PERCENT PACED: 1.35 %

## 2017-11-19 ENCOUNTER — Ambulatory Visit (INDEPENDENT_AMBULATORY_CARE_PROVIDER_SITE_OTHER): Payer: Medicare Other | Admitting: *Deleted

## 2017-11-19 DIAGNOSIS — I495 Sick sinus syndrome: Secondary | ICD-10-CM | POA: Diagnosis not present

## 2017-11-19 NOTE — Progress Notes (Signed)
Remote pacemaker transmission.   

## 2017-11-24 ENCOUNTER — Other Ambulatory Visit: Payer: Self-pay | Admitting: Internal Medicine

## 2017-11-24 DIAGNOSIS — I4891 Unspecified atrial fibrillation: Secondary | ICD-10-CM

## 2017-12-04 DIAGNOSIS — E1122 Type 2 diabetes mellitus with diabetic chronic kidney disease: Secondary | ICD-10-CM | POA: Diagnosis not present

## 2017-12-04 DIAGNOSIS — E039 Hypothyroidism, unspecified: Secondary | ICD-10-CM | POA: Diagnosis not present

## 2017-12-04 DIAGNOSIS — Z794 Long term (current) use of insulin: Secondary | ICD-10-CM | POA: Diagnosis not present

## 2017-12-04 DIAGNOSIS — M810 Age-related osteoporosis without current pathological fracture: Secondary | ICD-10-CM | POA: Diagnosis not present

## 2017-12-04 DIAGNOSIS — N184 Chronic kidney disease, stage 4 (severe): Secondary | ICD-10-CM | POA: Diagnosis not present

## 2017-12-04 DIAGNOSIS — K219 Gastro-esophageal reflux disease without esophagitis: Secondary | ICD-10-CM | POA: Diagnosis not present

## 2017-12-04 DIAGNOSIS — E7800 Pure hypercholesterolemia, unspecified: Secondary | ICD-10-CM | POA: Diagnosis not present

## 2017-12-04 DIAGNOSIS — M109 Gout, unspecified: Secondary | ICD-10-CM | POA: Diagnosis not present

## 2017-12-04 DIAGNOSIS — I4891 Unspecified atrial fibrillation: Secondary | ICD-10-CM | POA: Diagnosis not present

## 2017-12-21 LAB — CUP PACEART REMOTE DEVICE CHECK
Battery Remaining Longevity: 79 mo
Brady Statistic AP VP Percent: 0.3 %
Brady Statistic AP VS Percent: 93.74 %
Brady Statistic AS VS Percent: 5.52 %
Brady Statistic RV Percent Paced: 0.74 %
Date Time Interrogation Session: 20190711163751
Implantable Lead Implant Date: 20151217
Implantable Lead Location: 753859
Implantable Lead Model: 5076
Implantable Pulse Generator Implant Date: 20151217
Lead Channel Impedance Value: 437 Ohm
Lead Channel Impedance Value: 456 Ohm
Lead Channel Impedance Value: 494 Ohm
Lead Channel Pacing Threshold Amplitude: 0.75 V
Lead Channel Sensing Intrinsic Amplitude: 0.375 mV
Lead Channel Sensing Intrinsic Amplitude: 9.125 mV
Lead Channel Setting Pacing Amplitude: 2 V
Lead Channel Setting Pacing Amplitude: 2.5 V
Lead Channel Setting Pacing Pulse Width: 0.4 ms
Lead Channel Setting Sensing Sensitivity: 0.9 mV
MDC IDC LEAD IMPLANT DT: 20151217
MDC IDC LEAD LOCATION: 753860
MDC IDC MSMT BATTERY VOLTAGE: 3.01 V
MDC IDC MSMT LEADCHNL RA IMPEDANCE VALUE: 361 Ohm
MDC IDC MSMT LEADCHNL RA PACING THRESHOLD AMPLITUDE: 0.875 V
MDC IDC MSMT LEADCHNL RA PACING THRESHOLD PULSEWIDTH: 0.4 ms
MDC IDC MSMT LEADCHNL RA SENSING INTR AMPL: 0.375 mV
MDC IDC MSMT LEADCHNL RV PACING THRESHOLD PULSEWIDTH: 0.4 ms
MDC IDC MSMT LEADCHNL RV SENSING INTR AMPL: 9.125 mV
MDC IDC STAT BRADY AS VP PERCENT: 0.45 %
MDC IDC STAT BRADY RA PERCENT PACED: 92.61 %

## 2018-01-05 DIAGNOSIS — H2513 Age-related nuclear cataract, bilateral: Secondary | ICD-10-CM | POA: Diagnosis not present

## 2018-01-05 DIAGNOSIS — H00021 Hordeolum internum right upper eyelid: Secondary | ICD-10-CM | POA: Diagnosis not present

## 2018-01-05 DIAGNOSIS — H25013 Cortical age-related cataract, bilateral: Secondary | ICD-10-CM | POA: Diagnosis not present

## 2018-01-05 DIAGNOSIS — H40013 Open angle with borderline findings, low risk, bilateral: Secondary | ICD-10-CM | POA: Diagnosis not present

## 2018-01-05 DIAGNOSIS — E119 Type 2 diabetes mellitus without complications: Secondary | ICD-10-CM | POA: Diagnosis not present

## 2018-01-22 DIAGNOSIS — H401131 Primary open-angle glaucoma, bilateral, mild stage: Secondary | ICD-10-CM | POA: Diagnosis not present

## 2018-01-28 DIAGNOSIS — Z85828 Personal history of other malignant neoplasm of skin: Secondary | ICD-10-CM | POA: Diagnosis not present

## 2018-01-28 DIAGNOSIS — Z808 Family history of malignant neoplasm of other organs or systems: Secondary | ICD-10-CM | POA: Diagnosis not present

## 2018-01-28 DIAGNOSIS — D485 Neoplasm of uncertain behavior of skin: Secondary | ICD-10-CM | POA: Diagnosis not present

## 2018-01-28 DIAGNOSIS — D2261 Melanocytic nevi of right upper limb, including shoulder: Secondary | ICD-10-CM | POA: Diagnosis not present

## 2018-01-28 DIAGNOSIS — L570 Actinic keratosis: Secondary | ICD-10-CM | POA: Diagnosis not present

## 2018-01-28 DIAGNOSIS — L720 Epidermal cyst: Secondary | ICD-10-CM | POA: Diagnosis not present

## 2018-02-01 ENCOUNTER — Other Ambulatory Visit: Payer: Self-pay | Admitting: Internal Medicine

## 2018-02-01 DIAGNOSIS — N184 Chronic kidney disease, stage 4 (severe): Secondary | ICD-10-CM | POA: Diagnosis not present

## 2018-02-01 DIAGNOSIS — I129 Hypertensive chronic kidney disease with stage 1 through stage 4 chronic kidney disease, or unspecified chronic kidney disease: Secondary | ICD-10-CM | POA: Diagnosis not present

## 2018-02-01 DIAGNOSIS — E119 Type 2 diabetes mellitus without complications: Secondary | ICD-10-CM | POA: Diagnosis not present

## 2018-02-01 DIAGNOSIS — D631 Anemia in chronic kidney disease: Secondary | ICD-10-CM | POA: Diagnosis not present

## 2018-02-01 DIAGNOSIS — Z23 Encounter for immunization: Secondary | ICD-10-CM | POA: Diagnosis not present

## 2018-02-01 DIAGNOSIS — Z794 Long term (current) use of insulin: Secondary | ICD-10-CM | POA: Diagnosis not present

## 2018-02-01 DIAGNOSIS — N183 Chronic kidney disease, stage 3 (moderate): Secondary | ICD-10-CM | POA: Diagnosis not present

## 2018-02-01 DIAGNOSIS — R809 Proteinuria, unspecified: Secondary | ICD-10-CM | POA: Diagnosis not present

## 2018-02-01 NOTE — Telephone Encounter (Signed)
This is a A-Fib clinic pt. Kathryn Palau, NP prescribed this medication. Please address

## 2018-02-03 DIAGNOSIS — I129 Hypertensive chronic kidney disease with stage 1 through stage 4 chronic kidney disease, or unspecified chronic kidney disease: Secondary | ICD-10-CM | POA: Diagnosis not present

## 2018-02-03 DIAGNOSIS — N184 Chronic kidney disease, stage 4 (severe): Secondary | ICD-10-CM | POA: Diagnosis not present

## 2018-02-05 DIAGNOSIS — H401131 Primary open-angle glaucoma, bilateral, mild stage: Secondary | ICD-10-CM | POA: Diagnosis not present

## 2018-02-18 ENCOUNTER — Ambulatory Visit (INDEPENDENT_AMBULATORY_CARE_PROVIDER_SITE_OTHER): Payer: Medicare Other | Admitting: *Deleted

## 2018-02-18 DIAGNOSIS — I495 Sick sinus syndrome: Secondary | ICD-10-CM

## 2018-02-18 NOTE — Progress Notes (Signed)
Remote pacemaker transmission.   

## 2018-02-25 ENCOUNTER — Encounter: Payer: Self-pay | Admitting: Cardiology

## 2018-03-04 DIAGNOSIS — R400 Somnolence: Secondary | ICD-10-CM | POA: Diagnosis not present

## 2018-03-04 DIAGNOSIS — Z1389 Encounter for screening for other disorder: Secondary | ICD-10-CM | POA: Diagnosis not present

## 2018-03-04 DIAGNOSIS — K760 Fatty (change of) liver, not elsewhere classified: Secondary | ICD-10-CM | POA: Diagnosis not present

## 2018-03-04 DIAGNOSIS — E559 Vitamin D deficiency, unspecified: Secondary | ICD-10-CM | POA: Diagnosis not present

## 2018-03-04 DIAGNOSIS — R609 Edema, unspecified: Secondary | ICD-10-CM | POA: Diagnosis not present

## 2018-03-04 DIAGNOSIS — I4891 Unspecified atrial fibrillation: Secondary | ICD-10-CM | POA: Diagnosis not present

## 2018-03-04 DIAGNOSIS — E039 Hypothyroidism, unspecified: Secondary | ICD-10-CM | POA: Diagnosis not present

## 2018-03-04 DIAGNOSIS — E1122 Type 2 diabetes mellitus with diabetic chronic kidney disease: Secondary | ICD-10-CM | POA: Diagnosis not present

## 2018-03-04 DIAGNOSIS — E7800 Pure hypercholesterolemia, unspecified: Secondary | ICD-10-CM | POA: Diagnosis not present

## 2018-03-04 DIAGNOSIS — N184 Chronic kidney disease, stage 4 (severe): Secondary | ICD-10-CM | POA: Diagnosis not present

## 2018-03-04 DIAGNOSIS — M109 Gout, unspecified: Secondary | ICD-10-CM | POA: Diagnosis not present

## 2018-03-22 ENCOUNTER — Other Ambulatory Visit: Payer: Self-pay | Admitting: Internal Medicine

## 2018-03-22 DIAGNOSIS — I4891 Unspecified atrial fibrillation: Secondary | ICD-10-CM

## 2018-03-30 LAB — CUP PACEART REMOTE DEVICE CHECK
Brady Statistic AP VP Percent: 0.47 %
Brady Statistic AP VS Percent: 90.39 %
Brady Statistic AS VP Percent: 1.09 %
Brady Statistic AS VS Percent: 8.04 %
Brady Statistic RV Percent Paced: 1.54 %
Implantable Lead Implant Date: 20151217
Implantable Lead Implant Date: 20151217
Implantable Lead Location: 753859
Implantable Lead Model: 5076
Implantable Pulse Generator Implant Date: 20151217
Lead Channel Impedance Value: 361 Ohm
Lead Channel Impedance Value: 456 Ohm
Lead Channel Impedance Value: 456 Ohm
Lead Channel Impedance Value: 475 Ohm
Lead Channel Pacing Threshold Amplitude: 0.75 V
Lead Channel Pacing Threshold Amplitude: 0.75 V
Lead Channel Pacing Threshold Pulse Width: 0.4 ms
Lead Channel Sensing Intrinsic Amplitude: 0.625 mV
Lead Channel Sensing Intrinsic Amplitude: 10.5 mV
Lead Channel Setting Pacing Amplitude: 2 V
Lead Channel Setting Pacing Amplitude: 2.5 V
Lead Channel Setting Sensing Sensitivity: 0.9 mV
MDC IDC LEAD LOCATION: 753860
MDC IDC MSMT BATTERY REMAINING LONGEVITY: 72 mo
MDC IDC MSMT BATTERY VOLTAGE: 3 V
MDC IDC MSMT LEADCHNL RA PACING THRESHOLD PULSEWIDTH: 0.4 ms
MDC IDC MSMT LEADCHNL RA SENSING INTR AMPL: 0.625 mV
MDC IDC MSMT LEADCHNL RV SENSING INTR AMPL: 10.5 mV
MDC IDC SESS DTM: 20191010151256
MDC IDC SET LEADCHNL RV PACING PULSEWIDTH: 0.4 ms
MDC IDC STAT BRADY RA PERCENT PACED: 88.99 %

## 2018-05-03 ENCOUNTER — Other Ambulatory Visit (HOSPITAL_COMMUNITY): Payer: Self-pay | Admitting: *Deleted

## 2018-05-03 MED ORDER — DILTIAZEM HCL ER COATED BEADS 240 MG PO CP24: 240.0000 mg | ORAL_CAPSULE | Freq: Every day | ORAL | 3 refills | Status: DC

## 2018-05-12 ENCOUNTER — Other Ambulatory Visit (HOSPITAL_COMMUNITY): Payer: Self-pay | Admitting: Physician Assistant

## 2018-05-14 DIAGNOSIS — E1122 Type 2 diabetes mellitus with diabetic chronic kidney disease: Secondary | ICD-10-CM | POA: Diagnosis not present

## 2018-05-14 DIAGNOSIS — M810 Age-related osteoporosis without current pathological fracture: Secondary | ICD-10-CM | POA: Diagnosis not present

## 2018-05-14 DIAGNOSIS — K219 Gastro-esophageal reflux disease without esophagitis: Secondary | ICD-10-CM | POA: Diagnosis not present

## 2018-05-14 DIAGNOSIS — N184 Chronic kidney disease, stage 4 (severe): Secondary | ICD-10-CM | POA: Diagnosis not present

## 2018-05-14 DIAGNOSIS — E039 Hypothyroidism, unspecified: Secondary | ICD-10-CM | POA: Diagnosis not present

## 2018-05-14 DIAGNOSIS — Z794 Long term (current) use of insulin: Secondary | ICD-10-CM | POA: Diagnosis not present

## 2018-05-18 ENCOUNTER — Ambulatory Visit (INDEPENDENT_AMBULATORY_CARE_PROVIDER_SITE_OTHER): Payer: Medicare Other | Admitting: Internal Medicine

## 2018-05-18 ENCOUNTER — Encounter: Payer: Self-pay | Admitting: Internal Medicine

## 2018-05-18 VITALS — BP 124/70 | HR 84 | Ht 61.5 in | Wt 180.0 lb

## 2018-05-18 DIAGNOSIS — I480 Paroxysmal atrial fibrillation: Secondary | ICD-10-CM

## 2018-05-18 DIAGNOSIS — Z950 Presence of cardiac pacemaker: Secondary | ICD-10-CM

## 2018-05-18 DIAGNOSIS — I495 Sick sinus syndrome: Secondary | ICD-10-CM

## 2018-05-18 NOTE — Patient Instructions (Signed)
Medication Instructions:  Your physician recommends that you continue on your current medications as directed. Please refer to the Current Medication list given to you today.  Labwork: None ordered.  Testing/Procedures: None ordered.  Follow-Up: Your physician wants you to follow-up in: one year with Dr. Lovena Le.   You will receive a reminder letter in the mail two months in advance. If you don't receive a letter, please call our office to schedule the follow-up appointment.  Remote monitoring is used to monitor your Pacemaker from home. This monitoring reduces the number of office visits required to check your device to one time per year. It allows [place] to keep an eye on the functioning of your device to ensure it is working properly. You are scheduled for a device check from home on 05/20/2018. You may send your transmission at any time that day. If you have a wireless device, the transmission will be sent automatically. After your physician reviews your transmission, you will receive a postcard with your next transmission date.  Any Other Special Instructions Will Be Listed Below (If Applicable).  If you need a refill on your cardiac medications before your next appointment, please call your pharmacy.

## 2018-05-18 NOTE — Progress Notes (Signed)
HPI [name] returns today for followup of her DDD PM and PAF. She has a h/o atrial fib and symptomatic bradycardia. She has noted worsening fatigue and weakness over the past few months. This correlates with an uptick of atrial fib on her PM interrogation. No syncope and no edema. No chest pain . She is pending followup with Dr. Buddy Duty. She has thyroid dysfunction and is taking synthroid.  Allergies  Allergen Reactions  . Cortisone Other (See Comments)    Hyperglycemia   . Prednisone Other (See Comments)    Hyperglycemia      Current Outpatient Medications  Medication Sig Dispense Refill  . allopurinol (ZYLOPRIM) 100 MG tablet Take 200 mg by mouth at bedtime.     Marland Kitchen atorvastatin (LIPITOR) 80 MG tablet Take 80 mg by mouth at bedtime.     . benazepril (LOTENSIN) 20 MG tablet Take 20 mg by mouth daily.    Marland Kitchen diltiazem (CARDIZEM CD) 240 MG 24 hr capsule Take 1 capsule (240 mg total) by mouth daily. 90 capsule 3  . diltiazem (CARDIZEM) 30 MG tablet Take 1 tablet every 4 hours AS NEEDED for afib heart rate 90 or greater. 45 tablet 2  . dofetilide (TIKOSYN) 125 MCG capsule TAKE 1 CAPSULE(125 MCG) BY MOUTH TWICE DAILY 180 capsule 3  . ELIQUIS 2.5 MG TABS tablet TAKE 1 TABLET BY MOUTH TWICE DAILY 60 tablet 5  . furosemide (LASIX) 40 MG tablet Take 40 mg by mouth daily.    Marland Kitchen LEVEMIR FLEXTOUCH 100 UNIT/ML Pen Inject 62 Units into the skin every morning.     Marland Kitchen levothyroxine (SYNTHROID, LEVOTHROID) 50 MCG tablet Take 50 mcg by mouth daily. On a empty stomach.    . potassium chloride SA (K-DUR,KLOR-CON) 20 MEQ tablet TAKE 1 AND 1/2 TABLETS(30 MEQ) BY MOUTH DAILY 45 tablet 0   No current facility-administered medications for this visit.      Past Medical History:  Diagnosis Date  . Allergic rhinitis   . Atrial fibrillation ([place])   . Chronic renal disease, stage IV ([place])   . Essential hypertension   . GERD (gastroesophageal reflux disease)   . Gout   . Hyperlipemia, mixed   . Insomnia    . Type 2 diabetes mellitus (HCC)     ROS:   All systems reviewed and negative except as noted in the HPI.   Past Surgical History:  Procedure Laterality Date  . CARDIOVERSION N/A 05/29/2014   Procedure: CARDIOVERSION;  Surgeon: Fay Records, MD;  [facility] ENDOSCOPY;  Service: Cardiovascular;  Laterality: N/A;  . CARDIOVERSION N/A 11/03/2014   Procedure: CARDIOVERSION;  Surgeon: Thayer Headings, MD;  Location: [place];  Service: Cardiovascular;  Laterality: N/A;  . CARDIOVERSION N/A 04/14/2016   Procedure: CARDIOVERSION;  Surgeon: Jerline Pain, MD;  Location: [place];  Service: Cardiovascular;  Laterality: N/A;  . CESAREAN SECTION     x2  . ESOPHAGEAL DILATION    . PERMANENT PACEMAKER INSERTION N/A 04/26/2014   Procedure: PERMANENT PACEMAKER INSERTION;  Surgeon: Evans Lance, MD;  Location: [facility] CATH LAB;  Service: Cardiovascular;  Laterality: N/A;  . TEE WITHOUT CARDIOVERSION N/A 05/29/2014   Procedure: TRANSESOPHAGEAL ECHOCARDIOGRAM (TEE);  Surgeon: Fay Records, MD;  Location: [facility] ENDOSCOPY;  Service: Cardiovascular;  Laterality: N/A;     Family History  Problem Relation Age of Onset  . Coronary artery disease Father 72  . Heart attack Father   . Congestive Heart Failure Mother   .  Alzheimer's disease Mother      Social History   Socioeconomic History  . Marital status: Married    Spouse name: Not on file  . Number of children: Not on file  . Years of education: Not on file  . Highest education level: Not on file  Occupational History  . Not on file  Social Needs  . Financial resource strain: Not on file  . Food insecurity:    Worry: Not on file    Inability: Not on file  . Transportation needs:    Medical: Not on file    Non-medical: Not on file  Tobacco Use  . Smoking status: Never Smoker  . Smokeless tobacco: Never Used  Substance and Sexual Activity  . Alcohol use: No    Alcohol/week: 0.0 standard drinks  . Drug use: No  . Sexual  activity: Yes    Birth control/protection: Other-see comments  Lifestyle  . Physical activity:    Days per week: Not on file    Minutes per session: Not on file  . Stress: Not on file  Relationships  . Social connections:    Talks on phone: Not on file    Gets together: Not on file    Attends religious service: Not on file    Active member of club or organization: Not on file    Attends meetings of clubs or organizations: Not on file    Relationship status: Not on file  . Intimate partner violence:    Fear of current or ex partner: Not on file    Emotionally abused: Not on file    Physically abused: Not on file    Forced sexual activity: Not on file  Other Topics Concern  . Not on file  Social History Narrative   Lives in [place].  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     BP 124/70   Pulse 84   Ht 5' 1.5" (1.562 m)   Wt 180 lb (81.6 kg)   BMI 33.46 kg/m   Physical Exam:  Well appearing 84 yo woman, NAD HEENT: Unremarkable Neck:  6 cm JVD, no thyromegally Lymphatics:  No adenopathy Back:  No CVA tenderness Lungs:  Clear with no wheezes HEART:  Regular rate rhythm, no murmurs, no rubs, no clicks Abd:  soft, positive bowel sounds, no organomegally, no rebound, no guarding Ext:  2 plus pulses, no edema, no cyanosis, no clubbing Skin:  No rashes no nodules Neuro:  CN II through XII intact, motor grossly intact  EKG - nsr with atrial pacing  DEVICE  Normal device function.  See PaceArt for details.   Assess/Plan: 1. Sinus node dysfunction - she is asymptomatic, s/p PPM insertion. 2. PPM - her Medtronic DDD PM is working normally. 3. PAF - she has had worsening atrial fib. She does not have palpitations. I discussed switching her to amiodarone and stopping the dofetilide. She is pending followup with Dr. Buddy Duty.  4. Thyroid dysfunction - I wonder if her fatigue could be related. If she is euthyroid then I might consider switching to amiodarone for  better control of her atrial fib.  Mikle Bosworth.D.

## 2018-05-19 ENCOUNTER — Other Ambulatory Visit: Payer: Self-pay | Admitting: Internal Medicine

## 2018-05-19 LAB — CUP PACEART INCLINIC DEVICE CHECK
Brady Statistic AP VP Percent: 0.38 %
Brady Statistic AP VS Percent: 90.81 %
Brady Statistic AS VP Percent: 1.39 %
Brady Statistic RA Percent Paced: 89.46 %
Implantable Lead Implant Date: 20151217
Implantable Lead Location: 753859
Implantable Lead Location: 753860
Implantable Lead Model: 5076
Implantable Pulse Generator Implant Date: 20151217
Lead Channel Impedance Value: 361 Ohm
Lead Channel Impedance Value: 437 Ohm
Lead Channel Impedance Value: 437 Ohm
Lead Channel Pacing Threshold Pulse Width: 0.4 ms
Lead Channel Sensing Intrinsic Amplitude: 0.375 mV
Lead Channel Sensing Intrinsic Amplitude: 9.5 mV
Lead Channel Sensing Intrinsic Amplitude: 9.875 mV
Lead Channel Setting Pacing Amplitude: 2 V
Lead Channel Setting Pacing Amplitude: 2.5 V
Lead Channel Setting Pacing Pulse Width: 0.4 ms
MDC IDC LEAD IMPLANT DT: 20151217
MDC IDC MSMT BATTERY REMAINING LONGEVITY: 72 mo
MDC IDC MSMT BATTERY VOLTAGE: 3 V
MDC IDC MSMT LEADCHNL RA PACING THRESHOLD AMPLITUDE: 0.75 V
MDC IDC MSMT LEADCHNL RA PACING THRESHOLD PULSEWIDTH: 0.4 ms
MDC IDC MSMT LEADCHNL RA SENSING INTR AMPL: 0.375 mV
MDC IDC MSMT LEADCHNL RV IMPEDANCE VALUE: 456 Ohm
MDC IDC MSMT LEADCHNL RV PACING THRESHOLD AMPLITUDE: 0.75 V
MDC IDC SESS DTM: 20200107214825
MDC IDC SET LEADCHNL RV SENSING SENSITIVITY: 0.9 mV
MDC IDC STAT BRADY AS VS PERCENT: 7.42 %
MDC IDC STAT BRADY RV PERCENT PACED: 1.72 %

## 2018-05-20 ENCOUNTER — Ambulatory Visit (INDEPENDENT_AMBULATORY_CARE_PROVIDER_SITE_OTHER): Payer: Medicare Other

## 2018-05-20 DIAGNOSIS — I495 Sick sinus syndrome: Secondary | ICD-10-CM | POA: Diagnosis not present

## 2018-05-21 ENCOUNTER — Encounter: Payer: Self-pay | Admitting: Cardiology

## 2018-05-22 LAB — CUP PACEART REMOTE DEVICE CHECK
Brady Statistic AP VS Percent: 98.76 %
Brady Statistic AS VP Percent: 0 %
Brady Statistic AS VS Percent: 1.14 %
Brady Statistic RV Percent Paced: 0.11 %
Date Time Interrogation Session: 20200110205857
Implantable Lead Implant Date: 20151217
Implantable Lead Implant Date: 20151217
Implantable Lead Location: 753860
Implantable Lead Model: 5076
Lead Channel Impedance Value: 361 Ohm
Lead Channel Impedance Value: 456 Ohm
Lead Channel Pacing Threshold Amplitude: 0.625 V
Lead Channel Sensing Intrinsic Amplitude: 1.125 mV
Lead Channel Sensing Intrinsic Amplitude: 1.125 mV
Lead Channel Sensing Intrinsic Amplitude: 8.625 mV
Lead Channel Sensing Intrinsic Amplitude: 8.625 mV
Lead Channel Setting Pacing Amplitude: 2 V
Lead Channel Setting Pacing Pulse Width: 0.4 ms
MDC IDC LEAD LOCATION: 753859
MDC IDC MSMT BATTERY REMAINING LONGEVITY: 70 mo
MDC IDC MSMT BATTERY VOLTAGE: 3 V
MDC IDC MSMT LEADCHNL RA IMPEDANCE VALUE: 437 Ohm
MDC IDC MSMT LEADCHNL RA PACING THRESHOLD AMPLITUDE: 0.75 V
MDC IDC MSMT LEADCHNL RA PACING THRESHOLD PULSEWIDTH: 0.4 ms
MDC IDC MSMT LEADCHNL RV IMPEDANCE VALUE: 437 Ohm
MDC IDC MSMT LEADCHNL RV PACING THRESHOLD PULSEWIDTH: 0.4 ms
MDC IDC PG IMPLANT DT: 20151217
MDC IDC SET LEADCHNL RV PACING AMPLITUDE: 2.5 V
MDC IDC SET LEADCHNL RV SENSING SENSITIVITY: 0.9 mV
MDC IDC STAT BRADY AP VP PERCENT: 0.09 %
MDC IDC STAT BRADY RA PERCENT PACED: 98.72 %

## 2018-05-24 NOTE — Progress Notes (Signed)
Remote pacemaker transmission.   

## 2018-06-07 DIAGNOSIS — M810 Age-related osteoporosis without current pathological fracture: Secondary | ICD-10-CM | POA: Diagnosis not present

## 2018-06-21 ENCOUNTER — Other Ambulatory Visit: Payer: Self-pay | Admitting: Physician Assistant

## 2018-06-22 ENCOUNTER — Other Ambulatory Visit (HOSPITAL_COMMUNITY): Payer: Self-pay | Admitting: Physician Assistant

## 2018-06-23 ENCOUNTER — Other Ambulatory Visit (HOSPITAL_COMMUNITY): Payer: Self-pay | Admitting: Physician Assistant

## 2018-06-28 ENCOUNTER — Other Ambulatory Visit: Payer: Self-pay | Admitting: *Deleted

## 2018-06-28 DIAGNOSIS — I4891 Unspecified atrial fibrillation: Secondary | ICD-10-CM

## 2018-06-28 MED ORDER — APIXABAN 2.5 MG PO TABS: 2.5000 mg | ORAL_TABLET | Freq: Two times a day (BID) | ORAL | 5 refills | Status: DC

## 2018-06-28 NOTE — Telephone Encounter (Signed)
SCr 2.06 drawn at [facility] 05/14/18, age > 80, pt on correct dose of Eliquis 2.5mg  BID.

## 2018-06-29 DIAGNOSIS — M25572 Pain in left ankle and joints of left foot: Secondary | ICD-10-CM | POA: Diagnosis not present

## 2018-08-19 ENCOUNTER — Telehealth: Payer: Self-pay

## 2018-08-19 ENCOUNTER — Ambulatory Visit (INDEPENDENT_AMBULATORY_CARE_PROVIDER_SITE_OTHER): Payer: Medicare Other | Admitting: *Deleted

## 2018-08-19 ENCOUNTER — Other Ambulatory Visit: Payer: Self-pay

## 2018-08-19 DIAGNOSIS — I495 Sick sinus syndrome: Secondary | ICD-10-CM | POA: Diagnosis not present

## 2018-08-19 LAB — CUP PACEART REMOTE DEVICE CHECK
Battery Remaining Longevity: 71 mo
Battery Voltage: 3 V
Brady Statistic AP VP Percent: 0.29 %
Brady Statistic AP VS Percent: 84.48 %
Brady Statistic AS VP Percent: 4.77 %
Brady Statistic AS VS Percent: 10.46 %
Brady Statistic RA Percent Paced: 82.21 %
Brady Statistic RV Percent Paced: 4.78 %
Date Time Interrogation Session: 20200409164626
Implantable Lead Implant Date: 20151217
Implantable Lead Implant Date: 20151217
Implantable Lead Location: 753859
Implantable Lead Location: 753860
Implantable Lead Model: 5076
Implantable Lead Model: 5076
Implantable Pulse Generator Implant Date: 20151217
Lead Channel Impedance Value: 342 Ohm
Lead Channel Impedance Value: 418 Ohm
Lead Channel Impedance Value: 437 Ohm
Lead Channel Impedance Value: 437 Ohm
Lead Channel Pacing Threshold Amplitude: 0.625 V
Lead Channel Pacing Threshold Amplitude: 0.875 V
Lead Channel Pacing Threshold Pulse Width: 0.4 ms
Lead Channel Pacing Threshold Pulse Width: 0.4 ms
Lead Channel Sensing Intrinsic Amplitude: 0.5 mV
Lead Channel Sensing Intrinsic Amplitude: 9.375 mV
Lead Channel Setting Pacing Amplitude: 2 V
Lead Channel Setting Pacing Amplitude: 2.5 V
Lead Channel Setting Pacing Pulse Width: 0.4 ms
Lead Channel Setting Sensing Sensitivity: 0.9 mV

## 2018-08-19 NOTE — Telephone Encounter (Signed)
Spoke with patient to remind of missed remote transmission 

## 2018-08-30 ENCOUNTER — Encounter: Payer: Self-pay | Admitting: Cardiology

## 2018-08-30 NOTE — Progress Notes (Signed)
Remote pacemaker transmission.   

## 2018-09-09 DIAGNOSIS — E039 Hypothyroidism, unspecified: Secondary | ICD-10-CM | POA: Diagnosis not present

## 2018-09-09 DIAGNOSIS — N184 Chronic kidney disease, stage 4 (severe): Secondary | ICD-10-CM | POA: Diagnosis not present

## 2018-09-09 DIAGNOSIS — M109 Gout, unspecified: Secondary | ICD-10-CM | POA: Diagnosis not present

## 2018-09-09 DIAGNOSIS — E1122 Type 2 diabetes mellitus with diabetic chronic kidney disease: Secondary | ICD-10-CM | POA: Diagnosis not present

## 2018-09-09 DIAGNOSIS — E7800 Pure hypercholesterolemia, unspecified: Secondary | ICD-10-CM | POA: Diagnosis not present

## 2018-09-09 DIAGNOSIS — R609 Edema, unspecified: Secondary | ICD-10-CM | POA: Diagnosis not present

## 2018-09-09 DIAGNOSIS — I4891 Unspecified atrial fibrillation: Secondary | ICD-10-CM | POA: Diagnosis not present

## 2018-09-09 DIAGNOSIS — E559 Vitamin D deficiency, unspecified: Secondary | ICD-10-CM | POA: Diagnosis not present

## 2018-09-14 DIAGNOSIS — Z794 Long term (current) use of insulin: Secondary | ICD-10-CM | POA: Diagnosis not present

## 2018-09-14 DIAGNOSIS — M810 Age-related osteoporosis without current pathological fracture: Secondary | ICD-10-CM | POA: Diagnosis not present

## 2018-09-14 DIAGNOSIS — E1122 Type 2 diabetes mellitus with diabetic chronic kidney disease: Secondary | ICD-10-CM | POA: Diagnosis not present

## 2018-09-14 DIAGNOSIS — N184 Chronic kidney disease, stage 4 (severe): Secondary | ICD-10-CM | POA: Diagnosis not present

## 2018-09-14 DIAGNOSIS — K219 Gastro-esophageal reflux disease without esophagitis: Secondary | ICD-10-CM | POA: Diagnosis not present

## 2018-09-14 DIAGNOSIS — E039 Hypothyroidism, unspecified: Secondary | ICD-10-CM | POA: Diagnosis not present

## 2018-10-20 DIAGNOSIS — E1122 Type 2 diabetes mellitus with diabetic chronic kidney disease: Secondary | ICD-10-CM | POA: Diagnosis not present

## 2018-10-20 DIAGNOSIS — I129 Hypertensive chronic kidney disease with stage 1 through stage 4 chronic kidney disease, or unspecified chronic kidney disease: Secondary | ICD-10-CM | POA: Diagnosis not present

## 2018-10-20 DIAGNOSIS — N183 Chronic kidney disease, stage 3 (moderate): Secondary | ICD-10-CM | POA: Diagnosis not present

## 2018-10-20 DIAGNOSIS — R809 Proteinuria, unspecified: Secondary | ICD-10-CM | POA: Diagnosis not present

## 2018-10-27 DIAGNOSIS — Z794 Long term (current) use of insulin: Secondary | ICD-10-CM | POA: Diagnosis not present

## 2018-10-27 DIAGNOSIS — E0821 Diabetes mellitus due to underlying condition with diabetic nephropathy: Secondary | ICD-10-CM | POA: Diagnosis not present

## 2018-10-27 DIAGNOSIS — N183 Chronic kidney disease, stage 3 (moderate): Secondary | ICD-10-CM | POA: Diagnosis not present

## 2018-11-04 DIAGNOSIS — R300 Dysuria: Secondary | ICD-10-CM | POA: Diagnosis not present

## 2018-11-18 ENCOUNTER — Encounter: Payer: Medicare Other | Admitting: *Deleted

## 2018-11-19 ENCOUNTER — Telehealth: Payer: Self-pay

## 2018-11-19 NOTE — Telephone Encounter (Signed)
Left message for patient to remind of missed remote transmission.  

## 2018-11-22 ENCOUNTER — Ambulatory Visit (INDEPENDENT_AMBULATORY_CARE_PROVIDER_SITE_OTHER): Payer: Medicare Other | Admitting: *Deleted

## 2018-11-22 DIAGNOSIS — R001 Bradycardia, unspecified: Secondary | ICD-10-CM

## 2018-11-22 DIAGNOSIS — I4891 Unspecified atrial fibrillation: Secondary | ICD-10-CM | POA: Diagnosis not present

## 2018-11-22 LAB — CUP PACEART REMOTE DEVICE CHECK
Battery Remaining Longevity: 60 mo
Battery Voltage: 2.99 V
Brady Statistic AP VP Percent: 0.37 %
Brady Statistic AP VS Percent: 70.83 %
Brady Statistic AS VP Percent: 4.57 %
Brady Statistic AS VS Percent: 24.23 %
Brady Statistic RA Percent Paced: 66.98 %
Brady Statistic RV Percent Paced: 4.81 %
Date Time Interrogation Session: 20200713161214
Implantable Lead Implant Date: 20151217
Implantable Lead Implant Date: 20151217
Implantable Lead Location: 753859
Implantable Lead Location: 753860
Implantable Lead Model: 5076
Implantable Lead Model: 5076
Implantable Pulse Generator Implant Date: 20151217
Lead Channel Impedance Value: 342 Ohm
Lead Channel Impedance Value: 437 Ohm
Lead Channel Impedance Value: 456 Ohm
Lead Channel Impedance Value: 475 Ohm
Lead Channel Pacing Threshold Amplitude: 0.75 V
Lead Channel Pacing Threshold Amplitude: 0.75 V
Lead Channel Pacing Threshold Pulse Width: 0.4 ms
Lead Channel Pacing Threshold Pulse Width: 0.4 ms
Lead Channel Sensing Intrinsic Amplitude: 0.75 mV
Lead Channel Sensing Intrinsic Amplitude: 0.75 mV
Lead Channel Sensing Intrinsic Amplitude: 10.25 mV
Lead Channel Sensing Intrinsic Amplitude: 10.25 mV
Lead Channel Setting Pacing Amplitude: 2 V
Lead Channel Setting Pacing Amplitude: 2.5 V
Lead Channel Setting Pacing Pulse Width: 0.4 ms
Lead Channel Setting Sensing Sensitivity: 0.9 mV

## 2018-11-29 ENCOUNTER — Telehealth: Payer: Self-pay | Admitting: Internal Medicine

## 2018-11-29 NOTE — Telephone Encounter (Signed)
Spoke with patient who states she is having more frequent episodes of a fib that are lasting longer periods of time. States she has been in a fib since last Thursday and prior to that she had only a few days without a fib in a two week period. I asked about appointment and lab work from Dr. Buddy Duty, endocrinologist as previously discussed by Dr. Lovena Le in note from 1/7 ov. Patient states she does not remember the specifics but she does remember that lab work was normal. We do not have documentation of recent thyroid studies. Confirmed patient's dose of dofetilide 125 mcg and diltiazem 240 mg daily. States she takes diltiazem 30 mg only one time per day if HR > 90 bpm but does not repeat every 4 hours as directed if HR remains >90 bpm.  I advised her to continue current medications and to increase diltiazem 30 mg up to every 4 hours as needed per directions on the bottle and that I will forward message to Dr. Lovena Le for advice. Patient verbalized understanding and agreement and thanked me for the call.

## 2018-11-29 NOTE — Telephone Encounter (Signed)
  Patient is calling because she is in Afib and feels that she has been since last Thursday. Before that she was in it for five days straight and then went out of it for a couple days and then went back in on Thursday. She denies any chest pain or other symptoms other than she does feel fatigued. She states her HR in the 90's most of the time but occasionally has gotten up to 113. Please advise.

## 2018-11-29 NOTE — Telephone Encounter (Signed)
She will need to come in to be seen. She has a complicated situation. GT

## 2018-11-30 NOTE — Telephone Encounter (Signed)
Pt scheduled to see Dr. Lovena Le 12/03/2018.  Pt aware.

## 2018-12-01 ENCOUNTER — Telehealth: Payer: Self-pay | Admitting: *Deleted

## 2018-12-01 NOTE — Telephone Encounter (Signed)
   Primary Cardiologist: No primary care provider on file.   Pt contacted.  History and symptoms reviewed.  Pt will f/u with [facility] provider as scheduled.  Pt. advised that we are restricting visitors at this time and request that only patients present for check-in prior to their appointment.  All other visitors should remain in their car.  If necessary, only one visitor may come with the patient, into the building. For everyone's safety, all patients and visitor entering our practice area should expect to be screened again prior to entering our waiting area.  Sonnet Rizor  12/01/2018 3:59 PM     COVID-19 Pre-Screening Questions:  . In the past 7 to 10 days have you had a cough,  shortness of breath, headache, congestion, fever (100 or greater) body aches, chills, sore throat, or sudden loss of taste or sense of smell? no . Have you been around anyone with known Covid 19? no . Have you been around anyone who is awaiting Covid 19 test results in the past 7 to 10 days? no . Have you been around anyone who has been exposed to Covid 19, or has mentioned symptoms of Covid 19 within the past 7 to 10 days? no  If you have any concerns/questions about symptoms patients report during screening (either on the phone or at threshold). Contact the provider seeing the patient or DOD for further guidance.  If neither are available contact a member of the leadership team.

## 2018-12-03 ENCOUNTER — Ambulatory Visit (INDEPENDENT_AMBULATORY_CARE_PROVIDER_SITE_OTHER): Payer: Medicare Other | Admitting: Internal Medicine

## 2018-12-03 ENCOUNTER — Encounter: Payer: Self-pay | Admitting: Internal Medicine

## 2018-12-03 ENCOUNTER — Other Ambulatory Visit: Payer: Self-pay

## 2018-12-03 VITALS — BP 148/70 | HR 97 | Ht 61.5 in | Wt 190.0 lb

## 2018-12-03 DIAGNOSIS — I480 Paroxysmal atrial fibrillation: Secondary | ICD-10-CM

## 2018-12-03 DIAGNOSIS — Z950 Presence of cardiac pacemaker: Secondary | ICD-10-CM | POA: Diagnosis not present

## 2018-12-03 DIAGNOSIS — R001 Bradycardia, unspecified: Secondary | ICD-10-CM | POA: Diagnosis not present

## 2018-12-03 MED ORDER — DILTIAZEM HCL 30 MG PO TABS: ORAL_TABLET | ORAL | 3 refills | Status: DC

## 2018-12-03 MED ORDER — DILTIAZEM HCL ER COATED BEADS 360 MG PO CP24: 360.0000 mg | ORAL_CAPSULE | Freq: Every day | ORAL | 3 refills | Status: DC

## 2018-12-03 NOTE — Progress Notes (Signed)
HPI Kathryn Richardson returns today for followup of her worsening atrial fib. She is a pleasant 84 yo woman with sinus node dysfunction and progressively worsening atrial fib. She has been on low dose dofetilide. She has initially had pretty good control of her atrial fib but over the past couple of months she has had more atrial fib and more symptoms including palpitations/sob/fatigue. No syncope. HR VR are in the 90-120 range.  Allergies  Allergen Reactions  . Cortisone Other (See Comments)    Hyperglycemia   . Prednisone Other (See Comments)    Hyperglycemia      Current Outpatient Medications  Medication Sig Dispense Refill  . allopurinol (ZYLOPRIM) 100 MG tablet Take 200 mg by mouth at bedtime.     Marland Kitchen apixaban (ELIQUIS) 2.5 MG TABS tablet Take 1 tablet (2.5 mg total) by mouth 2 (two) times daily. 60 tablet 5  . atorvastatin (LIPITOR) 80 MG tablet Take 80 mg by mouth at bedtime.     . benazepril (LOTENSIN) 20 MG tablet Take 20 mg by mouth daily.    Marland Kitchen diltiazem (CARDIZEM) 30 MG tablet Take 1 tablet every 4 hours AS NEEDED for afib heart rate 90 or greater. 90 tablet 3  . dofetilide (TIKOSYN) 125 MCG capsule TAKE 1 CAPSULE(125 MCG) BY MOUTH TWICE DAILY 180 capsule 3  . furosemide (LASIX) 40 MG tablet Take 40 mg by mouth daily.    Marland Kitchen HUMALOG KWIKPEN 100 UNIT/ML KwikPen Inject 8 Units into the skin 3 (three) times daily.    Marland Kitchen LANTUS SOLOSTAR 100 UNIT/ML Solostar Pen Inject 62 Units into the skin daily.    Marland Kitchen LEVEMIR FLEXTOUCH 100 UNIT/ML Pen Inject 62 Units into the skin every morning.     Marland Kitchen levothyroxine (SYNTHROID, LEVOTHROID) 50 MCG tablet Take 50 mcg by mouth daily. On a empty stomach.    . potassium chloride SA (K-DUR,KLOR-CON) 20 MEQ tablet TAKE 1 AND 1/2 TABLETS BY MOUTH DAILY 45 tablet 11  . rosuvastatin (CRESTOR) 20 MG tablet Take 1 tablet by mouth daily.    Marland Kitchen diltiazem (CARDIZEM CD) 360 MG 24 hr capsule Take 1 capsule (360 mg total) by mouth daily. 90 capsule 3   No current  facility-administered medications for this visit.      Past Medical History:  Diagnosis Date  . Allergic rhinitis   . Atrial fibrillation ([place])   . Chronic renal disease, stage IV ([place])   . Essential hypertension   . GERD (gastroesophageal reflux disease)   . Gout   . Hyperlipemia, mixed   . Insomnia   . Type 2 diabetes mellitus (HCC)     ROS:   All systems reviewed and negative except as noted in the HPI.   Past Surgical History:  Procedure Laterality Date  . CARDIOVERSION N/A 05/29/2014   Procedure: CARDIOVERSION;  Surgeon: Fay Records, MD;  Location: [facility] ENDOSCOPY;  Service: Cardiovascular;  Laterality: N/A;  . CARDIOVERSION N/A 11/03/2014   Procedure: CARDIOVERSION;  Surgeon: Thayer Headings, MD;  Location: [place];  Service: Cardiovascular;  Laterality: N/A;  . CARDIOVERSION N/A 04/14/2016   Procedure: CARDIOVERSION;  Surgeon: Jerline Pain, MD;  Location: [place];  Service: Cardiovascular;  Laterality: N/A;  . CESAREAN SECTION     x2  . ESOPHAGEAL DILATION    . PERMANENT PACEMAKER INSERTION N/A 04/26/2014   Procedure: PERMANENT PACEMAKER INSERTION;  Surgeon: Evans Lance, MD;  Location: [facility] CATH LAB;  Service: Cardiovascular;  Laterality: N/A;  . TEE  WITHOUT CARDIOVERSION N/A 05/29/2014   Procedure: TRANSESOPHAGEAL ECHOCARDIOGRAM (TEE);  Surgeon: Fay Records, MD;  Location: [facility] ENDOSCOPY;  Service: Cardiovascular;  Laterality: N/A;     Family History  Problem Relation Age of Onset  . Coronary artery disease Father 48  . Heart attack Father   . Congestive Heart Failure Mother   . Alzheimer's disease Mother      Social History   Socioeconomic History  . Marital status: Married    Spouse name: Not on file  . Number of children: Not on file  . Years of education: Not on file  . Highest education level: Not on file  Occupational History  . Not on file  Social Needs  . Financial resource strain: Not on file  . Food insecurity    Worry: Not on file     Inability: Not on file  . Transportation needs    Medical: Not on file    Non-medical: Not on file  Tobacco Use  . Smoking status: Never Smoker  . Smokeless tobacco: Never Used  Substance and Sexual Activity  . Alcohol use: No    Alcohol/week: 0.0 standard drinks  . Drug use: No  . Sexual activity: Yes    Birth control/protection: Other-see comments  Lifestyle  . Physical activity    Days per week: Not on file    Minutes per session: Not on file  . Stress: Not on file  Relationships  . Social Herbalist on phone: Not on file    Gets together: Not on file    Attends religious service: Not on file    Active member of club or organization: Not on file    Attends meetings of clubs or organizations: Not on file    Relationship status: Not on file  . Intimate partner violence    Fear of current or ex partner: Not on file    Emotionally abused: Not on file    Physically abused: Not on file    Forced sexual activity: Not on file  Other Topics Concern  . Not on file  Social History Narrative   Lives in [place].  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     BP (!) 148/70   Pulse 97   Ht 5' 1.5" (1.562 m)   Wt 190 lb (86.2 kg)   SpO2 98%   BMI 35.32 kg/m   Physical Exam:  Well appearing 84 yo woman, NAD HEENT: Unremarkable Neck:  No JVD, no thyromegally Lymphatics:  No adenopathy Back:  No CVA tenderness Lungs:  Clear with no wheezes HEART:  IRegular rate rhythm, no murmurs, no rubs, no clicks Abd:  soft, positive bowel sounds, no organomegally, no rebound, no guarding Ext:  2 plus pulses, no edema, no cyanosis, no clubbing Skin:  No rashes no nodules Neuro:  CN II through XII intact, motor grossly intact  EKG Atrial fib with a controlled VR DEVICE  Normal device function.  See PaceArt for details.   Assess/Plan: 1. PAF - I have discussed the treatment options in detail including uptitration of her calcium antagonist, switching to  amiodarone, and AV node ablation and PPM. She would like to first try uptitration of her calcium channel blocker. 2. HTN - her pressure is a little high today. I suspect that she will be able to tolerate additional cardizem. 3. PPM - her Medtronic DDD PM is working normally. 4.  Sinus node dysfunction - she is currently stable, s/p PPM  insertion.  Mikle Bosworth.D.

## 2018-12-03 NOTE — Patient Instructions (Addendum)
Medication Instructions:  Your physician has recommended you make the following change in your medication:   1.  Increase your Cardizem CD--Start taking 360 mg --one capsule by mouth daily.   Labwork: None ordered.  Testing/Procedures: None ordered.  Follow-Up: Your physician wants you to follow-up in: December with Dr. Lovena Le.  You will get a recall letter.  Remote monitoring is used to monitor your Pacemaker from home. This monitoring reduces the number of office visits required to check your device to one time per year. It allows [place] to keep an eye on the functioning of your device to ensure it is working properly. You are scheduled for a device check from home on 03/07/2019. You may send your transmission at any time that day. If you have a wireless device, the transmission will be sent automatically. After your physician reviews your transmission, you will receive a postcard with your next transmission date.  Any Other Special Instructions Will Be Listed Below (If Applicable).  If you need a refill on your cardiac medications before your next appointment, please call your pharmacy.

## 2018-12-03 NOTE — Progress Notes (Signed)
Remote pacemaker transmission.   

## 2018-12-13 ENCOUNTER — Other Ambulatory Visit: Payer: Self-pay | Admitting: Internal Medicine

## 2018-12-29 DIAGNOSIS — M810 Age-related osteoporosis without current pathological fracture: Secondary | ICD-10-CM | POA: Diagnosis not present

## 2019-02-16 DIAGNOSIS — N1832 Chronic kidney disease, stage 3b: Secondary | ICD-10-CM | POA: Diagnosis not present

## 2019-02-16 DIAGNOSIS — R809 Proteinuria, unspecified: Secondary | ICD-10-CM | POA: Diagnosis not present

## 2019-02-16 DIAGNOSIS — I129 Hypertensive chronic kidney disease with stage 1 through stage 4 chronic kidney disease, or unspecified chronic kidney disease: Secondary | ICD-10-CM | POA: Diagnosis not present

## 2019-02-16 DIAGNOSIS — E119 Type 2 diabetes mellitus without complications: Secondary | ICD-10-CM | POA: Diagnosis not present

## 2019-02-16 DIAGNOSIS — G4700 Insomnia, unspecified: Secondary | ICD-10-CM | POA: Diagnosis not present

## 2019-02-16 DIAGNOSIS — D631 Anemia in chronic kidney disease: Secondary | ICD-10-CM | POA: Diagnosis not present

## 2019-02-17 DIAGNOSIS — E7800 Pure hypercholesterolemia, unspecified: Secondary | ICD-10-CM | POA: Diagnosis not present

## 2019-02-17 DIAGNOSIS — E559 Vitamin D deficiency, unspecified: Secondary | ICD-10-CM | POA: Diagnosis not present

## 2019-02-17 DIAGNOSIS — E1122 Type 2 diabetes mellitus with diabetic chronic kidney disease: Secondary | ICD-10-CM | POA: Diagnosis not present

## 2019-02-17 DIAGNOSIS — R609 Edema, unspecified: Secondary | ICD-10-CM | POA: Diagnosis not present

## 2019-02-17 DIAGNOSIS — G629 Polyneuropathy, unspecified: Secondary | ICD-10-CM | POA: Diagnosis not present

## 2019-02-17 DIAGNOSIS — I4891 Unspecified atrial fibrillation: Secondary | ICD-10-CM | POA: Diagnosis not present

## 2019-02-17 DIAGNOSIS — N184 Chronic kidney disease, stage 4 (severe): Secondary | ICD-10-CM | POA: Diagnosis not present

## 2019-02-17 DIAGNOSIS — E039 Hypothyroidism, unspecified: Secondary | ICD-10-CM | POA: Diagnosis not present

## 2019-02-17 DIAGNOSIS — M109 Gout, unspecified: Secondary | ICD-10-CM | POA: Diagnosis not present

## 2019-02-21 DIAGNOSIS — N1832 Chronic kidney disease, stage 3b: Secondary | ICD-10-CM | POA: Diagnosis not present

## 2019-02-22 DIAGNOSIS — E119 Type 2 diabetes mellitus without complications: Secondary | ICD-10-CM | POA: Diagnosis not present

## 2019-02-22 DIAGNOSIS — H401131 Primary open-angle glaucoma, bilateral, mild stage: Secondary | ICD-10-CM | POA: Diagnosis not present

## 2019-02-23 ENCOUNTER — Telehealth: Payer: Self-pay | Admitting: Internal Medicine

## 2019-02-23 NOTE — Telephone Encounter (Signed)
New message     Pt could not send pacemaker reading for two days.  Pacemaker company will send her a new reader.  Calling to let device team know that it will be a week or so before she gets her new reader.  She is not having any problems.  If you need her to come to the office to have a device check, please call her.  If not, she will send in a reading when she gets her new reader.

## 2019-02-23 NOTE — Telephone Encounter (Signed)
I called pt to let her know we got her message and as soon as she get the new monitor to send the transmission. The pt verbalized understanding and thanked me for the call.

## 2019-02-25 DIAGNOSIS — Z23 Encounter for immunization: Secondary | ICD-10-CM | POA: Diagnosis not present

## 2019-02-25 DIAGNOSIS — E559 Vitamin D deficiency, unspecified: Secondary | ICD-10-CM | POA: Diagnosis not present

## 2019-02-25 DIAGNOSIS — E7800 Pure hypercholesterolemia, unspecified: Secondary | ICD-10-CM | POA: Diagnosis not present

## 2019-02-25 DIAGNOSIS — M109 Gout, unspecified: Secondary | ICD-10-CM | POA: Diagnosis not present

## 2019-03-01 ENCOUNTER — Ambulatory Visit (INDEPENDENT_AMBULATORY_CARE_PROVIDER_SITE_OTHER): Payer: Medicare Other | Admitting: *Deleted

## 2019-03-01 DIAGNOSIS — I4891 Unspecified atrial fibrillation: Secondary | ICD-10-CM | POA: Diagnosis not present

## 2019-03-01 DIAGNOSIS — R55 Syncope and collapse: Secondary | ICD-10-CM

## 2019-03-02 LAB — CUP PACEART REMOTE DEVICE CHECK
Battery Remaining Longevity: 57 mo
Battery Voltage: 2.99 V
Brady Statistic AP VP Percent: 0.3 %
Brady Statistic AP VS Percent: 66.57 %
Brady Statistic AS VP Percent: 7.83 %
Brady Statistic AS VS Percent: 25.3 %
Brady Statistic RA Percent Paced: 62.61 %
Brady Statistic RV Percent Paced: 7.86 %
Date Time Interrogation Session: 20201020152048
Implantable Lead Implant Date: 20151217
Implantable Lead Implant Date: 20151217
Implantable Lead Location: 753859
Implantable Lead Location: 753860
Implantable Lead Model: 5076
Implantable Lead Model: 5076
Implantable Pulse Generator Implant Date: 20151217
Lead Channel Impedance Value: 342 Ohm
Lead Channel Impedance Value: 418 Ohm
Lead Channel Impedance Value: 437 Ohm
Lead Channel Impedance Value: 437 Ohm
Lead Channel Pacing Threshold Amplitude: 0.75 V
Lead Channel Pacing Threshold Amplitude: 0.75 V
Lead Channel Pacing Threshold Pulse Width: 0.4 ms
Lead Channel Pacing Threshold Pulse Width: 0.4 ms
Lead Channel Sensing Intrinsic Amplitude: 0.875 mV
Lead Channel Sensing Intrinsic Amplitude: 0.875 mV
Lead Channel Sensing Intrinsic Amplitude: 9.125 mV
Lead Channel Sensing Intrinsic Amplitude: 9.125 mV
Lead Channel Setting Pacing Amplitude: 2 V
Lead Channel Setting Pacing Amplitude: 2.5 V
Lead Channel Setting Pacing Pulse Width: 0.4 ms
Lead Channel Setting Sensing Sensitivity: 0.9 mV

## 2019-03-04 DIAGNOSIS — H401131 Primary open-angle glaucoma, bilateral, mild stage: Secondary | ICD-10-CM | POA: Diagnosis not present

## 2019-03-17 NOTE — Progress Notes (Signed)
Remote pacemaker transmission.   

## 2019-03-20 ENCOUNTER — Other Ambulatory Visit: Payer: Self-pay | Admitting: Internal Medicine

## 2019-03-20 DIAGNOSIS — I4891 Unspecified atrial fibrillation: Secondary | ICD-10-CM

## 2019-03-21 NOTE — Telephone Encounter (Signed)
Pt last saw Dr Lovena Le 12/03/18, last labs 02/21/19 Creat 1.74 at [facility] per care everywhere, age 84, weight 86.2kg, based on specified criteria pt is on appropriate dosage of Eliquis 2.5mg  BID.  Will refill rx.

## 2019-03-22 DIAGNOSIS — K219 Gastro-esophageal reflux disease without esophagitis: Secondary | ICD-10-CM | POA: Diagnosis not present

## 2019-03-22 DIAGNOSIS — Z794 Long term (current) use of insulin: Secondary | ICD-10-CM | POA: Diagnosis not present

## 2019-03-22 DIAGNOSIS — N184 Chronic kidney disease, stage 4 (severe): Secondary | ICD-10-CM | POA: Diagnosis not present

## 2019-03-22 DIAGNOSIS — E039 Hypothyroidism, unspecified: Secondary | ICD-10-CM | POA: Diagnosis not present

## 2019-03-22 DIAGNOSIS — M810 Age-related osteoporosis without current pathological fracture: Secondary | ICD-10-CM | POA: Diagnosis not present

## 2019-03-22 DIAGNOSIS — E1122 Type 2 diabetes mellitus with diabetic chronic kidney disease: Secondary | ICD-10-CM | POA: Diagnosis not present

## 2019-05-18 DIAGNOSIS — Z23 Encounter for immunization: Secondary | ICD-10-CM | POA: Diagnosis not present

## 2019-05-18 DIAGNOSIS — D485 Neoplasm of uncertain behavior of skin: Secondary | ICD-10-CM | POA: Diagnosis not present

## 2019-06-06 ENCOUNTER — Ambulatory Visit (INDEPENDENT_AMBULATORY_CARE_PROVIDER_SITE_OTHER): Payer: Medicare Other | Admitting: *Deleted

## 2019-06-06 DIAGNOSIS — I480 Paroxysmal atrial fibrillation: Secondary | ICD-10-CM

## 2019-06-07 ENCOUNTER — Telehealth: Payer: Self-pay

## 2019-06-07 LAB — CUP PACEART REMOTE DEVICE CHECK
Battery Remaining Longevity: 49 mo
Battery Voltage: 2.99 V
Brady Statistic AP VP Percent: 0.33 %
Brady Statistic AP VS Percent: 51.56 %
Brady Statistic AS VP Percent: 13.62 %
Brady Statistic AS VS Percent: 34.5 %
Brady Statistic RA Percent Paced: 47.69 %
Brady Statistic RV Percent Paced: 13.64 %
Date Time Interrogation Session: 20210126122911
Implantable Lead Implant Date: 20151217
Implantable Lead Implant Date: 20151217
Implantable Lead Location: 753859
Implantable Lead Location: 753860
Implantable Lead Model: 5076
Implantable Lead Model: 5076
Implantable Pulse Generator Implant Date: 20151217
Lead Channel Impedance Value: 361 Ohm
Lead Channel Impedance Value: 437 Ohm
Lead Channel Impedance Value: 437 Ohm
Lead Channel Impedance Value: 456 Ohm
Lead Channel Pacing Threshold Amplitude: 0.75 V
Lead Channel Pacing Threshold Amplitude: 0.875 V
Lead Channel Pacing Threshold Pulse Width: 0.4 ms
Lead Channel Pacing Threshold Pulse Width: 0.4 ms
Lead Channel Sensing Intrinsic Amplitude: 0.625 mV
Lead Channel Sensing Intrinsic Amplitude: 0.625 mV
Lead Channel Sensing Intrinsic Amplitude: 8.625 mV
Lead Channel Sensing Intrinsic Amplitude: 8.625 mV
Lead Channel Setting Pacing Amplitude: 2 V
Lead Channel Setting Pacing Amplitude: 2.5 V
Lead Channel Setting Pacing Pulse Width: 0.4 ms
Lead Channel Setting Sensing Sensitivity: 0.9 mV

## 2019-06-07 NOTE — Telephone Encounter (Signed)
Spoke with patient to remind of missed remote transmission 

## 2019-06-15 ENCOUNTER — Other Ambulatory Visit: Payer: Self-pay | Admitting: Physician Assistant

## 2019-06-17 DIAGNOSIS — E7800 Pure hypercholesterolemia, unspecified: Secondary | ICD-10-CM | POA: Diagnosis not present

## 2019-06-17 DIAGNOSIS — E1165 Type 2 diabetes mellitus with hyperglycemia: Secondary | ICD-10-CM | POA: Diagnosis not present

## 2019-06-17 DIAGNOSIS — M810 Age-related osteoporosis without current pathological fracture: Secondary | ICD-10-CM | POA: Diagnosis not present

## 2019-06-17 DIAGNOSIS — E1122 Type 2 diabetes mellitus with diabetic chronic kidney disease: Secondary | ICD-10-CM | POA: Diagnosis not present

## 2019-06-17 DIAGNOSIS — I4891 Unspecified atrial fibrillation: Secondary | ICD-10-CM | POA: Diagnosis not present

## 2019-06-17 DIAGNOSIS — E785 Hyperlipidemia, unspecified: Secondary | ICD-10-CM | POA: Diagnosis not present

## 2019-06-17 DIAGNOSIS — I129 Hypertensive chronic kidney disease with stage 1 through stage 4 chronic kidney disease, or unspecified chronic kidney disease: Secondary | ICD-10-CM | POA: Diagnosis not present

## 2019-06-17 DIAGNOSIS — N184 Chronic kidney disease, stage 4 (severe): Secondary | ICD-10-CM | POA: Diagnosis not present

## 2019-06-17 DIAGNOSIS — E039 Hypothyroidism, unspecified: Secondary | ICD-10-CM | POA: Diagnosis not present

## 2019-06-23 ENCOUNTER — Ambulatory Visit (INDEPENDENT_AMBULATORY_CARE_PROVIDER_SITE_OTHER): Payer: Medicare Other | Admitting: Internal Medicine

## 2019-06-23 ENCOUNTER — Other Ambulatory Visit: Payer: Self-pay

## 2019-06-23 ENCOUNTER — Encounter: Payer: Self-pay | Admitting: Internal Medicine

## 2019-06-23 VITALS — BP 156/78 | HR 112 | Ht 63.0 in | Wt 194.0 lb

## 2019-06-23 DIAGNOSIS — Z950 Presence of cardiac pacemaker: Secondary | ICD-10-CM

## 2019-06-23 DIAGNOSIS — I495 Sick sinus syndrome: Secondary | ICD-10-CM

## 2019-06-23 DIAGNOSIS — I480 Paroxysmal atrial fibrillation: Secondary | ICD-10-CM | POA: Diagnosis not present

## 2019-06-23 MED ORDER — AMIODARONE HCL 200 MG PO TABS: 200.0000 mg | ORAL_TABLET | Freq: Two times a day (BID) | ORAL | 3 refills | Status: DC

## 2019-06-23 NOTE — Patient Instructions (Addendum)
Medication Instructions:  Your physician has recommended you make the following change in your medication:   1.  Stop taking dofetilide  2.  Start taking amiodarone 200 mg-  Take one tablet by mouth twice a day until your follow up appointment  Labwork: None ordered.  Testing/Procedures: None ordered.  Follow-Up: Your physician wants you to follow-up in: 6 weeks with Dr. Lovena Le.  August 10, 2019 at 3:00 pm  Remote monitoring is used to monitor your Pacemaker from home. This monitoring reduces the number of office visits required to check your device to one time per year. It allows [place] to keep an eye on the functioning of your device to ensure it is working properly. You are scheduled for a device check from home on 09/05/2019. You may send your transmission at any time that day. If you have a wireless device, the transmission will be sent automatically. After your physician reviews your transmission, you will receive a postcard with your next transmission date.  Any Other Special Instructions Will Be Listed Below (If Applicable).  If you need a refill on your cardiac medications before your next appointment, please call your pharmacy.    Amiodarone tablets What is this medicine? AMIODARONE (a MEE oh da rone) is an antiarrhythmic drug. It helps make your heart beat regularly. Because of the side effects caused by this medicine, it is only used when other medicines have not worked. It is usually used for heartbeat problems that may be life threatening. This medicine may be used for other purposes; ask your health care provider or pharmacist if you have questions. COMMON BRAND NAME(S): Cordarone, Pacerone What should I tell my health care provider before I take this medicine? They need to know if you have any of these conditions:  liver disease  lung disease  other heart problems  thyroid disease  an unusual or allergic reaction to amiodarone, iodine, other medicines, foods, dyes,  or preservatives  pregnant or trying to get pregnant  breast-feeding How should I use this medicine? Take this medicine by mouth with a glass of water. Follow the directions on the prescription label. You can take this medicine with or without food. However, you should always take it the same way each time. Take your doses at regular intervals. Do not take your medicine more often than directed. Do not stop taking except on the advice of your doctor or health care professional. A special MedGuide will be given to you by the pharmacist with each prescription and refill. Be sure to read this information carefully each time. Talk to your pediatrician regarding the use of this medicine in children. Special care may be needed. Overdosage: If you think you have taken too much of this medicine contact a poison control center or emergency room at once. NOTE: This medicine is only for you. Do not share this medicine with others. What if I miss a dose? If you miss a dose, take it as soon as you can. If it is almost time for your next dose, take only that dose. Do not take double or extra doses. What may interact with this medicine? Do not take this medicine with any of the following medications:  abarelix  apomorphine  arsenic trioxide  certain antibiotics like erythromycin, gemifloxacin, levofloxacin, pentamidine  certain medicines for depression like amoxapine, tricyclic antidepressants  certain medicines for fungal infections like fluconazole, itraconazole, ketoconazole, posaconazole, voriconazole  certain medicines for irregular heart beat like disopyramide, dronedarone, ibutilide, propafenone, sotalol  certain medicines  for malaria like chloroquine, halofantrine  cisapride  droperidol  haloperidol  hawthorn  maprotiline  methadone  phenothiazines like chlorpromazine, mesoridazine, thioridazine  pimozide  ranolazine  red yeast rice  vardenafil This medicine may also  interact with the following medications:  antiviral medicines for HIV or AIDS  certain medicines for blood pressure, heart disease, irregular heart beat  certain medicines for cholesterol like atorvastatin, cerivastatin, lovastatin, simvastatin  certain medicines for hepatitis C like sofosbuvir and ledipasvir; sofosbuvir  certain medicines for seizures like phenytoin  certain medicines for thyroid problems  certain medicines that treat or prevent blood clots like warfarin  cholestyramine  cimetidine  clopidogrel  cyclosporine  dextromethorphan  diuretics  dofetilide  fentanyl  general anesthetics  grapefruit juice  lidocaine  loratadine  methotrexate  other medicines that prolong the QT interval (cause an abnormal heart rhythm)  procainamide  quinidine  rifabutin, rifampin, or rifapentine  St. John's Wort  trazodone  ziprasidone This list may not describe all possible interactions. Give your health care provider a list of all the medicines, herbs, non-prescription drugs, or dietary supplements you use. Also tell them if you smoke, drink alcohol, or use illegal drugs. Some items may interact with your medicine. What should I watch for while using this medicine? Your condition will be monitored closely when you first begin therapy. Often, this drug is first started in a hospital or other monitored health care setting. Once you are on maintenance therapy, visit your doctor or health care professional for regular checks on your progress. Because your condition and use of this medicine carry some risk, it is a good idea to carry an identification card, necklace or bracelet with details of your condition, medications, and doctor or health care professional. Kathryn Richardson may get drowsy or dizzy. Do not drive, use machinery, or do anything that needs mental alertness until you know how this medicine affects you. Do not stand or sit up quickly, especially if you are an older  patient. This reduces the risk of dizzy or fainting spells. This medicine can make you more sensitive to the sun. Keep out of the sun. If you cannot avoid being in the sun, wear protective clothing and use sunscreen. Do not use sun lamps or tanning beds/booths. You should have regular eye exams before and during treatment. Call your doctor if you have blurred vision, see halos, or your eyes become sensitive to light. Your eyes may get dry. It may be helpful to use a lubricating eye solution or artificial tears solution. If you are going to have surgery or a procedure that requires contrast dyes, tell your doctor or health care professional that you are taking this medicine. What side effects may I notice from receiving this medicine? Side effects that you should report to your doctor or health care professional as soon as possible:  allergic reactions like skin rash, itching or hives, swelling of the face, lips, or tongue  blue-gray coloring of the skin  blurred vision, seeing blue green halos, increased sensitivity of the eyes to light  breathing problems  chest pain  dark urine  fast, irregular heartbeat  feeling faint or light-headed  intolerance to heat or cold  nausea or vomiting  pain and swelling of the scrotum  pain, tingling, numbness in feet, hands  redness, blistering, peeling or loosening of the skin, including inside the mouth  spitting up blood  stomach pain  sweating  unusual or uncontrolled movements of body  unusually weak or tired  weight gain or loss  yellowing of the eyes or skin Side effects that usually do not require medical attention (report to your doctor or health care professional if they continue or are bothersome):  change in sex drive or performance  constipation  dizziness  headache  loss of appetite  trouble sleeping This list may not describe all possible side effects. Call your doctor for medical advice about side effects.  You may report side effects to FDA at 1-800-FDA-1088. Where should I keep my medicine? Keep out of the reach of children. Store at room temperature between 20 and 25 degrees C (68 and 77 degrees F). Protect from light. Keep container tightly closed. Throw away any unused medicine after the expiration date. NOTE: This sheet is a summary. It may not cover all possible information. If you have questions about this medicine, talk to your doctor, pharmacist, or health care provider.  2020 Elsevier/Gold Standard (2018-03-31 13:44:04)

## 2019-06-23 NOTE — Progress Notes (Signed)
HPI Ms. Dykes returns today for followup. She is a pleasant 85 yo woman with sinus node dysfunctiona d PAF. She has continued to have worsening control of her atrial fib based on her device interrogation. As time has gone on she has gone from mostly NSR to being out of rhythm over 50% of the time.  Allergies  Allergen Reactions  . Cortisone Other (See Comments)    Hyperglycemia   . Prednisone Other (See Comments)    Hyperglycemia      Current Outpatient Medications  Medication Sig Dispense Refill  . allopurinol (ZYLOPRIM) 100 MG tablet Take 200 mg by mouth at bedtime.     Marland Kitchen atorvastatin (LIPITOR) 80 MG tablet Take 80 mg by mouth at bedtime.     . benazepril (LOTENSIN) 20 MG tablet Take 20 mg by mouth daily.    Marland Kitchen diltiazem (CARDIZEM CD) 360 MG 24 hr capsule Take 1 capsule (360 mg total) by mouth daily. 90 capsule 3  . diltiazem (CARDIZEM) 30 MG tablet Take 1 tablet every 4 hours AS NEEDED for afib heart rate 90 or greater. 90 tablet 3  . dofetilide (TIKOSYN) 125 MCG capsule TAKE 1 CAPSULE(125 MCG) BY MOUTH TWICE DAILY 180 capsule 3  . ELIQUIS 2.5 MG TABS tablet TAKE 1 TABLET BY MOUTH TWICE A DAY 60 tablet 6  . furosemide (LASIX) 40 MG tablet Take 40 mg by mouth daily.    Marland Kitchen HUMALOG KWIKPEN 100 UNIT/ML KwikPen Inject 8 Units into the skin 3 (three) times daily.    Marland Kitchen KLOR-CON M20 20 MEQ tablet TAKE 1 AND 1/2 TABLETS BY MOUTH EVERY DAY. 45 tablet 4  . LANTUS SOLOSTAR 100 UNIT/ML Solostar Pen Inject 62 Units into the skin daily.    Marland Kitchen levothyroxine (SYNTHROID, LEVOTHROID) 50 MCG tablet Take 50 mcg by mouth daily. On a empty stomach.    . rosuvastatin (CRESTOR) 20 MG tablet Take 1 tablet by mouth daily.     No current facility-administered medications for this visit.     Past Medical History:  Diagnosis Date  . Allergic rhinitis   . Atrial fibrillation ([place])   . Chronic renal disease, stage IV ([place])   . Essential hypertension   . GERD (gastroesophageal reflux disease)   .  Gout   . Hyperlipemia, mixed   . Insomnia   . Type 2 diabetes mellitus (H[facility])     ROS:   All systems reviewed and negative except as noted in the HPI.   Past Surgical History:  Procedure Laterality Date  . CARDIOVERSION N/A 05/29/2014   Procedure: CARDIOVERSION;  Surgeon: Fay Records, MD;  [facility] END[facility]COPY;  Service: Cardiovascular;  Laterality: N/A;  . CARDIOVERSION N/A 11/03/2014   Procedure: CARDIOVERSION;  Surgeon: Thayer Headings, MD;  Location: [place];  Service: Cardiovascular;  Laterality: N/A;  . CARDIOVERSION N/A 04/14/2016   Procedure: CARDIOVERSION;  Surgeon: Jerline Pain, MD;  Location: [place];  Service: Cardiovascular;  Laterality: N/A;  . CESAREAN SECTION     x2  . ESOPHAGEAL DILATION    . PERMANENT PACEMAKER INSERTION N/A 04/26/2014   Procedure: PERMANENT PACEMAKER INSERTION;  Surgeon: Evans Lance, MD;  Location: [facility] CATH LAB;  Service: Cardiovascular;  Laterality: N/A;  . TEE WITHOUT CARDIOVERSION N/A 05/29/2014   Procedure: TRANSESOPHAGEAL ECHOCARDIOGRAM (TEE);  Surgeon: Fay Records, MD;  [facility];  Service: Cardiovascular;  Laterality: N/A;     Family History  Problem Relation Age of Onset  . Coronary  artery disease Father 69  . Heart attack Father   . Congestive Heart Failure Mother   . Alzheimer's disease Mother      Social History   Socioeconomic History  . Marital status: Married    Spouse name: Not on file  . Number of children: Not on file  . Years of education: Not on file  . Highest education level: Not on file  Occupational History  . Not on file  Tobacco Use  . Smoking status: Never Smoker  . Smokeless tobacco: Never Used  Substance and Sexual Activity  . Alcohol use: No    Alcohol/week: 0.0 standard drinks  . Drug use: No  . Sexual activity: Yes    Birth control/protection: Other-see comments  Other Topics Concern  . Not on file  Social History Narrative   Lives in [place].  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 Difficulty of Paying Living Expenses: Not on file  Food Insecurity:   . Worried About Charity fundraiser in the Last Year: Not on file  . Ran Out of Food in the Last Year: Not on file  Transportation Needs:   . Lack of Transportation (Medical): Not on file  . Lack of Transportation (Non-Medical): Not on file  Physical Activity:   . Days of Exercise per Week: Not on file  . Minutes of Exercise per Session: Not on file  Stress:   . Feeling of Stress : Not on file  Social Connections:   . Frequency of Communication with Friends and Family: Not on file  . Frequency of Social Gatherings with Friends and Family: Not on file  . Attends Religious Services: Not on file  . Active Member of Clubs or Organizations: Not on file  . Attends Archivist Meetings: Not on file  . Marital Status: Not on file  Intimate Partner Violence:   . Fear of Current or Ex-Partner: Not on file  . Emotionally Abused: Not on file  . Physically Abused: Not on file  . Sexually Abused: Not on file     BP (!) 156/78   Pulse (!) 112   Ht 5\' 3"  (1.6 m)   Wt 194 lb (88 kg)   SpO2 97%   BMI 34.37 kg/m   Physical Exam:  Well appearing but obese 85 yo woman, NAD HEENT: Unremarkable Neck:  7 cm JVD, no thyromegally Lymphatics:  No adenopathy Back:  No CVA tenderness Lungs:  Clear with no wheezes HEART:  IRegular tachy rhythm, no murmurs, no rubs, no clicks Abd:  soft, positive bowel sounds, no organomegally, no rebound, no guarding Ext:  2 plus pulses, no edema, no cyanosis, no clubbing Skin:  No rashes no nodules Neuro:  CN II through XII intact, motor grossly intact  EKG - atrial fib with a RVR and PVC's  DEVICE  Normal device function.  See PaceArt for details.   Assess/Plan: 1. Uncontrolled atrial fib - she has had progressively more atrial fib. I have offered her initiation of amio  vs AV node ablation. She will start amio 200 bid and reduce in 4 weeks to 200 daily. She will stop dofetilide 2. Obesity - she is encouraged to lose weight. 3. HTN -her bp is elevated today.  4. Sinus node dysfunction Ocie Cornfield - her medtronic DDD PM is working normally. We will recheck in several months.  Mikle Bosworth.D.

## 2019-07-21 DIAGNOSIS — L570 Actinic keratosis: Secondary | ICD-10-CM | POA: Diagnosis not present

## 2019-07-21 DIAGNOSIS — D2261 Melanocytic nevi of right upper limb, including shoulder: Secondary | ICD-10-CM | POA: Diagnosis not present

## 2019-07-21 DIAGNOSIS — Z85828 Personal history of other malignant neoplasm of skin: Secondary | ICD-10-CM | POA: Diagnosis not present

## 2019-07-21 DIAGNOSIS — L720 Epidermal cyst: Secondary | ICD-10-CM | POA: Diagnosis not present

## 2019-07-21 DIAGNOSIS — L821 Other seborrheic keratosis: Secondary | ICD-10-CM | POA: Diagnosis not present

## 2019-07-21 DIAGNOSIS — L578 Other skin changes due to chronic exposure to nonionizing radiation: Secondary | ICD-10-CM | POA: Diagnosis not present

## 2019-07-21 DIAGNOSIS — Z23 Encounter for immunization: Secondary | ICD-10-CM | POA: Diagnosis not present

## 2019-07-21 DIAGNOSIS — Z86018 Personal history of other benign neoplasm: Secondary | ICD-10-CM | POA: Diagnosis not present

## 2019-08-08 DIAGNOSIS — N184 Chronic kidney disease, stage 4 (severe): Secondary | ICD-10-CM | POA: Diagnosis not present

## 2019-08-08 DIAGNOSIS — E1165 Type 2 diabetes mellitus with hyperglycemia: Secondary | ICD-10-CM | POA: Diagnosis not present

## 2019-08-08 DIAGNOSIS — E1122 Type 2 diabetes mellitus with diabetic chronic kidney disease: Secondary | ICD-10-CM | POA: Diagnosis not present

## 2019-08-08 DIAGNOSIS — E039 Hypothyroidism, unspecified: Secondary | ICD-10-CM | POA: Diagnosis not present

## 2019-08-08 DIAGNOSIS — E7800 Pure hypercholesterolemia, unspecified: Secondary | ICD-10-CM | POA: Diagnosis not present

## 2019-08-08 DIAGNOSIS — E785 Hyperlipidemia, unspecified: Secondary | ICD-10-CM | POA: Diagnosis not present

## 2019-08-08 DIAGNOSIS — N1832 Chronic kidney disease, stage 3b: Secondary | ICD-10-CM | POA: Diagnosis not present

## 2019-08-08 DIAGNOSIS — I4891 Unspecified atrial fibrillation: Secondary | ICD-10-CM | POA: Diagnosis not present

## 2019-08-08 DIAGNOSIS — R600 Localized edema: Secondary | ICD-10-CM | POA: Diagnosis not present

## 2019-08-08 DIAGNOSIS — Z794 Long term (current) use of insulin: Secondary | ICD-10-CM | POA: Diagnosis not present

## 2019-08-08 DIAGNOSIS — E038 Other specified hypothyroidism: Secondary | ICD-10-CM | POA: Diagnosis not present

## 2019-08-08 DIAGNOSIS — I129 Hypertensive chronic kidney disease with stage 1 through stage 4 chronic kidney disease, or unspecified chronic kidney disease: Secondary | ICD-10-CM | POA: Diagnosis not present

## 2019-08-08 DIAGNOSIS — D631 Anemia in chronic kidney disease: Secondary | ICD-10-CM | POA: Diagnosis not present

## 2019-08-08 DIAGNOSIS — Z79899 Other long term (current) drug therapy: Secondary | ICD-10-CM | POA: Diagnosis not present

## 2019-08-08 DIAGNOSIS — M810 Age-related osteoporosis without current pathological fracture: Secondary | ICD-10-CM | POA: Diagnosis not present

## 2019-08-08 DIAGNOSIS — G4700 Insomnia, unspecified: Secondary | ICD-10-CM | POA: Diagnosis not present

## 2019-08-10 ENCOUNTER — Other Ambulatory Visit: Payer: Self-pay

## 2019-08-10 ENCOUNTER — Encounter: Payer: Self-pay | Admitting: Internal Medicine

## 2019-08-10 ENCOUNTER — Ambulatory Visit (INDEPENDENT_AMBULATORY_CARE_PROVIDER_SITE_OTHER): Payer: Medicare Other | Admitting: Internal Medicine

## 2019-08-10 VITALS — BP 144/84 | HR 91 | Ht 63.0 in | Wt 196.0 lb

## 2019-08-10 DIAGNOSIS — I480 Paroxysmal atrial fibrillation: Secondary | ICD-10-CM | POA: Diagnosis not present

## 2019-08-10 DIAGNOSIS — I495 Sick sinus syndrome: Secondary | ICD-10-CM | POA: Diagnosis not present

## 2019-08-10 DIAGNOSIS — Z950 Presence of cardiac pacemaker: Secondary | ICD-10-CM | POA: Diagnosis not present

## 2019-08-10 DIAGNOSIS — M810 Age-related osteoporosis without current pathological fracture: Secondary | ICD-10-CM | POA: Diagnosis not present

## 2019-08-10 LAB — CUP PACEART INCLINIC DEVICE CHECK
Battery Remaining Longevity: 38 mo
Battery Voltage: 2.98 V
Brady Statistic RA Percent Paced: 0.1 %
Brady Statistic RV Percent Paced: 24.43 %
Date Time Interrogation Session: 20210331152300
Implantable Lead Implant Date: 20151217
Implantable Lead Implant Date: 20151217
Implantable Lead Location: 753859
Implantable Lead Location: 753860
Implantable Lead Model: 5076
Implantable Lead Model: 5076
Implantable Pulse Generator Implant Date: 20151217
Lead Channel Impedance Value: 342 Ohm
Lead Channel Impedance Value: 418 Ohm
Lead Channel Impedance Value: 456 Ohm
Lead Channel Impedance Value: 456 Ohm
Lead Channel Sensing Intrinsic Amplitude: 0.4 mV
Lead Channel Sensing Intrinsic Amplitude: 9.6 mV
Lead Channel Setting Pacing Amplitude: 2 V
Lead Channel Setting Pacing Amplitude: 2.5 V
Lead Channel Setting Pacing Pulse Width: 0.4 ms
Lead Channel Setting Sensing Sensitivity: 0.9 mV

## 2019-08-10 NOTE — Progress Notes (Signed)
HPI Kathryn Richardson returns today for followup. She is a pleasant 85 yo woman with a h/o HTN and PAF who I saw several weeks ago and started her on amiodarone. She has taken 400 mg daily. She still has atrial fib and dyspnea with any exertion. Her rates are better but still rapid. She has not had chest pain. She has renal insufficiency and peripheral edema.  Allergies  Allergen Reactions  . Cortisone Other (See Comments)    Hyperglycemia   . Prednisone Other (See Comments)    Hyperglycemia      Current Outpatient Medications  Medication Sig Dispense Refill  . allopurinol (ZYLOPRIM) 100 MG tablet Take 200 mg by mouth at bedtime.     Marland Kitchen amiodarone (PACERONE) 200 MG tablet Take 1 tablet (200 mg total) by mouth 2 (two) times daily. 180 tablet 3  . atorvastatin (LIPITOR) 80 MG tablet Take 80 mg by mouth at bedtime.     . benazepril (LOTENSIN) 20 MG tablet Take 20 mg by mouth daily.    Marland Kitchen diltiazem (CARDIZEM CD) 360 MG 24 hr capsule Take 1 capsule (360 mg total) by mouth daily. 90 capsule 3  . diltiazem (CARDIZEM) 30 MG tablet Take 1 tablet every 4 hours AS NEEDED for afib heart rate 90 or greater. 90 tablet 3  . ELIQUIS 2.5 MG TABS tablet TAKE 1 TABLET BY MOUTH TWICE A DAY 60 tablet 6  . furosemide (LASIX) 40 MG tablet Take 1 tablet by mouth in the morning and at bedtime.    Marland Kitchen HUMALOG KWIKPEN 100 UNIT/ML KwikPen Inject 8 Units into the skin 3 (three) times daily.    Marland Kitchen KLOR-CON M20 20 MEQ tablet TAKE 1 AND 1/2 TABLETS BY MOUTH EVERY DAY. 45 tablet 4  . LANTUS SOLOSTAR 100 UNIT/ML Solostar Pen Inject 62 Units into the skin daily.    Marland Kitchen levothyroxine (SYNTHROID, LEVOTHROID) 50 MCG tablet Take 50 mcg by mouth daily. On a empty stomach.    . rosuvastatin (CRESTOR) 20 MG tablet Take 1 tablet by mouth daily.     No current facility-administered medications for this visit.     Past Medical History:  Diagnosis Date  . Allergic rhinitis   . Atrial fibrillation ([place])   . Chronic renal disease,  stage IV ([place])   . Essential hypertension   . GERD (gastroesophageal reflux disease)   . Gout   . Hyperlipemia, mixed   . Insomnia   . Type 2 diabetes mellitus (HCC)     ROS:   All systems reviewed and negative except as noted in the HPI.   Past Surgical History:  Procedure Laterality Date  . CARDIOVERSION N/A 05/29/2014   Procedure: CARDIOVERSION;  Surgeon: Fay Records, MD;  Location: [facility] ENDOSCOPY;  Service: Cardiovascular;  Laterality: N/A;  . CARDIOVERSION N/A 11/03/2014   Procedure: CARDIOVERSION;  Surgeon: Thayer Headings, MD;  Location: [place];  Service: Cardiovascular;  Laterality: N/A;  . CARDIOVERSION N/A 04/14/2016   Procedure: CARDIOVERSION;  Surgeon: Jerline Pain, MD;  Location: [place];  Service: Cardiovascular;  Laterality: N/A;  . CESAREAN SECTION     x2  . ESOPHAGEAL DILATION    . PERMANENT PACEMAKER INSERTION N/A 04/26/2014   Procedure: PERMANENT PACEMAKER INSERTION;  Surgeon: Evans Lance, MD;  Location: [facility] CATH LAB;  Service: Cardiovascular;  Laterality: N/A;  . TEE WITHOUT CARDIOVERSION N/A 05/29/2014   Procedure: TRANSESOPHAGEAL ECHOCARDIOGRAM (TEE);  Surgeon: Fay Records, MD;  Location: [place];  Service:  Cardiovascular;  Laterality: N/A;     Family History  Problem Relation Age of Onset  . Coronary artery disease Father 25  . Heart attack Father   . Congestive Heart Failure Mother   . Alzheimer's disease Mother      Social History   Socioeconomic History  . Marital status: Married    Spouse name: Not on file  . Number of children: Not on file  . Years of education: Not on file  . Highest education level: Not on file  Occupational History  . Not on file  Tobacco Use  . Smoking status: Never Smoker  . Smokeless tobacco: Never Used  Substance and Sexual Activity  . Alcohol use: No    Alcohol/week: 0.0 standard drinks  . Drug use: No  . Sexual activity: Yes    Birth control/protection: Other-see comments  Other Topics  Concern  . Not on file  Social History Narrative   Lives in [place].  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 Difficulty of Paying Living Expenses:   Food Insecurity:   . Worried About Charity fundraiser in the Last Year:   . Arboriculturist in the Last Year:   Transportation Needs:   . Film/video editor (Medical):   Marland Kitchen Lack of Transportation (Non-Medical):   Physical Activity:   . Days of Exercise per Week:   . Minutes of Exercise per Session:   Stress:   . Feeling of Stress :   Social Connections:   . Frequency of Communication with Friends and Family:   . Frequency of Social Gatherings with Friends and Family:   . Attends Religious Services:   . Active Member of Clubs or Organizations:   . Attends Archivist Meetings:   Marland Kitchen Marital Status:   Intimate Partner Violence:   . Fear of Current or Ex-Partner:   . Emotionally Abused:   Marland Kitchen Physically Abused:   . Sexually Abused:      BP (!) 144/84   Pulse 91   Ht 5\' 3"  (1.6 m)   Wt 196 lb (88.9 kg)   SpO2 97%   BMI 34.72 kg/m   Physical Exam:  Well appearing NAD HEENT: Unremarkable Neck:  No JVD, no thyromegally Lymphatics:  No adenopathy Back:  No CVA tenderness Lungs:  Clear with no wheezes HEART:  IRegular rate rhythm, no murmurs, no rubs, no clicks Abd:  soft, positive bowel sounds, no organomegally, no rebound, no guarding Ext:  2 plus pulses, no edema, no cyanosis, no clubbing Skin:  No rashes no nodules Neuro:  CN II through XII intact, motor grossly intact  EKG - atrial fib with a  Rapid VR   DEVICE  Normal device function.  See PaceArt for details.   Assess/Plan: 1. Persistent atrial fib - I have discussed the treatment options with the patient. She will undergo DCCV next week. I encouraged her not to miss any blood thinners. 2. Diastolic CHF - her symptoms are class 3. She is encouraged to avoid salty food.  Hopefully she will be better when she is back in NSR. 4. HTN - her sbp is minimally elevated. I would imagine adjustin her meds after the DCCV.  Mikle Bosworth.D.

## 2019-08-10 NOTE — Patient Instructions (Addendum)
Medication Instructions:  Your physician recommends that you continue on your current medications as directed. Please refer to the Current Medication list given to you today.  Labwork: You will get lab work today:  BMP and CBC  Testing/Procedures: Your physician has recommended that you have a Cardioversion (DCCV). Electrical Cardioversion uses a jolt of electricity to your heart either through paddles or wired patches attached to your chest. This is a controlled, usually prescheduled, procedure. Defibrillation is done under light anesthesia in the hospital, and you usually go home the day of the procedure. This is done to get your heart back into a normal rhythm. You are not awake for the procedure. Please see the instruction sheet given to you today.  Follow-Up: Your physician wants you to follow-up in: 4 weeks with Dr. Lovena Le.    September 06, 2019 at 3:45 pm   Remote monitoring is used to monitor your Pacemaker from home. This monitoring reduces the number of office visits required to check your device to one time per year. It allows [place] to keep an eye on the functioning of your device to ensure it is working properly. You are scheduled for a device check from home on 09/05/2019. You may send your transmission at any time that day. If you have a wireless device, the transmission will be sent automatically. After your physician reviews your transmission, you will receive a postcard with your next transmission date.  COVID TEST August 13, 2019 BETWEEN 9-12 PM AT [address].  You are scheduled for a Cardioversion on August 15, 2019 with Dr. Oval Linsey.  Please arrive at the [facility] (Main Entrance A) at [facility]: [address] [place], [place] [address] at 7:30 AM  DIET: Nothing to eat or drink after midnight except a sip of water with medications (see medication instructions below)  Medication Instructions: Take all of your normal morning medications with a sip of water EXCEPT  LASIX or INSULIN  Labs:  Today  You must have a responsible person to drive you home and stay in the waiting area during your procedure. Failure to do so could result in cancellation.  Bring your insurance cards.  *Special Note: Every effort is made to have your procedure done on time. Occasionally there are emergencies that occur at the hospital that may cause delays. Please be patient if a delay does occur.

## 2019-08-10 NOTE — H&P (View-Only) (Signed)
HPI Kathryn Richardson returns today for followup. She is a pleasant 85 yo woman with a h/o HTN and PAF who I saw several weeks ago and started her on amiodarone. She has taken 400 mg daily. She still has atrial fib and dyspnea with any exertion. Her rates are better but still rapid. She has not had chest pain. She has renal insufficiency and peripheral edema.  Allergies  Allergen Reactions  . Cortisone Other (See Comments)    Hyperglycemia   . Prednisone Other (See Comments)    Hyperglycemia      Current Outpatient Medications  Medication Sig Dispense Refill  . allopurinol (ZYLOPRIM) 100 MG tablet Take 200 mg by mouth at bedtime.     Marland Kitchen amiodarone (PACERONE) 200 MG tablet Take 1 tablet (200 mg total) by mouth 2 (two) times daily. 180 tablet 3  . atorvastatin (LIPITOR) 80 MG tablet Take 80 mg by mouth at bedtime.     . benazepril (LOTENSIN) 20 MG tablet Take 20 mg by mouth daily.    Marland Kitchen diltiazem (CARDIZEM CD) 360 MG 24 hr capsule Take 1 capsule (360 mg total) by mouth daily. 90 capsule 3  . diltiazem (CARDIZEM) 30 MG tablet Take 1 tablet every 4 hours AS NEEDED for afib heart rate 90 or greater. 90 tablet 3  . ELIQUIS 2.5 MG TABS tablet TAKE 1 TABLET BY MOUTH TWICE A DAY 60 tablet 6  . furosemide (LASIX) 40 MG tablet Take 1 tablet by mouth in the morning and at bedtime.    Marland Kitchen HUMALOG KWIKPEN 100 UNIT/ML KwikPen Inject 8 Units into the skin 3 (three) times daily.    Marland Kitchen KLOR-CON M20 20 MEQ tablet TAKE 1 AND 1/2 TABLETS BY MOUTH EVERY DAY. 45 tablet 4  . LANTUS SOLOSTAR 100 UNIT/ML Solostar Pen Inject 62 Units into the skin daily.    Marland Kitchen levothyroxine (SYNTHROID, LEVOTHROID) 50 MCG tablet Take 50 mcg by mouth daily. On a empty stomach.    . rosuvastatin (CRESTOR) 20 MG tablet Take 1 tablet by mouth daily.     No current facility-administered medications for this visit.     Past Medical History:  Diagnosis Date  . Allergic rhinitis   . Atrial fibrillation ([place])   . Chronic renal disease,  stage IV ([place])   . Essential hypertension   . GERD (gastroesophageal reflux disease)   . Gout   . Hyperlipemia, mixed   . Insomnia   . Type 2 diabetes mellitus (HCC)     ROS:   All systems reviewed and negative except as noted in the HPI.   Past Surgical History:  Procedure Laterality Date  . CARDIOVERSION N/A 05/29/2014   Procedure: CARDIOVERSION;  Surgeon: Fay Records, MD;  [facility]: [facility] ENDOSCOPY;  Service: Cardiovascular;  Laterality: N/A;  . CARDIOVERSION N/A 11/03/2014   Procedure: CARDIOVERSION;  Surgeon: Thayer Headings, MD;  Location: [place];  Service: Cardiovascular;  Laterality: N/A;  . CARDIOVERSION N/A 04/14/2016   Procedure: CARDIOVERSION;  Surgeon: Jerline Pain, MD;  Location: [place];  Service: Cardiovascular;  Laterality: N/A;  . CESAREAN SECTION     x2  . ESOPHAGEAL DILATION    . PERMANENT PACEMAKER INSERTION N/A 04/26/2014   Procedure: PERMANENT PACEMAKER INSERTION;  Surgeon: Evans Lance, MD;  Location: [facility] CATH LAB;  Service: Cardiovascular;  Laterality: N/A;  . TEE WITHOUT CARDIOVERSION N/A 05/29/2014   Procedure: TRANSESOPHAGEAL ECHOCARDIOGRAM (TEE);  Surgeon: Fay Records, MD;  Location: [place];  Service:  Cardiovascular;  Laterality: N/A;     Family History  Problem Relation Age of Onset  . Coronary artery disease Father 85  . Heart attack Father   . Congestive Heart Failure Mother   . Alzheimer's disease Mother      Social History   Socioeconomic History  . Marital status: Married    Spouse name: Not on file  . Number of children: Not on file  . Years of education: Not on file  . Highest education level: Not on file  Occupational History  . Not on file  Tobacco Use  . Smoking status: Never Smoker  . Smokeless tobacco: Never Used  Substance and Sexual Activity  . Alcohol use: No    Alcohol/week: 0.0 standard drinks  . Drug use: No  . Sexual activity: Yes    Birth control/protection: Other-see comments  Other Topics  Concern  . Not on file  Social History Narrative   Lives in [place].  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 Difficulty of Paying Living Expenses:   Food Insecurity:   . Worried About Charity fundraiser in the Last Year:   . Arboriculturist in the Last Year:   Transportation Needs:   . Film/video editor (Medical):   Marland Kitchen Lack of Transportation (Non-Medical):   Physical Activity:   . Days of Exercise per Week:   . Minutes of Exercise per Session:   Stress:   . Feeling of Stress :   Social Connections:   . Frequency of Communication with Friends and Family:   . Frequency of Social Gatherings with Friends and Family:   . Attends Religious Services:   . Active Member of Clubs or Organizations:   . Attends Archivist Meetings:   Marland Kitchen Marital Status:   Intimate Partner Violence:   . Fear of Current or Ex-Partner:   . Emotionally Abused:   Marland Kitchen Physically Abused:   . Sexually Abused:      BP (!) 144/84   Pulse 91   Ht 5\' 3"  (1.6 m)   Wt 196 lb (88.9 kg)   SpO2 97%   BMI 34.72 kg/m   Physical Exam:  Well appearing NAD HEENT: Unremarkable Neck:  No JVD, no thyromegally Lymphatics:  No adenopathy Back:  No CVA tenderness Lungs:  Clear with no wheezes HEART:  IRegular rate rhythm, no murmurs, no rubs, no clicks Abd:  soft, positive bowel sounds, no organomegally, no rebound, no guarding Ext:  2 plus pulses, no edema, no cyanosis, no clubbing Skin:  No rashes no nodules Neuro:  CN II through XII intact, motor grossly intact  EKG - atrial fib with a  Rapid VR   DEVICE  Normal device function.  See PaceArt for details.   Assess/Plan: 1. Persistent atrial fib - I have discussed the treatment options with the patient. She will undergo DCCV next week. I encouraged her not to miss any blood thinners. 2. Diastolic CHF - her symptoms are class 3. She is encouraged to avoid salty food.  Hopefully she will be better when she is back in NSR. 4. HTN - her sbp is minimally elevated. I would imagine adjustin her meds after the DCCV.  Mikle Bosworth.D.

## 2019-08-13 ENCOUNTER — Other Ambulatory Visit (HOSPITAL_COMMUNITY)
Admission: RE | Admit: 2019-08-13 | Discharge: 2019-08-13 | Disposition: A | Discharge status: Home / Self Care | Payer: Medicare Other | Source: Ambulatory Visit | Attending: Cardiovascular Disease | Admitting: Cardiovascular Disease

## 2019-08-13 DIAGNOSIS — Z01812 Encounter for preprocedural laboratory examination: Secondary | ICD-10-CM | POA: Diagnosis not present

## 2019-08-13 DIAGNOSIS — Z20822 Contact with and (suspected) exposure to covid-19: Secondary | ICD-10-CM | POA: Insufficient documentation

## 2019-08-13 LAB — SARS CORONAVIRUS 2 (TAT 6-24 HRS): SARS Coronavirus 2: NEGATIVE

## 2019-08-14 NOTE — Anesthesia Preprocedure Evaluation (Addendum)
Anesthesia Evaluation  Patient identified by MRN, date of birth, ID band Patient awake    Reviewed: Allergy & Precautions, NPO status , Patient's Chart, lab work & pertinent test results  Airway Mallampati: II  TM Distance: >3 FB     Dental   Pulmonary    breath sounds clear to auscultation       Cardiovascular hypertension, + pacemaker  Rhythm:Irregular Rate:Normal     Neuro/Psych    GI/Hepatic GERD  ,  Endo/Other  diabetesHypothyroidism   Renal/GU Renal disease     Musculoskeletal   Abdominal   Peds  Hematology   Anesthesia Other Findings   Reproductive/Obstetrics                            Anesthesia Physical Anesthesia Plan  ASA: III  Anesthesia Plan: General   Post-op Pain Management:    Induction: Intravenous  PONV Risk Score and Plan: 3 and Ondansetron and Midazolam  Airway Management Planned: Oral ETT  Additional Equipment:   Intra-op Plan:   Post-operative Plan:   Informed Consent: I have reviewed the patients History and Physical, chart, labs and discussed the procedure including the risks, benefits and alternatives for the proposed anesthesia with the patient or authorized representative who has indicated his/her understanding and acceptance.       Plan Discussed with: Anesthesiologist and CRNA  Anesthesia Plan Comments:        Anesthesia Quick Evaluation

## 2019-08-15 ENCOUNTER — Ambulatory Visit (HOSPITAL_COMMUNITY): Payer: Medicare Other | Admitting: Anesthesiology

## 2019-08-15 ENCOUNTER — Encounter (HOSPITAL_COMMUNITY)
Admission: RE | Disposition: A | Discharge status: Home / Self Care | Payer: Self-pay | Source: Home / Self Care | Attending: Cardiovascular Disease

## 2019-08-15 ENCOUNTER — Ambulatory Visit (HOSPITAL_COMMUNITY)
Admission: RE | Admit: 2019-08-15 | Discharge: 2019-08-15 | Disposition: A | Discharge status: Home / Self Care | Payer: Medicare Other | Attending: Cardiovascular Disease | Admitting: Cardiovascular Disease

## 2019-08-15 ENCOUNTER — Encounter (HOSPITAL_COMMUNITY): Payer: Self-pay | Admitting: Cardiovascular Disease

## 2019-08-15 ENCOUNTER — Other Ambulatory Visit: Payer: Self-pay

## 2019-08-15 DIAGNOSIS — Z888 Allergy status to other drugs, medicaments and biological substances status: Secondary | ICD-10-CM | POA: Diagnosis not present

## 2019-08-15 DIAGNOSIS — Z950 Presence of cardiac pacemaker: Secondary | ICD-10-CM | POA: Insufficient documentation

## 2019-08-15 DIAGNOSIS — E782 Mixed hyperlipidemia: Secondary | ICD-10-CM | POA: Diagnosis not present

## 2019-08-15 DIAGNOSIS — Z8249 Family history of ischemic heart disease and other diseases of the circulatory system: Secondary | ICD-10-CM | POA: Insufficient documentation

## 2019-08-15 DIAGNOSIS — I5030 Unspecified diastolic (congestive) heart failure: Secondary | ICD-10-CM | POA: Insufficient documentation

## 2019-08-15 DIAGNOSIS — I129 Hypertensive chronic kidney disease with stage 1 through stage 4 chronic kidney disease, or unspecified chronic kidney disease: Secondary | ICD-10-CM | POA: Insufficient documentation

## 2019-08-15 DIAGNOSIS — E785 Hyperlipidemia, unspecified: Secondary | ICD-10-CM | POA: Insufficient documentation

## 2019-08-15 DIAGNOSIS — Z794 Long term (current) use of insulin: Secondary | ICD-10-CM | POA: Diagnosis not present

## 2019-08-15 DIAGNOSIS — E039 Hypothyroidism, unspecified: Secondary | ICD-10-CM | POA: Diagnosis not present

## 2019-08-15 DIAGNOSIS — K219 Gastro-esophageal reflux disease without esophagitis: Secondary | ICD-10-CM | POA: Insufficient documentation

## 2019-08-15 DIAGNOSIS — G4700 Insomnia, unspecified: Secondary | ICD-10-CM | POA: Insufficient documentation

## 2019-08-15 DIAGNOSIS — M109 Gout, unspecified: Secondary | ICD-10-CM | POA: Diagnosis not present

## 2019-08-15 DIAGNOSIS — I4819 Other persistent atrial fibrillation: Secondary | ICD-10-CM | POA: Diagnosis not present

## 2019-08-15 DIAGNOSIS — E1122 Type 2 diabetes mellitus with diabetic chronic kidney disease: Secondary | ICD-10-CM | POA: Insufficient documentation

## 2019-08-15 DIAGNOSIS — N184 Chronic kidney disease, stage 4 (severe): Secondary | ICD-10-CM | POA: Diagnosis not present

## 2019-08-15 DIAGNOSIS — Z79899 Other long term (current) drug therapy: Secondary | ICD-10-CM | POA: Insufficient documentation

## 2019-08-15 DIAGNOSIS — Z7901 Long term (current) use of anticoagulants: Secondary | ICD-10-CM | POA: Diagnosis not present

## 2019-08-15 DIAGNOSIS — Z79890 Hormone replacement therapy (postmenopausal): Secondary | ICD-10-CM | POA: Insufficient documentation

## 2019-08-15 DIAGNOSIS — N183 Chronic kidney disease, stage 3 unspecified: Secondary | ICD-10-CM | POA: Diagnosis not present

## 2019-08-15 DIAGNOSIS — I480 Paroxysmal atrial fibrillation: Secondary | ICD-10-CM | POA: Diagnosis not present

## 2019-08-15 HISTORY — PX: CARDIOVERSION: SHX1299

## 2019-08-15 LAB — POCT I-STAT, CHEM 8
BUN: 31 mg/dL — ABNORMAL HIGH (ref 8–23)
Calcium, Ion: 1.04 mmol/L — ABNORMAL LOW (ref 1.15–1.40)
Chloride: 107 mmol/L (ref 98–111)
Creatinine, Ser: 2.2 mg/dL — ABNORMAL HIGH (ref 0.44–1.00)
Glucose, Bld: 86 mg/dL (ref 70–99)
HCT: 37 % (ref 36.0–46.0)
Hemoglobin: 12.6 g/dL (ref 12.0–15.0)
Potassium: 4.1 mmol/L (ref 3.5–5.1)
Sodium: 142 mmol/L (ref 135–145)
TCO2: 27 mmol/L (ref 22–32)

## 2019-08-15 SURGERY — CARDIOVERSION
Anesthesia: General

## 2019-08-15 MED ORDER — PROPOFOL 10 MG/ML IV BOLUS
INTRAVENOUS | Status: AC
  Filled 2019-08-15: qty 20

## 2019-08-15 MED ORDER — SODIUM CHLORIDE 0.9 % IV SOLN
INTRAVENOUS | Status: AC | PRN
  Administered 2019-08-15: 500 mL via INTRAMUSCULAR

## 2019-08-15 MED ORDER — EPHEDRINE 5 MG/ML INJ
INTRAVENOUS | Status: AC
  Filled 2019-08-15: qty 10

## 2019-08-15 MED ORDER — PROPOFOL 10 MG/ML IV BOLUS
INTRAVENOUS | Status: DC | PRN
  Administered 2019-08-15: 60 mg via INTRAVENOUS
  Administered 2019-08-15: 30 mg via INTRAVENOUS

## 2019-08-15 MED ORDER — LIDOCAINE 2% (20 MG/ML) 5 ML SYRINGE
INTRAMUSCULAR | Status: DC | PRN
  Administered 2019-08-15: 40 mg via INTRAVENOUS

## 2019-08-15 MED ORDER — PHENYLEPHRINE 40 MCG/ML (10ML) SYRINGE FOR IV PUSH (FOR BLOOD PRESSURE SUPPORT)
PREFILLED_SYRINGE | INTRAVENOUS | Status: AC
  Filled 2019-08-15: qty 10

## 2019-08-15 NOTE — CV Procedure (Signed)
Electrical Cardioversion Procedure Note Kathryn Richardson [id number] 04-Dec-1934  Procedure: Electrical Cardioversion Indications:  Atrial Fibrillation  Procedure Details Consent: Risks of procedure as well as the alternatives and risks of each were explained to the (patient/caregiver).  Consent for procedure obtained. Time Out: Verified patient identification, verified procedure, site/side was marked, verified correct patient position, special equipment/implants available, medications/allergies/relevent history reviewed, required imaging and test results available.  Performed  Patient placed on cardiac monitor, pulse oximetry, supplemental oxygen as necessary.  Sedation given: propofol Pacer pads placed anterior and posterior chest.  Cardioverted 1 time(s).  Cardioverted at 150J.  Evaluation Findings: Post procedure EKG shows: APVP Complications: None Patient did tolerate procedure well.   [name], MD 08/15/2019, 8:40 AM

## 2019-08-15 NOTE — Transfer of Care (Signed)
Immediate Anesthesia Transfer of Care Note  Patient: Kathryn Richardson  Procedure(s) Performed: CARDIOVERSION (N/A )  Patient Location: Endoscopy Unit  Anesthesia Type:General  Level of Consciousness: drowsy and responds to stimulation  Airway & Oxygen Therapy: Patient Spontanous Breathing  Post-op Assessment: Report given to RN  Post vital signs: Reviewed and stable  Last Vitals:  Vitals Value Taken Time  BP    Temp    Pulse 80 08/15/19 0844  Resp 16 08/15/19 0844  SpO2 92 % 08/15/19 0844  Vitals shown include unvalidated device data.  Last Pain:  Vitals:   08/15/19 0744  TempSrc: Oral  PainSc: 0-No pain         Complications: No apparent anesthesia complications

## 2019-08-15 NOTE — Interval H&P Note (Signed)
History and Physical Interval Note:  08/15/2019 8:31 AM  Kathryn Richardson  has presented today for surgery, with the diagnosis of AFIB.  The various methods of treatment have been discussed with the patient and family. After consideration of risks, benefits and other options for treatment, the patient has consented to  Procedure(s): CARDIOVERSION (N/A) as a surgical intervention.  The patient's history has been reviewed, patient examined, no change in status, stable for surgery.  I have reviewed the patient's chart and labs.  Questions were answered to the patient's satisfaction.     [name], MD

## 2019-08-15 NOTE — Discharge Instructions (Signed)
Electrical Cardioversion Electrical cardioversion is the delivery of a jolt of electricity to restore a normal rhythm to the heart. What can I expect after the procedure?  Your blood pressure, heart rate, breathing rate, and blood oxygen level will be monitored until you leave the hospital or clinic.  Your heart rhythm will be watched to make sure it does not change.  You may have some redness on the skin where the shocks were given. Follow these instructions at home:  Do not drive for 24 hours if you were given a sedative during your procedure.  Take over-the-counter and prescription medicines only as told by your health care provider.  Ask your health care provider how to check your pulse. Check it often.  Rest for 48 hours after the procedure or as told by your health care provider.  Avoid or limit your caffeine use as told by your health care provider.  Keep all follow-up visits as told by your health care provider. This is important. Contact a health care provider if:  You feel like your heart is beating too quickly or your pulse is not regular.  You have a serious muscle cramp that does not go away. Get help right away if:  You have discomfort in your chest.  You are dizzy or you feel faint.  You have trouble breathing or you are short of breath.  Your speech is slurred.  You have trouble moving an arm or leg on one side of your body.  Your fingers or toes turn cold or blue. Summary  Electrical cardioversion is the delivery of a jolt of electricity to restore a normal rhythm to the heart.  This procedure may be done right away in an emergency or may be a scheduled procedure if the condition is not an emergency.  Generally, this is a safe procedure.  After the procedure, check your pulse often as told by your health care provider. This information is not intended to replace advice given to you by your health care provider. Make sure you discuss any questions you  have with your health care provider. Document Revised: 11/29/2018 Document Reviewed: 11/29/2018 Elsevier Patient Education  [place].

## 2019-08-15 NOTE — Anesthesia Postprocedure Evaluation (Signed)
Anesthesia Post Note  Patient: Kathryn Richardson  Procedure(s) Performed: CARDIOVERSION (N/A )     Patient location during evaluation: PACU Anesthesia Type: General Level of consciousness: awake Pain management: pain level controlled Vital Signs Assessment: post-procedure vital signs reviewed and stable Respiratory status: spontaneous breathing Cardiovascular status: stable Postop Assessment: no apparent nausea or vomiting Anesthetic complications: no    Last Vitals:  Vitals:   08/15/19 0845 08/15/19 0850  BP: 110/61 (!) 139/58  Pulse: 80 64  Resp: 17 17  Temp: 36.6 C   SpO2: 92% 94%    Last Pain:  Vitals:   08/15/19 0850  TempSrc:   PainSc: 0-No pain                 Damarea Merkel

## 2019-08-15 NOTE — Anesthesia Postprocedure Evaluation (Signed)
Anesthesia Post Note  Patient: Kathryn Richardson  Procedure(s) Performed: CARDIOVERSION (N/A )     Anesthesia Post Evaluation  Last Vitals:  Vitals:   08/15/19 0845 08/15/19 0850  BP: 110/61 (!) 139/58  Pulse: 80 64  Resp: 17 17  Temp: 36.6 C   SpO2: 92% 94%    Last Pain:  Vitals:   08/15/19 0850  TempSrc:   PainSc: 0-No pain                 Kennet Mccort

## 2019-08-15 NOTE — Anesthesia Postprocedure Evaluation (Signed)
Anesthesia Post Note  Patient: Kathryn Richardson  Procedure(s) Performed: CARDIOVERSION (N/A )     Patient location during evaluation: PACU Anesthesia Type: General Level of consciousness: awake and alert Pain management: pain level controlled Vital Signs Assessment: post-procedure vital signs reviewed and stable Respiratory status: spontaneous breathing Cardiovascular status: blood pressure returned to baseline Postop Assessment: no apparent nausea or vomiting    Last Vitals:  Vitals:   08/15/19 0845 08/15/19 0850  BP: 110/61 (!) 139/58  Pulse: 80 64  Resp: 17 17  Temp: 36.6 C   SpO2: 92% 94%    Last Pain:  Vitals:   08/15/19 0850  TempSrc:   PainSc: 0-No pain                 Buel Molder

## 2019-08-16 NOTE — Progress Notes (Signed)
Attempted, unable to leave message/bt 

## 2019-08-22 ENCOUNTER — Telehealth: Payer: Self-pay | Admitting: Internal Medicine

## 2019-08-22 DIAGNOSIS — E1122 Type 2 diabetes mellitus with diabetic chronic kidney disease: Secondary | ICD-10-CM | POA: Diagnosis not present

## 2019-08-22 DIAGNOSIS — E7800 Pure hypercholesterolemia, unspecified: Secondary | ICD-10-CM | POA: Diagnosis not present

## 2019-08-22 DIAGNOSIS — I480 Paroxysmal atrial fibrillation: Secondary | ICD-10-CM

## 2019-08-22 DIAGNOSIS — N184 Chronic kidney disease, stage 4 (severe): Secondary | ICD-10-CM | POA: Diagnosis not present

## 2019-08-22 DIAGNOSIS — I4891 Unspecified atrial fibrillation: Secondary | ICD-10-CM | POA: Diagnosis not present

## 2019-08-22 DIAGNOSIS — E559 Vitamin D deficiency, unspecified: Secondary | ICD-10-CM | POA: Diagnosis not present

## 2019-08-22 DIAGNOSIS — E039 Hypothyroidism, unspecified: Secondary | ICD-10-CM | POA: Diagnosis not present

## 2019-08-22 DIAGNOSIS — M109 Gout, unspecified: Secondary | ICD-10-CM | POA: Diagnosis not present

## 2019-08-22 DIAGNOSIS — R609 Edema, unspecified: Secondary | ICD-10-CM | POA: Diagnosis not present

## 2019-08-22 NOTE — Telephone Encounter (Signed)
Returned call to Pt.  Advised would discuss with Dr. Lovena Le tomorrow.  In the meantime advised to take short acting diltiazem as needed for elevated HR.  Pt states she is.

## 2019-08-22 NOTE — Telephone Encounter (Signed)
    Patient c/o Palpitations:  High priority if patient c/o lightheadedness, shortness of breath, or chest pain  1) How long have you had palpitations/irregular HR/ Afib? Are you having the symptoms now?   2) Are you currently experiencing lightheadedness, SOB or CP? Lightheaded and a little SOB  3) Do you have a history of afib (atrial fibrillation) or irregular heart rhythm? yes  4) Have you checked your BP or HR? (document readings if available): 136/85 (approx) HR 120  5) Are you experiencing any other symptoms? No   Patient had a cardioversion last week but went back into  on Saturday. She went to her PCP today but was told to let Dr. Lovena Le know she is back in Afib

## 2019-08-23 NOTE — Telephone Encounter (Signed)
Per Dr. Lovena Le.  1.  Stop amio  2.  Order 3 day HM   Notified Pt of orders.  Pt indicates understanding.  She will call with any symptomatic elevated heart rates.

## 2019-08-23 NOTE — Telephone Encounter (Signed)
Follow Up  Pt called and stated that she was supposed to receive a call back this morning from Dr. Tanna Furry nurse but never received a call. Wants someone to call her before EOD so she know what to do.  Please call pt

## 2019-08-24 ENCOUNTER — Telehealth: Payer: Self-pay | Admitting: *Deleted

## 2019-08-24 NOTE — Telephone Encounter (Signed)
Calling to inform patient Kathryn Richardson will ship a 3 day ZIO XT long term holter monitor to her home.   No answer, no voicemail.

## 2019-08-24 NOTE — Telephone Encounter (Signed)
Pt has f/u scheduled for end of April.  Will await further needs.  HM ordered.

## 2019-08-28 ENCOUNTER — Ambulatory Visit (INDEPENDENT_AMBULATORY_CARE_PROVIDER_SITE_OTHER): Payer: Medicare Other

## 2019-08-28 DIAGNOSIS — I480 Paroxysmal atrial fibrillation: Secondary | ICD-10-CM | POA: Diagnosis not present

## 2019-09-06 ENCOUNTER — Encounter: Payer: Self-pay | Admitting: Internal Medicine

## 2019-09-06 ENCOUNTER — Telehealth: Payer: Self-pay

## 2019-09-06 ENCOUNTER — Ambulatory Visit (INDEPENDENT_AMBULATORY_CARE_PROVIDER_SITE_OTHER): Payer: Medicare Other | Admitting: Internal Medicine

## 2019-09-06 ENCOUNTER — Other Ambulatory Visit: Payer: Self-pay

## 2019-09-06 VITALS — BP 150/66 | HR 96 | Ht 63.0 in | Wt 197.4 lb

## 2019-09-06 DIAGNOSIS — Z950 Presence of cardiac pacemaker: Secondary | ICD-10-CM

## 2019-09-06 DIAGNOSIS — I480 Paroxysmal atrial fibrillation: Secondary | ICD-10-CM

## 2019-09-06 DIAGNOSIS — I495 Sick sinus syndrome: Secondary | ICD-10-CM | POA: Diagnosis not present

## 2019-09-06 LAB — CUP PACEART INCLINIC DEVICE CHECK
Brady Statistic AP VP Percent: 0.1 %
Brady Statistic AP VS Percent: 0.1 % — CL
Brady Statistic AS VP Percent: 24.2 %
Brady Statistic AS VS Percent: 75.6 %
Brady Statistic RV Percent Paced: 24.4 %
Date Time Interrogation Session: 20210427172804
Implantable Lead Implant Date: 20151217
Implantable Lead Implant Date: 20151217
Implantable Lead Location: 753859
Implantable Lead Location: 753860
Implantable Lead Model: 5076
Implantable Lead Model: 5076
Implantable Pulse Generator Implant Date: 20151217
Lead Channel Pacing Threshold Amplitude: 0.75 V
Lead Channel Pacing Threshold Pulse Width: 0.4 ms
Lead Channel Sensing Intrinsic Amplitude: 0.8 mV
Lead Channel Sensing Intrinsic Amplitude: 11.3 mV

## 2019-09-06 MED ORDER — METOPROLOL SUCCINATE ER 25 MG PO TB24: ORAL_TABLET | ORAL | 3 refills | Status: DC

## 2019-09-06 NOTE — Progress Notes (Signed)
HPI Mrs. Capers returns today for followup of uncontrolled atrial fib. She is a pleasant 85 yo woman with sinus node dysfunction s/p PPM insertion who was placed on amiodarone and underwent DCCV with ERAF. She feels poorly. She has worn a cardiac monitor but we do not have the results. She has not had syncope but does have class 3 CHF symptoms. She has peripheral edema. No chest pain. Interrogation of her PPM suggests atrial fib with a RVR.  Allergies  Allergen Reactions  . Cortisone Other (See Comments)    Hyperglycemia   . Prednisone Other (See Comments)    Hyperglycemia      Current Outpatient Medications  Medication Sig Dispense Refill  . allopurinol (ZYLOPRIM) 100 MG tablet Take 200 mg by mouth at bedtime.     Marland Kitchen atorvastatin (LIPITOR) 80 MG tablet Take 80 mg by mouth at bedtime.     . benazepril (LOTENSIN) 20 MG tablet Take 20 mg by mouth daily.    Marland Kitchen diltiazem (CARDIZEM CD) 360 MG 24 hr capsule Take 1 capsule (360 mg total) by mouth daily. 90 capsule 3  . diltiazem (CARDIZEM) 30 MG tablet Take 1 tablet every 4 hours AS NEEDED for afib heart rate 90 or greater. 90 tablet 3  . ELIQUIS 2.5 MG TABS tablet TAKE 1 TABLET BY MOUTH TWICE A DAY 60 tablet 6  . furosemide (LASIX) 40 MG tablet Take 1 tablet by mouth in the morning and at bedtime.    Marland Kitchen KLOR-CON M20 20 MEQ tablet TAKE 1 AND 1/2 TABLETS BY MOUTH EVERY DAY. 45 tablet 4  . LANTUS SOLOSTAR 100 UNIT/ML Solostar Pen Inject 62 Units into the skin daily.    Marland Kitchen levothyroxine (SYNTHROID, LEVOTHROID) 50 MCG tablet Take 50 mcg by mouth daily. On a empty stomach.    . rosuvastatin (CRESTOR) 20 MG tablet Take 1 tablet by mouth daily.     No current facility-administered medications for this visit.     Past Medical History:  Diagnosis Date  . Allergic rhinitis   . Atrial fibrillation ([place])   . Chronic renal disease, stage IV ([place])   . Essential hypertension   . GERD (gastroesophageal reflux disease)   . Gout   . Hyperlipemia,  mixed   . Insomnia   . Type 2 diabetes mellitus (HCC)     ROS:   All systems reviewed and negative except as noted in the HPI.   Past Surgical History:  Procedure Laterality Date  . CARDIOVERSION N/A 05/29/2014   Procedure: CARDIOVERSION;  Surgeon: Fay Records, MD;  Location: [facility] ENDOSCOPY;  Service: Cardiovascular;  Laterality: N/A;  . CARDIOVERSION N/A 11/03/2014   Procedure: CARDIOVERSION;  Surgeon: Thayer Headings, MD;  Location: [place];  Service: Cardiovascular;  Laterality: N/A;  . CARDIOVERSION N/A 04/14/2016   Procedure: CARDIOVERSION;  Surgeon: Jerline Pain, MD;  Location: [facility] ENDOSCOPY;  Service: Cardiovascular;  Laterality: N/A;  . CARDIOVERSION N/A 08/15/2019   Procedure: CARDIOVERSION;  Surgeon: [name], MD;  Location: [place];  Service: Cardiovascular;  Laterality: N/A;  . CESAREAN SECTION     x2  . ESOPHAGEAL DILATION    . PERMANENT PACEMAKER INSERTION N/A 04/26/2014   Procedure: PERMANENT PACEMAKER INSERTION;  Surgeon: Evans Lance, MD;  Location: [facility] CATH LAB;  Service: Cardiovascular;  Laterality: N/A;  . TEE WITHOUT CARDIOVERSION N/A 05/29/2014   Procedure: TRANSESOPHAGEAL ECHOCARDIOGRAM (TEE);  Surgeon: Fay Records, MD;  Location: [place];  Service: Cardiovascular;  Laterality: N/A;  Family History  Problem Relation Age of Onset  . Coronary artery disease Father 28  . Heart attack Father   . Congestive Heart Failure Mother   . Alzheimer's disease Mother      Social History   Socioeconomic History  . Marital status: Married    Spouse name: Not on file  . Number of children: Not on file  . Years of education: Not on file  . Highest education level: Not on file  Occupational History  . Not on file  Tobacco Use  . Smoking status: Never Smoker  . Smokeless tobacco: Never Used  Substance and Sexual Activity  . Alcohol use: No    Alcohol/week: 0.0 standard drinks  . Drug use: No  . Sexual activity: Yes    Birth  control/protection: Other-see comments  Other Topics Concern  . Not on file  Social History Narrative   Lives in [place].  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 Difficulty of Paying Living Expenses:   Food Insecurity:   . Worried About Charity fundraiser in the Last Year:   . Arboriculturist in the Last Year:   Transportation Needs:   . Film/video editor (Medical):   Marland Kitchen Lack of Transportation (Non-Medical):   Physical Activity:   . Days of Exercise per Week:   . Minutes of Exercise per Session:   Stress:   . Feeling of Stress :   Social Connections:   . Frequency of Communication with Friends and Family:   . Frequency of Social Gatherings with Friends and Family:   . Attends Religious Services:   . Active Member of Clubs or Organizations:   . Attends Archivist Meetings:   Marland Kitchen Marital Status:   Intimate Partner Violence:   . Fear of Current or Ex-Partner:   . Emotionally Abused:   Marland Kitchen Physically Abused:   . Sexually Abused:      BP (!) 150/66   Pulse 96   Ht 5\' 3"  (1.6 m)   Wt 197 lb 6.4 oz (89.5 kg)   SpO2 98%   BMI 34.97 kg/m   Physical Exam:  Well appearing NAD HEENT: Unremarkable Neck:  No JVD, no thyromegally Lymphatics:  No adenopathy Back:  No CVA tenderness Lungs:  Clear with  no wheezes HEART:  Regular rate rhythm, no murmurs, no rubs, no clicks Abd:  soft, positive bowel sounds, no organomegally, no rebound, no guarding Ext:  2 plus pulses, no edema, no cyanosis, no clubbing Skin:  No rashes no nodules Neuro:  CN II through XII intact, motor grossly intact  EKG - atrial fib with a  rvr and occaisional paced beats  DEVICE  Normal device function.  See PaceArt for details.   Assess/Plan: 1. Atrial fib with a  RVR - I discussed the treatment optiosn with the patient. We will add toprol in low dose and then uptitrate. We discussed AV node ablation as well  as atrial fib ablation. Her age, obesity, long time in atrial fib make the likelihood of maintaining NSR unlikely. In addition, she has failed amiodarone. If her rates cannot be slowed then considering AV node ablation might be the best choice. 2. PPM - her Med[employer]onic DDD PM is working normally. I might consider adding a left bundle area pacing lead or if her EF has gone down, an LV lead. 3. Chronic diastolic heart failure - her  EF was normal. Based on her heart monitor, I will likely think about a 2D echo. 4. HTN - her bp is elevated. Hopefully the addition of toprol will help with this as well.   Mikle Bosworth.D.

## 2019-09-06 NOTE — Telephone Encounter (Signed)
Patients I-pad is not working with remote monitoring. Patient called [employer] with no success helping patient. Called patient to ask if she can come into office for assistance, patient needs to bring ipad. Can consult with [employer] if needed.

## 2019-09-06 NOTE — Patient Instructions (Addendum)
Medication Instructions:  Your physician recommends that you continue on your current medications as directed. Please refer to the Current Medication list given to you today.  1.  Start taking:  Metoprolol succinate (Toprol XL)  25 mg    A.  First the first week take a 1/2 tablet by mouth daily.    B.  Then for the 2nd week- Take a 1/2 tablet by mouth twice a day    C.  Finally starting the 3rd week- Take a whole tablet by mouth twice a day-   Labwork: None ordered.  Testing/Procedures: None ordered.  Follow-Up:  Dr. Lovena Le will review your heart monitor next week and I will call you to advise.  Remote monitoring is used to monitor your Pacemaker from home. This monitoring reduces the number of office visits required to check your device to one time per year. It allows [place] to keep an eye on the functioning of your device to ensure it is working properly. You are scheduled for a device check from home on 12/05/2019. You may send your transmission at any time that day. If you have a wireless device, the transmission will be sent automatically. After your physician reviews your transmission, you will receive a postcard with your next transmission date.  Any Other Special Instructions Will Be Listed Below (If Applicable).  If you need a refill on your cardiac medications before your next appointment, please call your pharmacy.   Metoprolol Extended-Release Capsules What is this medicine? METOPROLOL (me TOE proe lole) is a beta blocker. It decreases the amount of work your heart has to do and helps your heart beat regularly. It treats high blood pressure and/or prevents chest pain (also called angina). It also treats heart failure. This medicine may be used for other purposes; ask your health care provider or pharmacist if you have questions. COMMON BRAND NAME(S): KAPSPARGO What should I tell my health care provider before I take this medicine? They need to know if you have any of these  conditions:  diabetes  heart disease  liver disease  lung or breathing disease, like asthma  pheochromocytoma  thyroid disease  an unusual or allergic reaction to metoprolol, other beta-blockers, medicines, foods, dyes, or preservatives  pregnant or trying to get pregnant  breast-feeding How should I use this medicine? Take this drug by mouth with water. Take it as directed on the prescription label at the same time every day. Do not cut, crush or chew this drug. Swallow the capsules whole. You may open the capsule and put the contents in 1 teaspoon of applesauce. Swallow the drug and applesauce right away. Do not chew the drug or applesauce. Keep taking it unless your health care provider tells you to stop. Talk to your health care provider about the use of this drug in children. While it may be prescribed for children as young as 6 for selected conditions, precautions do apply. Overdosage: If you think you have taken too much of this medicine contact a poison control center or emergency room at once. NOTE: This medicine is only for you. Do not share this medicine with others. What if I miss a dose? If you miss a dose, take it as soon as you can. If it is almost time for your next dose, take only that dose. Do not take double or extra doses. What may interact with this medicine? This medicine may interact with the following medications:  certain medicines for blood pressure, heart disease, irregular heart beat  epinephrine  fluoxetine  MAOIs like Carbex, Eldepryl, Marplan, Nardil, and Parnate  paroxetine  reserpine This list may not describe all possible interactions. Give your health care provider a list of all the medicines, herbs, non-prescription drugs, or dietary supplements you use. Also tell them if you smoke, drink alcohol, or use illegal drugs. Some items may interact with your medicine. What should I watch for while using this medicine? You may get drowsy or  dizzy. Do not drive, use machinery, or do anything that needs mental alertness until you know how this medicine affects you. Do not stand or sit up quickly, especially if you are an older patient. This reduces the risk of dizzy or fainting spells. Alcohol may interfere with the effect of this medicine. Avoid alcoholic drinks. Visit your doctor or health care professional for regular checks on your progress. Check your blood pressure as directed. Ask your doctor or health care professional what your blood pressure should be and when you should contact him or her. Do not treat yourself for coughs, colds, or pain while you are using this medicine without asking your doctor or health care professional for advice. Some ingredients may increase your blood pressure. This medicine may increase blood sugar. Ask your healthcare provider if changes in diet or medicines are needed if you have diabetes. What side effects may I notice from receiving this medicine? Side effects that you should report to your doctor or health care professional as soon as possible:  allergic reactions like skin rash, itching or hives, swelling of the face, lips, or tongue  cold hands or feet  signs and symptoms of high blood sugar such as being more thirsty or hungry or having to urinate more than normal. You may also feel very tired or have blurry vision.  signs and symptoms of low blood pressure like dizziness; feeling faint or lightheaded, falls; unusually weak or tired  signs of worsening heart failure like breathing problems, swelling in your legs and feet  suicidal thoughts or other mood changes  unusually slow heartbeat Side effects that usually do not require medical attention (report these to your doctor or health care professional if they continue or are bothersome):  anxious  change in sex drive or performance  diarrhea  headache  trouble sleeping  upset stomach This list may not describe all possible side  effects. Call your doctor for medical advice about side effects. You may report side effects to FDA at 1-800-FDA-1088. Where should I keep my medicine? Keep out of the reach of children and pets. Store at room temperature between 20 and 25 degrees C (68 and 77 degrees F). Throw away any unused drug after the expiration date. NOTE: This sheet is a summary. It may not cover all possible information. If you have questions about this medicine, talk to your doctor, pharmacist, or health care provider.  2020 Elsevier/Gold Standard (2019-01-26 13:24:03)

## 2019-09-06 NOTE — Telephone Encounter (Signed)
Spoke with patient to remind of missed remote transmission 

## 2019-09-07 NOTE — Telephone Encounter (Signed)
Pt states she can not get her app to work to send a transmission. I told her we will cancel her upcoming appointment until she can get it to work.

## 2019-09-12 DIAGNOSIS — I480 Paroxysmal atrial fibrillation: Secondary | ICD-10-CM | POA: Diagnosis not present

## 2019-09-15 ENCOUNTER — Telehealth: Payer: Self-pay

## 2019-09-15 DIAGNOSIS — I480 Paroxysmal atrial fibrillation: Secondary | ICD-10-CM

## 2019-09-15 DIAGNOSIS — I495 Sick sinus syndrome: Secondary | ICD-10-CM

## 2019-09-15 DIAGNOSIS — Z79899 Other long term (current) drug therapy: Secondary | ICD-10-CM

## 2019-09-15 DIAGNOSIS — I4891 Unspecified atrial fibrillation: Secondary | ICD-10-CM

## 2019-09-15 NOTE — Telephone Encounter (Signed)
Pt advised her monitor results per Dr. Lovena Le. She reports that she is still having swelling in her lower extremities. She has Sob with exertion only and can still lay flat at night to sleep.  Pt says she has been watching her sodium intake and has ben elevating her feet quite often.  I advised her that I will forward to Dr. Lovena Le for his review and if any changes can be made to give her relief with her meds.

## 2019-09-16 MED ORDER — FUROSEMIDE 40 MG PO TABS: 60.0000 mg | ORAL_TABLET | Freq: Every day | ORAL | 3 refills | Status: AC

## 2019-09-16 NOTE — Telephone Encounter (Signed)
Per Dr. Lovena Le, pt advised to increase her Lasix to 60 mg po bid and will return 09/30/19 for repeat BMET.

## 2019-09-20 DIAGNOSIS — K219 Gastro-esophageal reflux disease without esophagitis: Secondary | ICD-10-CM | POA: Diagnosis not present

## 2019-09-20 DIAGNOSIS — Z794 Long term (current) use of insulin: Secondary | ICD-10-CM | POA: Diagnosis not present

## 2019-09-20 DIAGNOSIS — M810 Age-related osteoporosis without current pathological fracture: Secondary | ICD-10-CM | POA: Diagnosis not present

## 2019-09-20 DIAGNOSIS — E039 Hypothyroidism, unspecified: Secondary | ICD-10-CM | POA: Diagnosis not present

## 2019-09-20 DIAGNOSIS — N184 Chronic kidney disease, stage 4 (severe): Secondary | ICD-10-CM | POA: Diagnosis not present

## 2019-09-20 DIAGNOSIS — E1122 Type 2 diabetes mellitus with diabetic chronic kidney disease: Secondary | ICD-10-CM | POA: Diagnosis not present

## 2019-09-30 ENCOUNTER — Other Ambulatory Visit: Payer: Self-pay

## 2019-09-30 ENCOUNTER — Other Ambulatory Visit: Payer: Medicare Other | Admitting: *Deleted

## 2019-09-30 ENCOUNTER — Telehealth: Payer: Self-pay | Admitting: Internal Medicine

## 2019-09-30 DIAGNOSIS — I4891 Unspecified atrial fibrillation: Secondary | ICD-10-CM

## 2019-09-30 DIAGNOSIS — I480 Paroxysmal atrial fibrillation: Secondary | ICD-10-CM | POA: Diagnosis not present

## 2019-09-30 DIAGNOSIS — I495 Sick sinus syndrome: Secondary | ICD-10-CM | POA: Diagnosis not present

## 2019-09-30 DIAGNOSIS — Z79899 Other long term (current) drug therapy: Secondary | ICD-10-CM | POA: Diagnosis not present

## 2019-09-30 NOTE — Telephone Encounter (Signed)
Pt c/o medication issue:  1. Name of Medication: metoprolol succinate (TOPROL XL) 25 MG 24 hr tablet  2. How are you currently taking this medication (dosage and times per day)? Has not taken since Thursday night 09/29/19  3. Are you having a reaction (difficulty breathing--STAT)? Yes   4. What is your medication issue? Kathryn Richardson is calling stating she began taking a full tablet of metoprolol Tuesday night 09/27/19. Yesterday she began filling dizziness that lasted through the night and she vomited this morning. Kathryn Richardson has not taking this medicine today. Please advise.

## 2019-09-30 NOTE — Telephone Encounter (Signed)
Pt states her HR was elevated and she has been in and out of Afib consistently even after her cardioversion. Dr. Lovena Le saw patient in office on 4/27 and prescribed the Metoprolol. Since taking the Metoprolol patient has not been in AFib.   The patient is calling in due to experiencing dizziness and vomiting yesterday. She has been slowly increasing her Metoprolol per Dr. Lovena Le. The first time the  patient took the full 25 mg twice daily was Tuesday 5/18. Patient began feeling sick and dizzy with a terrible headache on Thursday 5/20 afternoon. At this time her BP 140/75 with HR of 67. The patient has continued taking the Metoprolol and felt dizzy again last evening after taking the medication. She has not taken her Metoprolol this AM. Pt states she feels best when taking 12.5 mg Metoprolol twice daily. Her current BP128/74 HR 84.  Spoke with A Tillery, PA for advisement who states it is ok for the patient to continue taking the 12.5 mg twice daily of metoprolol at this time. Pt made aware. Advised patient to continue monitoring her BP and HR daily and call the office back end of next week with those readings so that MD can advise further if necessary. She is aware to call the office back with any new concerns. Will route to Dr. Lovena Le and his RN JS as an FYI/future reference.

## 2019-10-01 LAB — BASIC METABOLIC PANEL
BUN/Creatinine Ratio: 19 (ref 12–28)
BUN: 41 mg/dL — ABNORMAL HIGH (ref 8–27)
CO2: 24 mmol/L (ref 20–29)
Calcium: 9.6 mg/dL (ref 8.7–10.3)
Chloride: 102 mmol/L (ref 96–106)
Creatinine, Ser: 2.12 mg/dL — ABNORMAL HIGH (ref 0.57–1.00)
GFR calc Af Amer: 24 mL/min/{1.73_m2} — ABNORMAL LOW (ref >59)
GFR calc non Af Amer: 21 mL/min/{1.73_m2} — ABNORMAL LOW (ref >59)
Glucose: 232 mg/dL — ABNORMAL HIGH (ref 65–99)
Potassium: 4.3 mmol/L (ref 3.5–5.2)
Sodium: 139 mmol/L (ref 134–144)

## 2019-10-03 ENCOUNTER — Ambulatory Visit (INDEPENDENT_AMBULATORY_CARE_PROVIDER_SITE_OTHER): Payer: Medicare Other | Admitting: *Deleted

## 2019-10-03 DIAGNOSIS — I495 Sick sinus syndrome: Secondary | ICD-10-CM | POA: Diagnosis not present

## 2019-10-03 LAB — CUP PACEART REMOTE DEVICE CHECK
Battery Remaining Longevity: 39 mo
Battery Voltage: 2.98 V
Brady Statistic AP VP Percent: 0.92 %
Brady Statistic AP VS Percent: 0.17 %
Brady Statistic AS VP Percent: 63.5 %
Brady Statistic AS VS Percent: 35.4 %
Brady Statistic RA Percent Paced: 0.92 %
Brady Statistic RV Percent Paced: 64.42 %
Date Time Interrogation Session: 20210524093442
Implantable Lead Implant Date: 20151217
Implantable Lead Implant Date: 20151217
Implantable Lead Location: 753859
Implantable Lead Location: 753860
Implantable Lead Model: 5076
Implantable Lead Model: 5076
Implantable Pulse Generator Implant Date: 20151217
Lead Channel Impedance Value: 342 Ohm
Lead Channel Impedance Value: 437 Ohm
Lead Channel Impedance Value: 456 Ohm
Lead Channel Impedance Value: 456 Ohm
Lead Channel Pacing Threshold Amplitude: 0.75 V
Lead Channel Pacing Threshold Amplitude: 1 V
Lead Channel Pacing Threshold Pulse Width: 0.4 ms
Lead Channel Pacing Threshold Pulse Width: 0.4 ms
Lead Channel Sensing Intrinsic Amplitude: 0.125 mV
Lead Channel Sensing Intrinsic Amplitude: 0.125 mV
Lead Channel Sensing Intrinsic Amplitude: 8.5 mV
Lead Channel Sensing Intrinsic Amplitude: 8.5 mV
Lead Channel Setting Pacing Amplitude: 2 V
Lead Channel Setting Pacing Amplitude: 2.5 V
Lead Channel Setting Pacing Pulse Width: 0.4 ms
Lead Channel Setting Sensing Sensitivity: 0.9 mV

## 2019-10-03 MED ORDER — METOPROLOL SUCCINATE ER 25 MG PO TB24: 12.5000 mg | ORAL_TABLET | Freq: Two times a day (BID) | ORAL | 3 refills | Status: AC

## 2019-10-04 DIAGNOSIS — E1165 Type 2 diabetes mellitus with hyperglycemia: Secondary | ICD-10-CM | POA: Diagnosis not present

## 2019-10-04 DIAGNOSIS — I129 Hypertensive chronic kidney disease with stage 1 through stage 4 chronic kidney disease, or unspecified chronic kidney disease: Secondary | ICD-10-CM | POA: Diagnosis not present

## 2019-10-04 DIAGNOSIS — E039 Hypothyroidism, unspecified: Secondary | ICD-10-CM | POA: Diagnosis not present

## 2019-10-04 DIAGNOSIS — E785 Hyperlipidemia, unspecified: Secondary | ICD-10-CM | POA: Diagnosis not present

## 2019-10-04 DIAGNOSIS — M810 Age-related osteoporosis without current pathological fracture: Secondary | ICD-10-CM | POA: Diagnosis not present

## 2019-10-04 DIAGNOSIS — I4891 Unspecified atrial fibrillation: Secondary | ICD-10-CM | POA: Diagnosis not present

## 2019-10-04 DIAGNOSIS — E1122 Type 2 diabetes mellitus with diabetic chronic kidney disease: Secondary | ICD-10-CM | POA: Diagnosis not present

## 2019-10-04 DIAGNOSIS — E7800 Pure hypercholesterolemia, unspecified: Secondary | ICD-10-CM | POA: Diagnosis not present

## 2019-10-04 DIAGNOSIS — N184 Chronic kidney disease, stage 4 (severe): Secondary | ICD-10-CM | POA: Diagnosis not present

## 2019-10-04 NOTE — Progress Notes (Signed)
Remote pacemaker transmission.   

## 2019-10-11 ENCOUNTER — Telehealth: Payer: Self-pay | Admitting: Internal Medicine

## 2019-10-11 NOTE — Telephone Encounter (Signed)
New Message    Pt is calling to leave her BP readings   05/23 125/61 hr 70  126/60 hr 66 05/24 124/68 hr 77  130/70 hr 80  05/25 135/78 hr 77   134/72 hr 79 05/26 127/68 hr 83  112/59 hr 62  05/27 112/62 hr 64   118/60 hr 71 05/28 122/600 hr 65   122/61 hr 67  05/29 123/68 hr 64   127/68 hr 66   Please advise

## 2019-10-13 NOTE — Telephone Encounter (Signed)
Returned call to Pt.  Advised per Dr. Lovena Le Pt's blood pressure and heart rates looked good on metoprolol succinate 12.5 mg BID  Per Dr. Sherron Ales Pt f/u in 4 months.  Per Pt she would like a recall letter when it gets closer.  Recall entered.

## 2019-11-03 ENCOUNTER — Other Ambulatory Visit: Payer: Self-pay | Admitting: Internal Medicine

## 2019-11-03 DIAGNOSIS — I4891 Unspecified atrial fibrillation: Secondary | ICD-10-CM

## 2019-11-03 NOTE — Telephone Encounter (Signed)
Prescription refill request for Eliquis received. Indication:  A Fib Last office visit: 09/06/19 Dr Lovena Le Scr: 2.12 09/30/19 Age: 85  Weight: 89.5 kg

## 2019-11-16 DIAGNOSIS — N184 Chronic kidney disease, stage 4 (severe): Secondary | ICD-10-CM | POA: Diagnosis not present

## 2019-11-16 DIAGNOSIS — E7800 Pure hypercholesterolemia, unspecified: Secondary | ICD-10-CM | POA: Diagnosis not present

## 2019-11-16 DIAGNOSIS — I4891 Unspecified atrial fibrillation: Secondary | ICD-10-CM | POA: Diagnosis not present

## 2019-11-16 DIAGNOSIS — M810 Age-related osteoporosis without current pathological fracture: Secondary | ICD-10-CM | POA: Diagnosis not present

## 2019-11-16 DIAGNOSIS — E785 Hyperlipidemia, unspecified: Secondary | ICD-10-CM | POA: Diagnosis not present

## 2019-11-16 DIAGNOSIS — I129 Hypertensive chronic kidney disease with stage 1 through stage 4 chronic kidney disease, or unspecified chronic kidney disease: Secondary | ICD-10-CM | POA: Diagnosis not present

## 2019-11-16 DIAGNOSIS — E039 Hypothyroidism, unspecified: Secondary | ICD-10-CM | POA: Diagnosis not present

## 2019-11-16 DIAGNOSIS — E1122 Type 2 diabetes mellitus with diabetic chronic kidney disease: Secondary | ICD-10-CM | POA: Diagnosis not present

## 2019-11-16 DIAGNOSIS — E1165 Type 2 diabetes mellitus with hyperglycemia: Secondary | ICD-10-CM | POA: Diagnosis not present

## 2019-11-20 ENCOUNTER — Other Ambulatory Visit: Payer: Self-pay | Admitting: Internal Medicine

## 2019-11-24 ENCOUNTER — Other Ambulatory Visit: Payer: Self-pay | Admitting: Internal Medicine

## 2019-11-24 MED ORDER — DILTIAZEM HCL 30 MG PO TABS: ORAL_TABLET | ORAL | 2 refills | Status: AC

## 2019-12-14 DIAGNOSIS — R05 Cough: Secondary | ICD-10-CM | POA: Diagnosis not present

## 2019-12-15 DIAGNOSIS — R05 Cough: Secondary | ICD-10-CM | POA: Diagnosis not present

## 2019-12-17 ENCOUNTER — Other Ambulatory Visit (HOSPITAL_COMMUNITY): Payer: Self-pay | Admitting: Oncology

## 2019-12-19 ENCOUNTER — Telehealth (HOSPITAL_COMMUNITY): Payer: Self-pay | Admitting: Oncology

## 2019-12-19 NOTE — Telephone Encounter (Signed)
Re: Mab infusion  Spoke to patients to see if she had received her COVID-19 test results back.  She is still waiting for her results.  Testing was last Thursday.  She is [facility].  Patient will call this morning to get her results and call us back to confirm or cancel her appointment for tomorrow.  Faythe Casa, NP 12/19/2019 10:18 AM

## 2019-12-20 ENCOUNTER — Ambulatory Visit (HOSPITAL_COMMUNITY)
Admission: RE | Admit: 2019-12-20 | Discharge: 2019-12-20 | Disposition: A | Discharge status: Home / Self Care | Payer: Medicare Other | Source: Ambulatory Visit | Attending: Pulmonary Disease | Admitting: Pulmonary Disease

## 2019-12-20 ENCOUNTER — Other Ambulatory Visit (HOSPITAL_COMMUNITY): Payer: Self-pay | Admitting: Oncology

## 2019-12-20 DIAGNOSIS — U071 COVID-19: Secondary | ICD-10-CM | POA: Insufficient documentation

## 2019-12-20 DIAGNOSIS — Z23 Encounter for immunization: Secondary | ICD-10-CM | POA: Insufficient documentation

## 2019-12-20 MED ORDER — EPINEPHRINE 0.3 MG/0.3ML IJ SOAJ: 0.3000 mg | Freq: Once | INTRAMUSCULAR | Status: DC | PRN

## 2019-12-20 MED ORDER — SODIUM CHLORIDE 0.9 % IV SOLN
1200.0000 mg | Freq: Once | INTRAVENOUS | Status: AC
  Administered 2019-12-20: 1200 mg via INTRAVENOUS
  Filled 2019-12-20: qty 10

## 2019-12-20 MED ORDER — DIPHENHYDRAMINE HCL 50 MG/ML IJ SOLN: 50.0000 mg | Freq: Once | INTRAMUSCULAR | Status: DC | PRN

## 2019-12-20 MED ORDER — ALBUTEROL SULFATE HFA 108 (90 BASE) MCG/ACT IN AERS: 2.0000 | INHALATION_SPRAY | Freq: Once | RESPIRATORY_TRACT | Status: DC | PRN

## 2019-12-20 MED ORDER — FAMOTIDINE IN NACL 20-0.9 MG/50ML-% IV SOLN: 20.0000 mg | Freq: Once | INTRAVENOUS | Status: DC | PRN

## 2019-12-20 MED ORDER — METHYLPREDNISOLONE SODIUM SUCC 125 MG IJ SOLR: 125.0000 mg | Freq: Once | INTRAMUSCULAR | Status: DC | PRN

## 2019-12-20 MED ORDER — SODIUM CHLORIDE 0.9 % IV SOLN: INTRAVENOUS | Status: DC | PRN

## 2019-12-20 NOTE — Progress Notes (Signed)
  Diagnosis: COVID-19  Physician: Dr. Asencion Noble  Procedure: Covid Infusion Clinic Med: casirivimab\imdevimab infusion - Provided patient with casirivimab\imdevimab fact sheet for patients, parents and caregivers prior to infusion.  Complications: No immediate complications noted.  Discharge: Discharged home   Kathryn Richardson 12/20/2019

## 2019-12-20 NOTE — Discharge Instructions (Signed)

## 2019-12-22 ENCOUNTER — Emergency Department (HOSPITAL_BASED_OUTPATIENT_CLINIC_OR_DEPARTMENT_OTHER): Payer: Medicare Other

## 2019-12-22 ENCOUNTER — Encounter (HOSPITAL_BASED_OUTPATIENT_CLINIC_OR_DEPARTMENT_OTHER): Payer: Self-pay | Admitting: *Deleted

## 2019-12-22 ENCOUNTER — Other Ambulatory Visit: Payer: Self-pay

## 2019-12-22 ENCOUNTER — Inpatient Hospital Stay (HOSPITAL_BASED_OUTPATIENT_CLINIC_OR_DEPARTMENT_OTHER)
Admission: EM | Admit: 2019-12-22 | Discharge: 2020-01-11 | DRG: 177 | Disposition: E | Payer: Medicare Other | Attending: Internal Medicine | Admitting: Internal Medicine

## 2019-12-22 DIAGNOSIS — I4891 Unspecified atrial fibrillation: Secondary | ICD-10-CM | POA: Diagnosis present

## 2019-12-22 DIAGNOSIS — E1165 Type 2 diabetes mellitus with hyperglycemia: Secondary | ICD-10-CM | POA: Diagnosis not present

## 2019-12-22 DIAGNOSIS — I517 Cardiomegaly: Secondary | ICD-10-CM | POA: Diagnosis not present

## 2019-12-22 DIAGNOSIS — E873 Alkalosis: Secondary | ICD-10-CM | POA: Diagnosis present

## 2019-12-22 DIAGNOSIS — Z950 Presence of cardiac pacemaker: Secondary | ICD-10-CM

## 2019-12-22 DIAGNOSIS — F419 Anxiety disorder, unspecified: Secondary | ICD-10-CM | POA: Diagnosis not present

## 2019-12-22 DIAGNOSIS — Z7901 Long term (current) use of anticoagulants: Secondary | ICD-10-CM

## 2019-12-22 DIAGNOSIS — I495 Sick sinus syndrome: Secondary | ICD-10-CM | POA: Diagnosis present

## 2019-12-22 DIAGNOSIS — T380X5A Adverse effect of glucocorticoids and synthetic analogues, initial encounter: Secondary | ICD-10-CM | POA: Diagnosis present

## 2019-12-22 DIAGNOSIS — R0902 Hypoxemia: Secondary | ICD-10-CM

## 2019-12-22 DIAGNOSIS — R0602 Shortness of breath: Secondary | ICD-10-CM

## 2019-12-22 DIAGNOSIS — Z888 Allergy status to other drugs, medicaments and biological substances status: Secondary | ICD-10-CM

## 2019-12-22 DIAGNOSIS — J1282 Pneumonia due to coronavirus disease 2019: Secondary | ICD-10-CM | POA: Diagnosis present

## 2019-12-22 DIAGNOSIS — R112 Nausea with vomiting, unspecified: Secondary | ICD-10-CM

## 2019-12-22 DIAGNOSIS — E871 Hypo-osmolality and hyponatremia: Secondary | ICD-10-CM | POA: Diagnosis present

## 2019-12-22 DIAGNOSIS — D62 Acute posthemorrhagic anemia: Secondary | ICD-10-CM | POA: Diagnosis not present

## 2019-12-22 DIAGNOSIS — M109 Gout, unspecified: Secondary | ICD-10-CM | POA: Diagnosis present

## 2019-12-22 DIAGNOSIS — N179 Acute kidney failure, unspecified: Secondary | ICD-10-CM | POA: Diagnosis not present

## 2019-12-22 DIAGNOSIS — N184 Chronic kidney disease, stage 4 (severe): Secondary | ICD-10-CM | POA: Diagnosis present

## 2019-12-22 DIAGNOSIS — Z6841 Body Mass Index (BMI) 40.0 and over, adult: Secondary | ICD-10-CM

## 2019-12-22 DIAGNOSIS — R509 Fever, unspecified: Secondary | ICD-10-CM | POA: Diagnosis not present

## 2019-12-22 DIAGNOSIS — K5900 Constipation, unspecified: Secondary | ICD-10-CM | POA: Diagnosis not present

## 2019-12-22 DIAGNOSIS — J9601 Acute respiratory failure with hypoxia: Secondary | ICD-10-CM | POA: Diagnosis present

## 2019-12-22 DIAGNOSIS — Z8249 Family history of ischemic heart disease and other diseases of the circulatory system: Secondary | ICD-10-CM

## 2019-12-22 DIAGNOSIS — Z79890 Hormone replacement therapy (postmenopausal): Secondary | ICD-10-CM

## 2019-12-22 DIAGNOSIS — Z820 Family history of epilepsy and other diseases of the nervous system: Secondary | ICD-10-CM

## 2019-12-22 DIAGNOSIS — E782 Mixed hyperlipidemia: Secondary | ICD-10-CM | POA: Diagnosis present

## 2019-12-22 DIAGNOSIS — U071 COVID-19: Secondary | ICD-10-CM | POA: Diagnosis not present

## 2019-12-22 DIAGNOSIS — Z66 Do not resuscitate: Secondary | ICD-10-CM | POA: Diagnosis not present

## 2019-12-22 DIAGNOSIS — R7989 Other specified abnormal findings of blood chemistry: Secondary | ICD-10-CM | POA: Diagnosis not present

## 2019-12-22 DIAGNOSIS — J189 Pneumonia, unspecified organism: Secondary | ICD-10-CM | POA: Diagnosis not present

## 2019-12-22 DIAGNOSIS — I480 Paroxysmal atrial fibrillation: Secondary | ICD-10-CM | POA: Diagnosis present

## 2019-12-22 DIAGNOSIS — Z515 Encounter for palliative care: Secondary | ICD-10-CM | POA: Diagnosis not present

## 2019-12-22 DIAGNOSIS — G4700 Insomnia, unspecified: Secondary | ICD-10-CM | POA: Diagnosis present

## 2019-12-22 DIAGNOSIS — K219 Gastro-esophageal reflux disease without esophagitis: Secondary | ICD-10-CM | POA: Diagnosis present

## 2019-12-22 DIAGNOSIS — R040 Epistaxis: Secondary | ICD-10-CM | POA: Diagnosis not present

## 2019-12-22 DIAGNOSIS — Z79899 Other long term (current) drug therapy: Secondary | ICD-10-CM

## 2019-12-22 DIAGNOSIS — I129 Hypertensive chronic kidney disease with stage 1 through stage 4 chronic kidney disease, or unspecified chronic kidney disease: Secondary | ICD-10-CM | POA: Diagnosis present

## 2019-12-22 DIAGNOSIS — E872 Acidosis: Secondary | ICD-10-CM | POA: Diagnosis present

## 2019-12-22 DIAGNOSIS — E1122 Type 2 diabetes mellitus with diabetic chronic kidney disease: Secondary | ICD-10-CM | POA: Diagnosis present

## 2019-12-22 DIAGNOSIS — E039 Hypothyroidism, unspecified: Secondary | ICD-10-CM | POA: Diagnosis present

## 2019-12-22 LAB — I-STAT VENOUS BLOOD GAS, ED
Acid-Base Excess: 3 mmol/L — ABNORMAL HIGH (ref 0.0–2.0)
Bicarbonate: 26.1 mmol/L (ref 20.0–28.0)
Calcium, Ion: 1.07 mmol/L — ABNORMAL LOW (ref 1.15–1.40)
HCT: 43 % (ref 36.0–46.0)
Hemoglobin: 14.6 g/dL (ref 12.0–15.0)
O2 Saturation: 85 %
Patient temperature: 98.5
Potassium: 3.4 mmol/L — ABNORMAL LOW (ref 3.5–5.1)
Sodium: 134 mmol/L — ABNORMAL LOW (ref 135–145)
TCO2: 27 mmol/L (ref 22–32)
pCO2, Ven: 33.8 mmHg — ABNORMAL LOW (ref 44.0–60.0)
pH, Ven: 7.495 — ABNORMAL HIGH (ref 7.250–7.430)
pO2, Ven: 45 mmHg (ref 32.0–45.0)

## 2019-12-22 LAB — COMPREHENSIVE METABOLIC PANEL
ALT: 20 U/L (ref 0–44)
AST: 26 U/L (ref 15–41)
Albumin: 2.8 g/dL — ABNORMAL LOW (ref 3.5–5.0)
Alkaline Phosphatase: 47 U/L (ref 38–126)
Anion gap: 12 (ref 5–15)
BUN: 32 mg/dL — ABNORMAL HIGH (ref 8–23)
CO2: 23 mmol/L (ref 22–32)
Calcium: 7.9 mg/dL — ABNORMAL LOW (ref 8.9–10.3)
Chloride: 97 mmol/L — ABNORMAL LOW (ref 98–111)
Creatinine, Ser: 1.76 mg/dL — ABNORMAL HIGH (ref 0.44–1.00)
GFR calc Af Amer: 30 mL/min — ABNORMAL LOW (ref >60)
GFR calc non Af Amer: 26 mL/min — ABNORMAL LOW (ref >60)
Glucose, Bld: 331 mg/dL — ABNORMAL HIGH (ref 70–99)
Potassium: 3.6 mmol/L (ref 3.5–5.1)
Sodium: 132 mmol/L — ABNORMAL LOW (ref 135–145)
Total Bilirubin: 1 mg/dL (ref 0.3–1.2)
Total Protein: 6.4 g/dL — ABNORMAL LOW (ref 6.5–8.1)

## 2019-12-22 LAB — CBC WITH DIFFERENTIAL/PLATELET
Abs Immature Granulocytes: 0.08 10*3/uL — ABNORMAL HIGH (ref 0.00–0.07)
Basophils Absolute: 0 10*3/uL (ref 0.0–0.1)
Basophils Relative: 0 %
Eosinophils Absolute: 0 10*3/uL (ref 0.0–0.5)
Eosinophils Relative: 0 %
HCT: 43.6 % (ref 36.0–46.0)
Hemoglobin: 14.8 g/dL (ref 12.0–15.0)
Immature Granulocytes: 1 %
Lymphocytes Relative: 7 %
Lymphs Abs: 0.6 10*3/uL — ABNORMAL LOW (ref 0.7–4.0)
MCH: 31.3 pg (ref 26.0–34.0)
MCHC: 33.9 g/dL (ref 30.0–36.0)
MCV: 92.2 fL (ref 80.0–100.0)
Monocytes Absolute: 0.5 10*3/uL (ref 0.1–1.0)
Monocytes Relative: 5 %
Neutro Abs: 8.6 10*3/uL — ABNORMAL HIGH (ref 1.7–7.7)
Neutrophils Relative %: 87 %
Platelets: 181 10*3/uL (ref 150–400)
RBC: 4.73 MIL/uL (ref 3.87–5.11)
RDW: 15.1 % (ref 11.5–15.5)
WBC: 9.8 10*3/uL (ref 4.0–10.5)
nRBC: 0 % (ref 0.0–0.2)

## 2019-12-22 LAB — LACTIC ACID, PLASMA: Lactic Acid, Venous: 2.1 mmol/L (ref 0.5–1.9)

## 2019-12-22 LAB — D-DIMER, QUANTITATIVE: D-Dimer, Quant: 0.72 ug/mL-FEU — ABNORMAL HIGH (ref 0.00–0.50)

## 2019-12-22 LAB — SARS CORONAVIRUS 2 BY RT PCR (HOSPITAL ORDER, PERFORMED IN ~~LOC~~ HOSPITAL LAB): SARS Coronavirus 2: POSITIVE — AB

## 2019-12-22 MED ORDER — SODIUM CHLORIDE 0.9 % IV SOLN
100.0000 mg | Freq: Every day | INTRAVENOUS | Status: AC
  Administered 2019-12-23 – 2019-12-26 (×4): 100 mg via INTRAVENOUS
  Filled 2019-12-22 (×4): qty 20

## 2019-12-22 MED ORDER — SODIUM CHLORIDE 0.9 % IV SOLN: INTRAVENOUS | Status: DC | PRN

## 2019-12-22 MED ORDER — ALBUTEROL SULFATE HFA 108 (90 BASE) MCG/ACT IN AERS
2.0000 | INHALATION_SPRAY | Freq: Once | RESPIRATORY_TRACT | Status: AC
  Administered 2019-12-22: 2 via RESPIRATORY_TRACT
  Filled 2019-12-22: qty 6.7

## 2019-12-22 MED ORDER — DEXAMETHASONE SODIUM PHOSPHATE 10 MG/ML IJ SOLN
10.0000 mg | Freq: Once | INTRAMUSCULAR | Status: AC
  Administered 2019-12-22: 10 mg via INTRAVENOUS
  Filled 2019-12-22: qty 1

## 2019-12-22 MED ORDER — SODIUM CHLORIDE 0.9 % IV SOLN
100.0000 mg | Freq: Once | INTRAVENOUS | Status: AC
  Administered 2019-12-22: 100 mg via INTRAVENOUS
  Filled 2019-12-22: qty 20

## 2019-12-22 MED ORDER — SODIUM CHLORIDE 0.9 % IV SOLN
100.0000 mg | Freq: Once | INTRAVENOUS | Status: AC
  Administered 2019-12-23: 100 mg via INTRAVENOUS
  Filled 2019-12-22: qty 20

## 2019-12-22 NOTE — ED Triage Notes (Signed)
Pt brought in by EMS from home  Pt tested positive for covid  Pt was tested Thursday and got her results on Monday  Pt is c/o general malaise and shortness of breath  Pt's O2 sat on room air was 87%  Oxygen applied at 4 liters/min via [place] and sat increased to 92%   Pt states she felt better with the oxygen

## 2019-12-22 NOTE — ED Provider Notes (Signed)
[place] EMERGENCY DEPARTMENT Provider Note   CSN: [id number] Arrival date & time: 01/02/2020  2125     History Chief Complaint  Patient presents with  . Covid positive    Kathryn Richardson is a 85 y.o. female hx of afib not on blood thinner, CKD, DM, here with SOB, hypoxia. Patient states that she tested positive for Covid 4 days ago. She went for testing a week ago [facility]. She states that 3 days ago she went to the infusion center and had monoclonal antibody infusion. She states that since yesterday she has worsening shortness of breath. She also has some chills as well. EMS went out there and she was hypoxic to 87%. She is not on oxygen at baseline.   The history is provided by the patient.       Past Medical History:  Diagnosis Date  . Allergic rhinitis   . Atrial fibrillation ([place])   . Chronic renal disease, stage IV ([place])   . Essential hypertension   . GERD (gastroesophageal reflux disease)   . Gout   . Hyperlipemia, mixed   . Insomnia   . Type 2 diabetes mellitus [facility])     Patient Active Problem List   Diagnosis Date Noted  . Sinus node dysfunction ([place]) 06/23/2019  . Status post dilation of esophageal narrowing 07/08/2016  . Visit for monitoring Tikosyn therapy 06/16/2016  . Pacemaker 08/01/2014  . Uncontrolled diabetes mellitus ([place]) 05/26/2014  . Acute renal failure superimposed on stage 3 chronic kidney disease ([place]) 05/26/2014  . Essential hypertension 05/26/2014  . Hyperkalemia 05/26/2014  . GERD (gastroesophageal reflux disease) 05/26/2014  . Hypothyroidism 05/26/2014  . Gout 04/26/2014  . Atrial fibrillation with rapid ventricular response ([place]) 04/26/2014  . Syncope 04/25/2014  . Atrial flutter with rapid ventricular response ([place]) 04/25/2014  . Chronic kidney disease, stage III (moderate) 10/05/2013  . Encounter for therapeutic drug monitoring 06/06/2013  . Essential hypertension, benign 03/25/2013  . Long term current use of  anticoagulant therapy 03/25/2013  . Hyperlipidemia 03/25/2013  . Paroxysmal atrial fibrillation 09/03/2011  . Bradycardia 09/03/2011  . Allergic rhinitis 02/09/2011  . Diabetes mellitus ([place]) 02/09/2011  . Vitamin D deficiency 02/09/2011    Past Surgical History:  Procedure Laterality Date  . CARDIOVERSION N/A 05/29/2014   Procedure: CARDIOVERSION;  Surgeon: Fay Records, MD;  Location: [facility] ENDOSCOPY;  Service: Cardiovascular;  Laterality: N/A;  . CARDIOVERSION N/A 11/03/2014   Procedure: CARDIOVERSION;  Surgeon: Thayer Headings, MD;  Location: [place];  Service: Cardiovascular;  Laterality: N/A;  . CARDIOVERSION N/A 04/14/2016   Procedure: CARDIOVERSION;  Surgeon: Jerline Pain, MD;  Location: [facility] ENDOSCOPY;  Service: Cardiovascular;  Laterality: N/A;  . CARDIOVERSION N/A 08/15/2019   Procedure: CARDIOVERSION;  Surgeon: [name], MD;  Location: [place];  Service: Cardiovascular;  Laterality: N/A;  . CESAREAN SECTION     x2  . ESOPHAGEAL DILATION    . PERMANENT PACEMAKER INSERTION N/A 04/26/2014   Procedure: PERMANENT PACEMAKER INSERTION;  Surgeon: Evans Lance, MD;  Location: [facility] CATH LAB;  Service: Cardiovascular;  Laterality: N/A;  . TEE WITHOUT CARDIOVERSION N/A 05/29/2014   Procedure: TRANSESOPHAGEAL ECHOCARDIOGRAM (TEE);  Surgeon: Fay Records, MD;  Location: [facility] ENDOSCOPY;  Service: Cardiovascular;  Laterality: N/A;     OB History   No obstetric history on file.     Family History  Problem Relation Age of Onset  . Coronary artery disease Father 60  . Heart attack Father   .  Congestive Heart Failure Mother   . Alzheimer's disease Mother     Social History   Tobacco Use  . Smoking status: Never Smoker  . Smokeless tobacco: Never Used  Substance Use Topics  . Alcohol use: No    Alcohol/week: 0.0 standard drinks  . Drug use: No    Home Medications Prior to Admission medications   Medication Sig Start Date End Date Taking? Authorizing Provider    allopurinol (ZYLOPRIM) 100 MG tablet Take 200 mg by mouth at bedtime.     [provider]  atorvastatin (LIPITOR) 80 MG tablet Take 80 mg by mouth at bedtime.     [provider]  benazepril (LOTENSIN) 20 MG tablet Take 20 mg by mouth daily.    [provider]  diltiazem (CARDIZEM CD) 360 MG 24 hr capsule TAKE 1 CAPSULE BY MOUTH EVERY DAY 11/24/19   Evans Lance, MD  diltiazem (CARDIZEM) 30 MG tablet Take 1 tablet every 4 hours AS NEEDED for afib heart rate 90 or greater. 11/24/19   Evans Lance, MD  ELIQUIS 2.5 MG TABS tablet TAKE 1 TABLET BY MOUTH TWICE A DAY 11/03/19   Evans Lance, MD  furosemide (LASIX) 40 MG tablet Take 1.5 tablets (60 mg total) by mouth daily. 09/16/19   Evans Lance, MD  KLOR-CON M20 20 MEQ tablet TAKE 1.5 TABLETS BY MOUTH EVERY DAY 11/22/19   Evans Lance, MD  LANTUS SOLOSTAR 100 UNIT/ML Solostar Pen Inject 62 Units into the skin daily. 10/10/18   [provider]  levothyroxine (SYNTHROID, LEVOTHROID) 50 MCG tablet Take 50 mcg by mouth daily. On a empty stomach.    [provider]  metoprolol succinate (TOPROL XL) 25 MG 24 hr tablet Take 0.5 tablets (12.5 mg total) by mouth in the morning and at bedtime. 10/03/19   Evans Lance, MD  rosuvastatin (CRESTOR) 20 MG tablet Take 1 tablet by mouth daily. 08/27/18   [provider]    Allergies    Cortisone and Prednisone  Review of Systems   Review of Systems  Respiratory: Positive for shortness of breath.   All other systems reviewed and are negative.   Physical Exam Updated Vital Signs BP (!) 133/97 (BP Location: Right Arm)   Pulse 76   Temp 98.5 F (36.9 C) (Oral)   Resp 18   SpO2 95%   Physical Exam Vitals and nursing note reviewed.  Constitutional:      Comments: tachypneic   HENT:     Head: Normocephalic.     Nose: Nose normal.     Mouth/Throat:     Mouth: Mucous membranes are dry.  Eyes:     Extraocular Movements: Extraocular movements  intact.     Pupils: Pupils are equal, round, and reactive to light.  Cardiovascular:     Rate and Rhythm: Normal rate and regular rhythm.     Pulses: Normal pulses.     Heart sounds: Normal heart sounds.  Pulmonary:     Comments: Tachypneic, crackles bilateral bases  Abdominal:     General: Abdomen is flat.     Palpations: Abdomen is soft.  Musculoskeletal:        General: Normal range of motion.     Cervical back: Normal range of motion.  Skin:    General: Skin is warm.     Capillary Refill: Capillary refill takes less than 2 seconds.  Neurological:     General: No focal deficit present.  Mental Status: She is alert.  Psychiatric:        Mood and Affect: Mood normal.        Behavior: Behavior normal.     ED Results / Procedures / Treatments   Labs (all labs ordered are listed, but only abnormal results are displayed) Labs Reviewed  CBC WITH DIFFERENTIAL/PLATELET - Abnormal; Notable for the following components:      Result Value   Neutro Abs 8.6 (*)    Lymphs Abs 0.6 (*)    Abs Immature Granulocytes 0.08 (*)    All other components within normal limits  COMPREHENSIVE METABOLIC PANEL - Abnormal; Notable for the following components:   Sodium 132 (*)    Chloride 97 (*)    Glucose, Bld 331 (*)    BUN 32 (*)    Creatinine, Ser 1.76 (*)    Calcium 7.9 (*)    Total Protein 6.4 (*)    Albumin 2.8 (*)    GFR calc non Af Amer 26 (*)    GFR calc Af Amer 30 (*)    All other components within normal limits  D-DIMER, QUANTITATIVE (NOT AT [facility]) - Abnormal; Notable for the following components:   D-Dimer, Quant 0.72 (*)    All other components within normal limits  SARS CORONAVIRUS 2 BY RT PCR (HOSPITAL ORDER, [place] LAB)  CULTURE, BLOOD (ROUTINE X 2)  CULTURE, BLOOD (ROUTINE X 2)  LACTIC ACID, PLASMA  LACTIC ACID, PLASMA  PROCALCITONIN  LACTATE DEHYDROGENASE  FERRITIN  TRIGLYCERIDES  FIBRINOGEN  C-REACTIVE PROTEIN  BLOOD GAS, VENOUS     EKG None  Radiology DG Chest Port 1 View  Result Date: 12/24/2019 CLINICAL DATA:  COVID, hypoxic EXAM: PORTABLE CHEST 1 VIEW COMPARISON:  05/16/2016 FINDINGS: Left pacer remains in place, unchanged. Cardiomegaly. Patchy airspace opacities throughout the right lung and in the left base paddle with pneumonia. No effusions. No acute bony abnormality. IMPRESSION: Patchy bilateral airspace opacities, right greater than left compatible with pneumonia, likely COVID pneumonia. Electronically Signed   By: Rolm Baptise M.D.   On: 01/04/2020 21:51    Procedures Procedures (including critical care time)  CRITICAL CARE Performed by: Wandra Arthurs   Total critical care time: 30 minutes  Critical care time was exclusive of separately billable procedures and treating other patients.  Critical care was necessary to treat or prevent imminent or life-threatening deterioration.  Critical care was time spent personally by me on the following activities: development of treatment plan with patient and/or surrogate as well as nursing, discussions with consultants, evaluation of patient's response to treatment, examination of patient, obtaining history from patient or surrogate, ordering and performing treatments and interventions, ordering and review of laboratory studies, ordering and review of radiographic studies, pulse oximetry and re-evaluation of patient's condition.   Medications Ordered in ED Medications  albuterol (VENTOLIN HFA) 108 (90 Base) MCG/ACT inhaler 2 puff (2 puffs Inhalation Given 01/07/2020 2226)  dexamethasone (DECADRON) injection 10 mg (10 mg Intravenous Given 12/24/2019 2226)    ED Course  I have reviewed the triage vital signs and the nursing notes.  Pertinent labs & imaging results that were available during my care of the patient were reviewed by me and considered in my medical decision making (see chart for details).    MDM Rules/Calculators/A&P                          Kathryn Richardson is a  85 y.o. female here presenting with shortness of breath. Patient was vaccinated but still contracted Covid and received monoclonal antibodies ready. Patient was noted to be hypoxic per EMS. Patient was put on 3 L nasal cannula. Will get Covid preadmission labs and chest x-ray and will give steroids and albuterol. Patient will need to be admitted.  10:45 PM Patient's white blood cell count is normal.  Creatinine is 1.7 which is improved from previous.  X-ray showed bilateral opacities consistent with Covid.  Patient is now requiring high flow nasal cannula around 6 to 7 L.  Will admit for hypoxia from Covid.   Final Clinical Impression(s) / ED Diagnoses Final diagnoses:  None    Rx / DC Orders ED Discharge Orders    None       Drenda Freeze, MD 12/31/2019 2247

## 2019-12-22 NOTE — ED Notes (Signed)
COVID dx this past Monday, rec Vaccine for COVID, 2 shot in feb 2021, states today felt very short of breath. Strong sm prod cough. No chest pain complaints, has had some nausea, states did have a fever up until yesterday, very poor appetite per pt statement

## 2019-12-23 ENCOUNTER — Encounter (HOSPITAL_BASED_OUTPATIENT_CLINIC_OR_DEPARTMENT_OTHER): Payer: Self-pay | Admitting: Family Medicine

## 2019-12-23 DIAGNOSIS — K449 Diaphragmatic hernia without obstruction or gangrene: Secondary | ICD-10-CM | POA: Diagnosis not present

## 2019-12-23 DIAGNOSIS — D62 Acute posthemorrhagic anemia: Secondary | ICD-10-CM | POA: Diagnosis not present

## 2019-12-23 DIAGNOSIS — J9601 Acute respiratory failure with hypoxia: Secondary | ICD-10-CM

## 2019-12-23 DIAGNOSIS — G4700 Insomnia, unspecified: Secondary | ICD-10-CM | POA: Diagnosis present

## 2019-12-23 DIAGNOSIS — J1289 Other viral pneumonia: Secondary | ICD-10-CM | POA: Diagnosis not present

## 2019-12-23 DIAGNOSIS — E1165 Type 2 diabetes mellitus with hyperglycemia: Secondary | ICD-10-CM | POA: Diagnosis present

## 2019-12-23 DIAGNOSIS — R112 Nausea with vomiting, unspecified: Secondary | ICD-10-CM | POA: Diagnosis not present

## 2019-12-23 DIAGNOSIS — D7389 Other diseases of spleen: Secondary | ICD-10-CM | POA: Diagnosis not present

## 2019-12-23 DIAGNOSIS — N183 Chronic kidney disease, stage 3 unspecified: Secondary | ICD-10-CM | POA: Diagnosis not present

## 2019-12-23 DIAGNOSIS — N179 Acute kidney failure, unspecified: Secondary | ICD-10-CM | POA: Diagnosis not present

## 2019-12-23 DIAGNOSIS — R531 Weakness: Secondary | ICD-10-CM | POA: Diagnosis not present

## 2019-12-23 DIAGNOSIS — J90 Pleural effusion, not elsewhere classified: Secondary | ICD-10-CM | POA: Diagnosis not present

## 2019-12-23 DIAGNOSIS — I517 Cardiomegaly: Secondary | ICD-10-CM | POA: Diagnosis not present

## 2019-12-23 DIAGNOSIS — K219 Gastro-esophageal reflux disease without esophagitis: Secondary | ICD-10-CM | POA: Diagnosis present

## 2019-12-23 DIAGNOSIS — M109 Gout, unspecified: Secondary | ICD-10-CM | POA: Diagnosis present

## 2019-12-23 DIAGNOSIS — J9690 Respiratory failure, unspecified, unspecified whether with hypoxia or hypercapnia: Secondary | ICD-10-CM | POA: Diagnosis not present

## 2019-12-23 DIAGNOSIS — J189 Pneumonia, unspecified organism: Secondary | ICD-10-CM | POA: Diagnosis not present

## 2019-12-23 DIAGNOSIS — E782 Mixed hyperlipidemia: Secondary | ICD-10-CM | POA: Diagnosis present

## 2019-12-23 DIAGNOSIS — E46 Unspecified protein-calorie malnutrition: Secondary | ICD-10-CM | POA: Diagnosis not present

## 2019-12-23 DIAGNOSIS — U071 COVID-19: Secondary | ICD-10-CM | POA: Diagnosis present

## 2019-12-23 DIAGNOSIS — I700 Atherosclerosis of aorta: Secondary | ICD-10-CM | POA: Diagnosis not present

## 2019-12-23 DIAGNOSIS — E1122 Type 2 diabetes mellitus with diabetic chronic kidney disease: Secondary | ICD-10-CM | POA: Diagnosis present

## 2019-12-23 DIAGNOSIS — E873 Alkalosis: Secondary | ICD-10-CM | POA: Diagnosis present

## 2019-12-23 DIAGNOSIS — M5136 Other intervertebral disc degeneration, lumbar region: Secondary | ICD-10-CM | POA: Diagnosis not present

## 2019-12-23 DIAGNOSIS — Z6841 Body Mass Index (BMI) 40.0 and over, adult: Secondary | ICD-10-CM | POA: Diagnosis not present

## 2019-12-23 DIAGNOSIS — I480 Paroxysmal atrial fibrillation: Secondary | ICD-10-CM | POA: Diagnosis present

## 2019-12-23 DIAGNOSIS — M160 Bilateral primary osteoarthritis of hip: Secondary | ICD-10-CM | POA: Diagnosis not present

## 2019-12-23 DIAGNOSIS — E039 Hypothyroidism, unspecified: Secondary | ICD-10-CM | POA: Diagnosis present

## 2019-12-23 DIAGNOSIS — I495 Sick sinus syndrome: Secondary | ICD-10-CM | POA: Diagnosis present

## 2019-12-23 DIAGNOSIS — R0902 Hypoxemia: Secondary | ICD-10-CM | POA: Diagnosis not present

## 2019-12-23 DIAGNOSIS — I361 Nonrheumatic tricuspid (valve) insufficiency: Secondary | ICD-10-CM | POA: Diagnosis not present

## 2019-12-23 DIAGNOSIS — R601 Generalized edema: Secondary | ICD-10-CM | POA: Diagnosis not present

## 2019-12-23 DIAGNOSIS — J1282 Pneumonia due to coronavirus disease 2019: Secondary | ICD-10-CM | POA: Diagnosis present

## 2019-12-23 DIAGNOSIS — E871 Hypo-osmolality and hyponatremia: Secondary | ICD-10-CM | POA: Diagnosis present

## 2019-12-23 DIAGNOSIS — Z66 Do not resuscitate: Secondary | ICD-10-CM | POA: Diagnosis not present

## 2019-12-23 DIAGNOSIS — K5730 Diverticulosis of large intestine without perforation or abscess without bleeding: Secondary | ICD-10-CM | POA: Diagnosis not present

## 2019-12-23 DIAGNOSIS — I4891 Unspecified atrial fibrillation: Secondary | ICD-10-CM | POA: Diagnosis present

## 2019-12-23 DIAGNOSIS — I340 Nonrheumatic mitral (valve) insufficiency: Secondary | ICD-10-CM | POA: Diagnosis not present

## 2019-12-23 DIAGNOSIS — I10 Essential (primary) hypertension: Secondary | ICD-10-CM | POA: Diagnosis not present

## 2019-12-23 DIAGNOSIS — I129 Hypertensive chronic kidney disease with stage 1 through stage 4 chronic kidney disease, or unspecified chronic kidney disease: Secondary | ICD-10-CM | POA: Diagnosis present

## 2019-12-23 DIAGNOSIS — Z515 Encounter for palliative care: Secondary | ICD-10-CM | POA: Diagnosis not present

## 2019-12-23 DIAGNOSIS — N184 Chronic kidney disease, stage 4 (severe): Secondary | ICD-10-CM | POA: Diagnosis present

## 2019-12-23 DIAGNOSIS — E872 Acidosis: Secondary | ICD-10-CM | POA: Diagnosis present

## 2019-12-23 LAB — C-REACTIVE PROTEIN: CRP: 9 mg/dL — ABNORMAL HIGH (ref <1.0)

## 2019-12-23 LAB — PROCALCITONIN: Procalcitonin: 0.75 ng/mL

## 2019-12-23 LAB — CBG MONITORING, ED
Glucose-Capillary: 508 mg/dL (ref 70–99)
Glucose-Capillary: 533 mg/dL (ref 70–99)
Glucose-Capillary: 484 mg/dL — ABNORMAL HIGH (ref 70–99)

## 2019-12-23 LAB — LACTATE DEHYDROGENASE: LDH: 371 U/L — ABNORMAL HIGH (ref 98–192)

## 2019-12-23 LAB — FERRITIN: Ferritin: 315 ng/mL — ABNORMAL HIGH (ref 11–307)

## 2019-12-23 LAB — FIBRINOGEN: Fibrinogen: 728 mg/dL — ABNORMAL HIGH (ref 210–475)

## 2019-12-23 LAB — TRIGLYCERIDES: Triglycerides: 109 mg/dL (ref <150)

## 2019-12-23 MED ORDER — INSULIN ASPART 100 UNIT/ML ~~LOC~~ SOLN
17.0000 [IU] | Freq: Once | SUBCUTANEOUS | Status: AC
  Administered 2019-12-23: 17 [IU] via SUBCUTANEOUS
  Filled 2019-12-23: qty 0.17

## 2019-12-23 MED ORDER — ALBUTEROL SULFATE HFA 108 (90 BASE) MCG/ACT IN AERS
2.0000 | INHALATION_SPRAY | RESPIRATORY_TRACT | Status: DC | PRN
  Filled 2019-12-23: qty 6.7

## 2019-12-23 MED ORDER — ACETAMINOPHEN 325 MG PO TABS: 650.0000 mg | ORAL_TABLET | Freq: Four times a day (QID) | ORAL | Status: DC | PRN

## 2019-12-23 MED ORDER — INSULIN ASPART 100 UNIT/ML ~~LOC~~ SOLN
0.0000 [IU] | Freq: Every day | SUBCUTANEOUS | Status: DC
  Filled 2019-12-23: qty 0.05

## 2019-12-23 MED ORDER — HYDROCOD POLST-CPM POLST ER 10-8 MG/5ML PO SUER
5.0000 mL | Freq: Two times a day (BID) | ORAL | Status: DC | PRN
  Administered 2019-12-25 – 2020-01-03 (×10): 5 mL via ORAL
  Filled 2019-12-23 (×10): qty 5

## 2019-12-23 MED ORDER — ATORVASTATIN CALCIUM 80 MG PO TABS
80.0000 mg | ORAL_TABLET | Freq: Every day | ORAL | Status: DC
  Administered 2019-12-23 – 2020-01-07 (×15): 80 mg via ORAL
  Filled 2019-12-23 (×8): qty 2
  Filled 2019-12-23 (×2): qty 1
  Filled 2019-12-23 (×3): qty 2
  Filled 2019-12-23: qty 1
  Filled 2019-12-23: qty 2

## 2019-12-23 MED ORDER — ONDANSETRON HCL 4 MG/2ML IJ SOLN
4.0000 mg | Freq: Four times a day (QID) | INTRAMUSCULAR | Status: DC | PRN
  Administered 2020-01-03 – 2020-01-05 (×5): 4 mg via INTRAVENOUS
  Filled 2019-12-23 (×5): qty 2

## 2019-12-23 MED ORDER — GUAIFENESIN-DM 100-10 MG/5ML PO SYRP
10.0000 mL | ORAL_SOLUTION | ORAL | Status: DC | PRN
  Administered 2019-12-28 – 2020-01-01 (×4): 10 mL via ORAL
  Filled 2019-12-23 (×4): qty 10

## 2019-12-23 MED ORDER — LEVOTHYROXINE SODIUM 50 MCG PO TABS
50.0000 ug | ORAL_TABLET | Freq: Every day | ORAL | Status: DC
  Administered 2019-12-23 – 2019-12-24 (×2): 50 ug via ORAL
  Filled 2019-12-23 (×2): qty 1

## 2019-12-23 MED ORDER — INSULIN ASPART 100 UNIT/ML ~~LOC~~ SOLN
0.0000 [IU] | Freq: Three times a day (TID) | SUBCUTANEOUS | Status: DC
  Administered 2019-12-24: 11 [IU] via SUBCUTANEOUS
  Administered 2019-12-24: 3 [IU] via SUBCUTANEOUS
  Filled 2019-12-23: qty 0.15

## 2019-12-23 MED ORDER — OXYCODONE HCL 5 MG PO TABS: 5.0000 mg | ORAL_TABLET | ORAL | Status: DC | PRN

## 2019-12-23 MED ORDER — INSULIN GLARGINE 100 UNIT/ML ~~LOC~~ SOLN
62.0000 [IU] | Freq: Every day | SUBCUTANEOUS | Status: DC
  Administered 2019-12-23 – 2019-12-26 (×4): 62 [IU] via SUBCUTANEOUS
  Filled 2019-12-23 (×4): qty 0.62

## 2019-12-23 MED ORDER — ONDANSETRON HCL 4 MG PO TABS
4.0000 mg | ORAL_TABLET | Freq: Four times a day (QID) | ORAL | Status: DC | PRN
  Administered 2019-12-28: 4 mg via ORAL
  Filled 2019-12-23: qty 1

## 2019-12-23 MED ORDER — DILTIAZEM HCL 60 MG PO TABS
30.0000 mg | ORAL_TABLET | ORAL | Status: DC | PRN
  Administered 2020-01-02: 30 mg via ORAL
  Filled 2019-12-23: qty 1

## 2019-12-23 MED ORDER — DEXAMETHASONE SODIUM PHOSPHATE 10 MG/ML IJ SOLN
6.0000 mg | INTRAMUSCULAR | Status: DC
  Administered 2019-12-23: 6 mg via INTRAVENOUS
  Filled 2019-12-23: qty 1

## 2019-12-23 MED ORDER — SODIUM CHLORIDE 0.9 % IV SOLN: INTRAVENOUS | Status: AC

## 2019-12-23 MED ORDER — TRAZODONE HCL 50 MG PO TABS
25.0000 mg | ORAL_TABLET | Freq: Every evening | ORAL | Status: DC | PRN
  Administered 2019-12-24 – 2019-12-27 (×4): 25 mg via ORAL
  Filled 2019-12-23 (×4): qty 1

## 2019-12-23 MED ORDER — POLYETHYLENE GLYCOL 3350 17 G PO PACK
17.0000 g | PACK | Freq: Every day | ORAL | Status: DC | PRN
  Administered 2019-12-27: 17 g via ORAL
  Filled 2019-12-23: qty 1

## 2019-12-23 MED ORDER — DILTIAZEM HCL ER COATED BEADS 180 MG PO CP24
360.0000 mg | ORAL_CAPSULE | Freq: Every day | ORAL | Status: DC
  Administered 2019-12-23 – 2020-01-06 (×15): 360 mg via ORAL
  Filled 2019-12-23 (×15): qty 2

## 2019-12-23 MED ORDER — BENAZEPRIL HCL 20 MG PO TABS
20.0000 mg | ORAL_TABLET | Freq: Every day | ORAL | Status: DC
  Administered 2019-12-23 – 2019-12-26 (×4): 20 mg via ORAL
  Filled 2019-12-23 (×4): qty 1

## 2019-12-23 MED ORDER — METOPROLOL SUCCINATE ER 25 MG PO TB24
25.0000 mg | ORAL_TABLET | Freq: Every day | ORAL | Status: DC
  Administered 2019-12-23 – 2020-01-06 (×15): 25 mg via ORAL
  Filled 2019-12-23 (×15): qty 1

## 2019-12-23 MED ORDER — APIXABAN 2.5 MG PO TABS
2.5000 mg | ORAL_TABLET | Freq: Two times a day (BID) | ORAL | Status: DC
  Administered 2019-12-23 – 2020-01-04 (×23): 2.5 mg via ORAL
  Filled 2019-12-23 (×24): qty 1

## 2019-12-23 NOTE — H&P (Signed)
TRH H&P    Patient Demographics:    Kathryn Richardson, is a 85 y.o. female  MRN: [id number]  DOB - 1934-12-25  Admit Date - 12/12/2019  Referring MD/NP/PA: [facility]  Outpatient Primary MD for the patient is [name], MD  Patient coming from: Home  Chief complaint- dyspnea   HPI:    Kathryn Richardson  is a 85 y.o. female, with history of type 2 diabetes mellitus, hyperlipidemia, GERD, hypertension, chronic renal disease stage IV, atrial fibrillation, and more presents to the ED with a chief complaint of difficulty breathing.  Patient reports symptoms started 9 days ago.  She had Covid test 8 days ago and it was positive.  She reports that she had monoclonal antibody infusion on 10 August.  She has had progressively worsening shortness of breath over these 8 days.  Her oxygen level was low today, so she came into the ER.  She reports that she had a cough productive of off-white phlegm at the onset of her symptoms, but the cough is mostly improved.  She also reports that she had body aches that have subsided.  She was having diaphoresis but that has also subsided.  Patient reports decrease in appetite, but no nausea vomiting.  She reports that she did have diarrhea for several days at the onset of symptoms.  She denies chest pains but admits to palpitations especially with movement.  Palpitations are not all that are normal for her as she does have a history of A. fib.  Patient is fully vaccinated for Covid and had her second shot in February.  In the ED Temperature 97.7, blood pressure 164/68, heart rate 102, respiratory rate 28, satting 97% on 6 L nasal cannula VBG shows a pH of 7.5, PCO2 33.8-respiratory alkalosis White blood cell count 9.8 CHEM panel shows mild hyponatremia at 132 that corrects for hyperglycemia of 331.  Elevated creatinine at 1.76 that is at her baseline  inflammatory markers reveal D-dimer 0.72,  fibrinogen 728, LDH 371, triglycerides 109, ferritin 315,, lactic acid 2.1, pro-Cal 0.75 Patient was started on remdesivir, Decadron, albuterol Transfer from [facility]    Review of systems:    Review of Systems  Constitutional: Positive for chills, diaphoresis, fever and malaise/fatigue. Negative for weight loss.  HENT: Negative for congestion, sinus pain and sore throat.   Eyes: Negative for blurred vision and double vision.  Respiratory: Positive for cough, sputum production and shortness of breath. Negative for wheezing.   Cardiovascular: Positive for palpitations. Negative for chest pain, orthopnea and leg swelling.  Gastrointestinal: Positive for diarrhea. Negative for abdominal pain, blood in stool, constipation, nausea and vomiting.  Genitourinary: Negative for dysuria, frequency and urgency.  Musculoskeletal: Positive for myalgias.  Skin: Negative for itching and rash.  Neurological: Positive for weakness. Negative for loss of consciousness and headaches.  Psychiatric/Behavioral: Negative for substance abuse.    All other systems reviewed and are negative.    Past History of the following :    Past Medical History:  Diagnosis Date  . Allergic rhinitis   .  Atrial fibrillation ([place])   . Chronic renal disease, stage IV ([place])   . Essential hypertension   . GERD (gastroesophageal reflux disease)   . Gout   . Hyperlipemia, mixed   . Insomnia   . Type 2 diabetes mellitus ([place])       Past Surgical History:  Procedure Laterality Date  . CARDIOVERSION N/A 05/29/2014   Procedure: CARDIOVERSION;  Surgeon: Fay Records, MD;  Location: [facility] ENDOSCOPY;  Service: Cardiovascular;  Laterality: N/A;  . CARDIOVERSION N/A 11/03/2014   Procedure: CARDIOVERSION;  Surgeon: Thayer Headings, MD;  Location: [place];  Service: Cardiovascular;  Laterality: N/A;  . CARDIOVERSION N/A 04/14/2016   Procedure: CARDIOVERSION;  Surgeon: Jerline Pain, MD;  Location: [facility] ENDOSCOPY;   Service: Cardiovascular;  Laterality: N/A;  . CARDIOVERSION N/A 08/15/2019   Procedure: CARDIOVERSION;  Surgeon: [name], MD;  Location: [place];  Service: Cardiovascular;  Laterality: N/A;  . CESAREAN SECTION     x2  . ESOPHAGEAL DILATION    . PERMANENT PACEMAKER INSERTION N/A 04/26/2014   Procedure: PERMANENT PACEMAKER INSERTION;  Surgeon: Evans Lance, MD;  Location: [facility] CATH LAB;  Service: Cardiovascular;  Laterality: N/A;  . TEE WITHOUT CARDIOVERSION N/A 05/29/2014   Procedure: TRANSESOPHAGEAL ECHOCARDIOGRAM (TEE);  Surgeon: Fay Records, MD;  Location: [facility] ENDOSCOPY;  Service: Cardiovascular;  Laterality: N/A;      Social History:      Social History   Tobacco Use  . Smoking status: Never Smoker  . Smokeless tobacco: Never Used  Substance Use Topics  . Alcohol use: No    Alcohol/week: 0.0 standard drinks       Family History :     Family History  Problem Relation Age of Onset  . Coronary artery disease Father 60  . Heart attack Father   . Congestive Heart Failure Mother   . Alzheimer's disease Mother       Home Medications:   Prior to Admission medications   Medication Sig Start Date End Date Taking? Authorizing Provider  albuterol (VENTOLIN HFA) 108 (90 Base) MCG/ACT inhaler Inhale 1 puff into the lungs every 6 (six) hours as needed for shortness of breath. 12/14/19   [provider]  allopurinol (ZYLOPRIM) 100 MG tablet Take 200 mg by mouth at bedtime.     [provider]  atorvastatin (LIPITOR) 80 MG tablet Take 80 mg by mouth at bedtime.     [provider]  benazepril (LOTENSIN) 20 MG tablet Take 20 mg by mouth daily.    [provider]  diltiazem (CARDIZEM CD) 360 MG 24 hr capsule TAKE 1 CAPSULE BY MOUTH EVERY DAY 11/24/19   Evans Lance, MD  diltiazem (CARDIZEM) 30 MG tablet Take 1 tablet every 4 hours AS NEEDED for afib heart rate 90 or greater. 11/24/19   Evans Lance, MD  ELIQUIS 2.5 MG TABS tablet  TAKE 1 TABLET BY MOUTH TWICE A DAY 11/03/19   Evans Lance, MD  furosemide (LASIX) 40 MG tablet Take 1.5 tablets (60 mg total) by mouth daily. 09/16/19   Evans Lance, MD  KLOR-CON M20 20 MEQ tablet TAKE 1.5 TABLETS BY MOUTH EVERY DAY 11/22/19   Evans Lance, MD  LANTUS SOLOSTAR 100 UNIT/ML Solostar Pen Inject 62 Units into the skin daily. 10/10/18   [provider]  levothyroxine (SYNTHROID, LEVOTHROID) 50 MCG tablet Take 50 mcg by mouth daily. On a empty stomach.    [provider]  metoprolol succinate (TOPROL  XL) 25 MG 24 hr tablet Take 0.5 tablets (12.5 mg total) by mouth in the morning and at bedtime. 10/03/19   Evans Lance, MD  rosuvastatin (CRESTOR) 20 MG tablet Take 1 tablet by mouth daily. 08/27/18   [provider]     Allergies:     Allergies  Allergen Reactions  . Cortisone Other (See Comments)    Hyperglycemia   . Prednisone Other (See Comments)    Hyperglycemia      Physical Exam:   Vitals  Blood pressure (!) 164/68, pulse (!) 102, temperature 97.7 F (36.5 C), temperature source Oral, resp. rate (!) 28, SpO2 97 %.  1.  General: Supine in bed, NAD  2. Psychiatric: Mood and behavior are normal for situation  3. Neurologic: CN II-XII grossly intact At baseline AOx3 No focal deficits on limited exam  4. HEENMT:  Atraumatic normocephalic Neck supple Trache midline Mucous membranes mildly dry  5. Respiratory : LCTABL with diminished breath sounds in the lower lung fields  6. Cardiovascular : HR tachy Rhythm irregularly irregular No murmur rub or gallop  7. Gastrointestinal:  Abdomen is soft, non distended and non tender to palpation  8. Skin:  Bruising on forearms  9.Musculoskeletal:  No acute deformity, no peripheral edema    Data Review:    CBC Recent Labs  Lab 01/05/2020 2207 01/01/2020 2247  WBC 9.8  --   HGB 14.8 14.6  HCT 43.6 43.0  PLT 181  --   MCV 92.2  --   MCH 31.3  --   MCHC 33.9  --   RDW  15.1  --   LYMPHSABS 0.6*  --   MONOABS 0.5  --   EOSABS 0.0  --   BASOSABS 0.0  --    ------------------------------------------------------------------------------------------------------------------  Results for orders placed or performed during the hospital encounter of 01/05/2020 (from the past 48 hour(s))  SARS Coronavirus 2 by RT PCR (hospital order, performed in [facility] hospital lab) Nasopharyngeal Nasopharyngeal Swab     Status: Abnormal   Collection Time: 12/17/2019 10:07 PM   Specimen: Nasopharyngeal Swab  Result Value Ref Range   SARS Coronavirus 2 POSITIVE (A) NEGATIVE    Comment: RESULT CALLED TO, READ BACK BY AND VERIFIED WITH: LISA ADKINS RN AT 2315 ON 12/31/2019 BY I.[email or web address] (NOTE) SARS-CoV-2 target nucleic acids are DETECTED  SARS-CoV-2 RNA is generally detectable in upper respiratory specimens  during the acute phase of infection.  Positive results are indicative  of the presence of the identified virus, but do not rule out bacterial infection or co-infection with other pathogens not detected by the test.  Clinical correlation with patient history and  other diagnostic information is necessary to determine patient infection status.  The expected result is negative.  Fact Sheet for Patients:   StrictlyIdeas.no   Fact Sheet for Healthcare Providers:   BankingDealers.co.za    This test is not yet approved or cleared by the [place] FDA and  has been authorized for detection and/or diagnosis of SARS-CoV-2 by FDA under an Emergency Use Authorization (EUA).  This EUA will remain in effect (mea ning this test can be used) for the duration of  the COVID-19 declaration under Section 564(b)(1) of the Act, 21 U.S.C. section 360-bbb-3(b)(1), unless the authorization is terminated or revoked sooner.  Performed at [facility], [address] [place], [place] [address]   Lactic acid, plasma     Status:  Abnormal   Collection Time: 12/23/2019 10:07  PM  Result Value Ref Range   Lactic Acid, Venous 2.1 (HH) 0.5 - 1.9 mmol/L    Comment: CRITICAL RESULT CALLED TO, READ BACK BY AND VERIFIED WITH: ADKINS,L AT 2251 ON [id number] BY CHERESNOWSKY,T Performed at [facility], [address] [place], [place] [address]   CBC WITH DIFFERENTIAL     Status: Abnormal   Collection Time: 01/04/2020 10:07 PM  Result Value Ref Range   WBC 9.8 4.0 - 10.5 K/uL   RBC 4.73 3.87 - 5.11 MIL/uL   Hemoglobin 14.8 12.0 - 15.0 g/dL   HCT 43.6 36 - 46 %   MCV 92.2 80.0 - 100.0 fL   MCH 31.3 26.0 - 34.0 pg   MCHC 33.9 30.0 - 36.0 g/dL   RDW 15.1 11.5 - 15.5 %   Platelets 181 150 - 400 K/uL   nRBC 0.0 0.0 - 0.2 %   Neutrophils Relative % 87 %   Neutro Abs 8.6 (H) 1.7 - 7.7 K/uL   Lymphocytes Relative 7 %   Lymphs Abs 0.6 (L) 0.7 - 4.0 K/uL   Monocytes Relative 5 %   Monocytes Absolute 0.5 0 - 1 K/uL   Eosinophils Relative 0 %   Eosinophils Absolute 0.0 0 - 0 K/uL   Basophils Relative 0 %   Basophils Absolute 0.0 0 - 0 K/uL   Immature Granulocytes 1 %   Abs Immature Granulocytes 0.08 (H) 0.00 - 0.07 K/uL    Comment: Performed at [facility], [address] [place], [place] [address]  Comprehensive metabolic panel     Status: Abnormal   Collection Time: 12/17/2019 10:07 PM  Result Value Ref Range   Sodium 132 (L) 135 - 145 mmol/L   Potassium 3.6 3.5 - 5.1 mmol/L   Chloride 97 (L) 98 - 111 mmol/L   CO2 23 22 - 32 mmol/L   Glucose, Bld 331 (H) 70 - 99 mg/dL    Comment: Glucose reference range applies only to samples taken after fasting for at least 8 hours.   BUN 32 (H) 8 - 23 mg/dL   Creatinine, Ser 1.76 (H) 0.44 - 1.00 mg/dL   Calcium 7.9 (L) 8.9 - 10.3 mg/dL   Total Protein 6.4 (L) 6.5 - 8.1 g/dL   Albumin 2.8 (L) 3.5 - 5.0 g/dL   AST 26 15 - 41 U/L   ALT 20 0 - 44 U/L   Alkaline Phosphatase 47 38 - 126 U/L   Total Bilirubin 1.0 0.3 - 1.2 mg/dL   GFR calc non Af Amer 26 (L) >60  mL/min   GFR calc Af Amer 30 (L) >60 mL/min   Anion gap 12 5 - 15    Comment: Performed at [facility], [address] [place], [place] [address]  D-dimer, quantitative     Status: Abnormal   Collection Time: 12/15/2019 10:07 PM  Result Value Ref Range   D-Dimer, Quant 0.72 (H) 0.00 - 0.50 ug/mL-FEU    Comment: (NOTE) At the manufacturer cut-off of 0.50 ug/mL FEU, this assay has been documented to exclude PE with a sensitivity and negative predictive value of 97 to 99%.  At this time, this assay has not been approved by the FDA to exclude DVT/VTE. Results should be correlated with clinical presentation. Performed at [facility], [address] [place], [place] [address]   Procalcitonin     Status: None   Collection Time: 12/25/2019 10:07 PM  Result  Value Ref Range   Procalcitonin 0.75 ng/mL    Comment:        Interpretation: PCT > 0.5 ng/mL and <= 2 ng/mL: Systemic infection (sepsis) is possible, but other conditions are known to elevate PCT as well. (NOTE)       Sepsis PCT Algorithm           Lower Respiratory Tract                                      Infection PCT Algorithm    ----------------------------     ----------------------------         PCT < 0.25 ng/mL                PCT < 0.10 ng/mL          Strongly encourage             Strongly discourage   discontinuation of antibiotics    initiation of antibiotics    ----------------------------     -----------------------------       PCT 0.25 - 0.50 ng/mL            PCT 0.10 - 0.25 ng/mL               OR       >80% decrease in PCT            Discourage initiation of                                            antibiotics      Encourage discontinuation           of antibiotics    ----------------------------     -----------------------------         PCT >= 0.50 ng/mL              PCT 0.26 - 0.50 ng/mL                AND       <80% decrease in PCT             Encourage initiation of                                              antibiotics       Encourage continuation           of antibiotics    ----------------------------     -----------------------------        PCT >= 0.50 ng/mL                  PCT > 0.50 ng/mL               AND         increase in PCT                  Strongly encourage                                      initiation of antibiotics    Strongly encourage escalation  of antibiotics                                     -----------------------------                                           PCT <= 0.25 ng/mL                                                 OR                                        > 80% decrease in PCT                                      Discontinue / Do not initiate                                             antibiotics  Performed at [facility], [place] [address], [place], [place] [address]   Lactate dehydrogenase     Status: Abnormal   Collection Time: 12/21/2019 10:07 PM  Result Value Ref Range   LDH 371 (H) 98 - 192 U/L    Comment: Performed at [facility], [place] [address], [place], [place] [address]  Ferritin     Status: Abnormal   Collection Time: 12/24/2019 10:07 PM  Result Value Ref Range   Ferritin 315 (H) 11 - 307 ng/mL    Comment: Performed at [facility], [place] [address], [place], [place] [address]  Triglycerides     Status: None   Collection Time: 12/30/2019 10:07 PM  Result Value Ref Range   Triglycerides 109 <150 mg/dL    Comment: Performed at [facility] [address], [place], [place] [address]  Fibrinogen     Status: Abnormal   Collection Time: 01/07/2020 10:07 PM  Result Value Ref Range   Fibrinogen 728 (H) 210 - 475 mg/dL    Comment: Performed at [facility] [address], [place], [place] [address]  C-reactive protein     Status: Abnormal   Collection Time: 01/06/2020 10:07 PM  Result Value Ref Range   CRP 9.0 (H) <1.0 mg/dL    Comment: Performed at [facility] [address], [place], [place] [address]  I-Stat venous blood gas, ED     Status: Abnormal   Collection Time: 01/07/2020 10:47 PM  Result Value Ref Range   pH, Ven 7.495 (H) 7.25 - 7.43   pCO2, Ven 33.8 (L) 44 - 60 mmHg   pO2, Ven 45.0 32 - 45 mmHg   Bicarbonate 26.1 20.0 - 28.0 mmol/L   TCO2 27 22 - 32 mmol/L   O2 Saturation 85.0 %   Acid-Base Excess 3.0 (H) 0.0 - 2.0 mmol/L   Sodium 134 (L) 135 - 145  mmol/L   Potassium 3.4 (L) 3.5 - 5.1 mmol/L   Calcium, Ion 1.07 (L) 1.15 - 1.40 mmol/L   HCT 43.0 36 - 46 %   Hemoglobin 14.6 12.0 - 15.0 g/dL   Patient temperature 98.5 F    Sample type VENOUS   CBG monitoring, ED     Status: Abnormal   Collection Time: 12/23/19  6:51 PM  Result Value Ref Range   Glucose-Capillary 508 (HH) 70 - 99 mg/dL    Comment: Glucose reference range applies only to samples taken after fasting for at least 8 hours.   Comment 1 Notify RN     Chemistries  Recent Labs  Lab 12/13/2019 2207 12/27/2019 2247  NA 132* 134*  K 3.6 3.4*  CL 97*  --   CO2 23  --   GLUCOSE 331*  --   BUN 32*  --   CREATININE 1.76*  --   CALCIUM 7.9*  --   AST 26  --   ALT 20  --   ALKPHOS 47  --   BILITOT 1.0  --    ------------------------------------------------------------------------------------------------------------------  ------------------------------------------------------------------------------------------------------------------ GFR: CrCl cannot be calculated (Unknown ideal weight.). Liver Function Tests: Recent Labs  Lab 12/18/2019 2207  AST 26  ALT 20  ALKPHOS 47  BILITOT 1.0  PROT 6.4*  ALBUMIN 2.8*   No results for input(s): LIPASE, AMYLASE in the last 168 hours. No results for input(s): AMMONIA in the last 168 hours. Coagulation Profile: No results for input(s): INR, PROTIME in the last 168 hours. Cardiac Enzymes: No results for input(s): CKTOTAL, CKMB, CKMBINDEX, TROPONINI in the last 168 hours. BNP (last 3 results) No results for input(s): PROBNP in the  last 8760 hours. HbA1C: No results for input(s): HGBA1C in the last 72 hours. CBG: Recent Labs  Lab 12/23/19 1851  GLUCAP 508*   Lipid Profile: Recent Labs    12/27/2019 2207  TRIG 109   Thyroid Function Tests: No results for input(s): TSH, T4TOTAL, FREET4, T3FREE, THYROIDAB in the last 72 hours. Anemia Panel: Recent Labs    12/26/2019 2207  FERRITIN 315*    --------------------------------------------------------------------------------------------------------------- Urine analysis:    Component Value Date/Time   COLORURINE YELLOW 05/25/2014 1816   APPEARANCEUR CLEAR 05/25/2014 1816   LABSPEC >1.030 (H) 05/25/2014 1816   PHURINE 5.0 05/25/2014 1816   GLUCOSEU >1000 (A) 05/25/2014 1816   HGBUR NEGATIVE 05/25/2014 1816   BILIRUBINUR NEGATIVE 05/25/2014 1816   KETONESUR NEGATIVE 05/25/2014 1816   PROTEINUR NEGATIVE 05/25/2014 1816   UROBILINOGEN 0.2 05/25/2014 1816   NITRITE NEGATIVE 05/25/2014 1816   LEUKOCYTESUR NEGATIVE 05/25/2014 1816      Imaging Results:    DG Chest Port 1 View  Result Date: 01/04/2020 CLINICAL DATA:  COVID, hypoxic EXAM: PORTABLE CHEST 1 VIEW COMPARISON:  05/16/2016 FINDINGS: Left pacer remains in place, unchanged. Cardiomegaly. Patchy airspace opacities throughout the right lung and in the left base paddle with pneumonia. No effusions. No acute bony abnormality. IMPRESSION: Patchy bilateral airspace opacities, right greater than left compatible with pneumonia, likely COVID pneumonia. Electronically Signed   By: Rolm Baptise M.D.   On: 12/15/2019 21:51    My personal review of EKG: Rhythm wandering atrial pacemaker, Rate 79 /min, QTc 485 ,    Assessment & Plan:    Active Problems:   Acute respiratory failure with hypoxia (HCC)   1. Acute hypoxic respiratory failure 1. O2 down to 80s 2. Improved on 6LNC 3. Continue O2 4. Wean off as tolerated 5. 2/2 to  below - see plan below 2. Covid Pneumonia 1. Covid pos 2. CXR showing likely covid  pneumonia 3. LDH 37, Triglycerides 109, Ferritin 315, Lactic 2.1, procal 0.75 4. Continue to trend inflammatory markers 5. Continue remdes and decadron 6. Continue inhalers 7. Continue supportive care with O2 [place] 3. Lactic acidosis 1. LA 2.1 2. Continue gentle hydration 3. Continue to trend 4. Hyperglycemia in the setting of DMII 1. Sliding scale coverage, DM/Heart healthy diet 2. Monitor CBG 5. Elevated Cr 1. At baseline 2. Continue to monitor 6. Respitatory Alkalosis 1. PH 7.49 2. CO2 33.8 3. Tachypnea at 28 4. Continue supportive care with O2 and inhaler 5. Recheck VBG in the AM 7. PAF 1. Continue diltiazem, beta blocker, and eliquis 2. Monitor on tele   DVT Prophylaxis-   Eliquis - SCDs   AM Labs Ordered, also please review Full Orders  Family Communication: No family at bedside Code Status:  Full  Admission status:Inpatient :The appropriate admission status for this patient is INPATIENT. Inpatient status is judged to be reasonable and necessary in order to provide the required intensity of service to ensure the patient's safety. The patient's presenting symptoms, physical exam findings, and initial radiographic and laboratory data in the context of their chronic comorbidities is felt to place them at high risk for further clinical deterioration. Furthermore, it is not anticipated that the patient will be medically stable for discharge from the hospital within 2 midnights of admission. The following factors support the admission status of inpatient.     The patient's presenting symptoms include dyspnea The worrisome physical exam findings include Hypoxia, requiring 6LNC The initial radiographic and laboratory data are worrisome because of Patchy BL airspace opacities, right greater than left compatible with pneumonia, likely covid pneumonia on CXR The chronic co-morbidities include Afib, DMII, obesity, CKD, SSS       * I certify that at the point of admission it is my  clinical judgment that the patient will require inpatient hospital care spanning beyond 2 midnights from the point of admission due to high intensity of service, high risk for further deterioration and high frequency of surveillance required.*  Time spent in minutes : [place]

## 2019-12-23 NOTE — ED Notes (Signed)
Pt came from med center via [facility]. COVID positive. SOB, feeling unwell for 1 week, 6L [place] @ 92%, HR 101. Hx of afib, pacemaker.   2 20's left AC and R. Hand. Had 2 bags of remdesivir. A and O x 4. No family w/ her. Full code. Lungs clear upon auscultation.  Next of kin is husband, larry Buske.

## 2019-12-23 NOTE — ED Notes (Signed)
Updated Daughter on condition and delay of bed placement

## 2019-12-23 NOTE — ED Notes (Signed)
Patient increased to 6L [place] from 4L [place]  due to oxygen desaturation. Patient on monitor and will continue to make changes accordingly. Patient tolerated well.

## 2019-12-23 NOTE — ED Notes (Signed)
Report given to charge nurse at [facility] ed

## 2019-12-23 NOTE — ED Notes (Signed)
Patient transitioned to a 4L [place] with oxygen saturations of 96%. Will continue to monitor and make necessary changes to maintain oxygen saturations. Patient tolerating well.

## 2019-12-23 NOTE — ED Provider Notes (Signed)
Patient seen as a transfer from [facility] ED for admission due to Covid-19 with hypoxia.  Physical Exam  BP (!) 151/79   Pulse (!) 103   Temp 98.3 F (36.8 C)   Resp (!) 28   SpO2 95%   Physical Exam Vitals and nursing note reviewed.  HENT:     Head: Normocephalic.     Nose: Nose normal.  Eyes:     Extraocular Movements: Extraocular movements intact.  Pulmonary:     Effort: Pulmonary effort is normal. No respiratory distress.  Musculoskeletal:        General: Normal range of motion.     Cervical back: Neck supple.  Skin:    Findings: No rash (on exposed skin).  Neurological:     Mental Status: She is alert and oriented to person, place, and time.  Psychiatric:        Mood and Affect: Mood normal.     ED Course/Procedures     Procedures  MDM  Will alert Hospitalist that patient has arrived for evaluation.        Truddie Hidden, MD 12/23/19 1850

## 2019-12-23 NOTE — ED Notes (Signed)
Patient resting comfortably with oxygen saturations of 96-98%. Patient on [facility]. Flow decreased to 5L HFNC with oxygen saturations of 95%. Patient on monitor. Will continue to monitor and will make adjustments as needed.

## 2019-12-24 ENCOUNTER — Encounter (HOSPITAL_COMMUNITY): Payer: Self-pay | Admitting: Family Medicine

## 2019-12-24 DIAGNOSIS — J1282 Pneumonia due to coronavirus disease 2019: Secondary | ICD-10-CM

## 2019-12-24 DIAGNOSIS — U071 COVID-19: Principal | ICD-10-CM

## 2019-12-24 LAB — COMPREHENSIVE METABOLIC PANEL
ALT: 18 U/L (ref 0–44)
ALT: 19 U/L (ref 0–44)
AST: 19 U/L (ref 15–41)
AST: 20 U/L (ref 15–41)
Albumin: 2.5 g/dL — ABNORMAL LOW (ref 3.5–5.0)
Albumin: 2.5 g/dL — ABNORMAL LOW (ref 3.5–5.0)
Alkaline Phosphatase: 41 U/L (ref 38–126)
Alkaline Phosphatase: 48 U/L (ref 38–126)
Anion gap: 10 (ref 5–15)
Anion gap: 13 (ref 5–15)
BUN: 54 mg/dL — ABNORMAL HIGH (ref 8–23)
BUN: 66 mg/dL — ABNORMAL HIGH (ref 8–23)
CO2: 21 mmol/L — ABNORMAL LOW (ref 22–32)
CO2: 19 mmol/L — ABNORMAL LOW (ref 22–32)
Calcium: 7.3 mg/dL — ABNORMAL LOW (ref 8.9–10.3)
Calcium: 7.6 mg/dL — ABNORMAL LOW (ref 8.9–10.3)
Chloride: 97 mmol/L — ABNORMAL LOW (ref 98–111)
Chloride: 100 mmol/L (ref 98–111)
Creatinine, Ser: 1.91 mg/dL — ABNORMAL HIGH (ref 0.44–1.00)
Creatinine, Ser: 2.06 mg/dL — ABNORMAL HIGH (ref 0.44–1.00)
GFR calc Af Amer: 28 mL/min — ABNORMAL LOW (ref >60)
GFR calc Af Amer: 25 mL/min — ABNORMAL LOW (ref >60)
GFR calc non Af Amer: 24 mL/min — ABNORMAL LOW (ref >60)
GFR calc non Af Amer: 22 mL/min — ABNORMAL LOW (ref >60)
Glucose, Bld: 163 mg/dL — ABNORMAL HIGH (ref 70–99)
Glucose, Bld: 398 mg/dL — ABNORMAL HIGH (ref 70–99)
Potassium: 3.5 mmol/L (ref 3.5–5.1)
Potassium: 4.1 mmol/L (ref 3.5–5.1)
Sodium: 128 mmol/L — ABNORMAL LOW (ref 135–145)
Sodium: 132 mmol/L — ABNORMAL LOW (ref 135–145)
Total Bilirubin: 0.7 mg/dL (ref 0.3–1.2)
Total Bilirubin: 0.9 mg/dL (ref 0.3–1.2)
Total Protein: 5.8 g/dL — ABNORMAL LOW (ref 6.5–8.1)
Total Protein: 5.9 g/dL — ABNORMAL LOW (ref 6.5–8.1)

## 2019-12-24 LAB — CBC WITH DIFFERENTIAL/PLATELET
Abs Immature Granulocytes: 0.26 10*3/uL — ABNORMAL HIGH (ref 0.00–0.07)
Basophils Absolute: 0 10*3/uL (ref 0.0–0.1)
Basophils Relative: 0 %
Eosinophils Absolute: 0 10*3/uL (ref 0.0–0.5)
Eosinophils Relative: 0 %
HCT: 42.4 % (ref 36.0–46.0)
Hemoglobin: 14.6 g/dL (ref 12.0–15.0)
Immature Granulocytes: 2 %
Lymphocytes Relative: 5 %
Lymphs Abs: 0.7 10*3/uL (ref 0.7–4.0)
MCH: 31.9 pg (ref 26.0–34.0)
MCHC: 34.4 g/dL (ref 30.0–36.0)
MCV: 92.6 fL (ref 80.0–100.0)
Monocytes Absolute: 0.9 10*3/uL (ref 0.1–1.0)
Monocytes Relative: 6 %
Neutro Abs: 13 10*3/uL — ABNORMAL HIGH (ref 1.7–7.7)
Neutrophils Relative %: 87 %
Platelets: 267 10*3/uL (ref 150–400)
RBC: 4.58 MIL/uL (ref 3.87–5.11)
RDW: 15.4 % (ref 11.5–15.5)
WBC: 14.9 10*3/uL — ABNORMAL HIGH (ref 4.0–10.5)
nRBC: 0 % (ref 0.0–0.2)

## 2019-12-24 LAB — CBG MONITORING, ED
Glucose-Capillary: 197 mg/dL — ABNORMAL HIGH (ref 70–99)
Glucose-Capillary: 344 mg/dL — ABNORMAL HIGH (ref 70–99)
Glucose-Capillary: 405 mg/dL — ABNORMAL HIGH (ref 70–99)

## 2019-12-24 LAB — MAGNESIUM: Magnesium: 2.5 mg/dL — ABNORMAL HIGH (ref 1.7–2.4)

## 2019-12-24 LAB — BLOOD GAS, VENOUS
Acid-Base Excess: 0.7 mmol/L (ref 0.0–2.0)
Bicarbonate: 23.9 mmol/L (ref 20.0–28.0)
O2 Saturation: 83.7 %
Patient temperature: 98.6
pCO2, Ven: 36.2 mmHg — ABNORMAL LOW (ref 44.0–60.0)
pH, Ven: 7.435 — ABNORMAL HIGH (ref 7.250–7.430)
pO2, Ven: 51.3 mmHg — ABNORMAL HIGH (ref 32.0–45.0)

## 2019-12-24 LAB — HIV ANTIBODY (ROUTINE TESTING W REFLEX): HIV Screen 4th Generation wRfx: NONREACTIVE

## 2019-12-24 LAB — FERRITIN: Ferritin: 301 ng/mL (ref 11–307)

## 2019-12-24 LAB — GLUCOSE, CAPILLARY: Glucose-Capillary: 323 mg/dL — ABNORMAL HIGH (ref 70–99)

## 2019-12-24 LAB — D-DIMER, QUANTITATIVE: D-Dimer, Quant: 0.81 ug/mL-FEU — ABNORMAL HIGH (ref 0.00–0.50)

## 2019-12-24 LAB — C-REACTIVE PROTEIN: CRP: 11.3 mg/dL — ABNORMAL HIGH (ref <1.0)

## 2019-12-24 MED ORDER — INSULIN ASPART 100 UNIT/ML ~~LOC~~ SOLN
0.0000 [IU] | Freq: Every day | SUBCUTANEOUS | Status: DC
  Administered 2019-12-24: 4 [IU] via SUBCUTANEOUS
  Administered 2019-12-25: 5 [IU] via SUBCUTANEOUS
  Administered 2019-12-26 – 2019-12-28 (×3): 3 [IU] via SUBCUTANEOUS
  Administered 2019-12-31: 2 [IU] via SUBCUTANEOUS
  Administered 2020-01-01: 3 [IU] via SUBCUTANEOUS
  Filled 2019-12-24: qty 0.05

## 2019-12-24 MED ORDER — SODIUM CHLORIDE 0.9 % IV SOLN
500.0000 mg | INTRAVENOUS | Status: AC
  Administered 2019-12-24 – 2019-12-28 (×5): 500 mg via INTRAVENOUS
  Filled 2019-12-24 (×5): qty 500

## 2019-12-24 MED ORDER — INSULIN ASPART 100 UNIT/ML ~~LOC~~ SOLN
5.0000 [IU] | Freq: Three times a day (TID) | SUBCUTANEOUS | Status: DC
  Administered 2019-12-24 – 2019-12-26 (×6): 5 [IU] via SUBCUTANEOUS
  Filled 2019-12-24: qty 0.05

## 2019-12-24 MED ORDER — LINAGLIPTIN 5 MG PO TABS
5.0000 mg | ORAL_TABLET | Freq: Every day | ORAL | Status: DC
  Administered 2019-12-25 – 2020-01-08 (×15): 5 mg via ORAL
  Filled 2019-12-24 (×17): qty 1

## 2019-12-24 MED ORDER — LEVOTHYROXINE SODIUM 50 MCG PO TABS
50.0000 ug | ORAL_TABLET | Freq: Every day | ORAL | Status: DC
  Administered 2019-12-25 – 2020-01-08 (×15): 50 ug via ORAL
  Filled 2019-12-24 (×15): qty 1

## 2019-12-24 MED ORDER — METHYLPREDNISOLONE SODIUM SUCC 125 MG IJ SOLR
45.0000 mg | Freq: Two times a day (BID) | INTRAMUSCULAR | Status: DC
  Administered 2019-12-24 – 2019-12-31 (×15): 45 mg via INTRAVENOUS
  Filled 2019-12-24 (×15): qty 2

## 2019-12-24 MED ORDER — INSULIN ASPART 100 UNIT/ML ~~LOC~~ SOLN
0.0000 [IU] | Freq: Three times a day (TID) | SUBCUTANEOUS | Status: DC
  Administered 2019-12-24: 20 [IU] via SUBCUTANEOUS
  Administered 2019-12-25: 11 [IU] via SUBCUTANEOUS
  Administered 2019-12-25: 15 [IU] via SUBCUTANEOUS
  Administered 2019-12-25: 20 [IU] via SUBCUTANEOUS
  Administered 2019-12-26: 7 [IU] via SUBCUTANEOUS
  Administered 2019-12-26 – 2019-12-27 (×3): 20 [IU] via SUBCUTANEOUS
  Administered 2019-12-27 (×2): 11 [IU] via SUBCUTANEOUS
  Administered 2019-12-28: 15 [IU] via SUBCUTANEOUS
  Administered 2019-12-28: 7 [IU] via SUBCUTANEOUS
  Administered 2019-12-28: 20 [IU] via SUBCUTANEOUS
  Administered 2019-12-29: 7 [IU] via SUBCUTANEOUS
  Administered 2019-12-29 (×2): 4 [IU] via SUBCUTANEOUS
  Administered 2019-12-30 – 2019-12-31 (×4): 3 [IU] via SUBCUTANEOUS
  Administered 2020-01-01: 7 [IU] via SUBCUTANEOUS
  Administered 2020-01-01: 4 [IU] via SUBCUTANEOUS
  Administered 2020-01-01: 15 [IU] via SUBCUTANEOUS
  Administered 2020-01-03 – 2020-01-05 (×3): 3 [IU] via SUBCUTANEOUS
  Administered 2020-01-06: 4 [IU] via SUBCUTANEOUS
  Administered 2020-01-07 (×2): 3 [IU] via SUBCUTANEOUS
  Filled 2019-12-24: qty 0.2

## 2019-12-24 MED ORDER — SODIUM CHLORIDE 0.9 % IV SOLN
2.0000 g | INTRAVENOUS | Status: AC
  Administered 2019-12-24 – 2019-12-28 (×5): 2 g via INTRAVENOUS
  Filled 2019-12-24: qty 20
  Filled 2019-12-24 (×4): qty 2

## 2019-12-24 NOTE — ED Notes (Addendum)
Called floor to give report, receiving Nurse Carmell Austria in one of covid's room and unable to answer call. Spoke to [place] and requested receiving Nurse to call back once available.

## 2019-12-24 NOTE — Progress Notes (Signed)
PROGRESS NOTE    Kathryn Richardson  ENI:[id number] DOB: 1934-08-19 DOA: 12/16/2019 PCP: [name], MD   Chief Complaint  Patient presents with  . Covid positive    Brief Narrative:   Kathryn Richardson  is Kathryn Richardson 85 y.o. female, with history of type 2 diabetes mellitus, hyperlipidemia, GERD, hypertension, chronic renal disease stage IV, atrial fibrillation, and more presents to the ED with [name] chief complaint of difficulty breathing.  Patient reports symptoms started 9 days ago.  She had Covid test 8 days ago and it was positive.  She reports that she had monoclonal antibody infusion on 10 August.  She has had progressively worsening shortness of breath over these 8 days.  Her oxygen level was low today, so she came into the ER.  She reports that she had Kathryn Richardson cough productive of off-white phlegm at the onset of her symptoms, but the cough is mostly improved.  She also reports that she had body aches that have subsided.  She was having diaphoresis but that has also subsided.  Patient reports decrease in appetite, but no nausea vomiting.  She reports that she did have diarrhea for several days at the onset of symptoms.  She denies chest pains but admits to palpitations especially with movement.  Palpitations are not all that are normal for her as she does have Kathryn Richardson history of Kathryn Richardson. fib.  Patient is fully vaccinated for Covid and had her second shot in February.  In the ED Temperature 97.7, blood pressure 164/68, heart rate 102, respiratory rate 28, satting 97% on 6 L nasal cannula VBG shows Alexxis Mackert pH of 7.5, PCO2 33.8-respiratory alkalosis White blood cell count 9.8 CHEM panel shows mild hyponatremia at 132 that corrects for hyperglycemia of 331.  Elevated creatinine at 1.76 that is at her baseline  inflammatory markers reveal D-dimer 0.72, fibrinogen 728, LDH 371, triglycerides 109, ferritin 315,, lactic acid 2.1, pro-Cal 0.75 Patient was started on remdesivir, Decadron, albuterol Transfer from [place]:   Active Problems:   Acute respiratory failure with hypoxia ([facility])   COVID-19   1. Acute hypoxic respiratory failure 2/2 COVID 19 Pneumonia 1. Vaccinated (2nd dose in February) 2. CXR with patchy bilateral airspace opacities 3. Currently stable on 5 L  4. Solumedrol, remdesivir 5. Consider baricitinib if worsening 6. Procalcitonin elevated, trend -> start empiric abx  7. I/O, daily weights 8. Prone as able, OOB, IS, flutter 9. Follow cultures  COVID-19 Labs  Recent Labs    01/02/2020 2207 12/24/19 0502  DDIMER 0.72* 0.81*  FERRITIN 315* 301  LDH 371*  --   CRP 9.0* 11.3*    Lab Results  Component Value Date   SARSCOV2NAA POSITIVE ([place] Pulse) 12/19/2019   [place] NEGATIVE 08/13/2019   2. Lactic acidosis 1. Mild, follow clinically  3. Hyperglycemia in the setting of DMII 1. Continue lantus, add mealtime, resistant slide, tradjenta  4. CKD IV 1. Creatinine appears at baseline, follow  5. Respitatory Alkalosis 1. PH 7.49 2. CO2 33.8 3. 2/2 hyperventilation, follow clinically  6. PAF 1. Continue diltiazem, beta blocker, and eliquis 2. Monitor on tele  DVT prophylaxis: eliquis Code Status: full  Family Communication: she declined me calling today Disposition:   Status is: Inpatient  Remains inpatient appropriate because:Inpatient level of care appropriate due to severity of illness   Dispo: The patient is from: Home              Anticipated d/c is to: pending  Anticipated d/c date is: > 3 days              Patient currently is not medically stable to d/c.   Consultants:   none  Procedures:  none   Antimicrobials:  Anti-infectives (From admission, onward)   Start     Dose/Rate Route Frequency Ordered Stop   12/24/19 1000  cefTRIAXone (ROCEPHIN) 2 g in sodium chloride 0.9 % 100 mL IVPB     Discontinue     2 g 200 mL/hr over 30 Minutes Intravenous Every 24 hours 12/24/19 0843 12/29/19 0959   12/24/19 1000   azithromycin (ZITHROMAX) 500 mg in sodium chloride 0.9 % 250 mL IVPB     Discontinue     500 mg 250 mL/hr over 60 Minutes Intravenous Every 24 hours 12/24/19 0843 12/29/19 0959   12/23/19 1000  remdesivir 100 mg in sodium chloride 0.9 % 100 mL IVPB     Discontinue    "Followed by" Linked Group Details   100 mg 200 mL/hr over 30 Minutes Intravenous Daily 01/01/2020 2317 12/27/19 0959   12/23/19 0001  remdesivir 100 mg in sodium chloride 0.9 % 100 mL IVPB       "Followed by" Linked Group Details   100 mg 200 mL/hr over 30 Minutes Intravenous  Once 12/12/2019 2317 12/23/19 0224   01/02/2020 2330  remdesivir 100 mg in sodium chloride 0.9 % 100 mL IVPB       "Followed by" Linked Group Details   100 mg 200 mL/hr over 30 Minutes Intravenous  Once 12/15/2019 2317 12/23/19 0052      Subjective: No new complaints Less body aches, fever SOB present, seems Kathryn Richardson little better?  Objective: Vitals:   12/24/19 1353 12/24/19 1433 12/24/19 1556 12/24/19 1714  BP: 113/75 (!) 128/58 130/65 (!) 125/99  Pulse: 62 69 (!) 58 67  Resp: (!) 22 (!) 28 (!) 21 (!) 22  Temp: (!) 97.5 F (36.4 C)  (!) 97.5 F (36.4 C)   TempSrc: Oral  Oral   SpO2: 96% 93% 96% 92%    Intake/Output Summary (Last 24 hours) at 12/24/2019 1742 Last data filed at 12/23/2019 2300 Gross per 24 hour  Intake --  Output 350 ml  Net -350 ml   There were no vitals filed for this visit.  Examination:  General exam: Appears calm and comfortable  Respiratory system: unlabored Cardiovascular system: S1 & S2 heard, RRR.  Gastrointestinal system: Abdomen is nondistended, soft and nontender Central nervous system: Alert and oriented. No focal neurological deficits. Extremities: no LEE Skin: No rashes, lesions or ulcers Psychiatry: Judgement and insight appear normal. Mood & affect appropriate.     Data Reviewed: I have personally reviewed following labs and imaging studies  CBC: Recent Labs  Lab 01/07/2020 2207 01/03/2020 2247  12/24/19 0502  WBC 9.8  --  14.9*  NEUTROABS 8.6*  --  13.0*  HGB 14.8 14.6 14.6  HCT 43.6 43.0 42.4  MCV 92.2  --  92.6  PLT 181  --  854    Basic Metabolic Panel: Recent Labs  Lab 12/14/2019 2207 12/12/2019 2247 12/24/19 0502  NA 132* 134* 128*  K 3.6 3.4* 3.5  CL 97*  --  97*  CO2 23  --  21*  GLUCOSE 331*  --  163*  BUN 32*  --  54*  CREATININE 1.76*  --  1.91*  CALCIUM 7.9*  --  7.3*  MG  --   --  2.5*  GFR: CrCl cannot be calculated (Unknown ideal weight.).  Liver Function Tests: Recent Labs  Lab 01/03/2020 2207 12/24/19 0502  AST 26 19  ALT 20 18  ALKPHOS 47 41  BILITOT 1.0 0.7  PROT 6.4* 5.8*  ALBUMIN 2.8* 2.5*    CBG: Recent Labs  Lab 12/23/19 2040 12/23/19 2256 12/24/19 0752 12/24/19 1201 12/24/19 1645  GLUCAP 533* 484* 197* 344* 405*     Recent Results (from the past 240 hour(s))  SARS Coronavirus 2 by RT PCR (hospital order, performed in [facility] hospital lab) Nasopharyngeal Nasopharyngeal Swab     Status: Abnormal   Collection Time: 12/15/2019 10:07 PM   Specimen: Nasopharyngeal Swab  Result Value Ref Range Status   SARS Coronavirus 2 POSITIVE (Arien Morine) NEGATIVE Final    Comment: RESULT CALLED TO, READ BACK BY AND VERIFIED WITH: LISA ADKINS RN AT 2315 ON 01/06/2020 BY [email or web address] (NOTE) SARS-CoV-2 target nucleic acids are DETECTED  SARS-CoV-2 RNA is generally detectable in upper respiratory specimens  during the acute phase of infection.  Positive results are indicative  of the presence of the identified virus, but do not rule out bacterial infection or co-infection with other pathogens not detected by the test.  Clinical correlation with patient history and  other diagnostic information is necessary to determine patient infection status.  The expected result is negative.  Fact Sheet for Patients:   StrictlyIdeas.no   Fact Sheet for Healthcare Providers:   BankingDealers.co.za    This test is  not yet approved or cleared by the [place] FDA and  has been authorized for detection and/or diagnosis of SARS-CoV-2 by FDA under an Emergency Use Authorization (EUA).  This EUA will remain in effect (mea ning this test can be used) for the duration of  the COVID-19 declaration under Section 564(b)(1) of the Act, 21 U.S.C. section 360-bbb-3(b)(1), unless the authorization is terminated or revoked sooner.  Performed at [facility], [address] [place], [place] [address]   Blood Culture (routine x 2)     Status: None (Preliminary result)   Collection Time: 12/14/2019 10:07 PM   Specimen: BLOOD  Result Value Ref Range Status   Specimen Description   Final    BLOOD BLOOD RIGHT HAND Performed at [facility], [address] [place], [place] [address]    Special Requests   Final    BOTTLES DRAWN AEROBIC AND ANAEROBIC Blood Culture adequate volume Performed at [facility], [address] [place], [place] [address]    Culture   Final    NO GROWTH 1 DAY Performed at [facility], [place] [address] [place], [place] [address]    Report Status PENDING  Incomplete  Blood Culture (routine x 2)     Status: None (Preliminary result)   Collection Time: 01/07/2020 10:07 PM   Specimen: BLOOD  Result Value Ref Range Status   Specimen Description   Final    BLOOD LEFT ANTECUBITAL Performed at [facility], [address] [place], [place] [address]    Special Requests   Final    BOTTLES DRAWN AEROBIC AND ANAEROBIC Blood Culture adequate volume Performed at [facility], [address], [place], [place] [address]    Culture   Final    NO GROWTH 1 DAY Performed at [facility], [place] [address] [place], [place] [address]    Report Status PENDING  Incomplete  Radiology Studies: DG Chest Port 1 View  Result Date: 01/03/2020 CLINICAL DATA:  COVID, hypoxic EXAM: PORTABLE CHEST 1 VIEW COMPARISON:  05/16/2016  FINDINGS: Left pacer remains in place, unchanged. Cardiomegaly. Patchy airspace opacities throughout the right lung and in the left base paddle with pneumonia. No effusions. No acute bony abnormality. IMPRESSION: Patchy bilateral airspace opacities, right greater than left compatible with pneumonia, likely COVID pneumonia. Electronically Signed   By: Rolm Baptise M.D.   On: 01/09/2020 21:51        Scheduled Meds: . apixaban  2.5 mg Oral BID  . atorvastatin  80 mg Oral QHS  . benazepril  20 mg Oral Daily  . diltiazem  360 mg Oral Daily  . insulin aspart  0-20 Units Subcutaneous TID WC  . insulin aspart  0-5 Units Subcutaneous QHS  . insulin aspart  5 Units Subcutaneous TID WC  . insulin glargine  62 Units Subcutaneous Daily  . levothyroxine  50 mcg Oral Daily  . methylPREDNISolone (SOLU-MEDROL) injection  45 mg Intravenous Q12H  . metoprolol succinate  25 mg Oral Daily   Continuous Infusions: . sodium chloride 10 mL/hr at 12/12/2019 2356  . azithromycin Stopped (12/24/19 1357)  . cefTRIAXone (ROCEPHIN)  IV Stopped (12/24/19 1216)  . remdesivir 100 mg in NS 100 mL Stopped (12/24/19 1125)     LOS: 1 day    Time spent: over 30 min    Fayrene Helper, MD [facility]   To contact the attending provider between 7A-7P or the covering provider during after hours 7P-7A, please log into the web site [email or web address] and access using universal [place] password for that web site. If you do not have the password, please call the hospital operator.  12/24/2019, 5:42 PM

## 2019-12-25 ENCOUNTER — Inpatient Hospital Stay (HOSPITAL_COMMUNITY): Payer: Medicare Other

## 2019-12-25 DIAGNOSIS — I361 Nonrheumatic tricuspid (valve) insufficiency: Secondary | ICD-10-CM

## 2019-12-25 DIAGNOSIS — I340 Nonrheumatic mitral (valve) insufficiency: Secondary | ICD-10-CM

## 2019-12-25 LAB — CBC WITH DIFFERENTIAL/PLATELET
Abs Immature Granulocytes: 0.1 10*3/uL — ABNORMAL HIGH (ref 0.00–0.07)
Basophils Absolute: 0 10*3/uL (ref 0.0–0.1)
Basophils Relative: 0 %
Eosinophils Absolute: 0 10*3/uL (ref 0.0–0.5)
Eosinophils Relative: 0 %
HCT: 41 % (ref 36.0–46.0)
Hemoglobin: 13.5 g/dL (ref 12.0–15.0)
Immature Granulocytes: 1 %
Lymphocytes Relative: 4 %
Lymphs Abs: 0.6 10*3/uL — ABNORMAL LOW (ref 0.7–4.0)
MCH: 32.1 pg (ref 26.0–34.0)
MCHC: 32.9 g/dL (ref 30.0–36.0)
MCV: 97.4 fL (ref 80.0–100.0)
Monocytes Absolute: 0.6 10*3/uL (ref 0.1–1.0)
Monocytes Relative: 4 %
Neutro Abs: 12.8 10*3/uL — ABNORMAL HIGH (ref 1.7–7.7)
Neutrophils Relative %: 91 %
Platelets: 257 10*3/uL (ref 150–400)
RBC: 4.21 MIL/uL (ref 3.87–5.11)
RDW: 15.5 % (ref 11.5–15.5)
WBC: 14.1 10*3/uL — ABNORMAL HIGH (ref 4.0–10.5)
nRBC: 0 % (ref 0.0–0.2)

## 2019-12-25 LAB — ECHOCARDIOGRAM COMPLETE
Calc EF: 47.7 %
Single Plane A2C EF: 46.6 %
Single Plane A4C EF: 47.5 %

## 2019-12-25 LAB — C-REACTIVE PROTEIN: CRP: 5.3 mg/dL — ABNORMAL HIGH (ref <1.0)

## 2019-12-25 LAB — FERRITIN: Ferritin: 224 ng/mL (ref 11–307)

## 2019-12-25 LAB — COMPREHENSIVE METABOLIC PANEL
ALT: 18 U/L (ref 0–44)
AST: 18 U/L (ref 15–41)
Albumin: 2.4 g/dL — ABNORMAL LOW (ref 3.5–5.0)
Alkaline Phosphatase: 35 U/L — ABNORMAL LOW (ref 38–126)
Anion gap: 13 (ref 5–15)
BUN: 75 mg/dL — ABNORMAL HIGH (ref 8–23)
CO2: 21 mmol/L — ABNORMAL LOW (ref 22–32)
Calcium: 7.5 mg/dL — ABNORMAL LOW (ref 8.9–10.3)
Chloride: 99 mmol/L (ref 98–111)
Creatinine, Ser: 2.25 mg/dL — ABNORMAL HIGH (ref 0.44–1.00)
GFR calc Af Amer: 23 mL/min — ABNORMAL LOW (ref >60)
GFR calc non Af Amer: 20 mL/min — ABNORMAL LOW (ref >60)
Glucose, Bld: 272 mg/dL — ABNORMAL HIGH (ref 70–99)
Potassium: 4 mmol/L (ref 3.5–5.1)
Sodium: 133 mmol/L — ABNORMAL LOW (ref 135–145)
Total Bilirubin: 1.2 mg/dL (ref 0.3–1.2)
Total Protein: 5.1 g/dL — ABNORMAL LOW (ref 6.5–8.1)

## 2019-12-25 LAB — GLUCOSE, CAPILLARY
Glucose-Capillary: 265 mg/dL — ABNORMAL HIGH (ref 70–99)
Glucose-Capillary: 368 mg/dL — ABNORMAL HIGH (ref 70–99)
Glucose-Capillary: 337 mg/dL — ABNORMAL HIGH (ref 70–99)
Glucose-Capillary: 379 mg/dL — ABNORMAL HIGH (ref 70–99)

## 2019-12-25 LAB — HEMOGLOBIN A1C
Hgb A1c MFr Bld: 10.4 % — ABNORMAL HIGH (ref 4.8–5.6)
Mean Plasma Glucose: 251.78 mg/dL

## 2019-12-25 LAB — BRAIN NATRIURETIC PEPTIDE: B Natriuretic Peptide: 333 pg/mL — ABNORMAL HIGH (ref 0.0–100.0)

## 2019-12-25 LAB — PROCALCITONIN: Procalcitonin: 0.49 ng/mL

## 2019-12-25 LAB — D-DIMER, QUANTITATIVE: D-Dimer, Quant: 0.86 ug/mL-FEU — ABNORMAL HIGH (ref 0.00–0.50)

## 2019-12-25 LAB — MAGNESIUM: Magnesium: 2.5 mg/dL — ABNORMAL HIGH (ref 1.7–2.4)

## 2019-12-25 MED ORDER — FUROSEMIDE 40 MG PO TABS
60.0000 mg | ORAL_TABLET | Freq: Every day | ORAL | Status: DC
  Administered 2019-12-25 – 2019-12-26 (×2): 60 mg via ORAL
  Filled 2019-12-25 (×2): qty 1

## 2019-12-25 NOTE — Evaluation (Addendum)
Physical Therapy Evaluation Patient Details Name: Kathryn Richardson MRN: [id number] DOB: October 10, 1934 Today's Date: 12/25/2019   History of Present Illness  85 yo female admitted with COVID. hx of pacemaker, afib, dm, gout, ckd  Clinical Impression  On eval, pt required Min assist for mobility. She was able to perform sit to stand x 3 and pregait exercises at bedside. Pt tends to give out fairly quickly. O2 84% on 4L [place] with activity. Pt presents with general weakness, decreased activity tolerance, and impaired gait and balance. Will plan to follow and progress activity as tolerated.     Follow Up Recommendations Home health PT;Supervision/Assistance - 24 hour    Equipment Recommendations  Rolling walker with 5" wheels    Recommendations for Other Services       Precautions / Restrictions Precautions Precautions: Fall Precaution Comments: monitor O2 Restrictions Weight Bearing Restrictions: No      Mobility  Bed Mobility Overal bed mobility: Needs Assistance Bed Mobility: Supine to Sit     Supine to sit: HOB elevated;Supervision        Transfers Overall transfer level: Needs assistance Equipment used: None;Rolling walker (2 wheeled) Transfers: Sit to/from Stand Sit to Stand: Min assist         General transfer comment: x3. Once without a device, twice with RW. VCs safety, hand placement. Assist to power up, steady.  Ambulation/Gait Ambulation/Gait assistance: Min assist   Assistive device: Rolling walker (2 wheeled)       General Gait Details: side steps along the side of the bed with RW. Assist to steady pt. Unsteady and fatigues easily.  Stairs            Wheelchair Mobility    Modified Rankin (Stroke Patients Only)       Balance Overall balance assessment: Needs assistance         Standing balance support: Bilateral upper extremity supported Standing balance-Leahy Scale: Poor                               Pertinent  Vitals/Pain Pain Assessment: No/denies pain    Home Living Family/patient expects to be discharged to:: Private residence Living Arrangements: Spouse/significant other Available Help at Discharge: Family Type of Home: House Home Access: Level entry     Home Layout: One level Home Equipment: None      Prior Function Level of Independence: Independent               Hand Dominance        Extremity/Trunk Assessment   Upper Extremity Assessment Upper Extremity Assessment: Generalized weakness    Lower Extremity Assessment Lower Extremity Assessment: Generalized weakness    Cervical / Trunk Assessment Cervical / Trunk Assessment: Normal  Communication   Communication: No difficulties  Cognition Arousal/Alertness: Awake/alert Behavior During Therapy: WFL for tasks assessed/performed Overall Cognitive Status: Within Functional Limits for tasks assessed                                        General Comments      Exercises  Heel Raises, 5 reps, standing Marching, 5 reps, standing   Assessment/Plan    PT Assessment Patient needs continued PT services  PT Problem List Decreased strength;Decreased mobility;Decreased activity tolerance;Decreased balance;Decreased knowledge of use of DME       PT Treatment Interventions DME instruction;Gait  training;Therapeutic activities;Therapeutic exercise;Patient/family education;Balance training;Functional mobility training    PT Goals (Current goals can be found in the Care Plan section)  Acute Rehab PT Goals Patient Stated Goal: to get better PT Goal Formulation: With patient Time For Goal Achievement: 01/27/2020 Potential to Achieve Goals: Good    Frequency Min 3X/week   Barriers to discharge        Co-evaluation               AM-PAC PT "6 Clicks" Mobility  Outcome Measure Help needed turning from your back to your side while in a flat bed without using bedrails?: A Little Help needed  moving from lying on your back to sitting on the side of a flat bed without using bedrails?: A Little Help needed moving to and from a bed to a chair (including a wheelchair)?: A Little Help needed standing up from a chair using your arms (e.g., wheelchair or bedside chair)?: A Little Help needed to walk in hospital room?: A Little Help needed climbing 3-5 steps with a railing? : A Lot 6 Click Score: 17    End of Session Equipment Utilized During Treatment: Gait belt;Oxygen Activity Tolerance: Patient limited by fatigue Patient left: in bed;with call bell/phone within reach;with bed alarm set   PT Visit Diagnosis: Muscle weakness (generalized) (M62.81);Difficulty in walking, not elsewhere classified (R26.2)    Time: 9977-4142 PT Time Calculation (min) (ACUTE ONLY): 22 min   Charges:   PT Evaluation $PT Eval Low Complexity: [place], PT Acute Rehabilitation  Office: [phone] Pager: [phone]

## 2019-12-25 NOTE — Progress Notes (Signed)
  Echocardiogram 2D Echocardiogram has been performed.  Kathryn Richardson M 12/25/2019, 9:21 AM

## 2019-12-25 NOTE — Progress Notes (Addendum)
PROGRESS NOTE    Kathryn Richardson  RUE:[id number] DOB: 31-May-1934 DOA: 01/07/2020 PCP: [name], MD   Chief Complaint  Patient presents with  . Covid positive    Brief Narrative:   Kathryn Richardson  is Kathryn Richardson 85 y.o. female, with history of type 2 diabetes mellitus, hyperlipidemia, GERD, hypertension, chronic renal disease stage IV, atrial fibrillation, and more presents to the ED with Kathryn Richardson chief complaint of difficulty breathing.  Patient reports symptoms started 9 days ago.  She had Covid test 8 days ago and it was positive.  She reports that she had monoclonal antibody infusion on 10 August.  She has had progressively worsening shortness of breath over these 8 days.  Her oxygen level was low today, so she came into the ER.  She reports that she had Kathryn Richardson cough productive of off-white phlegm at the onset of her symptoms, but the cough is mostly improved.  She also reports that she had body aches that have subsided.  She was having diaphoresis but that has also subsided.  Patient reports decrease in appetite, but no nausea vomiting.  She reports that she did have diarrhea for several days at the onset of symptoms.  She denies chest pains but admits to palpitations especially with movement.  Palpitations are not all that are normal for her as she does have Kathryn Richardson history of Kathryn Richardson. fib.  Patient is fully vaccinated for Covid and had her second shot in February.  In the ED Temperature 97.7, blood pressure 164/68, heart rate 102, respiratory rate 28, satting 97% on 6 L nasal cannula VBG shows Kathryn Richardson [place] pH of 7.5, PCO2 33.8-respiratory alkalosis White blood cell count 9.8 CHEM panel shows mild hyponatremia at 132 that corrects for hyperglycemia of 331.  Elevated creatinine at 1.76 that is at her baseline  inflammatory markers reveal D-dimer 0.72, fibrinogen 728, LDH 371, triglycerides 109, ferritin 315,, lactic acid 2.1, pro-Cal 0.75 Patient was started on remdesivir, Decadron, albuterol Transfer from [place]:   Active Problems:   Acute respiratory failure with hypoxia (HCC)   Pneumonia due to COVID-19 virus   1. Acute hypoxic respiratory failure 2/2 COVID 19 Pneumonia 1. Vaccinated (2nd dose in February) 2. CXR with patchy bilateral airspace opacities 3. Currently stable on 4 L  4. S/p regeneron on 8/10 5. Solumedrol, remdesivir 6. Consider baricitinib if worsening 7. Procalcitonin elevated, trend -> start empiric abx  8. I/O, daily weights 9. Prone as able, OOB, IS, flutter 10. Follow cultures  COVID-19 Labs  Recent Labs    01/05/2020 2207 12/24/19 0502 12/25/19 0507 12/25/19 0832  DDIMER 0.72* 0.81* 0.86*  --   FERRITIN 315* 301  --  224  LDH 371*  --   --   --   CRP 9.0* 11.3*  --  5.3*    Lab Results  Component Value Date   SARSCOV2NAA POSITIVE (Kathryn Richardson) 01/03/2020   [facility] NEGATIVE 08/13/2019   2. Lactic acidosis 1. Mild, follow clinically  3. Hyperglycemia in the setting of DMII 1. Continue lantus, add mealtime, resistant slide, tradjenta  4. Elevated BNP  HFpEF: Echo with EF 50-55%, LV hypertrophy.  Mildly reduced systolic function (see report). 1. Resume home lasix, follow  5. CKD IV 1. Appears close to baseline, fluctuating, will follow  6. Respitatory Alkalosis 1. Clinically improving, no tachypnea  7. PAF 1. Continue diltiazem, beta blocker, and eliquis 2. Monitor on tele  DVT prophylaxis: eliquis Code Status: full  Family Communication: will  call daughter 8/15 Disposition:   Status is: Inpatient  Remains inpatient appropriate because:Inpatient level of care appropriate due to severity of illness   Dispo: The patient is from: Home              Anticipated d/c is to: pending              Anticipated d/c date is: > 3 days              Patient currently is not medically stable to d/c.   Consultants:   none  Procedures:  none   Antimicrobials:  Anti-infectives (From admission, onward)   Start     Dose/Rate  Route Frequency Ordered Stop   12/24/19 1000  cefTRIAXone (ROCEPHIN) 2 g in sodium chloride 0.9 % 100 mL IVPB     Discontinue     2 g 200 mL/hr over 30 Minutes Intravenous Every 24 hours 12/24/19 0843 12/29/19 0959   12/24/19 1000  azithromycin (ZITHROMAX) 500 mg in sodium chloride 0.9 % 250 mL IVPB     Discontinue     500 mg 250 mL/hr over 60 Minutes Intravenous Every 24 hours 12/24/19 0843 12/29/19 0959   12/23/19 1000  remdesivir 100 mg in sodium chloride 0.9 % 100 mL IVPB     Discontinue    "Followed by" Linked Group Details   100 mg 200 mL/hr over 30 Minutes Intravenous Daily 12/20/2019 2317 12/27/19 0959   12/23/19 0001  remdesivir 100 mg in sodium chloride 0.9 % 100 mL IVPB       "Followed by" Linked Group Details   100 mg 200 mL/hr over 30 Minutes Intravenous  Once 12/27/2019 2317 12/23/19 0224   01/06/2020 2330  remdesivir 100 mg in sodium chloride 0.9 % 100 mL IVPB       "Followed by" Linked Group Details   100 mg 200 mL/hr over 30 Minutes Intravenous  Once 12/23/2019 2317 12/23/19 0052      Subjective: Feels Kathryn Richardson little better  Objective: Vitals:   12/25/19 0029 12/25/19 0616 12/25/19 0826 12/25/19 1149  BP: 132/72 107/67 (!) 108/58 121/61  Pulse: 69 70 70 85  Resp: (!) 22 16 (!) 24 (!) 23  Temp: 97.7 F (36.5 C) 97.7 F (36.5 C) 97.8 F (36.6 C) 97.7 F (36.5 C)  TempSrc: Oral Oral Oral Oral  SpO2:  90% 90% 90%    Intake/Output Summary (Last 24 hours) at 12/25/2019 1342 Last data filed at 12/25/2019 0500 Gross per 24 hour  Intake 200 ml  Output --  Net 200 ml   There were no vitals filed for this visit.  Examination:  General: No acute distress. Cardiovascular: RRR Lungs: unlabored Abdomen: Soft, nontender, nondistended  Neurological: Alert and oriented 3. Moves all extremities 4. Cranial nerves II through XII grossly intact. Skin: Warm and dry. No rashes or lesions. Extremities: No clubbing or cyanosis. No edema.   Data Reviewed: I have personally reviewed  following labs and imaging studies  CBC: Recent Labs  Lab 12/24/2019 2207 01/01/2020 2247 12/24/19 0502 12/25/19 0507  WBC 9.8  --  14.9* 14.1*  NEUTROABS 8.6*  --  13.0* 12.8*  HGB 14.8 14.6 14.6 13.5  HCT 43.6 43.0 42.4 41.0  MCV 92.2  --  92.6 97.4  PLT 181  --  267 151    Basic Metabolic Panel: Recent Labs  Lab 12/28/2019 2207 01/06/2020 2247 12/24/19 0502 12/24/19 1655 12/25/19 0832  NA 132* 134* 128* 132* 133*  K 3.6 3.4*  3.5 4.1 4.0  CL 97*  --  97* 100 99  CO2 23  --  21* 19* 21*  GLUCOSE 331*  --  163* 398* 272*  BUN 32*  --  54* 66* 75*  CREATININE 1.76*  --  1.91* 2.06* 2.25*  CALCIUM 7.9*  --  7.3* 7.6* 7.5*  MG  --   --  2.5*  --  2.5*    GFR: CrCl cannot be calculated (Unknown ideal weight.).  Liver Function Tests: Recent Labs  Lab 01/07/2020 2207 12/24/19 0502 12/24/19 1655 12/25/19 0832  AST 26 19 20 18   ALT 20 18 19 18   ALKPHOS 47 41 48 35*  BILITOT 1.0 0.7 0.9 1.2  PROT 6.4* 5.8* 5.9* 5.1*  ALBUMIN 2.8* 2.5* 2.5* 2.4*    CBG: Recent Labs  Lab 12/24/19 1201 12/24/19 1645 12/24/19 2121 12/25/19 0823 12/25/19 1146  GLUCAP 344* 405* 323* 265* 368*     Recent Results (from the past 240 hour(s))  SARS Coronavirus 2 by RT PCR (hospital order, performed in [place] hospital lab) Nasopharyngeal Nasopharyngeal Swab     Status: Abnormal   Collection Time: 12/25/2019 10:07 PM   Specimen: Nasopharyngeal Swab  Result Value Ref Range Status   SARS Coronavirus 2 POSITIVE (Kathryn Richardson) NEGATIVE Final    Comment: RESULT CALLED TO, READ BACK BY AND VERIFIED WITH: LISA ADKINS RN AT 2315 ON 12/11/2019 BY I.[email or web address] (NOTE) SARS-CoV-2 target nucleic acids are DETECTED  SARS-CoV-2 RNA is generally detectable in upper respiratory specimens  during the acute phase of infection.  Positive results are indicative  of the presence of the identified virus, but do not rule out bacterial infection or co-infection with other pathogens not detected by the test.  Clinical  correlation with patient history and  other diagnostic information is necessary to determine patient infection status.  The expected result is negative.  Fact Sheet for Patients:   StrictlyIdeas.no   Fact Sheet for Healthcare Providers:   BankingDealers.co.za    This test is not yet approved or cleared by the [place] FDA and  has been authorized for detection and/or diagnosis of SARS-CoV-2 by FDA under an Emergency Use Authorization (EUA).  This EUA will remain in effect (mea ning this test can be used) for the duration of  the COVID-19 declaration under Section 564(b)(1) of the Act, 21 U.S.C. section 360-bbb-3(b)(1), unless the authorization is terminated or revoked sooner.  Performed at [facility], [address] [place], [place] [address]   Blood Culture (routine x 2)     Status: None (Preliminary result)   Collection Time: 12/31/2019 10:07 PM   Specimen: BLOOD  Result Value Ref Range Status   Specimen Description   Final    BLOOD BLOOD RIGHT HAND Performed at [facility], [address] [place], [place] [address]    Special Requests   Final    BOTTLES DRAWN AEROBIC AND ANAEROBIC Blood Culture adequate volume Performed at [facility], [address] [place], [place] [address]    Culture   Final    NO GROWTH 2 DAYS Performed at [facility], [place] [address], [place], [place] [address]    Report Status PENDING  Incomplete  Blood Culture (routine x 2)     Status: None (Preliminary result)   Collection Time: 01/06/2020 10:07 PM   Specimen: BLOOD  Result Value Ref Range Status   Specimen Description   Final    BLOOD  LEFT ANTECUBITAL Performed at [facility], [address] [place], [place] [address]    Special Requests   Final    BOTTLES DRAWN AEROBIC AND ANAEROBIC Blood Culture adequate volume Performed at [facility], [address] [place],  [place] [address]    Culture   Final    NO GROWTH 2 DAYS Performed at [facility], [place] [address] [place], [place] [address]    Report Status PENDING  Incomplete         Radiology Studies: ECHOCARDIOGRAM COMPLETE  Result Date: 12/25/2019    ECHOCARDIOGRAM REPORT   Patient Name:   COSTELLA SCHWARZ Toruno Date of Exam: 12/25/2019 Medical Rec #:  [id number]      Height:       63.0 in Accession #:    [id number]     Weight:       197.4 lb Date of Birth:  June 06, 1934      BSA:          1.923 m Patient Age:    41 years       BP:           108/58 mmHg Patient Gender: F              HR:           70 bpm. Exam Location:  Inpatient Procedure: 2D Echo and Strain Analysis Indications:    Elevated brain natriuretic peptide (BNP) level [166060]  History:        Patient has prior history of Echocardiogram examinations, most                 recent 04/22/2016. Pacemaker, Arrythmias:Atrial Fibrillation;                 Risk Factors:Hypertension, Diabetes and Dyslipidemia. Chronic                 renal disease stage IV, Covid Pneumonia, Acute hypoxic                 respiratory failure.  Sonographer:    Darlina Sicilian RDCS Referring Phys: [phone] Kathryn Richardson [place]  1. Left ventricular ejection fraction, by estimation, is 50 to 55%. The left ventricle has low normal function. The left ventricle has no regional wall motion abnormalities. There is mild concentric left ventricular hypertrophy. Left ventricular diastolic function could not be evaluated. The average left ventricular global longitudinal strain is -7.1 %.  2. Right ventricular systolic function is mildly reduced. The right ventricular size is mildly enlarged. There is normal pulmonary artery systolic pressure. The estimated right ventricular systolic pressure is 74.1 mmHg.  3. Left atrial size was mildly dilated.  4. The mitral valve is degenerative. Mild mitral valve regurgitation. No evidence of mitral stenosis.  5. The aortic valve is tricuspid. Aortic  valve regurgitation is not visualized. No aortic stenosis is present.  6. The inferior vena cava is normal in size with greater than 50% respiratory variability, suggesting right atrial pressure of 3 mmHg. Comparison(s): Prior images unable to be directly viewed, comparison made by report only. No significant change from prior study. FINDINGS  Left Ventricle: Left ventricular ejection fraction, by estimation, is 50 to 55%. The left ventricle has low normal function. The left ventricle has no regional wall motion abnormalities. The average left ventricular global longitudinal strain is -7.1 %.  The left ventricular internal cavity size was normal in size. There is mild concentric left  ventricular hypertrophy. Left ventricular diastolic function could not be evaluated due to atrial fibrillation. Left ventricular diastolic function could not be evaluated. Right Ventricle: The right ventricular size is mildly enlarged. No increase in right ventricular wall thickness. Right ventricular systolic function is mildly reduced. There is normal pulmonary artery systolic pressure. The tricuspid regurgitant velocity  is 2.27 m/s, and with an assumed right atrial pressure of 3 mmHg, the estimated right ventricular systolic pressure is 29.9 mmHg. Left Atrium: Left atrial size was mildly dilated. Right Atrium: Right atrial size was normal in size. Pericardium: Trivial pericardial effusion is present. Mitral Valve: The mitral valve is degenerative in appearance. Mild mitral annular calcification. Mild mitral valve regurgitation. No evidence of mitral valve stenosis. Tricuspid Valve: The tricuspid valve is grossly normal. Tricuspid valve regurgitation is mild . No evidence of tricuspid stenosis. Aortic Valve: The aortic valve is tricuspid. Aortic valve regurgitation is not visualized. No aortic stenosis is present. Pulmonic Valve: The pulmonic valve was grossly normal. Pulmonic valve regurgitation is mild. No evidence of pulmonic  stenosis. Aorta: The aortic root and ascending aorta are structurally normal, with no evidence of dilitation. Venous: The inferior vena cava is normal in size with greater than 50% respiratory variability, suggesting right atrial pressure of 3 mmHg. IAS/Shunts: The atrial septum is grossly normal. Additional Comments: Nashid Pellum pacer wire is visualized in the right atrium and right ventricle.   LV Volumes (MOD) LV vol d, MOD A2C: 88.6 ml LV vol d, MOD A4C: 85.2 ml 2D Longitudinal Strain LV vol s, MOD A2C: 47.3 ml 2D Strain GLS Avg:     -7.1 % LV vol s, MOD A4C: 44.7 ml LV SV MOD A2C:     41.3 ml LV SV MOD A4C:     85.2 ml LV SV MOD BP:      41.7 ml RIGHT ATRIUM           Index RA Area:     15.30 cm RA Volume:   38.00 ml  19.76 ml/m  TRICUSPID VALVE TR Peak grad:   20.6 mmHg TR Vmax:        227.00 cm/s Kathryn Chiquito MD Electronically signed by Kathryn Chiquito MD Signature Date/Time: 12/25/2019/10:32:44 AM    Final         Scheduled Meds: . apixaban  2.5 mg Oral BID  . atorvastatin  80 mg Oral QHS  . benazepril  20 mg Oral Daily  . diltiazem  360 mg Oral Daily  . furosemide  60 mg Oral Daily  . insulin aspart  0-20 Units Subcutaneous TID WC  . insulin aspart  0-5 Units Subcutaneous QHS  . insulin aspart  5 Units Subcutaneous TID WC  . insulin glargine  62 Units Subcutaneous Daily  . levothyroxine  50 mcg Oral Daily  . linagliptin  5 mg Oral Daily  . methylPREDNISolone (SOLU-MEDROL) injection  45 mg Intravenous Q12H  . metoprolol succinate  25 mg Oral Daily   Continuous Infusions: . sodium chloride 10 mL/hr at 01/10/2020 2356  . azithromycin 500 mg (12/25/19 1210)  . cefTRIAXone (ROCEPHIN)  IV 2 g (12/25/19 1206)  . remdesivir 100 mg in NS 100 mL 100 mg (12/25/19 1004)     LOS: 2 days    Time spent: over 24 min    Fayrene Helper, MD [facility]   To contact the attending provider between 7A-7P or the covering provider during after hours 7P-7A, please log into the web site  [email or web address] and access using universal  [place] password for that web site. If you do not have the password, please call the hospital operator.  12/25/2019, 1:42 PM

## 2019-12-26 ENCOUNTER — Inpatient Hospital Stay (HOSPITAL_COMMUNITY): Payer: Medicare Other

## 2019-12-26 DIAGNOSIS — N179 Acute kidney failure, unspecified: Secondary | ICD-10-CM

## 2019-12-26 LAB — COMPREHENSIVE METABOLIC PANEL
ALT: 17 U/L (ref 0–44)
AST: 14 U/L — ABNORMAL LOW (ref 15–41)
Albumin: 2 g/dL — ABNORMAL LOW (ref 3.5–5.0)
Alkaline Phosphatase: 38 U/L (ref 38–126)
Anion gap: 12 (ref 5–15)
BUN: 97 mg/dL — ABNORMAL HIGH (ref 8–23)
CO2: 19 mmol/L — ABNORMAL LOW (ref 22–32)
Calcium: 7.5 mg/dL — ABNORMAL LOW (ref 8.9–10.3)
Chloride: 100 mmol/L (ref 98–111)
Creatinine, Ser: 3.13 mg/dL — ABNORMAL HIGH (ref 0.44–1.00)
GFR calc Af Amer: 15 mL/min — ABNORMAL LOW (ref >60)
GFR calc non Af Amer: 13 mL/min — ABNORMAL LOW (ref >60)
Glucose, Bld: 242 mg/dL — ABNORMAL HIGH (ref 70–99)
Potassium: 3.9 mmol/L (ref 3.5–5.1)
Sodium: 131 mmol/L — ABNORMAL LOW (ref 135–145)
Total Bilirubin: 0.5 mg/dL (ref 0.3–1.2)
Total Protein: 4.9 g/dL — ABNORMAL LOW (ref 6.5–8.1)

## 2019-12-26 LAB — CBC WITH DIFFERENTIAL/PLATELET
Abs Immature Granulocytes: 0.15 10*3/uL — ABNORMAL HIGH (ref 0.00–0.07)
Basophils Absolute: 0 10*3/uL (ref 0.0–0.1)
Basophils Relative: 0 %
Eosinophils Absolute: 0 10*3/uL (ref 0.0–0.5)
Eosinophils Relative: 0 %
HCT: 38.5 % (ref 36.0–46.0)
Hemoglobin: 13.2 g/dL (ref 12.0–15.0)
Immature Granulocytes: 1 %
Lymphocytes Relative: 3 %
Lymphs Abs: 0.5 10*3/uL — ABNORMAL LOW (ref 0.7–4.0)
MCH: 32 pg (ref 26.0–34.0)
MCHC: 34.3 g/dL (ref 30.0–36.0)
MCV: 93.4 fL (ref 80.0–100.0)
Monocytes Absolute: 0.6 10*3/uL (ref 0.1–1.0)
Monocytes Relative: 4 %
Neutro Abs: 13.1 10*3/uL — ABNORMAL HIGH (ref 1.7–7.7)
Neutrophils Relative %: 92 %
Platelets: 300 10*3/uL (ref 150–400)
RBC: 4.12 MIL/uL (ref 3.87–5.11)
RDW: 15.4 % (ref 11.5–15.5)
WBC: 14.3 10*3/uL — ABNORMAL HIGH (ref 4.0–10.5)
nRBC: 0 % (ref 0.0–0.2)

## 2019-12-26 LAB — GLUCOSE, CAPILLARY
Glucose-Capillary: 241 mg/dL — ABNORMAL HIGH (ref 70–99)
Glucose-Capillary: 325 mg/dL — ABNORMAL HIGH (ref 70–99)
Glucose-Capillary: 351 mg/dL — ABNORMAL HIGH (ref 70–99)
Glucose-Capillary: 277 mg/dL — ABNORMAL HIGH (ref 70–99)

## 2019-12-26 LAB — FERRITIN: Ferritin: 181 ng/mL (ref 11–307)

## 2019-12-26 LAB — MAGNESIUM: Magnesium: 2.5 mg/dL — ABNORMAL HIGH (ref 1.7–2.4)

## 2019-12-26 LAB — D-DIMER, QUANTITATIVE: D-Dimer, Quant: 0.81 ug/mL-FEU — ABNORMAL HIGH (ref 0.00–0.50)

## 2019-12-26 LAB — C-REACTIVE PROTEIN: CRP: 3.7 mg/dL — ABNORMAL HIGH (ref <1.0)

## 2019-12-26 LAB — PROCALCITONIN: Procalcitonin: 0.41 ng/mL

## 2019-12-26 LAB — BRAIN NATRIURETIC PEPTIDE: B Natriuretic Peptide: 211.4 pg/mL — ABNORMAL HIGH (ref 0.0–100.0)

## 2019-12-26 MED ORDER — LACTATED RINGERS IV SOLN: INTRAVENOUS | Status: DC

## 2019-12-26 MED ORDER — INSULIN GLARGINE 100 UNIT/ML ~~LOC~~ SOLN
66.0000 [IU] | Freq: Every day | SUBCUTANEOUS | Status: DC
  Administered 2019-12-27: 66 [IU] via SUBCUTANEOUS
  Filled 2019-12-26: qty 0.66

## 2019-12-26 MED ORDER — LACTATED RINGERS IV BOLUS
500.0000 mL | Freq: Once | INTRAVENOUS | Status: AC
  Administered 2019-12-26: 500 mL via INTRAVENOUS

## 2019-12-26 MED ORDER — INSULIN ASPART 100 UNIT/ML ~~LOC~~ SOLN
8.0000 [IU] | Freq: Three times a day (TID) | SUBCUTANEOUS | Status: DC
  Administered 2019-12-26 – 2019-12-27 (×3): 8 [IU] via SUBCUTANEOUS

## 2019-12-26 NOTE — Care Management Important Message (Signed)
Important Message  Patient Details IM Letter given to the Patient Name: Kathryn Richardson MRN: [id number] Date of Birth: Apr 03, 1935   Medicare Important Message Given:  Yes     Kerin Salen 12/26/2019, 10:20 AM

## 2019-12-26 NOTE — Progress Notes (Signed)
PROGRESS NOTE    [name]  QBH:[id number] DOB: [birth year] DOA: 12/20/2019 PCP: [name], MD   Chief Complaint  Patient presents with  . Covid positive    Brief Narrative:   Kathryn Richardson  is Kathryn Richardson 85 y.o. female, with history of type 2 diabetes mellitus, hyperlipidemia, GERD, hypertension, chronic renal disease stage IV, atrial fibrillation, and more presents to the ED with [name] chief complaint of difficulty breathing.  Patient reports symptoms started 9 days ago.  She had Covid test 8 days ago and it was positive.  She reports that she had monoclonal antibody infusion on 10 August.  She has had progressively worsening shortness of breath over these 8 days.  Her oxygen level was low today, so she came into the ER.  She reports that she had Kathryn Richardson cough productive of off-white phlegm at the onset of her symptoms, but the cough is mostly improved.  She also reports that she had body aches that have subsided.  She was having diaphoresis but that has also subsided.  Patient reports decrease in appetite, but no nausea vomiting.  She reports that she did have diarrhea for several days at the onset of symptoms.  She denies chest pains but admits to palpitations especially with movement.  Palpitations are not all that are normal for her as she does have [name] history of Kathryn Richardson. fib.  Patient is fully vaccinated for Covid and had her second shot in February.  In the ED Temperature 97.7, blood pressure 164/68, heart rate 102, respiratory rate 28, satting 97% on 6 L nasal cannula VBG shows Merlon Alcorta pH of 7.5, PCO2 33.8-respiratory alkalosis White blood cell count 9.8 CHEM panel shows mild hyponatremia at 132 that corrects for hyperglycemia of 331.  Elevated creatinine at 1.76 that is at her baseline  inflammatory markers reveal D-dimer 0.72, fibrinogen 728, LDH 371, triglycerides 109, ferritin 315,, lactic acid 2.1, pro-Cal 0.75 Patient was started on remdesivir, Decadron, albuterol Transfer from [place]:   Active Problems:   Acute respiratory failure with hypoxia (HCC)   Pneumonia due to COVID-19 virus   1. Acute hypoxic respiratory failure 2/2 COVID 19 Pneumonia 1. Vaccinated (2nd dose in February) 2. CXR with patchy bilateral airspace opacities 3. Now on 5 L [place], increased from yesterday, but overall looks stable 4. S/p regeneron on 8/10 5. Solumedrol, remdesivir 6. Consider baricitinib if worsening 7. Procalcitonin elevated, trend - trending down -> continue empiric abx  8. I/O, daily weights 9. Prone as able, OOB, IS, flutter 10. Follow cultures (NGTD x3 days)  COVID-19 Labs  Recent Labs    12/24/19 0502 12/25/19 0507 12/25/19 0832 12/26/19 0448  DDIMER 0.81* 0.86*  --  0.81*  FERRITIN 301  --  224 181  CRP 11.3*  --  5.3* 3.7*    Lab Results  Component Value Date   SARSCOV2NAA POSITIVE (Kathryn Richardson) 01/01/2020   [place] NEGATIVE 08/13/2019   2. Acute Kidney Injury on CKD IV: creatinine bumped today after receiving home dose of lasix yesterday.  Hold additional lasix.  Stop benazepril.  1. Follow with IVF 2. Renal US without hydro     3. Lactic acidosis 1. Mild, follow clinically  4. Hyperglycemia in the setting of DMII 1. Continue lantus, add mealtime, resistant slide, tradjenta - adjust   5. Elevated BNP  HFpEF: Echo with EF 50-55%, LV hypertrophy.  Mildly reduced systolic function (see report). 1. Hold lasix  6. Respitatory Alkalosis 1. Clinically improving,  no tachypnea  7. PAF 1. Continue diltiazem, beta blocker, and eliquis 2. Monitor on tele  DVT prophylaxis: eliquis Code Status: full  Family Communication: daughters 8/15 Disposition:   Status is: Inpatient  Remains inpatient appropriate because:Inpatient level of care appropriate due to severity of illness   Dispo: The patient is from: Home              Anticipated d/c is to: pending              Anticipated d/c date is: > 3 days              Patient currently is  not medically stable to d/c.   Consultants:   none  Procedures:  none   Antimicrobials:  Anti-infectives (From admission, onward)   Start     Dose/Rate Route Frequency Ordered Stop   12/24/19 1000  cefTRIAXone (ROCEPHIN) 2 g in sodium chloride 0.9 % 100 mL IVPB     Discontinue     2 g 200 mL/hr over 30 Minutes Intravenous Every 24 hours 12/24/19 0843 12/29/19 0959   12/24/19 1000  azithromycin (ZITHROMAX) 500 mg in sodium chloride 0.9 % 250 mL IVPB     Discontinue     500 mg 250 mL/hr over 60 Minutes Intravenous Every 24 hours 12/24/19 0843 12/29/19 0959   12/23/19 1000  remdesivir 100 mg in sodium chloride 0.9 % 100 mL IVPB       "Followed by" Linked Group Details   100 mg 200 mL/hr over 30 Minutes Intravenous Daily 01/09/2020 2317 12/26/19 0842   12/23/19 0001  remdesivir 100 mg in sodium chloride 0.9 % 100 mL IVPB       "Followed by" Linked Group Details   100 mg 200 mL/hr over 30 Minutes Intravenous  Once 01/06/2020 2317 12/23/19 0224   12/24/2019 2330  remdesivir 100 mg in sodium chloride 0.9 % 100 mL IVPB       "Followed by" Linked Group Details   100 mg 200 mL/hr over 30 Minutes Intravenous  Once 12/12/2019 2317 12/23/19 0052      Subjective: No new complaints, feels about the same  Objective: Vitals:   12/26/19 1100 12/26/19 1114 12/26/19 1311 12/26/19 1341  BP:   (!) 88/49   Pulse:   67   Resp:   20   Temp:   98 F (36.7 C)   TempSrc:   Oral   SpO2:  (!) 89% (!) 89% 92%  Weight: 95 kg       Intake/Output Summary (Last 24 hours) at 12/26/2019 1622 Last data filed at 12/26/2019 0600 Gross per 24 hour  Intake 100 ml  Output 150 ml  Net -50 ml   Filed Weights   12/26/19 1100  Weight: 95 kg    Examination:  General: No acute distress. Cardiovascular: Heart sounds show Kathryn Richardson regular rate, and rhythm. Lungs: Clear to auscultation bilaterally Abdomen: Soft, nontender, nondistended Neurological: Alert and oriented 3. Moves all extremities 4 . Cranial nerves II  through XII grossly intact. Skin: Warm and dry. No rashes or lesions. Extremities: No clubbing or cyanosis. No edema.   Data Reviewed: I have personally reviewed following labs and imaging studies  CBC: Recent Labs  Lab 12/21/2019 2207 12/29/2019 2247 12/24/19 0502 12/25/19 0507 12/26/19 0448  WBC 9.8  --  14.9* 14.1* 14.3*  NEUTROABS 8.6*  --  13.0* 12.8* 13.1*  HGB 14.8 14.6 14.6 13.5 13.2  HCT 43.6 43.0 42.4 41.0 38.5  MCV 92.2  --  92.6 97.4 93.4  PLT 181  --  267 257 701    Basic Metabolic Panel: Recent Labs  Lab 12/31/2019 2207 12/16/2019 2207 12/31/2019 2247 12/24/19 0502 12/24/19 1655 12/25/19 0832 12/26/19 0448  NA 132*   < > 134* 128* 132* 133* 131*  K 3.6   < > 3.4* 3.5 4.1 4.0 3.9  CL 97*  --   --  97* 100 99 100  CO2 23  --   --  21* 19* 21* 19*  GLUCOSE 331*  --   --  163* 398* 272* 242*  BUN 32*  --   --  54* 66* 75* 97*  CREATININE 1.76*  --   --  1.91* 2.06* 2.25* 3.13*  CALCIUM 7.9*  --   --  7.3* 7.6* 7.5* 7.5*  MG  --   --   --  2.5*  --  2.5* 2.5*   < > = values in this interval not displayed.    GFR: Estimated Creatinine Clearance: 14.9 mL/min ([place]) (by C-G formula based on SCr of 3.13 mg/dL (H)).  Liver Function Tests: Recent Labs  Lab 12/19/2019 2207 12/24/19 0502 12/24/19 1655 12/25/19 0832 12/26/19 0448  AST 26 19 20 18  14*  ALT 20 18 19 18 17   ALKPHOS 47 41 48 35* 38  BILITOT 1.0 0.7 0.9 1.2 0.5  PROT 6.4* 5.8* 5.9* 5.1* 4.9*  ALBUMIN 2.8* 2.5* 2.5* 2.4* 2.0*    CBG: Recent Labs  Lab 12/25/19 1711 12/25/19 2054 12/26/19 0750 12/26/19 1231 12/26/19 1608  GLUCAP 337* 379* 241* 325* 351*     Recent Results (from the past 240 hour(s))  SARS Coronavirus 2 by RT PCR (hospital order, performed in [place] hospital lab) Nasopharyngeal Nasopharyngeal Swab     Status: Abnormal   Collection Time: 12/24/2019 10:07 PM   Specimen: Nasopharyngeal Swab  Result Value Ref Range Status   SARS Coronavirus 2 POSITIVE (Amanii Snethen) NEGATIVE Final     Comment: RESULT CALLED TO, READ BACK BY AND VERIFIED WITH: LISA ADKINS RN AT 2315 ON 12/31/2019 BY I.SUGUT (NOTE) SARS-CoV-2 target nucleic acids are DETECTED  SARS-CoV-2 RNA is generally detectable in upper respiratory specimens  during the acute phase of infection.  Positive results are indicative  of the presence of the identified virus, but do not rule out bacterial infection or co-infection with other pathogens not detected by the test.  Clinical correlation with patient history and  other diagnostic information is necessary to determine patient infection status.  The expected result is negative.  Fact Sheet for Patients:   StrictlyIdeas.no   Fact Sheet for Healthcare Providers:   BankingDealers.co.za    This test is not yet approved or cleared by the [place] FDA and  has been authorized for detection and/or diagnosis of SARS-CoV-2 by FDA under an Emergency Use Authorization (EUA).  This EUA will remain in effect (mea ning this test can be used) for the duration of  the COVID-19 declaration under Section 564(b)(1) of the Act, 21 U.S.C. section 360-bbb-3(b)(1), unless the authorization is terminated or revoked sooner.  Performed at [facility], [address] [place], [place] [address]   Blood Culture (routine x 2)     Status: None (Preliminary result)   Collection Time: 01/10/2020 10:07 PM   Specimen: BLOOD  Result Value Ref Range Status   Specimen Description   Final    BLOOD BLOOD RIGHT HAND Performed at [facility], [address] [place],  [place] [address]    Special Requests   Final    BOTTLES DRAWN AEROBIC AND ANAEROBIC Blood Culture adequate volume Performed at [facility], [address] [place], [place] [address]    Culture   Final    NO GROWTH 3 DAYS Performed at [facility], [place] [address], [place], [place] [address]    Report Status PENDING  Incomplete  Blood  Culture (routine x 2)     Status: None (Preliminary result)   Collection Time: 12/21/2019 10:07 PM   Specimen: BLOOD  Result Value Ref Range Status   Specimen Description   Final    BLOOD LEFT ANTECUBITAL Performed at [facility], [address] [place], [place] [address]    Special Requests   Final    BOTTLES DRAWN AEROBIC AND ANAEROBIC Blood Culture adequate volume Performed at [facility], [address] [place], [place] [address]    Culture   Final    NO GROWTH 3 DAYS Performed at [facility], [place] [address] [place], [place] [address]    Report Status PENDING  Incomplete         Radiology Studies: US RENAL  Result Date: 12/26/2019 CLINICAL DATA:  Acute kidney injury, stage III chronic kidney disease, COVID-19, hypertension, diabetes mellitus EXAM: RENAL / URINARY TRACT ULTRASOUND COMPLETE COMPARISON:  Abdominal ultrasound 01/28/2017 FINDINGS: Right Kidney: Renal measurements: 10.1 x 4.1 x 4.8 cm = volume: 104 mL. Mild cortical thinning. Upper normal cortical echogenicity. No mass or hydronephrosis. Left Kidney: Renal measurements: 8.3 x 4.3 x 4.8 cm = volume: 89 mL. Cortical thinning. Increased cortical echogenicity. No mass, hydronephrosis or shadowing calcification. Bladder: Appears normal for degree of bladder distention. Other: N/Shakura Cowing IMPRESSION: Cortical thinning and probable medical renal disease changes of both kidneys. No evidence of renal mass or hydronephrosis. Electronically Signed   By: Lavonia Dana M.D.   On: 12/26/2019 11:58   ECHOCARDIOGRAM COMPLETE  Result Date: 12/25/2019    ECHOCARDIOGRAM REPORT   Patient Name:   NAFEESAH LAPAGLIA Diskin Date of Exam: 12/25/2019 Medical Rec #:  [id number]      Height:       63.0 in Accession #:    [id number]     Weight:       197.4 lb Date of Birth:  03-29-1935      BSA:          1.923 m Patient Age:    10 years       BP:           108/58 mmHg Patient Gender: F              HR:           70 bpm. Exam Location:   Inpatient Procedure: 2D Echo and Strain Analysis Indications:    Elevated brain natriuretic peptide (BNP) level [[id number]]  History:        Patient has prior history of Echocardiogram examinations, most                 recent 04/22/2016. Pacemaker, Arrythmias:Atrial Fibrillation;                 Risk Factors:Hypertension, Diabetes and Dyslipidemia. Chronic                 renal disease stage IV, Covid Pneumonia, Acute hypoxic                 respiratory  failure.  Sonographer:    Darlina Sicilian RDCS Referring Phys: [phone] Davinia Riccardi CALDWELL POWELL [place]  1. Left ventricular ejection fraction, by estimation, is 50 to 55%. The left ventricle has low normal function. The left ventricle has no regional wall motion abnormalities. There is mild concentric left ventricular hypertrophy. Left ventricular diastolic function could not be evaluated. The average left ventricular global longitudinal strain is -7.1 %.  2. Right ventricular systolic function is mildly reduced. The right ventricular size is mildly enlarged. There is normal pulmonary artery systolic pressure. The estimated right ventricular systolic pressure is 30.1 mmHg.  3. Left atrial size was mildly dilated.  4. The mitral valve is degenerative. Mild mitral valve regurgitation. No evidence of mitral stenosis.  5. The aortic valve is tricuspid. Aortic valve regurgitation is not visualized. No aortic stenosis is present.  6. The inferior vena cava is normal in size with greater than 50% respiratory variability, suggesting right atrial pressure of 3 mmHg. Comparison(s): Prior images unable to be directly viewed, comparison made by report only. No significant change from prior study. FINDINGS  Left Ventricle: Left ventricular ejection fraction, by estimation, is 50 to 55%. The left ventricle has low normal function. The left ventricle has no regional wall motion abnormalities. The average left ventricular global longitudinal strain is -7.1 %.  The left ventricular  internal cavity size was normal in size. There is mild concentric left ventricular hypertrophy. Left ventricular diastolic function could not be evaluated due to atrial fibrillation. Left ventricular diastolic function could not be evaluated. Right Ventricle: The right ventricular size is mildly enlarged. No increase in right ventricular wall thickness. Right ventricular systolic function is mildly reduced. There is normal pulmonary artery systolic pressure. The tricuspid regurgitant velocity  is 2.27 m/s, and with an assumed right atrial pressure of 3 mmHg, the estimated right ventricular systolic pressure is 60.1 mmHg. Left Atrium: Left atrial size was mildly dilated. Right Atrium: Right atrial size was normal in size. Pericardium: Trivial pericardial effusion is present. Mitral Valve: The mitral valve is degenerative in appearance. Mild mitral annular calcification. Mild mitral valve regurgitation. No evidence of mitral valve stenosis. Tricuspid Valve: The tricuspid valve is grossly normal. Tricuspid valve regurgitation is mild . No evidence of tricuspid stenosis. Aortic Valve: The aortic valve is tricuspid. Aortic valve regurgitation is not visualized. No aortic stenosis is present. Pulmonic Valve: The pulmonic valve was grossly normal. Pulmonic valve regurgitation is mild. No evidence of pulmonic stenosis. Aorta: The aortic root and ascending aorta are structurally normal, with no evidence of dilitation. Venous: The inferior vena cava is normal in size with greater than 50% respiratory variability, suggesting right atrial pressure of 3 mmHg. IAS/Shunts: The atrial septum is grossly normal. Additional Comments: Rudolf Blizard pacer wire is visualized in the right atrium and right ventricle.   LV Volumes (MOD) LV vol d, MOD A2C: 88.6 ml LV vol d, MOD A4C: 85.2 ml 2D Longitudinal Strain LV vol s, MOD A2C: 47.3 ml 2D Strain GLS Avg:     -7.1 % LV vol s, MOD A4C: 44.7 ml LV SV MOD A2C:     41.3 ml LV SV MOD A4C:     85.2 ml LV  SV MOD BP:      41.7 ml RIGHT ATRIUM           Index RA Area:     15.30 cm RA Volume:   38.00 ml  19.76 ml/m  TRICUSPID VALVE TR Peak grad:   20.6 mmHg TR  Vmax:        227.00 cm/s Eleonore Chiquito MD Electronically signed by Eleonore Chiquito MD Signature Date/Time: 12/25/2019/10:32:44 AM    Final         Scheduled Meds: . apixaban  2.5 mg Oral BID  . atorvastatin  80 mg Oral QHS  . benazepril  20 mg Oral Daily  . diltiazem  360 mg Oral Daily  . insulin aspart  0-20 Units Subcutaneous TID WC  . insulin aspart  0-5 Units Subcutaneous QHS  . insulin aspart  8 Units Subcutaneous TID WC  . [START ON 12/27/2019] insulin glargine  66 Units Subcutaneous Daily  . levothyroxine  50 mcg Oral Daily  . linagliptin  5 mg Oral Daily  . methylPREDNISolone (SOLU-MEDROL) injection  45 mg Intravenous Q12H  . metoprolol succinate  25 mg Oral Daily   Continuous Infusions: . sodium chloride 10 mL/hr at 01/01/2020 2356  . azithromycin 500 mg (12/26/19 0818)  . cefTRIAXone (ROCEPHIN)  IV 2 g (12/26/19 1000)  . lactated ringers 100 mL/hr at 12/26/19 1329     LOS: 3 days    Time spent: over 30 min    Fayrene Helper, MD [facility]   To contact the attending provider between 7A-7P or the covering provider during after hours 7P-7A, please log into the web site [email or web address] and access using universal [place] password for that web site. If you do not have the password, please call the hospital operator.  12/26/2019, 4:22 PM

## 2019-12-26 NOTE — Progress Notes (Addendum)
Urine appear slightly darker and less since last evening.  Concern for remdesivir and IV antibiotics on kidney function as UO has decreased and bp is lower. Pt states bp usually SBP120, currently 90. Today at 10am will be last remdesivir and antibiotics end on the 19th.

## 2019-12-27 LAB — URINALYSIS, ROUTINE W REFLEX MICROSCOPIC
Bilirubin Urine: NEGATIVE
Glucose, UA: NEGATIVE mg/dL
Hgb urine dipstick: NEGATIVE
Ketones, ur: NEGATIVE mg/dL
Nitrite: NEGATIVE
Protein, ur: NEGATIVE mg/dL
Specific Gravity, Urine: 1.017 (ref 1.005–1.030)
pH: 5 (ref 5.0–8.0)

## 2019-12-27 LAB — COMPREHENSIVE METABOLIC PANEL
ALT: 16 U/L (ref 0–44)
AST: 22 U/L (ref 15–41)
Albumin: 2 g/dL — ABNORMAL LOW (ref 3.5–5.0)
Alkaline Phosphatase: 47 U/L (ref 38–126)
Anion gap: 12 (ref 5–15)
BUN: 93 mg/dL — ABNORMAL HIGH (ref 8–23)
CO2: 17 mmol/L — ABNORMAL LOW (ref 22–32)
Calcium: 7.2 mg/dL — ABNORMAL LOW (ref 8.9–10.3)
Chloride: 99 mmol/L (ref 98–111)
Creatinine, Ser: 3.6 mg/dL — ABNORMAL HIGH (ref 0.44–1.00)
GFR calc Af Amer: 13 mL/min — ABNORMAL LOW (ref >60)
GFR calc non Af Amer: 11 mL/min — ABNORMAL LOW (ref >60)
Glucose, Bld: 252 mg/dL — ABNORMAL HIGH (ref 70–99)
Potassium: 4.4 mmol/L (ref 3.5–5.1)
Sodium: 128 mmol/L — ABNORMAL LOW (ref 135–145)
Total Bilirubin: 0.8 mg/dL (ref 0.3–1.2)
Total Protein: 4.8 g/dL — ABNORMAL LOW (ref 6.5–8.1)

## 2019-12-27 LAB — CBC WITH DIFFERENTIAL/PLATELET
Abs Immature Granulocytes: 0.22 10*3/uL — ABNORMAL HIGH (ref 0.00–0.07)
Basophils Absolute: 0 10*3/uL (ref 0.0–0.1)
Basophils Relative: 0 %
Eosinophils Absolute: 0 10*3/uL (ref 0.0–0.5)
Eosinophils Relative: 0 %
HCT: 38.5 % (ref 36.0–46.0)
Hemoglobin: 13.1 g/dL (ref 12.0–15.0)
Immature Granulocytes: 2 %
Lymphocytes Relative: 2 %
Lymphs Abs: 0.3 10*3/uL — ABNORMAL LOW (ref 0.7–4.0)
MCH: 31.9 pg (ref 26.0–34.0)
MCHC: 34 g/dL (ref 30.0–36.0)
MCV: 93.7 fL (ref 80.0–100.0)
Monocytes Absolute: 0.6 10*3/uL (ref 0.1–1.0)
Monocytes Relative: 4 %
Neutro Abs: 12.4 10*3/uL — ABNORMAL HIGH (ref 1.7–7.7)
Neutrophils Relative %: 92 %
Platelets: 318 10*3/uL (ref 150–400)
RBC: 4.11 MIL/uL (ref 3.87–5.11)
RDW: 15.2 % (ref 11.5–15.5)
WBC: 13.6 10*3/uL — ABNORMAL HIGH (ref 4.0–10.5)
nRBC: 0 % (ref 0.0–0.2)

## 2019-12-27 LAB — BASIC METABOLIC PANEL
Anion gap: 16 — ABNORMAL HIGH (ref 5–15)
BUN: 116 mg/dL — ABNORMAL HIGH (ref 8–23)
CO2: 18 mmol/L — ABNORMAL LOW (ref 22–32)
Calcium: 7.6 mg/dL — ABNORMAL LOW (ref 8.9–10.3)
Chloride: 98 mmol/L (ref 98–111)
Creatinine, Ser: 3.78 mg/dL — ABNORMAL HIGH (ref 0.44–1.00)
GFR calc Af Amer: 12 mL/min — ABNORMAL LOW (ref >60)
GFR calc non Af Amer: 10 mL/min — ABNORMAL LOW (ref >60)
Glucose, Bld: 316 mg/dL — ABNORMAL HIGH (ref 70–99)
Potassium: 3.9 mmol/L (ref 3.5–5.1)
Sodium: 132 mmol/L — ABNORMAL LOW (ref 135–145)

## 2019-12-27 LAB — CREATININE, URINE, RANDOM: Creatinine, Urine: 115.46 mg/dL

## 2019-12-27 LAB — SODIUM, URINE, RANDOM: Sodium, Ur: <10 mmol/L

## 2019-12-27 LAB — FERRITIN: Ferritin: 186 ng/mL (ref 11–307)

## 2019-12-27 LAB — GLUCOSE, CAPILLARY
Glucose-Capillary: 284 mg/dL — ABNORMAL HIGH (ref 70–99)
Glucose-Capillary: 362 mg/dL — ABNORMAL HIGH (ref 70–99)
Glucose-Capillary: 288 mg/dL — ABNORMAL HIGH (ref 70–99)
Glucose-Capillary: 292 mg/dL — ABNORMAL HIGH (ref 70–99)

## 2019-12-27 LAB — D-DIMER, QUANTITATIVE: D-Dimer, Quant: 0.93 ug/mL-FEU — ABNORMAL HIGH (ref 0.00–0.50)

## 2019-12-27 LAB — MAGNESIUM: Magnesium: 2.5 mg/dL — ABNORMAL HIGH (ref 1.7–2.4)

## 2019-12-27 LAB — C-REACTIVE PROTEIN: CRP: 2.4 mg/dL — ABNORMAL HIGH (ref <1.0)

## 2019-12-27 MED ORDER — INSULIN ASPART 100 UNIT/ML ~~LOC~~ SOLN: 12.0000 [IU] | Freq: Three times a day (TID) | SUBCUTANEOUS | Status: DC

## 2019-12-27 MED ORDER — INSULIN GLARGINE 100 UNIT/ML ~~LOC~~ SOLN
70.0000 [IU] | Freq: Every day | SUBCUTANEOUS | Status: DC
  Administered 2019-12-28 – 2020-01-02 (×6): 70 [IU] via SUBCUTANEOUS
  Filled 2019-12-27 (×6): qty 0.7

## 2019-12-27 MED ORDER — SODIUM BICARBONATE 650 MG PO TABS
650.0000 mg | ORAL_TABLET | Freq: Three times a day (TID) | ORAL | Status: DC
  Administered 2019-12-27 – 2020-01-08 (×34): 650 mg via ORAL
  Filled 2019-12-27 (×34): qty 1

## 2019-12-27 MED ORDER — INSULIN ASPART 100 UNIT/ML ~~LOC~~ SOLN
14.0000 [IU] | Freq: Three times a day (TID) | SUBCUTANEOUS | Status: DC
  Administered 2019-12-27 – 2020-01-01 (×13): 14 [IU] via SUBCUTANEOUS

## 2019-12-27 NOTE — Progress Notes (Signed)
Physical Therapy Treatment Patient Details Name: Kathryn Richardson MRN: [id number] DOB: Apr 13, 1935 Today's Date: 12/27/2019    History of Present Illness 85 yo female admitted with COVID. hx of pacemaker, afib, dm, gout, ckd    PT Comments    Pt requiring 3-5 minute rest breaks between activities due to dyspnea and fatigue.  Pt performed 2 exercises sitting EOB and then 2 sit to stands.  Pt fatigued very quickly.  SpO2 mostly 83% on 5L HFNC, dropping to lowest 71% with second sit to stand.  Pt again requiring a few minutes between activities to recover.  SpO2 89% on 5L HFNC upon leaving room (RN aware).   Follow Up Recommendations  Home health PT;Supervision/Assistance - 24 hour     Equipment Recommendations  Rolling walker with 5" wheels    Recommendations for Other Services       Precautions / Restrictions Precautions Precautions: Fall Precaution Comments: monitor O2    Mobility  Bed Mobility Overal bed mobility: Needs Assistance Bed Mobility: Supine to Sit;Sit to Supine     Supine to sit: Supervision Sit to supine: Supervision   General bed mobility comments: increased time and effort  Transfers Overall transfer level: Needs assistance Equipment used: 1 person hand held assist Transfers: Sit to/from Stand Sit to Stand: Min assist         General transfer comment: verbal cues for hand placement, declined RW so provided HHA for more support; performed x2 and pt only able to stand approx 30 sec each time  Ambulation/Gait                 Stairs             Wheelchair Mobility    Modified Rankin (Stroke Patients Only)       Balance                                            Cognition Arousal/Alertness: Awake/alert Behavior During Therapy: WFL for tasks assessed/performed Overall Cognitive Status: Within Functional Limits for tasks assessed                                        Exercises General  Exercises - Lower Extremity Long Arc Quad: AROM;Both;10 reps;Seated Hip Flexion/Marching: AROM;Both;10 reps;Seated    General Comments        Pertinent Vitals/Pain Pain Assessment: No/denies pain    Home Living                      Prior Function            PT Goals (current goals can now be found in the care plan section) Progress towards PT goals: Progressing toward goals    Frequency    Min 3X/week      PT Plan Current plan remains appropriate    Co-evaluation              AM-PAC PT "6 Clicks" Mobility   Outcome Measure  Help needed turning from your back to your side while in a flat bed without using bedrails?: A Little Help needed moving from lying on your back to sitting on the side of a flat bed without using bedrails?: A Little Help needed moving to and from a bed to  a chair (including a wheelchair)?: A Little Help needed standing up from a chair using your arms (e.g., wheelchair or bedside chair)?: A Little Help needed to walk in hospital room?: A Little Help needed climbing 3-5 steps with a railing? : A Lot 6 Click Score: 17    End of Session Equipment Utilized During Treatment: Gait belt;Oxygen Activity Tolerance: Patient limited by fatigue Patient left: in bed;with call bell/phone within reach;with bed alarm set Nurse Communication: Mobility status PT Visit Diagnosis: Muscle weakness (generalized) (M62.81);Difficulty in walking, not elsewhere classified (R26.2)     Time: 4461-9012 PT Time Calculation (min) (ACUTE ONLY): 26 min  Charges:  $Therapeutic Exercise: 8-22 mins $Therapeutic Activity: 8-22 mins                    Jannette Spanner PT, DPT Acute Rehabilitation Services Pager: [phone] Office: [phone]  York Ram E 12/27/2019, 3:25 PM

## 2019-12-27 NOTE — Progress Notes (Addendum)
PROGRESS NOTE    Kathryn Richardson  XNT:[id number] DOB: [birth year] DOA: 01/10/2020 PCP: [name], MD   Chief Complaint  Patient presents with  . Covid positive    Brief Narrative:   Kathryn Richardson  is Kathryn Richardson 85 y.o. female, with history of type 2 diabetes mellitus, hyperlipidemia, GERD, hypertension, chronic renal disease stage IV, atrial fibrillation, and more presents to the ED with Kathryn Richardson chief complaint of difficulty breathing.  Patient reports symptoms started 9 days ago.  She had Covid test 8 days ago and it was positive.  She reports that she had monoclonal antibody infusion on 10 August.  She has had progressively worsening shortness of breath over these 8 days.  Her oxygen level was low today, so she came into the ER.  She reports that she had Kathryn Richardson cough productive of off-white phlegm at the onset of her symptoms, but the cough is mostly improved.  She also reports that she had body aches that have subsided.  She was having diaphoresis but that has also subsided.  Patient reports decrease in appetite, but no nausea vomiting.  She reports that she did have diarrhea for several days at the onset of symptoms.  She denies chest pains but admits to palpitations especially with movement.  Palpitations are not all that are normal for her as she does have Kathryn Richardson history of Kathryn Richardson. fib.  Patient is fully vaccinated for Covid and had her second shot in February.  In the ED Temperature 97.7, blood pressure 164/68, heart rate 102, respiratory rate 28, satting 97% on 6 L nasal cannula VBG shows Kathryn Richardson pH of 7.5, PCO2 33.8-respiratory alkalosis White blood cell count 9.8 CHEM panel shows mild hyponatremia at 132 that corrects for hyperglycemia of 331.  Elevated creatinine at 1.76 that is at her baseline  inflammatory markers reveal D-dimer 0.72, fibrinogen 728, LDH 371, triglycerides 109, ferritin 315,, lactic acid 2.1, pro-Cal 0.75 Patient was started on remdesivir, Decadron, albuterol Transfer from [place]:   Active Problems:   AKI (acute kidney injury) ([place])   Acute respiratory failure with hypoxia (HCC)   Pneumonia due to COVID-19 virus   1. Acute hypoxic respiratory failure 2/2 COVID 19 Pneumonia 1. Vaccinated (2nd dose in February) 2. CXR with patchy bilateral airspace opacities 3. Stable on 5 L Kathryn Richardson 4. S/p regeneron on 8/10 5. Solumedrol, remdesivir 6. Consider baricitinib if worsening 7. Procalcitonin elevated, trend - trending down -> continue empiric abx  8. I/O, daily weights 9. Prone as able, OOB, IS, flutter 10. Follow cultures (NGTD x4 days)  COVID-19 Labs  Recent Labs    12/25/19 0507 12/25/19 0832 12/26/19 0448 12/27/19 0441  DDIMER 0.86*  --  0.81* 0.93*  FERRITIN  --  224 181 186  CRP  --  5.3* 3.7* 2.4*    Lab Results  Component Value Date   SARSCOV2NAA POSITIVE (Kathryn Richardson) 12/24/2019   [place] NEGATIVE 08/13/2019   2. Acute Kidney Injury on CKD IV  NAGMA:   Baseline creatinine 1.7-2.2.  Creatinine at baseline at presentation, has worsened since admission.  Received lasix x1, was also on benazepril.  Hold lasix.  Stop benazepril.   1. Creatinine continues to worsen today 2. UA is bland (negative protein, 0-5 RBC), urine sodium is <10 suggesting sodium avid state, continue IVF (does not appear overloaded at this time) 3. Continue IVF  4. Bicarb for NAGMA 5. Renal US without hydro     3. Lactic acidosis 1. Mild,  follow clinically  4. Hyperglycemia in the setting of DMII 1. Continue lantus, add mealtime, resistant slide, tradjenta - adjust  5. Elevated BNP  HFpEF: Echo with EF 50-55%, LV hypertrophy.  Mildly reduced systolic function (see report). 1. Hold lasix - does not appear overloaded at this time  6. Respitatory Alkalosis 1. Clinically improving, no tachypnea  7. PAF 1. Continue diltiazem, beta blocker, and eliquis 2. Monitor on tele  DVT prophylaxis: eliquis Code Status: full  Family Communication: daughters  8/15, 8/17 Disposition:   Status is: Inpatient  Remains inpatient appropriate because:Inpatient level of care appropriate due to severity of illness   Dispo: The patient is from: Home              Anticipated d/c is to: pending              Anticipated d/c date is: > 3 days              Patient currently is not medically stable to d/c.   Consultants:   none  Procedures:  Echo IMPRESSIONS    1. Left ventricular ejection fraction, by estimation, is 50 to 55%. The  left ventricle has low normal function. The left ventricle has no regional  wall motion abnormalities. There is mild concentric left ventricular  hypertrophy. Left ventricular  diastolic function could not be evaluated. The average left ventricular  global longitudinal strain is -7.1 %.  2. Right ventricular systolic function is mildly reduced. The right  ventricular size is mildly enlarged. There is normal pulmonary artery  systolic pressure. The estimated right ventricular systolic pressure is  10.2 mmHg.  3. Left atrial size was mildly dilated.  4. The mitral valve is degenerative. Mild mitral valve regurgitation. No  evidence of mitral stenosis.  5. The aortic valve is tricuspid. Aortic valve regurgitation is not  visualized. No aortic stenosis is present.  6. The inferior vena cava is normal in size with greater than 50%  respiratory variability, suggesting right atrial pressure of 3 mmHg.   Comparison(s): Prior images unable to be directly viewed, comparison made  by report only. No significant change from prior study.    Antimicrobials:  Anti-infectives (From admission, onward)   Start     Dose/Rate Route Frequency Ordered Stop   12/24/19 1000  cefTRIAXone (ROCEPHIN) 2 g in sodium chloride 0.9 % 100 mL IVPB     Discontinue     2 g 200 mL/hr over 30 Minutes Intravenous Every 24 hours 12/24/19 0843 12/29/19 0959   12/24/19 1000  azithromycin (ZITHROMAX) 500 mg in sodium chloride 0.9 % 250 mL IVPB      Discontinue     500 mg 250 mL/hr over 60 Minutes Intravenous Every 24 hours 12/24/19 0843 12/29/19 0959   12/23/19 1000  remdesivir 100 mg in sodium chloride 0.9 % 100 mL IVPB       "Followed by" Linked Group Details   100 mg 200 mL/hr over 30 Minutes Intravenous Daily 12/24/2019 2317 12/26/19 0842   12/23/19 0001  remdesivir 100 mg in sodium chloride 0.9 % 100 mL IVPB       "Followed by" Linked Group Details   100 mg 200 mL/hr over 30 Minutes Intravenous  Once 01/05/2020 2317 12/23/19 0224   12/21/2019 2330  remdesivir 100 mg in sodium chloride 0.9 % 100 mL IVPB       "Followed by" Linked Group Details   100 mg 200 mL/hr over 30 Minutes Intravenous  Once 12/26/2019 2317  12/23/19 0052      Subjective: No new complaints  Objective: Vitals:   12/26/19 2300 12/27/19 0549 12/27/19 0800 12/27/19 1355  BP:  112/60  (!) 117/51  Pulse:  61  67  Resp:    18  Temp:  (!) 97.4 F (36.3 C)  (!) 97.4 F (36.3 C)  TempSrc:  Oral  Oral  SpO2:  92% 98% (!) 88%  Weight: 95.3 kg 95 kg    Height: 5\' 3"  (1.6 m)       Intake/Output Summary (Last 24 hours) at 12/27/2019 1530 Last data filed at 12/27/2019 1500 Gross per 24 hour  Intake 3990.97 ml  Output 450 ml  Net 3540.97 ml   Filed Weights   12/26/19 1100 12/26/19 2300 12/27/19 0549  Weight: 95 kg 95.3 kg 95 kg    Examination:  General: No acute distress. Cardiovascular: RRR Lungs: unlabored Abdomen: Soft, nontender, nondistended  Neurological: Alert and oriented 3. Moves all extremities 4. Cranial nerves II through XII grossly intact. Skin: Warm and dry. No rashes or lesions. Extremities: No clubbing or cyanosis. No edema.    Data Reviewed: I have personally reviewed following labs and imaging studies  CBC: Recent Labs  Lab 12/18/2019 2207 01/10/2020 2207 01/10/2020 2247 12/24/19 0502 12/25/19 0507 12/26/19 0448 12/27/19 0441  WBC 9.8  --   --  14.9* 14.1* 14.3* 13.6*  NEUTROABS 8.6*  --   --  13.0* 12.8* 13.1* 12.4*  HGB  14.8   < > 14.6 14.6 13.5 13.2 13.1  HCT 43.6   < > 43.0 42.4 41.0 38.5 38.5  MCV 92.2  --   --  92.6 97.4 93.4 93.7  PLT 181  --   --  267 257 300 318   < > = values in this interval not displayed.    Basic Metabolic Panel: Recent Labs  Lab 12/24/19 0502 12/24/19 1655 12/25/19 0832 12/26/19 0448 12/27/19 0441  NA 128* 132* 133* 131* 128*  K 3.5 4.1 4.0 3.9 4.4  CL 97* 100 99 100 99  CO2 21* 19* 21* 19* 17*  GLUCOSE 163* 398* 272* 242* 252*  BUN 54* 66* 75* 97* 93*  CREATININE 1.91* 2.06* 2.25* 3.13* 3.60*  CALCIUM 7.3* 7.6* 7.5* 7.5* 7.2*  MG 2.5*  --  2.5* 2.5* 2.5*    GFR: Estimated Creatinine Clearance: 13 mL/min (Kathryn Richardson) (by C-G formula based on SCr of 3.6 mg/dL (H)).  Liver Function Tests: Recent Labs  Lab 12/24/19 0502 12/24/19 1655 12/25/19 0832 12/26/19 0448 12/27/19 0441  AST 19 20 18  14* 22  ALT 18 19 18 17 16   ALKPHOS 41 48 35* 38 47  BILITOT 0.7 0.9 1.2 0.5 0.8  PROT 5.8* 5.9* 5.1* 4.9* 4.8*  ALBUMIN 2.5* 2.5* 2.4* 2.0* 2.0*    CBG: Recent Labs  Lab 12/26/19 0750 12/26/19 1231 12/26/19 1608 12/26/19 2246 12/27/19 1145  GLUCAP 241* 325* 351* 277* 362*     Recent Results (from the past 240 hour(s))  SARS Coronavirus 2 by RT PCR (hospital order, performed in [facility] hospital lab) Nasopharyngeal Nasopharyngeal Swab     Status: Abnormal   Collection Time: 01/04/2020 10:07 PM   Specimen: Nasopharyngeal Swab  Result Value Ref Range Status   SARS Coronavirus 2 POSITIVE (Kathryn Richardson) NEGATIVE Final    Comment: RESULT CALLED TO, READ BACK BY AND VERIFIED WITH: Kathryn ADKINS RN AT 2315 ON 01/09/2020 BY Kathryn Richardson (NOTE) SARS-CoV-2 target nucleic acids are DETECTED  SARS-CoV-2 RNA is generally detectable in upper  respiratory specimens  during the acute phase of infection.  Positive results are indicative  of the presence of the identified virus, but do not rule out bacterial infection or co-infection with other pathogens not detected by the test.  Clinical  correlation with patient history and  other diagnostic information is necessary to determine patient infection status.  The expected result is negative.  Fact Sheet for Patients:   StrictlyIdeas.no   Fact Sheet for Healthcare Providers:   BankingDealers.co.za    This test is not yet approved or cleared by the [place] FDA and  has been authorized for detection and/or diagnosis of SARS-CoV-2 by FDA under an Emergency Use Authorization (EUA).  This EUA will remain in effect (mea ning this test can be used) for the duration of  the COVID-19 declaration under Section 564(b)(1) of the Act, 21 U.S.C. section 360-bbb-3(b)(1), unless the authorization is terminated or revoked sooner.  Performed at [facility], [address] [place], [place] [address]   Blood Culture (routine x 2)     Status: None (Preliminary result)   Collection Time: 01/07/2020 10:07 PM   Specimen: BLOOD  Result Value Ref Range Status   Specimen Description   Final    BLOOD BLOOD RIGHT HAND Performed at [facility], [address] [place], [place] [address]    Special Requests   Final    BOTTLES DRAWN AEROBIC AND ANAEROBIC Blood Culture adequate volume Performed at [facility], [address] [place], [place] [address]    Culture   Final    NO GROWTH 4 DAYS Performed at [facility], [place] [address], [place], [place] [address]    Report Status PENDING  Incomplete  Blood Culture (routine x 2)     Status: None (Preliminary result)   Collection Time: 01/03/2020 10:07 PM   Specimen: BLOOD  Result Value Ref Range Status   Specimen Description   Final    BLOOD LEFT ANTECUBITAL Performed at [facility], [address] [place], [place] [address]    Special Requests   Final    BOTTLES DRAWN AEROBIC AND ANAEROBIC Blood Culture adequate volume Performed at [facility], [address] [place],  [place] [address]    Culture   Final    NO GROWTH 4 DAYS Performed at [facility], [place] [address], [place], [place] [address]    Report Status PENDING  Incomplete         Radiology Studies: US RENAL  Result Date: 12/26/2019 CLINICAL DATA:  Acute kidney injury, stage III chronic kidney disease, COVID-19, hypertension, diabetes mellitus EXAM: RENAL / URINARY TRACT ULTRASOUND COMPLETE COMPARISON:  Abdominal ultrasound 01/28/2017 FINDINGS: Right Kidney: Renal measurements: 10.1 x 4.1 x 4.8 cm = volume: 104 mL. Mild cortical thinning. Upper normal cortical echogenicity. No mass or hydronephrosis. Left Kidney: Renal measurements: 8.3 x 4.3 x 4.8 cm = volume: 89 mL. Cortical thinning. Increased cortical echogenicity. No mass, hydronephrosis or shadowing calcification. Bladder: Appears normal for degree of bladder distention. Other: N/Kathryn Richardson IMPRESSION: Cortical thinning and probable medical renal disease changes of both kidneys. No evidence of renal mass or hydronephrosis. Electronically Signed   By: Lavonia Dana M.D.   On: 12/26/2019 11:58        Scheduled Meds: . apixaban  2.5 mg Oral BID  . atorvastatin  80 mg Oral QHS  . diltiazem  360 mg Oral Daily  .  insulin aspart  0-20 Units Subcutaneous TID WC  . insulin aspart  0-5 Units Subcutaneous QHS  . insulin aspart  8 Units Subcutaneous TID WC  . insulin glargine  66 Units Subcutaneous Daily  . levothyroxine  50 mcg Oral Daily  . linagliptin  5 mg Oral Daily  . methylPREDNISolone (SOLU-MEDROL) injection  45 mg Intravenous Q12H  . metoprolol succinate  25 mg Oral Daily  . sodium bicarbonate  650 mg Oral TID   Continuous Infusions: . sodium chloride 10 mL/hr at 12/21/2019 2356  . azithromycin 500 mg (12/27/19 1020)  . cefTRIAXone (ROCEPHIN)  IV 2 g (12/27/19 0916)  . lactated ringers 100 mL/hr at 12/27/19 1526     LOS: 4 days    Time spent: over 30 min    Fayrene Helper, MD [facility]   To contact the attending provider  between 7A-7P or the covering provider during after hours 7P-7A, please log into the web site [email or web address] and access using universal [place] password for that web site. If you do not have the password, please call the hospital operator.  12/27/2019, 3:30 PM

## 2019-12-28 ENCOUNTER — Inpatient Hospital Stay (HOSPITAL_COMMUNITY): Payer: Medicare Other

## 2019-12-28 DIAGNOSIS — E1165 Type 2 diabetes mellitus with hyperglycemia: Secondary | ICD-10-CM

## 2019-12-28 LAB — COMPREHENSIVE METABOLIC PANEL
ALT: 18 U/L (ref 0–44)
AST: 16 U/L (ref 15–41)
Albumin: 1.9 g/dL — ABNORMAL LOW (ref 3.5–5.0)
Alkaline Phosphatase: 62 U/L (ref 38–126)
Anion gap: 13 (ref 5–15)
BUN: 107 mg/dL — ABNORMAL HIGH (ref 8–23)
CO2: 18 mmol/L — ABNORMAL LOW (ref 22–32)
Calcium: 7.4 mg/dL — ABNORMAL LOW (ref 8.9–10.3)
Chloride: 99 mmol/L (ref 98–111)
Creatinine, Ser: 3.56 mg/dL — ABNORMAL HIGH (ref 0.44–1.00)
GFR calc Af Amer: 13 mL/min — ABNORMAL LOW (ref >60)
GFR calc non Af Amer: 11 mL/min — ABNORMAL LOW (ref >60)
Glucose, Bld: 327 mg/dL — ABNORMAL HIGH (ref 70–99)
Potassium: 4 mmol/L (ref 3.5–5.1)
Sodium: 130 mmol/L — ABNORMAL LOW (ref 135–145)
Total Bilirubin: 0.3 mg/dL (ref 0.3–1.2)
Total Protein: 4.7 g/dL — ABNORMAL LOW (ref 6.5–8.1)

## 2019-12-28 LAB — CBC WITH DIFFERENTIAL/PLATELET
Abs Immature Granulocytes: 0.2 10*3/uL — ABNORMAL HIGH (ref 0.00–0.07)
Basophils Absolute: 0 10*3/uL (ref 0.0–0.1)
Basophils Relative: 0 %
Eosinophils Absolute: 0 10*3/uL (ref 0.0–0.5)
Eosinophils Relative: 0 %
HCT: 37.5 % (ref 36.0–46.0)
Hemoglobin: 12.7 g/dL (ref 12.0–15.0)
Immature Granulocytes: 2 %
Lymphocytes Relative: 2 %
Lymphs Abs: 0.3 10*3/uL — ABNORMAL LOW (ref 0.7–4.0)
MCH: 31.4 pg (ref 26.0–34.0)
MCHC: 33.9 g/dL (ref 30.0–36.0)
MCV: 92.8 fL (ref 80.0–100.0)
Monocytes Absolute: 0.6 10*3/uL (ref 0.1–1.0)
Monocytes Relative: 4 %
Neutro Abs: 12.2 10*3/uL — ABNORMAL HIGH (ref 1.7–7.7)
Neutrophils Relative %: 92 %
Platelets: 318 10*3/uL (ref 150–400)
RBC: 4.04 MIL/uL (ref 3.87–5.11)
RDW: 15.1 % (ref 11.5–15.5)
WBC: 13.2 10*3/uL — ABNORMAL HIGH (ref 4.0–10.5)
nRBC: 0 % (ref 0.0–0.2)

## 2019-12-28 LAB — CULTURE, BLOOD (ROUTINE X 2)
Culture: NO GROWTH
Culture: NO GROWTH
Special Requests: ADEQUATE
Special Requests: ADEQUATE

## 2019-12-28 LAB — C-REACTIVE PROTEIN: CRP: 1.9 mg/dL — ABNORMAL HIGH (ref <1.0)

## 2019-12-28 LAB — GLUCOSE, CAPILLARY
Glucose-Capillary: 275 mg/dL — ABNORMAL HIGH (ref 70–99)
Glucose-Capillary: 362 mg/dL — ABNORMAL HIGH (ref 70–99)
Glucose-Capillary: 325 mg/dL — ABNORMAL HIGH (ref 70–99)
Glucose-Capillary: 295 mg/dL — ABNORMAL HIGH (ref 70–99)

## 2019-12-28 LAB — FERRITIN: Ferritin: 161 ng/mL (ref 11–307)

## 2019-12-28 LAB — D-DIMER, QUANTITATIVE: D-Dimer, Quant: 0.89 ug/mL-FEU — ABNORMAL HIGH (ref 0.00–0.50)

## 2019-12-28 LAB — MAGNESIUM: Magnesium: 2.6 mg/dL — ABNORMAL HIGH (ref 1.7–2.4)

## 2019-12-28 LAB — UREA NITROGEN, URINE: Urea Nitrogen, Ur: 881 mg/dL

## 2019-12-28 MED ORDER — SODIUM CHLORIDE 0.9 % IV SOLN: INTRAVENOUS | Status: DC

## 2019-12-28 NOTE — Plan of Care (Signed)
  Problem: Education: Goal: Knowledge of risk factors and measures for prevention of condition will improve Outcome: Progressing   Problem: Coping: Goal: Psychosocial and spiritual needs will be supported Outcome: Progressing   

## 2019-12-28 NOTE — Plan of Care (Signed)
  Problem: Education: Goal: Knowledge of risk factors and measures for prevention of condition will improve Outcome: Progressing   Problem: Coping: Goal: Psychosocial and spiritual needs will be supported Outcome: Progressing   Problem: Respiratory: Goal: Will maintain a patent airway Outcome: Progressing Goal: Complications related to the disease process, condition or treatment will be avoided or minimized Outcome: Progressing   

## 2019-12-28 NOTE — Progress Notes (Signed)
PROGRESS NOTE    Kathryn Richardson  TXM:[id number] DOB: 08/26/1934 DOA: 12/18/2019 PCP: [name], MD   Chief Complaint  Patient presents with   Covid positive    Brief Narrative:  Kathryn Richardson  is a 85 y.o. female, with history of type 2 diabetes mellitus, hyperlipidemia, GERD, hypertension, chronic renal disease stage IV, atrial fibrillation, and more presents to the ED with a chief complaint of difficulty breathing.  Patient reports symptoms started 9 days ago.  She had Covid test 8 days ago and it was positive.  She reports that she had monoclonal antibody infusion on 10 August.    However her symptoms continue to progress.  She presented to the emergency department.  She was noted to be hypoxic.  She was hospitalized for further management.    Assessment & Plan:  Acute hypoxic respiratory failure/pneumonia due to COVID-19  Patient was vaccinated back in February with Pfizer vaccine.  She was treated with RegenCov on August 10. On admission patient was started on remdesivir and steroids.  Due to elevated procalcitonin patient was started empirically on antibacterials.  Patient has completed a 5-day course. CRP only minimally elevated.  D-dimer also minimally elevated. Hold off on baricitinib due to use of antibacterial agents due to concern for secondary bacterial infection. This will be considered breakthrough infection.  Currently requiring about 5 to 6 L of oxygen.  Still complains of feeling short of breath. Continue incentive spirometry, mobilization, prone positioning as much as possible.  Repeat chest x-ray tomorrow morning.  COVID-19 Labs  Recent Labs    12/26/19 0448 12/27/19 0441 12/28/19 0412  DDIMER 0.81* 0.93* 0.89*  FERRITIN 181 186 161  CRP 3.7* 2.4* 1.9*    Acute kidney injury on chronic kidney disease stage IV/normal anion gap metabolic acidosis/hyponatremia Patient's baseline creatinine is around 1.7-2.2.  Came in with baseline renal function but has  worsened since admission.  Patient was on ACE inhibitor and was also given Lasix x1.  These agents were discontinued.  Patient was actually started on IV fluids.  UA was noted to be bland.  Urine sodium was less than 10.  Creatinine slightly better today.  Monitor urine output which has been low.  Renal ultrasound did not show any hydronephrosis.  Patient was also started on bicarbonate orally for metabolic acidosis.  Lactic acidosis This was mild.  Diabetes mellitus type 2, uncontrolled with hyperglycemia Poor glycemic control is likely due to steroids.  Patient is on Lantus SSI.  HbA1c 10.4.  History of paroxysmal atrial fibrillation Patient being continued on her Eliquis, diltiazem and also noted to be on metoprolol.  DVT prophylaxis: eliquis Code Status: full  Family Communication: We will update her family later today. Disposition: Hopefully return home in improved  Status is: Inpatient  Remains inpatient appropriate because:Inpatient level of care appropriate due to severity of illness   Dispo: The patient is from: Home              Anticipated d/c is to: pending              Anticipated d/c date is: > 3 days              Patient currently is not medically stable to d/c.   Consultants:   none  Procedures:  Echo IMPRESSIONS    1. Left ventricular ejection fraction, by estimation, is 50 to 55%. The  left ventricle has low normal function. The left ventricle has no regional  wall motion abnormalities.  There is mild concentric left ventricular  hypertrophy. Left ventricular  diastolic function could not be evaluated. The average left ventricular  global longitudinal strain is -7.1 %.  2. Right ventricular systolic function is mildly reduced. The right  ventricular size is mildly enlarged. There is normal pulmonary artery  systolic pressure. The estimated right ventricular systolic pressure is  50.2 mmHg.  3. Left atrial size was mildly dilated.  4. The mitral valve  is degenerative. Mild mitral valve regurgitation. No  evidence of mitral stenosis.  5. The aortic valve is tricuspid. Aortic valve regurgitation is not  visualized. No aortic stenosis is present.  6. The inferior vena cava is normal in size with greater than 50%  respiratory variability, suggesting right atrial pressure of 3 mmHg.   Comparison(s): Prior images unable to be directly viewed, comparison made  by report only. No significant change from prior study.    Antimicrobials:  Anti-infectives (From admission, onward)   Start     Dose/Rate Route Frequency Ordered Stop   12/24/19 1000  cefTRIAXone (ROCEPHIN) 2 g in sodium chloride 0.9 % 100 mL IVPB        2 g 200 mL/hr over 30 Minutes Intravenous Every 24 hours 12/24/19 0843 12/28/19 1001   12/24/19 1000  azithromycin (ZITHROMAX) 500 mg in sodium chloride 0.9 % 250 mL IVPB        500 mg 250 mL/hr over 60 Minutes Intravenous Every 24 hours 12/24/19 0843 12/28/19 1104   12/23/19 1000  remdesivir 100 mg in sodium chloride 0.9 % 100 mL IVPB       "Followed by" Linked Group Details   100 mg 200 mL/hr over 30 Minutes Intravenous Daily 12/14/2019 2317 12/26/19 0842   12/23/19 0001  remdesivir 100 mg in sodium chloride 0.9 % 100 mL IVPB       "Followed by" Linked Group Details   100 mg 200 mL/hr over 30 Minutes Intravenous  Once 01/10/2020 2317 12/23/19 0224   01/10/2020 2330  remdesivir 100 mg in sodium chloride 0.9 % 100 mL IVPB       "Followed by" Linked Group Details   100 mg 200 mL/hr over 30 Minutes Intravenous  Once 01/02/2020 2317 12/23/19 0052      Subjective: Patient continues to have shortness of breath.  Denies any chest pain.  No nausea or vomiting.  Objective: Vitals:   12/27/19 1355 12/27/19 2021 12/28/19 0448 12/28/19 0453  BP: (!) 117/51 138/77 117/62   Pulse: 67 68 66   Resp: 18 20 18    Temp: (!) 97.4 F (36.3 C) (!) 97.4 F (36.3 C) 97.6 F (36.4 C)   TempSrc: Oral Oral Oral   SpO2: (!) 88% 93% 92%   Weight:     98.3 kg  Height:        Intake/Output Summary (Last 24 hours) at 12/28/2019 1310 Last data filed at 12/28/2019 0900 Gross per 24 hour  Intake 983.64 ml  Output 500 ml  Net 483.64 ml   Filed Weights   12/26/19 2300 12/27/19 0549 12/28/19 0453  Weight: 95.3 kg 95 kg 98.3 kg    Examination:  General appearance: Awake alert.  In no distress Resp: Tachypneic at rest.  Coarse breath sounds with crackles bilateral bases.  No wheezing or rhonchi. Cardio: S1-S2 is normal regular.  No S3-S4.  No rubs murmurs or bruit GI: Abdomen is soft.  Nontender nondistended.  Bowel sounds are present normal.  No masses organomegaly Extremities: No edema.  Full range  of motion of lower extremities. Neurologic: Alert and oriented x3.  No focal neurological deficits.     Data Reviewed: I have personally reviewed following labs and imaging studies  CBC: Recent Labs  Lab 12/24/19 0502 12/25/19 0507 12/26/19 0448 12/27/19 0441 12/28/19 0412  WBC 14.9* 14.1* 14.3* 13.6* 13.2*  NEUTROABS 13.0* 12.8* 13.1* 12.4* 12.2*  HGB 14.6 13.5 13.2 13.1 12.7  HCT 42.4 41.0 38.5 38.5 37.5  MCV 92.6 97.4 93.4 93.7 92.8  PLT 267 257 300 318 417    Basic Metabolic Panel: Recent Labs  Lab 12/24/19 0502 12/24/19 1655 12/25/19 0832 12/26/19 0448 12/27/19 0441 12/27/19 1553 12/28/19 0412  NA 128*   < > 133* 131* 128* 132* 130*  K 3.5   < > 4.0 3.9 4.4 3.9 4.0  CL 97*   < > 99 100 99 98 99  CO2 21*   < > 21* 19* 17* 18* 18*  GLUCOSE 163*   < > 272* 242* 252* 316* 327*  BUN 54*   < > 75* 97* 93* 116* 107*  CREATININE 1.91*   < > 2.25* 3.13* 3.60* 3.78* 3.56*  CALCIUM 7.3*   < > 7.5* 7.5* 7.2* 7.6* 7.4*  MG 2.5*  --  2.5* 2.5* 2.5*  --  2.6*   < > = values in this interval not displayed.    GFR: Estimated Creatinine Clearance: 13.4 mL/min (A) (by C-G formula based on SCr of 3.56 mg/dL (H)).  Liver Function Tests: Recent Labs  Lab 12/24/19 1655 12/25/19 0832 12/26/19 0448 12/27/19 0441  12/28/19 0412  AST 20 18 14* 22 16  ALT 19 18 17 16 18   ALKPHOS 48 35* 38 47 62  BILITOT 0.9 1.2 0.5 0.8 0.3  PROT 5.9* 5.1* 4.9* 4.8* 4.7*  ALBUMIN 2.5* 2.4* 2.0* 2.0* 1.9*    CBG: Recent Labs  Lab 12/27/19 1145 12/27/19 1716 12/27/19 2107 12/28/19 0812 12/28/19 1140  GLUCAP 362* 288* 292* 275* 362*     Recent Results (from the past 240 hour(s))  SARS Coronavirus 2 by RT PCR (hospital order, performed in [facility] hospital lab) Nasopharyngeal Nasopharyngeal Swab     Status: Abnormal   Collection Time: 12/18/2019 10:07 PM   Specimen: Nasopharyngeal Swab  Result Value Ref Range Status   SARS Coronavirus 2 POSITIVE (A) NEGATIVE Final    Comment: RESULT CALLED TO, READ BACK BY AND VERIFIED WITH: LISA ADKINS RN AT 2315 ON 12/28/2019 BY [email or web address] (NOTE) SARS-CoV-2 target nucleic acids are DETECTED  SARS-CoV-2 RNA is generally detectable in upper respiratory specimens  during the acute phase of infection.  Positive results are indicative  of the presence of the identified virus, but do not rule out bacterial infection or co-infection with other pathogens not detected by the test.  Clinical correlation with patient history and  other diagnostic information is necessary to determine patient infection status.  The expected result is negative.  Fact Sheet for Patients:   StrictlyIdeas.no   Fact Sheet for Healthcare Providers:   BankingDealers.co.za    This test is not yet approved or cleared by the [place] FDA and  has been authorized for detection and/or diagnosis of SARS-CoV-2 by FDA under an Emergency Use Authorization (EUA).  This EUA will remain in effect (mea ning this test can be used) for the duration of  the COVID-19 declaration under Section 564(b)(1) of the Act, 21 U.S.C. section 360-bbb-3(b)(1), unless the authorization is terminated or revoked sooner.  Performed at [facility],  [place], [place] [address]   Blood Culture (routine x 2)     Status: None   Collection Time: 12/21/2019 10:07 PM   Specimen: BLOOD  Result Value Ref Range Status   Specimen Description   Final    BLOOD BLOOD RIGHT HAND Performed at [facility], [address] [place], [place] [address]    Special Requests   Final    BOTTLES DRAWN AEROBIC AND ANAEROBIC Blood Culture adequate volume Performed at [facility], [address] [place], [place] [address]    Culture   Final    NO GROWTH 5 DAYS Performed at [facility], [place] [address] [place], [place] [address]    Report Status 12/28/2019 FINAL  Final  Blood Culture (routine x 2)     Status: None   Collection Time: 01/06/2020 10:07 PM   Specimen: BLOOD  Result Value Ref Range Status   Specimen Description   Final    BLOOD LEFT ANTECUBITAL Performed at [facility], [address] [place], [place] [address]    Special Requests   Final    BOTTLES DRAWN AEROBIC AND ANAEROBIC Blood Culture adequate volume Performed at [facility], [address] [place], [place] [address]    Culture   Final    NO GROWTH 5 DAYS Performed at [facility], [place] [address], [place], [place] [address]    Report Status 12/28/2019 FINAL  Final         Radiology Studies: DG CHEST PORT 1 VIEW  Result Date: 12/28/2019 CLINICAL DATA:  Hypoxia EXAM: PORTABLE CHEST 1 VIEW COMPARISON:  December 22, 2019 FINDINGS: There is persistent airspace opacity in the right mid and lower lung regions as well as in the left base region. No new opacity evident. Heart is mildly enlarged with pacemaker leads attached to the right atrium and right ventricle. No appreciable adenopathy. No bone lesions. No pneumothorax. IMPRESSION: Airspace opacity bilaterally, more on the right than on the left, essentially stable. Stable cardiac prominence with pacemaker leads present in the right atrium and right ventricle regions.  Electronically Signed   By: Lowella Grip III M.D.   On: 12/28/2019 08:13     Scheduled Meds:  apixaban  2.5 mg Oral BID   atorvastatin  80 mg Oral QHS   diltiazem  360 mg Oral Daily   insulin aspart  0-20 Units Subcutaneous TID WC   insulin aspart  0-5 Units Subcutaneous QHS   insulin aspart  14 Units Subcutaneous TID WC   insulin glargine  70 Units Subcutaneous Daily   levothyroxine  50 mcg Oral Daily   linagliptin  5 mg Oral Daily   methylPREDNISolone (SOLU-MEDROL) injection  45 mg Intravenous Q12H   metoprolol succinate  25 mg Oral Daily   sodium bicarbonate  650 mg Oral TID   Continuous Infusions:  sodium chloride 10 mL/hr at 01/10/2020 2356     LOS: 5 days   Bonnielee Haff, MD [facility]   To contact the attending provider between 7A-7P or the covering provider during after hours 7P-7A, please log into the web site [email or web address] and access using universal [place] password for that web site. If you do not have the password, please call the hospital operator.  12/28/2019, 1:10 PM

## 2019-12-28 NOTE — Progress Notes (Signed)
Patient has voided 200 ml in just over 10 hours. Bladder Scan yielded 0 ml. Patient stated that she was not in any discomfort. PCP was notified

## 2019-12-29 LAB — COMPREHENSIVE METABOLIC PANEL
ALT: 19 U/L (ref 0–44)
AST: 16 U/L (ref 15–41)
Albumin: 1.9 g/dL — ABNORMAL LOW (ref 3.5–5.0)
Alkaline Phosphatase: 58 U/L (ref 38–126)
Anion gap: 10 (ref 5–15)
BUN: 106 mg/dL — ABNORMAL HIGH (ref 8–23)
CO2: 19 mmol/L — ABNORMAL LOW (ref 22–32)
Calcium: 7.3 mg/dL — ABNORMAL LOW (ref 8.9–10.3)
Chloride: 103 mmol/L (ref 98–111)
Creatinine, Ser: 3.48 mg/dL — ABNORMAL HIGH (ref 0.44–1.00)
GFR calc Af Amer: 13 mL/min — ABNORMAL LOW (ref >60)
GFR calc non Af Amer: 12 mL/min — ABNORMAL LOW (ref >60)
Glucose, Bld: 214 mg/dL — ABNORMAL HIGH (ref 70–99)
Potassium: 4 mmol/L (ref 3.5–5.1)
Sodium: 132 mmol/L — ABNORMAL LOW (ref 135–145)
Total Bilirubin: 0.4 mg/dL (ref 0.3–1.2)
Total Protein: 4.6 g/dL — ABNORMAL LOW (ref 6.5–8.1)

## 2019-12-29 LAB — GLUCOSE, CAPILLARY
Glucose-Capillary: 163 mg/dL — ABNORMAL HIGH (ref 70–99)
Glucose-Capillary: 215 mg/dL — ABNORMAL HIGH (ref 70–99)
Glucose-Capillary: 197 mg/dL — ABNORMAL HIGH (ref 70–99)
Glucose-Capillary: 181 mg/dL — ABNORMAL HIGH (ref 70–99)

## 2019-12-29 NOTE — Progress Notes (Signed)
PROGRESS NOTE    Kathryn Richardson  BDZ:[id number] DOB: 1935/02/10 DOA: 12/23/2019 PCP: [name], MD   Chief Complaint  Patient presents with  . Covid positive    Brief Narrative:  Kathryn Richardson  is a 85 y.o. female, with history of type 2 diabetes mellitus, hyperlipidemia, GERD, hypertension, chronic renal disease stage IV, atrial fibrillation, and more presents to the ED with a chief complaint of difficulty breathing.  Patient reports symptoms started 9 days ago.  She had Covid test 8 days ago and it was positive.  She reports that she had monoclonal antibody infusion on 10 August.    However her symptoms continue to progress.  She presented to the emergency department.  She was noted to be hypoxic.  She was hospitalized for further management.    Assessment & Plan:  Acute hypoxic respiratory failure/pneumonia due to COVID-19  Patient was vaccinated back in February. This will be considered breakthrough infection.   She was treated with RegenCov on August 10. Patient presented with hypoxia. On admission patient was started on remdesivir and steroids.  Due to elevated procalcitonin patient was started empirically on antibacterials.  Patient has completed a 5-day course. Inflammatory markers are only minimally elevated.  Hold off on baricitinib due to use of antibacterial agents due to concern for secondary bacterial infection. Patient continues to require 4 to 5 L of oxygen saturating in the late 80s to early 90s. Continue incentive spirometry mobilization prone positioning as much as possible.   COVID-19 Labs  Recent Labs    12/27/19 0441 12/28/19 0412  DDIMER 0.93* 0.89*  FERRITIN 186 161  CRP 2.4* 1.9*    Acute kidney injury on chronic kidney disease stage IV/normal anion gap metabolic acidosis/hyponatremia Patient's baseline creatinine is around 1.7-2.2.  Came in with baseline renal function but has worsened since admission.  Patient was on ACE inhibitor and was also given  Lasix x1.  These agents were discontinued.   Patient was started on IV fluids. UA was reviewed. Urine sodium was less than 10. Patient's urine output appears to have picked up. Her creatinine is better compared to yesterday. Bicarbonate remains low. Continue oral sodium bicarbonate. Renal ultrasound did not show any hydronephrosis.   Lactic acidosis This was mild.  Diabetes mellitus type 2, uncontrolled with hyperglycemia Poor glycemic control is likely due to steroids.  Patient is on Lantus SSI.  HbA1c 10.4. Continue current medication regimen. Adjust depending on trends over the next 24 hours.  History of paroxysmal atrial fibrillation Patient being continued on her Eliquis, diltiazem and also noted to be on metoprolol.  DVT prophylaxis: Eliquis Code Status: Full code Family Communication: Patient's daughter was updated yesterday. Disposition: Hopefully return home in improved  Status is: Inpatient  Remains inpatient appropriate because:IV treatments appropriate due to intensity of illness or inability to take PO and Inpatient level of care appropriate due to severity of illness   Dispo:  Patient From: Home  Planned Disposition: Home  Expected discharge date: 12/31/19  Medically stable for discharge: No       Consultants:   none  Procedures:  Echo IMPRESSIONS    1. Left ventricular ejection fraction, by estimation, is 50 to 55%. The  left ventricle has low normal function. The left ventricle has no regional  wall motion abnormalities. There is mild concentric left ventricular  hypertrophy. Left ventricular  diastolic function could not be evaluated. The average left ventricular  global longitudinal strain is -7.1 %.  2. Right ventricular systolic  function is mildly reduced. The right  ventricular size is mildly enlarged. There is normal pulmonary artery  systolic pressure. The estimated right ventricular systolic pressure is  37.1 mmHg.  3. Left atrial size  was mildly dilated.  4. The mitral valve is degenerative. Mild mitral valve regurgitation. No  evidence of mitral stenosis.  5. The aortic valve is tricuspid. Aortic valve regurgitation is not  visualized. No aortic stenosis is present.  6. The inferior vena cava is normal in size with greater than 50%  respiratory variability, suggesting right atrial pressure of 3 mmHg.   Comparison(s): Prior images unable to be directly viewed, comparison made  by report only. No significant change from prior study.    Antimicrobials:  Anti-infectives (From admission, onward)   Start     Dose/Rate Route Frequency Ordered Stop   12/24/19 1000  cefTRIAXone (ROCEPHIN) 2 g in sodium chloride 0.9 % 100 mL IVPB        2 g 200 mL/hr over 30 Minutes Intravenous Every 24 hours 12/24/19 0843 12/28/19 1001   12/24/19 1000  azithromycin (ZITHROMAX) 500 mg in sodium chloride 0.9 % 250 mL IVPB        500 mg 250 mL/hr over 60 Minutes Intravenous Every 24 hours 12/24/19 0843 12/28/19 1104   12/23/19 1000  remdesivir 100 mg in sodium chloride 0.9 % 100 mL IVPB       "Followed by" Linked Group Details   100 mg 200 mL/hr over 30 Minutes Intravenous Daily 01/07/2020 2317 12/26/19 0842   12/23/19 0001  remdesivir 100 mg in sodium chloride 0.9 % 100 mL IVPB       "Followed by" Linked Group Details   100 mg 200 mL/hr over 30 Minutes Intravenous  Once 12/12/2019 2317 12/23/19 0224   12/25/2019 2330  remdesivir 100 mg in sodium chloride 0.9 % 100 mL IVPB       "Followed by" Linked Group Details   100 mg 200 mL/hr over 30 Minutes Intravenous  Once 12/14/2019 2317 12/23/19 0052      Subjective: Patient states that she is feeling slightly better terms of her shortness of breath. Continues to have cough. Denies any chest pain. No nausea vomiting. Still feels fatigued at times.  Objective: Vitals:   12/28/19 1347 12/28/19 2000 12/29/19 0525 12/29/19 1219  BP: 128/67 116/68 132/70 (!) 125/57  Pulse: 61 64 68 (!) 58  Resp:  18 20 20 15   Temp: (!) 97.4 F (36.3 C) (!) 97.5 F (36.4 C) 97.8 F (36.6 C) (!) 97.3 F (36.3 C)  TempSrc: Oral Oral Oral Oral  SpO2: 91% (!) 88% 96% (!) 88%  Weight:   102.6 kg   Height:        Intake/Output Summary (Last 24 hours) at 12/29/2019 1256 Last data filed at 12/29/2019 0534 Gross per 24 hour  Intake 130.33 ml  Output 350 ml  Net -219.67 ml   Filed Weights   12/27/19 0549 12/28/19 0453 12/29/19 0525  Weight: 95 kg 98.3 kg 102.6 kg    Examination:  General appearance: Awake alert.  In no distress Resp: Tachypneic at rest. Coarse breath sounds with few crackles at the bilateral bases. No wheezing or rhonchi.  Cardio: S1-S2 is normal regular.  No S3-S4.  No rubs murmurs or bruit GI: Abdomen is soft.  Nontender nondistended.  Bowel sounds are present normal.  No masses organomegaly Extremities: No edema.  Full range of motion of lower extremities. Neurologic: Alert and oriented x3.  No  focal neurological deficits.     Data Reviewed: I have personally reviewed following labs and imaging studies  CBC: Recent Labs  Lab 12/24/19 0502 12/25/19 0507 12/26/19 0448 12/27/19 0441 12/28/19 0412  WBC 14.9* 14.1* 14.3* 13.6* 13.2*  NEUTROABS 13.0* 12.8* 13.1* 12.4* 12.2*  HGB 14.6 13.5 13.2 13.1 12.7  HCT 42.4 41.0 38.5 38.5 37.5  MCV 92.6 97.4 93.4 93.7 92.8  PLT 267 257 300 318 160    Basic Metabolic Panel: Recent Labs  Lab 12/24/19 0502 12/24/19 1655 12/25/19 0832 12/25/19 0832 12/26/19 0448 12/27/19 0441 12/27/19 1553 12/28/19 0412 12/29/19 0421  NA 128*   < > 133*   < > 131* 128* 132* 130* 132*  K 3.5   < > 4.0   < > 3.9 4.4 3.9 4.0 4.0  CL 97*   < > 99   < > 100 99 98 99 103  CO2 21*   < > 21*   < > 19* 17* 18* 18* 19*  GLUCOSE 163*   < > 272*   < > 242* 252* 316* 327* 214*  BUN 54*   < > 75*   < > 97* 93* 116* 107* 106*  CREATININE 1.91*   < > 2.25*   < > 3.13* 3.60* 3.78* 3.56* 3.48*  CALCIUM 7.3*   < > 7.5*   < > 7.5* 7.2* 7.6* 7.4* 7.3*   MG 2.5*  --  2.5*  --  2.5* 2.5*  --  2.6*  --    < > = values in this interval not displayed.    GFR: Estimated Creatinine Clearance: 14 mL/min (A) (by C-G formula based on SCr of 3.48 mg/dL (H)).  Liver Function Tests: Recent Labs  Lab 12/25/19 7371 12/26/19 0448 12/27/19 0441 12/28/19 0412 12/29/19 0421  AST 18 14* 22 16 16   ALT 18 17 16 18 19   ALKPHOS 35* 38 47 62 58  BILITOT 1.2 0.5 0.8 0.3 0.4  PROT 5.1* 4.9* 4.8* 4.7* 4.6*  ALBUMIN 2.4* 2.0* 2.0* 1.9* 1.9*    CBG: Recent Labs  Lab 12/28/19 1140 12/28/19 1724 12/28/19 2058 12/29/19 0753 12/29/19 1151  GLUCAP 362* 325* 295* 163* 215*     Recent Results (from the past 240 hour(s))  SARS Coronavirus 2 by RT PCR (hospital order, performed in [facility] hospital lab) Nasopharyngeal Nasopharyngeal Swab     Status: Abnormal   Collection Time: 12/15/2019 10:07 PM   Specimen: Nasopharyngeal Swab  Result Value Ref Range Status   SARS Coronavirus 2 POSITIVE (A) NEGATIVE Final    Comment: RESULT CALLED TO, READ BACK BY AND VERIFIED WITH: LISA ADKINS RN AT 2315 ON 01/01/2020 BY [email or web address] (NOTE) SARS-CoV-2 target nucleic acids are DETECTED  SARS-CoV-2 RNA is generally detectable in upper respiratory specimens  during the acute phase of infection.  Positive results are indicative  of the presence of the identified virus, but do not rule out bacterial infection or co-infection with other pathogens not detected by the test.  Clinical correlation with patient history and  other diagnostic information is necessary to determine patient infection status.  The expected result is negative.  Fact Sheet for Patients:   StrictlyIdeas.no   Fact Sheet for Healthcare Providers:   BankingDealers.co.za    This test is not yet approved or cleared by the [place] FDA and  has been authorized for detection and/or diagnosis of SARS-CoV-2 by FDA under an Emergency Use Authorization  (EUA).  This EUA will remain in effect (mea  ning this test can be used) for the duration of  the COVID-19 declaration under Section 564(b)(1) of the Act, 21 U.S.C. section 360-bbb-3(b)(1), unless the authorization is terminated or revoked sooner.  Performed at [facility], [address] [place], [place] [address]   Blood Culture (routine x 2)     Status: None   Collection Time: 12/13/2019 10:07 PM   Specimen: BLOOD  Result Value Ref Range Status   Specimen Description   Final    BLOOD BLOOD RIGHT HAND Performed at [facility], [address] [place], [place] [address]    Special Requests   Final    BOTTLES DRAWN AEROBIC AND ANAEROBIC Blood Culture adequate volume Performed at [facility], [address] [place], [place] [address]    Culture   Final    NO GROWTH 5 DAYS Performed at [facility], [place] [address], [place], [place] [address]    Report Status 12/28/2019 FINAL  Final  Blood Culture (routine x 2)     Status: None   Collection Time: 12/15/2019 10:07 PM   Specimen: BLOOD  Result Value Ref Range Status   Specimen Description   Final    BLOOD LEFT ANTECUBITAL Performed at [facility], [address] [place], [place] [address]    Special Requests   Final    BOTTLES DRAWN AEROBIC AND ANAEROBIC Blood Culture adequate volume Performed at [facility], [address] [place], [place] [address]    Culture   Final    NO GROWTH 5 DAYS Performed at [facility], [place] [address] [place], [place] [address]    Report Status 12/28/2019 FINAL  Final         Radiology Studies: DG CHEST PORT 1 VIEW  Result Date: 12/28/2019 CLINICAL DATA:  Hypoxia EXAM: PORTABLE CHEST 1 VIEW COMPARISON:  December 22, 2019 FINDINGS: There is persistent airspace opacity in the right mid and lower lung regions as well as in the left base region. No new opacity evident. Heart is mildly enlarged with pacemaker leads attached to  the right atrium and right ventricle. No appreciable adenopathy. No bone lesions. No pneumothorax. IMPRESSION: Airspace opacity bilaterally, more on the right than on the left, essentially stable. Stable cardiac prominence with pacemaker leads present in the right atrium and right ventricle regions. Electronically Signed   By: Lowella Grip III M.D.   On: 12/28/2019 08:13     Scheduled Meds: . apixaban  2.5 mg Oral BID  . atorvastatin  80 mg Oral QHS  . diltiazem  360 mg Oral Daily  . insulin aspart  0-20 Units Subcutaneous TID WC  . insulin aspart  0-5 Units Subcutaneous QHS  . insulin aspart  14 Units Subcutaneous TID WC  . insulin glargine  70 Units Subcutaneous Daily  . levothyroxine  50 mcg Oral Daily  . linagliptin  5 mg Oral Daily  . methylPREDNISolone (SOLU-MEDROL) injection  45 mg Intravenous Q12H  . metoprolol succinate  25 mg Oral Daily  . sodium bicarbonate  650 mg Oral TID   Continuous Infusions: . sodium chloride 10 mL/hr at 01/10/2020 2356  . sodium chloride 100 mL/hr at 12/29/19 0044     LOS: 6 days   Bonnielee Haff, MD [facility]   To contact the attending provider between 7A-7P or the covering provider during after hours 7P-7A, please log into the web site [email or web address] and access  using universal [place] password for that web site. If you do not have the password, please call the hospital operator.  12/29/2019, 12:56 PM

## 2019-12-29 NOTE — Progress Notes (Signed)
Inpatient Diabetes Program Recommendations  AACE/ADA: New Consensus Statement on Inpatient Glycemic Control (2015)  Target Ranges:  Prepandial:   less than 140 mg/dL      Peak postprandial:   less than 180 mg/dL (1-2 hours)      Critically ill patients:  140 - 180 mg/dL   Lab Results  Component Value Date   GLUCAP 163 (H) 12/29/2019   HGBA1C 10.4 (H) 12/25/2019    Review of Glycemic Control  Diabetes history: DM2 Outpatient Diabetes medications:  Current orders for Inpatient glycemic control: Lantus 70 units QD, Novolog 0-20 units tidwc and hs + 14 units tidwc, tradjenta 5 mg QD  Solumedrol 45 mg Q12H Post-prandials elevated  Inpatient Diabetes Program Recommendations:     Increase Novolog to 16 units tidwc. If FBS continues > 180 mg/dL, increase Lantus to 72 units QD.  Will follow closely.  Thank you. Lorenda Peck, RD, LDN, CDE Inpatient Diabetes Coordinator [phone]

## 2019-12-29 NOTE — Care Management Important Message (Signed)
Important Message  Patient Details IM Letter given to the Patient Name: Kathryn Richardson MRN: [id number] Date of Birth: 05/10/1935   Medicare Important Message Given:  Yes     Kerin Salen 12/29/2019, 10:18 AM

## 2019-12-29 NOTE — Progress Notes (Signed)
Physical Therapy Treatment Patient Details Name: Kathryn Richardson MRN: [id number] DOB: [birth year] Today's Date: 12/29/2019    History of Present Illness 85 yo female admitted with COVID. hx of pacemaker, afib, dm, gout, ckd    PT Comments    Pt was up in recliner eating lunch upon my arrival. She stated she felt too weak to attempt standing, but agreed to seated UE/LE exercises. Instructed pt in BUE/LE strengthening exercises and encouraged her to perform these independently 2-3x/day to minimize further deconditioning. Reviewed use of flutter valve. SaO2 87% on 5L O2 with seated activity, 90% on 5L O2 at rest.    Follow Up Recommendations  Home health PT;Supervision/Assistance - 24 hour     Equipment Recommendations  Rolling walker with 5" wheels    Recommendations for Other Services       Precautions / Restrictions Precautions Precautions: Fall Precaution Comments: monitor O2 Restrictions Weight Bearing Restrictions: No    Mobility  Bed Mobility               General bed mobility comments: up in recliner  Transfers                 General transfer comment: pt recently got up to  recliner with nursing, she stated she's too tired and weak to attempt standing during PT session  Ambulation/Gait                 Stairs             Wheelchair Mobility    Modified Rankin (Stroke Patients Only)       Balance                                            Cognition Arousal/Alertness: Awake/alert Behavior During Therapy: WFL for tasks assessed/performed Overall Cognitive Status: Within Functional Limits for tasks assessed                                        Exercises General Exercises - Upper Extremity Shoulder Flexion: AROM;Both;10 reps;Seated General Exercises - Lower Extremity Ankle Circles/Pumps: AROM;Both;10 reps;Seated Gluteal Sets: AROM;Both;5 reps;Seated Long Arc Quad: AROM;Both;10  reps;Seated Hip Flexion/Marching: AROM;Both;10 reps;Seated    General Comments General comments (skin integrity, edema, etc.): SaO2 90% at rest on 5L O2 [place], 87-88% on 5L O2 with seated activity      Pertinent Vitals/Pain Pain Assessment: No/denies pain    Home Living                      Prior Function            PT Goals (current goals can now be found in the care plan section) Acute Rehab PT Goals Patient Stated Goal: cook, clean PT Goal Formulation: With patient Time For Goal Achievement: Feb 02, 2020 Potential to Achieve Goals: Good Progress towards PT goals: Progressing toward goals    Frequency    Min 3X/week      PT Plan Current plan remains appropriate    Co-evaluation              AM-PAC PT "6 Clicks" Mobility   Outcome Measure  Help needed turning from your back to your side while in a flat bed without using bedrails?: A Little Help needed  moving from lying on your back to sitting on the side of a flat bed without using bedrails?: A Little Help needed moving to and from a bed to a chair (including a wheelchair)?: A Little Help needed standing up from a chair using your arms (e.g., wheelchair or bedside chair)?: A Little Help needed to walk in hospital room?: A Little Help needed climbing 3-5 steps with a railing? : A Lot 6 Click Score: 17    End of Session Equipment Utilized During Treatment: Gait belt;Oxygen Activity Tolerance: Patient limited by fatigue Patient left: with call bell/phone within reach;in chair;with chair alarm set Nurse Communication: Mobility status PT Visit Diagnosis: Muscle weakness (generalized) (M62.81);Difficulty in walking, not elsewhere classified (R26.2)     Time: 5465-6812 PT Time Calculation (min) (ACUTE ONLY): 17 min  Charges:  $Therapeutic Exercise: 8-22 mins                    [name] PT 12/29/2019  Acute Rehabilitation Services Pager [phone] Office [phone]

## 2019-12-29 NOTE — Plan of Care (Signed)
  Problem: Education: Goal: Knowledge of risk factors and measures for prevention of condition will improve Outcome: Progressing   Problem: Coping: Goal: Psychosocial and spiritual needs will be supported Outcome: Progressing   Problem: Respiratory: Goal: Will maintain a patent airway Outcome: Progressing Goal: Complications related to the disease process, condition or treatment will be avoided or minimized Outcome: Progressing   

## 2019-12-30 ENCOUNTER — Inpatient Hospital Stay (HOSPITAL_COMMUNITY): Payer: Medicare Other

## 2019-12-30 LAB — COMPREHENSIVE METABOLIC PANEL
ALT: 18 U/L (ref 0–44)
AST: 16 U/L (ref 15–41)
Albumin: 1.9 g/dL — ABNORMAL LOW (ref 3.5–5.0)
Alkaline Phosphatase: 51 U/L (ref 38–126)
Anion gap: 10 (ref 5–15)
BUN: 93 mg/dL — ABNORMAL HIGH (ref 8–23)
CO2: 20 mmol/L — ABNORMAL LOW (ref 22–32)
Calcium: 7.4 mg/dL — ABNORMAL LOW (ref 8.9–10.3)
Chloride: 106 mmol/L (ref 98–111)
Creatinine, Ser: 3.49 mg/dL — ABNORMAL HIGH (ref 0.44–1.00)
GFR calc Af Amer: 13 mL/min — ABNORMAL LOW (ref >60)
GFR calc non Af Amer: 11 mL/min — ABNORMAL LOW (ref >60)
Glucose, Bld: 104 mg/dL — ABNORMAL HIGH (ref 70–99)
Potassium: 4.5 mmol/L (ref 3.5–5.1)
Sodium: 136 mmol/L (ref 135–145)
Total Bilirubin: 0.5 mg/dL (ref 0.3–1.2)
Total Protein: 4.8 g/dL — ABNORMAL LOW (ref 6.5–8.1)

## 2019-12-30 LAB — GLUCOSE, CAPILLARY
Glucose-Capillary: 105 mg/dL — ABNORMAL HIGH (ref 70–99)
Glucose-Capillary: 149 mg/dL — ABNORMAL HIGH (ref 70–99)
Glucose-Capillary: 133 mg/dL — ABNORMAL HIGH (ref 70–99)
Glucose-Capillary: 143 mg/dL — ABNORMAL HIGH (ref 70–99)

## 2019-12-30 LAB — C-REACTIVE PROTEIN: CRP: 2.7 mg/dL — ABNORMAL HIGH (ref <1.0)

## 2019-12-30 MED ORDER — ALBUMIN HUMAN 25 % IV SOLN
25.0000 g | Freq: Once | INTRAVENOUS | Status: AC
  Administered 2019-12-30: 25 g via INTRAVENOUS
  Filled 2019-12-30: qty 100

## 2019-12-30 MED ORDER — FUROSEMIDE 10 MG/ML IJ SOLN
80.0000 mg | Freq: Once | INTRAMUSCULAR | Status: AC
  Administered 2019-12-30: 80 mg via INTRAVENOUS
  Filled 2019-12-30: qty 8

## 2019-12-30 NOTE — Consult Note (Signed)
Nephrology Consult  [facility]  Requesting provider: Bonnielee Haff, MD Reason for consult: aki, hypervolemia   Assessment/Recommendations: Kathryn Richardson is a/an 85 y.o. female with a past medical history notable for AKI    Non-Oliguric AKI (stable) on CKD 4: Likely secondary to [place], may very well be intravascularly depleted (hyaline casts present on previous UA) -baseline Cr around 1.8-2.2. Follows with Dr. Kern Alberta at [facility]. Underlying CKD secondary to longstanding DM and poorly controlled HTN. -moving forward would pursue spot diuresis. I do not believe she needs any more fluids or diuretics for now. -no obstruction on renal ultrasound: cortical thinning and probable medical renal disease of both kidneys -Continue to monitor daily Cr, Dose meds for GFR<15 -Monitor Daily I/Os, Daily weight  -May obtain urine sample if able -Maintain MAP>65 for optimal renal perfusion.  -Agree with holding ACE-I, avoid further nephrotoxins including NSAIDS, Morphine.  Unless absolutely necessary, avoid CT with contrast and/or MRI with gadolinium.     COVID-19 Pneumonia/AHRF -remdesivir, steroids. Completed empiric abx -mgmt per primary service. On supplemental O2  Anasarca -likely third spacing from hypoalbuminemia -recommend adding protein supplements -moving forward, would do spot diuresis. Trial lasix with albumin today x 1 dose. No need for continuous diuretics since her swelling is stable (along with her o2 requirements)  Hypoalbuminemia  -alb 1.9. recommend starting protein supplements I.e. ensure, boost, etc  Non-gap Metabolic Acidosis -corrected anion gap 15 -likely related to AKI -agree with nahco3  Hypertension: -continue with toprol xl and cardizem  Afib -renally dose eliquis -rate control per primary service  Uncontrolled Diabetes Mellitus Type 2 with Hyperglycemia -mgmt per primary service   Recommendations conveyed to primary service.    [facility] 12/30/2019 12:41 PM   _____________________________________________________________________________________   History of Present Illness: Kathryn Richardson is a/an 85 y.o. female with a past medical history of DM2, HLD, GERD, HTN, CKD4, afib who presents to [facility] with SOB. COVID positive 8 days prior to admission (vaccinated back in Feb) and was given RegenCov on 8/10 however symptoms did not improve. Since being here she has been on remdesivir and steroids. O2 requirements have remained stable at 4-5L. Her CXR this am revealed stable findings (less likely to be from pulm edema). Otherwise she feels fine, eager to go home. She does not feel like her swelling is bothering her. Denies chest pain, shortness of breath currently.   Medications:  Current Facility-Administered Medications  Medication Dose Route Frequency Provider Last Rate Last Admin  . 0.9 %  sodium chloride infusion   Intravenous PRN Zierle-Ghosh, Asia B, DO 10 mL/hr at 01/01/2020 2356 New Bag at 01/03/2020 2356  . acetaminophen (TYLENOL) tablet 650 mg  650 mg Oral Q6H PRN Zierle-Ghosh, Asia B, DO      . albuterol (VENTOLIN HFA) 108 (90 Base) MCG/ACT inhaler 2 puff  2 puff Inhalation Q4H PRN Zierle-Ghosh, Asia B, DO      . apixaban (ELIQUIS) tablet 2.5 mg  2.5 mg Oral BID Zierle-Ghosh, Asia B, DO   2.5 mg at 12/30/19 0920  . atorvastatin (LIPITOR) tablet 80 mg  80 mg Oral QHS Zierle-Ghosh, Asia B, DO   80 mg at 12/29/19 2150  . chlorpheniramine-HYDROcodone (TUSSIONEX) 10-8 MG/5ML suspension 5 mL  5 mL Oral Q12H PRN Zierle-Ghosh, Asia B, DO   5 mL at 12/30/19 0919  . diltiazem (CARDIZEM CD) 24 hr capsule 360 mg  360 mg Oral Daily Zierle-Ghosh, Asia B, DO   360 mg at 12/30/19  0920  . diltiazem (CARDIZEM) tablet 30 mg  30 mg Oral Q4H PRN Zierle-Ghosh, Asia B, DO      . guaiFENesin-dextromethorphan (ROBITUSSIN DM) 100-10 MG/5ML syrup 10 mL  10 mL Oral Q4H PRN Zierle-Ghosh, Asia B, DO   10 mL at 12/29/19 0240   . insulin aspart (novoLOG) injection 0-20 Units  0-20 Units Subcutaneous TID WC Elodia Florence., MD   3 Units at 12/30/19 1146  . insulin aspart (novoLOG) injection 0-5 Units  0-5 Units Subcutaneous QHS Elodia Florence., MD   3 Units at 12/28/19 2115  . insulin aspart (novoLOG) injection 14 Units  14 Units Subcutaneous TID WC Elodia Florence., MD   14 Units at 12/30/19 1146  . insulin glargine (LANTUS) injection 70 Units  70 Units Subcutaneous Daily Elodia Florence., MD   70 Units at 12/30/19 [phone]  . levothyroxine (SYNTHROID) tablet 50 mcg  50 mcg Oral Daily Elodia Florence., MD   50 mcg at 12/30/19 0510  . linagliptin (TRADJENTA) tablet 5 mg  5 mg Oral Daily Elodia Florence., MD   5 mg at 12/30/19 0920  . methylPREDNISolone sodium succinate (SOLU-MEDROL) 125 mg/2 mL injection 45 mg  45 mg Intravenous Q12H Elodia Florence., MD   45 mg at 12/30/19 0919  . metoprolol succinate (TOPROL-XL) 24 hr tablet 25 mg  25 mg Oral Daily Zierle-Ghosh, Asia B, DO   25 mg at 12/30/19 0920  . ondansetron (ZOFRAN) tablet 4 mg  4 mg Oral Q6H PRN Zierle-Ghosh, Asia B, DO   4 mg at 12/28/19 5102   Or  . ondansetron (ZOFRAN) injection 4 mg  4 mg Intravenous Q6H PRN Zierle-Ghosh, Asia B, DO      . oxyCODONE (Oxy IR/ROXICODONE) immediate release tablet 5 mg  5 mg Oral Q4H PRN Zierle-Ghosh, Asia B, DO      . polyethylene glycol (MIRALAX / GLYCOLAX) packet 17 g  17 g Oral Daily PRN Zierle-Ghosh, Asia B, DO   17 g at 12/27/19 0839  . sodium bicarbonate tablet 650 mg  650 mg Oral TID Elodia Florence., MD   650 mg at 12/30/19 0920  . traZODone (DESYREL) tablet 25 mg  25 mg Oral QHS PRN Zierle-Ghosh, Asia B, DO   25 mg at 12/27/19 2209     ALLERGIES Cortisone and Prednisone  MEDICAL HISTORY Past Medical History:  Diagnosis Date  . Allergic rhinitis   . Atrial fibrillation ([place])   . Chronic renal disease, stage IV ([place])   . Essential hypertension   . GERD  (gastroesophageal reflux disease)   . Gout   . Hyperlipemia, mixed   . Insomnia   . Type 2 diabetes mellitus ([place])      SOCIAL HISTORY Social History   Socioeconomic History  . Marital status: Married    Spouse name: Not on file  . Number of children: Not on file  . Years of education: Not on file  . Highest education level: Not on file  Occupational History  . Not on file  Tobacco Use  . Smoking status: Never Smoker  . Smokeless tobacco: Never Used  Substance and Sexual Activity  . Alcohol use: No    Alcohol/week: 0.0 standard drinks  . Drug use: No  . Sexual activity: Yes    Birth control/protection: Other-see comments  Other Topics Concern  . Not on file  Social History Narrative   Lives in [place].  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 Difficulty of Paying Living Expenses: Not on file  Food Insecurity:   . Worried About Charity fundraiser in the Last Year: Not on file  . Ran Out of Food in the Last Year: Not on file  Transportation Needs:   . Lack of Transportation (Medical): Not on file  . Lack of Transportation (Non-Medical): Not on file  Physical Activity:   . Days of Exercise per Week: Not on file  . Minutes of Exercise per Session: Not on file  Stress:   . Feeling of Stress : Not on file  Social Connections:   . Frequency of Communication with Friends and Family: Not on file  . Frequency of Social Gatherings with Friends and Family: Not on file  . Attends Religious Services: Not on file  . Active Member of Clubs or Organizations: Not on file  . Attends Archivist Meetings: Not on file  . Marital Status: Not on file  Intimate Partner Violence:   . Fear of Current or Ex-Partner: Not on file  . Emotionally Abused: Not on file  . Physically Abused: Not on file  . Sexually Abused: Not on file     FAMILY HISTORY Family History  Problem Relation Age of Onset   . Coronary artery disease Father 14  . Heart attack Father   . Congestive Heart Failure Mother   . Alzheimer's disease Mother       Review of Systems: 12 systems reviewed Otherwise as per HPI, all other systems reviewed and negative  Physical Exam: Vitals:   12/30/19 1152 12/30/19 1240  BP:  (!) 128/54  Pulse:  64  Resp:  20  Temp:  (!) 97.4 F (36.3 C)  SpO2: 90% (!) 88%   No intake/output data recorded. No intake or output data in the 24 hours ending 12/30/19 1241 General: well-appearing, no acute distress, sitting up in chair HEENT: anicteric sclera, oropharynx clear without lesions CV: regular rate Lungs: normal work of breathing, bl chest expansion, speaking in full sentences Abd: soft, non-tender, slightly distended Skin: no visible lesions or rashes Psych: alert, engaged, appropriate mood and affect Musculoskeletal: edema bl le's and ue's Neuro: normal speech, no gross focal deficits   Test Results Reviewed Lab Results  Component Value Date   NA 136 12/30/2019   K 4.5 12/30/2019   CL 106 12/30/2019   CO2 20 (L) 12/30/2019   BUN 93 (H) 12/30/2019   CREATININE 3.49 (H) 12/30/2019   GFR 22.97 (L) 05/25/2014   CALCIUM 7.4 (L) 12/30/2019   ALBUMIN 1.9 (L) 12/30/2019     I have reviewed all relevant outside healthcare records related to the patient's kidney injury.

## 2019-12-30 NOTE — Plan of Care (Signed)
  Problem: Education: Goal: Knowledge of risk factors and measures for prevention of condition will improve Outcome: Progressing   Problem: Coping: Goal: Psychosocial and spiritual needs will be supported Outcome: Progressing   Problem: Respiratory: Goal: Will maintain a patent airway Outcome: Progressing Goal: Complications related to the disease process, condition or treatment will be avoided or minimized Outcome: Progressing   

## 2019-12-30 NOTE — Progress Notes (Signed)
PROGRESS NOTE    Kathryn Richardson  YTK:[id number] DOB: [birth year] DOA: 01/05/2020 PCP: [name], MD   Chief Complaint  Patient presents with  . Covid positive    Brief Narrative:  Kathryn Richardson  is a 85 y.o. female, with history of type 2 diabetes mellitus, hyperlipidemia, GERD, hypertension, chronic renal disease stage IV, atrial fibrillation, and more presents to the ED with a chief complaint of difficulty breathing.  Patient reports symptoms started 9 days ago.  She had Covid test 8 days ago and it was positive.  She reports that she had monoclonal antibody infusion on 10 August.    However her symptoms continue to progress.  She presented to the emergency department.  She was noted to be hypoxic.  She was hospitalized for further management.    Assessment & Plan:  Acute hypoxic respiratory failure/pneumonia due to COVID-19  Patient was vaccinated back in February. This will be considered breakthrough infection.   She was treated with RegenCov on August 10. Patient presented with hypoxia. On admission patient was started on remdesivir and steroids.  Due to elevated procalcitonin patient was started empirically on antibacterials.  Patient has completed a 5-day course. Inflammatory markers are only minimally elevated.  Hold off on baricitinib due to use of antibacterial agents due to concern for secondary bacterial infection. For the past 2 to 3 days patient has been requiring 4 to 5 L of oxygen.  Chest x-ray was repeated this morning and showed stable findings.  Continue to mobilize, incentive spirometry, out of bed to chair.    COVID-19 Labs  Recent Labs    12/28/19 0412 12/30/19 0340  DDIMER 0.89*  --   FERRITIN 161  --   CRP 1.9* 2.7*    Acute kidney injury on chronic kidney disease stage IV/normal anion gap metabolic acidosis/hyponatremia Patient's baseline creatinine is around 1.7-2.2.  Came in with baseline renal function but has worsened since admission.  Patient was on  ACE inhibitor and was also given Lasix x1.  These agents were discontinued.   Patient was started on IV fluids. UA was reviewed. Urine sodium was less than 10. Patient urine output has picked up but she is now developing edema in her legs as well as arms.  Her albumin is noted to be 1.9.  Creatinine has actually plateaued.  Patient is followed by nephrology in [place].  Discussed with Dr. Candiss Norse with [facility] and who will consult on the patient.  He does recommend 1 dose of IV Lasix along with albumin.  This has been ordered.  We will stop IV fluids.   Previously done renal ultrasound did not show any hydronephrosis.  She was started on oral sodium bicarbonate for metabolic acidosis which is being continued.    Lactic acidosis This was mild.  Diabetes mellitus type 2, uncontrolled with hyperglycemia Poor glycemic control is likely due to steroids.  Patient is on Lantus SSI.  HbA1c 10.4.  CBGs are stable for the most part.  Continue current medication regimen.  History of paroxysmal atrial fibrillation Patient being continued on her Eliquis, diltiazem and also noted to be on metoprolol.  DVT prophylaxis: Eliquis Code Status: Full code Family Communication: Patient's daughter being updated daily. Disposition: Hopefully return home in improved  Status is: Inpatient  Remains inpatient appropriate because:IV treatments appropriate due to intensity of illness or inability to take PO and Inpatient level of care appropriate due to severity of illness   Dispo:  Patient From: Home  Planned Disposition:  Home  Expected discharge date: 12/31/19  Medically stable for discharge: No       Consultants:   none  Procedures:  Echo IMPRESSIONS    1. Left ventricular ejection fraction, by estimation, is 50 to 55%. The  left ventricle has low normal function. The left ventricle has no regional  wall motion abnormalities. There is mild concentric left ventricular  hypertrophy. Left  ventricular  diastolic function could not be evaluated. The average left ventricular  global longitudinal strain is -7.1 %.  2. Right ventricular systolic function is mildly reduced. The right  ventricular size is mildly enlarged. There is normal pulmonary artery  systolic pressure. The estimated right ventricular systolic pressure is  97.9 mmHg.  3. Left atrial size was mildly dilated.  4. The mitral valve is degenerative. Mild mitral valve regurgitation. No  evidence of mitral stenosis.  5. The aortic valve is tricuspid. Aortic valve regurgitation is not  visualized. No aortic stenosis is present.  6. The inferior vena cava is normal in size with greater than 50%  respiratory variability, suggesting right atrial pressure of 3 mmHg.   Comparison(s): Prior images unable to be directly viewed, comparison made  by report only. No significant change from prior study.    Antimicrobials:  Anti-infectives (From admission, onward)   Start     Dose/Rate Route Frequency Ordered Stop   12/24/19 1000  cefTRIAXone (ROCEPHIN) 2 g in sodium chloride 0.9 % 100 mL IVPB        2 g 200 mL/hr over 30 Minutes Intravenous Every 24 hours 12/24/19 0843 12/28/19 1001   12/24/19 1000  azithromycin (ZITHROMAX) 500 mg in sodium chloride 0.9 % 250 mL IVPB        500 mg 250 mL/hr over 60 Minutes Intravenous Every 24 hours 12/24/19 0843 12/28/19 1104   12/23/19 1000  remdesivir 100 mg in sodium chloride 0.9 % 100 mL IVPB       "Followed by" Linked Group Details   100 mg 200 mL/hr over 30 Minutes Intravenous Daily 01/09/2020 2317 12/26/19 0842   12/23/19 0001  remdesivir 100 mg in sodium chloride 0.9 % 100 mL IVPB       "Followed by" Linked Group Details   100 mg 200 mL/hr over 30 Minutes Intravenous  Once 12/27/2019 2317 12/23/19 0224   12/14/2019 2330  remdesivir 100 mg in sodium chloride 0.9 % 100 mL IVPB       "Followed by" Linked Group Details   100 mg 200 mL/hr over 30 Minutes Intravenous  Once 12/21/2019  2317 12/23/19 0052      Subjective: Patient overall states that she is feeling better compared to how she was when she came into the hospital.  Still gets very short of breath even with minimal exertion.  No chest pain.  No nausea vomiting.  She has noticed swelling in her arms and legs.  Objective: Vitals:   12/29/19 2018 12/30/19 0615 12/30/19 1152 12/30/19 1240  BP: (!) 148/73 124/76  (!) 128/54  Pulse: 70 62  64  Resp: 19 18  20   Temp: 98.1 F (36.7 C) 98.2 F (36.8 C)  (!) 97.4 F (36.3 C)  TempSrc: Oral   Oral  SpO2: (!) 88% 90% 90% (!) 88%  Weight:  97 kg    Height:       No intake or output data in the 24 hours ending 12/30/19 1315 Filed Weights   12/28/19 0453 12/29/19 0525 12/30/19 0615  Weight: 98.3 kg 102.6  kg 97 kg    Examination:  General appearance: Awake alert.  In no distress Resp: Mildly tachypneic at rest.  Improved effort.  Improved aeration.  Few crackles at the bases.  No wheezing or rhonchi.   Cardio: S1-S2 is normal regular.  No S3-S4.  No rubs murmurs or bruit GI: Abdomen is soft.  Nontender nondistended.  Bowel sounds are present normal.  No masses organomegaly Extremities: Edema is noted bilaterally arms as well as legs.  Able to move her extremities. Neurologic: Alert and oriented x3.  No focal neurological deficits.      Data Reviewed: I have personally reviewed following labs and imaging studies  CBC: Recent Labs  Lab 12/24/19 0502 12/25/19 0507 12/26/19 0448 12/27/19 0441 12/28/19 0412  WBC 14.9* 14.1* 14.3* 13.6* 13.2*  NEUTROABS 13.0* 12.8* 13.1* 12.4* 12.2*  HGB 14.6 13.5 13.2 13.1 12.7  HCT 42.4 41.0 38.5 38.5 37.5  MCV 92.6 97.4 93.4 93.7 92.8  PLT 267 257 300 318 762    Basic Metabolic Panel: Recent Labs  Lab 12/24/19 0502 12/24/19 1655 12/25/19 0832 12/25/19 0832 12/26/19 0448 12/26/19 0448 12/27/19 0441 12/27/19 1553 12/28/19 0412 12/29/19 0421 12/30/19 0340  NA 128*   < > 133*   < > 131*   < > 128* 132*  130* 132* 136  K 3.5   < > 4.0   < > 3.9   < > 4.4 3.9 4.0 4.0 4.5  CL 97*   < > 99   < > 100   < > 99 98 99 103 106  CO2 21*   < > 21*   < > 19*   < > 17* 18* 18* 19* 20*  GLUCOSE 163*   < > 272*   < > 242*   < > 252* 316* 327* 214* 104*  BUN 54*   < > 75*   < > 97*   < > 93* 116* 107* 106* 93*  CREATININE 1.91*   < > 2.25*   < > 3.13*   < > 3.60* 3.78* 3.56* 3.48* 3.49*  CALCIUM 7.3*   < > 7.5*   < > 7.5*   < > 7.2* 7.6* 7.4* 7.3* 7.4*  MG 2.5*  --  2.5*  --  2.5*  --  2.5*  --  2.6*  --   --    < > = values in this interval not displayed.    GFR: Estimated Creatinine Clearance: 13.5 mL/min (A) (by C-G formula based on SCr of 3.49 mg/dL (H)).  Liver Function Tests: Recent Labs  Lab 12/26/19 0448 12/27/19 0441 12/28/19 0412 12/29/19 0421 12/30/19 0340  AST 14* 22 16 16 16   ALT 17 16 18 19 18   ALKPHOS 38 47 62 58 51  BILITOT 0.5 0.8 0.3 0.4 0.5  PROT 4.9* 4.8* 4.7* 4.6* 4.8*  ALBUMIN 2.0* 2.0* 1.9* 1.9* 1.9*    CBG: Recent Labs  Lab 12/29/19 1151 12/29/19 1640 12/29/19 2014 12/30/19 0756 12/30/19 1132  GLUCAP 215* 197* 181* 105* 149*     Recent Results (from the past 240 hour(s))  SARS Coronavirus 2 by RT PCR (hospital order, performed in [facility] hospital lab) Nasopharyngeal Nasopharyngeal Swab     Status: Abnormal   Collection Time: 01/04/2020 10:07 PM   Specimen: Nasopharyngeal Swab  Result Value Ref Range Status   SARS Coronavirus 2 POSITIVE (A) NEGATIVE Final    Comment: RESULT CALLED TO, READ BACK BY AND VERIFIED WITH: [employer] ADKINS RN AT  2315 ON 12/19/2019 BY [email or web address] (NOTE) SARS-CoV-2 target nucleic acids are DETECTED  SARS-CoV-2 RNA is generally detectable in upper respiratory specimens  during the acute phase of infection.  Positive results are indicative  of the presence of the identified virus, but do not rule out bacterial infection or co-infection with other pathogens not detected by the test.  Clinical correlation with patient history and  other  diagnostic information is necessary to determine patient infection status.  The expected result is negative.  Fact Sheet for Patients:   StrictlyIdeas.no   Fact Sheet for Healthcare Providers:   BankingDealers.co.za    This test is not yet approved or cleared by the [place] FDA and  has been authorized for detection and/or diagnosis of SARS-CoV-2 by FDA under an Emergency Use Authorization (EUA).  This EUA will remain in effect (mea ning this test can be used) for the duration of  the COVID-19 declaration under Section 564(b)(1) of the Act, 21 U.S.C. section 360-bbb-3(b)(1), unless the authorization is terminated or revoked sooner.  Performed at [facility], [address] [place], [place] [address]   Blood Culture (routine x 2)     Status: None   Collection Time: 12/27/2019 10:07 PM   Specimen: BLOOD  Result Value Ref Range Status   Specimen Description   Final    BLOOD BLOOD RIGHT HAND Performed at [facility], [address] [place], [place] [address]    Special Requests   Final    BOTTLES DRAWN AEROBIC AND ANAEROBIC Blood Culture adequate volume Performed at [facility], [address] [place], [place] [address]    Culture   Final    NO GROWTH 5 DAYS Performed at [facility], [place] [address] [place], [place] [address]    Report Status 12/28/2019 FINAL  Final  Blood Culture (routine x 2)     Status: None   Collection Time: 12/14/2019 10:07 PM   Specimen: BLOOD  Result Value Ref Range Status   Specimen Description   Final    BLOOD LEFT ANTECUBITAL Performed at [facility], [address] [place], [place] [address]    Special Requests   Final    BOTTLES DRAWN AEROBIC AND ANAEROBIC Blood Culture adequate volume Performed at [facility], [address] [place], [place] [address]    Culture   Final    NO GROWTH 5 DAYS Performed at [place], [place] [address] [place], [place] [address]    Report Status 12/28/2019 FINAL  Final         Radiology Studies: DG Chest Port 1 View  Result Date: 12/30/2019 CLINICAL DATA:  Pneumonia.  COVID. EXAM: PORTABLE CHEST 1 VIEW COMPARISON:  12/28/2019. FINDINGS: Cardiac pacer with lead tip over the right atrium right ventricle. Cardiomegaly. No pulmonary venous congestion. Bilateral pulmonary infiltrates, right side greater than left again noted without interim change. No pleural effusion or pneumothorax. IMPRESSION: 1. Cardiac pacer with lead tips over the right atrium right ventricle. Cardiomegaly. No pulmonary venous congestion. 2. Bilateral pulmonary infiltrates, right side greater than left again noted without interim change. Electronically Signed   By: Faxon   On: 12/30/2019 06:17     Scheduled Meds: . apixaban  2.5 mg Oral BID  . atorvastatin  80 mg Oral QHS  . diltiazem  360 mg Oral Daily  . insulin aspart  0-20 Units Subcutaneous TID WC  .  insulin aspart  0-5 Units Subcutaneous QHS  . insulin aspart  14 Units Subcutaneous TID WC  . insulin glargine  70 Units Subcutaneous Daily  . levothyroxine  50 mcg Oral Daily  . linagliptin  5 mg Oral Daily  . methylPREDNISolone (SOLU-MEDROL) injection  45 mg Intravenous Q12H  . metoprolol succinate  25 mg Oral Daily  . sodium bicarbonate  650 mg Oral TID   Continuous Infusions: . sodium chloride 10 mL/hr at 12/20/2019 2356     LOS: 7 days   Bonnielee Haff, MD [facility]   To contact the attending provider between 7A-7P or the covering provider during after hours 7P-7A, please log into the web site [email or web address] and access using universal [place] password for that web site. If you do not have the password, please call the hospital operator.  12/30/2019, 1:15 PM

## 2019-12-31 LAB — COMPREHENSIVE METABOLIC PANEL
ALT: 22 U/L (ref 0–44)
AST: 23 U/L (ref 15–41)
Albumin: 2.4 g/dL — ABNORMAL LOW (ref 3.5–5.0)
Alkaline Phosphatase: 52 U/L (ref 38–126)
Anion gap: 12 (ref 5–15)
BUN: 114 mg/dL — ABNORMAL HIGH (ref 8–23)
CO2: 19 mmol/L — ABNORMAL LOW (ref 22–32)
Calcium: 7.9 mg/dL — ABNORMAL LOW (ref 8.9–10.3)
Chloride: 107 mmol/L (ref 98–111)
Creatinine, Ser: 3.22 mg/dL — ABNORMAL HIGH (ref 0.44–1.00)
GFR calc Af Amer: 15 mL/min — ABNORMAL LOW (ref >60)
GFR calc non Af Amer: 13 mL/min — ABNORMAL LOW (ref >60)
Glucose, Bld: 88 mg/dL (ref 70–99)
Potassium: 4.8 mmol/L (ref 3.5–5.1)
Sodium: 138 mmol/L (ref 135–145)
Total Bilirubin: 0.4 mg/dL (ref 0.3–1.2)
Total Protein: 5.2 g/dL — ABNORMAL LOW (ref 6.5–8.1)

## 2019-12-31 LAB — GLUCOSE, CAPILLARY
Glucose-Capillary: 70 mg/dL (ref 70–99)
Glucose-Capillary: 144 mg/dL — ABNORMAL HIGH (ref 70–99)
Glucose-Capillary: 146 mg/dL — ABNORMAL HIGH (ref 70–99)
Glucose-Capillary: 238 mg/dL — ABNORMAL HIGH (ref 70–99)

## 2019-12-31 MED ORDER — ENSURE ENLIVE PO LIQD
237.0000 mL | Freq: Three times a day (TID) | ORAL | Status: DC
  Administered 2019-12-31 – 2020-01-07 (×11): 237 mL via ORAL

## 2019-12-31 MED ORDER — ALBUMIN HUMAN 25 % IV SOLN
25.0000 g | Freq: Once | INTRAVENOUS | Status: AC
  Administered 2019-12-31: 25 g via INTRAVENOUS
  Filled 2019-12-31: qty 100

## 2019-12-31 MED ORDER — PREDNISONE 20 MG PO TABS
40.0000 mg | ORAL_TABLET | Freq: Every day | ORAL | Status: DC
  Administered 2020-01-01 – 2020-01-05 (×5): 40 mg via ORAL
  Filled 2019-12-31 (×5): qty 2

## 2019-12-31 MED ORDER — BENZONATATE 100 MG PO CAPS
100.0000 mg | ORAL_CAPSULE | Freq: Three times a day (TID) | ORAL | Status: DC
  Administered 2019-12-31 – 2020-01-08 (×21): 100 mg via ORAL
  Filled 2019-12-31 (×22): qty 1

## 2019-12-31 NOTE — Progress Notes (Signed)
PROGRESS NOTE    Kathryn Richardson  STM:[id number] DOB: [birth year] DOA: 01/04/2020 PCP: [name], MD   Chief Complaint  Patient presents with  . Covid positive    Brief Narrative:  Kathryn Richardson  is a 85 y.o. female, with history of type 2 diabetes mellitus, hyperlipidemia, GERD, hypertension, chronic renal disease stage IV, atrial fibrillation, and more presents to the ED with a chief complaint of difficulty breathing.  Patient reports symptoms started 9 days ago.  She had Covid test 8 days ago and it was positive.  She reports that she had monoclonal antibody infusion on 10 August.    However her symptoms continue to progress.  She presented to the emergency department.  She was noted to be hypoxic.  She was hospitalized for further management.    Assessment & Plan:  Acute hypoxic respiratory failure/pneumonia due to COVID-19  Patient was vaccinated back in February. This will be considered breakthrough infection.   She was treated with RegenCov on August 10. Patient presented with hypoxia. On admission patient was started on remdesivir and steroids.  Due to elevated procalcitonin patient was started empirically on antibacterials.  Patient has completed a 5-day course. CRP was noted to be elevated and improved.  D-dimer was never significantly elevated. Was not given Actemra or baricitinib due to concern for secondary bacterial infection. Patient's oxygen requirement has plateaued.  She has been requiring 4 to 5 L maintaining sats in the late 80s to early 90s.  Chest x-ray was repeated yesterday and showed stable findings.  Continue incentive spirometry mobilization.   Patient was told that she may require this much amount of oxygen for the foreseeable future.    Start cutting back on her steroids.  COVID-19 Labs  Recent Labs    12/30/19 0340  CRP 2.7*    Acute kidney injury on chronic kidney disease stage IV/normal anion gap metabolic acidosis/hyponatremia Patient's baseline  creatinine is around 1.7-2.2.  Came in with baseline renal function but has worsened since admission.  Patient was on ACE inhibitor and was also given Lasix x1.  These agents were discontinued.   Patient was started on IV fluids. UA was reviewed. Urine sodium was less than 10. Patient urine output did pick up but her creatinine remained elevated.  She also developed edema in her upper and lower extremities.  Albumin is noted to be low. Nephrology was consulted.  She was given 1 dose of Lasix with albumin.  Creatinine is stable.  This may be her new baseline.  Renal dysfunction most likely due to multiple acute issues including COVID-19.  Further management per nephrology. Previously done renal ultrasound did not show any hydronephrosis.  She was started on oral sodium bicarbonate for metabolic acidosis which is being continued.    Lactic acidosis This was mild.  Diabetes mellitus type 2, uncontrolled with hyperglycemia Poor glycemic control is likely due to steroids.  Patient is on Lantus SSI.  HbA1c 10.4.  CBGs are stable for the most part.  Continue current medication regimen.  History of paroxysmal atrial fibrillation Patient being continued on her Eliquis, diltiazem and also noted to be on metoprolol.  DVT prophylaxis: Eliquis Code Status: Full code Family Communication: Patient's daughter being updated daily. Disposition: Hopefully return home in improved.  Home health recommended by PT.  Status is: Inpatient  Remains inpatient appropriate because:IV treatments appropriate due to intensity of illness or inability to take PO and Inpatient level of care appropriate due to severity of illness  Dispo:  Patient From: Home  Planned Disposition: To be determined  Expected discharge date: 01/02/20  Medically stable for discharge: No     Consultants:   Nephrology  Procedures:  [place] IMPRESSIONS    1. Left ventricular ejection fraction, by estimation, is 50 to 55%. The  left  ventricle has low normal function. The left ventricle has no regional  wall motion abnormalities. There is mild concentric left ventricular  hypertrophy. Left ventricular  diastolic function could not be evaluated. The average left ventricular  global longitudinal strain is -7.1 %.  2. Right ventricular systolic function is mildly reduced. The right  ventricular size is mildly enlarged. There is normal pulmonary artery  systolic pressure. The estimated right ventricular systolic pressure is  78.2 mmHg.  3. Left atrial size was mildly dilated.  4. The mitral valve is degenerative. Mild mitral valve regurgitation. No  evidence of mitral stenosis.  5. The aortic valve is tricuspid. Aortic valve regurgitation is not  visualized. No aortic stenosis is present.  6. The inferior vena cava is normal in size with greater than 50%  respiratory variability, suggesting right atrial pressure of 3 mmHg.   Comparison(s): Prior images unable to be directly viewed, comparison made  by report only. No significant change from prior study.    Antimicrobials:  Anti-infectives (From admission, onward)   Start     Dose/Rate Route Frequency Ordered Stop   12/24/19 1000  cefTRIAXone (ROCEPHIN) 2 g in sodium chloride 0.9 % 100 mL IVPB        2 g 200 mL/hr over 30 Minutes Intravenous Every 24 hours 12/24/19 0843 12/28/19 1001   12/24/19 1000  azithromycin (ZITHROMAX) 500 mg in sodium chloride 0.9 % 250 mL IVPB        500 mg 250 mL/hr over 60 Minutes Intravenous Every 24 hours 12/24/19 0843 12/28/19 1104   12/23/19 1000  remdesivir 100 mg in sodium chloride 0.9 % 100 mL IVPB       "Followed by" Linked Group Details   100 mg 200 mL/hr over 30 Minutes Intravenous Daily 12/15/2019 2317 12/26/19 0842   12/23/19 0001  remdesivir 100 mg in sodium chloride 0.9 % 100 mL IVPB       "Followed by" Linked Group Details   100 mg 200 mL/hr over 30 Minutes Intravenous  Once 01/03/2020 2317 12/23/19 0224   12/20/2019 2330   remdesivir 100 mg in sodium chloride 0.9 % 100 mL IVPB       "Followed by" Linked Group Details   100 mg 200 mL/hr over 30 Minutes Intravenous  Once 01/07/2020 2317 12/23/19 0052      Subjective: Patient states that she is feeling about the same as yesterday.  Denies any worsening in her shortness of breath.  Continues to have a cough which is dry.  Requesting another cough medicine.  Objective: Vitals:   12/30/19 1553 12/30/19 2022 12/31/19 0544 12/31/19 0727  BP:  (!) 142/64 (!) 138/94   Pulse:  70 67   Resp:  18    Temp:  (!) 97.5 F (36.4 C) 97.8 F (36.6 C)   TempSrc:  Oral Oral   SpO2: 91% 90% (!) 87% (!) 88%  Weight:   105.8 kg   Height:        Intake/Output Summary (Last 24 hours) at 12/31/2019 1122 Last data filed at 12/31/2019 0552 Gross per 24 hour  Intake --  Output 1650 ml  Net -1650 ml   [employer]  12/29/19 0525 12/30/19 0615 12/31/19 0544  Weight: 102.6 kg 97 kg 105.8 kg    Examination:  General appearance: Awake alert.  In no distress Resp: Mildly tachypneic without any use of accessory muscles.  Improved aeration.  Few crackles at the bases.  No wheezing or rhonchi.   Cardio: S1-S2 is normal regular.  No S3-S4.  No rubs murmurs or bruit GI: Abdomen is soft.  Nontender nondistended.  Bowel sounds are present normal.  No masses organomegaly Extremities: Mild edema noted bilateral lower and upper extremities. Neurologic: Alert and oriented x3.  No focal neurological deficits.     Data Reviewed: I have personally reviewed following labs and imaging studies  CBC: Recent Labs  Lab 12/25/19 0507 12/26/19 0448 12/27/19 0441 12/28/19 0412  WBC 14.1* 14.3* 13.6* 13.2*  NEUTROABS 12.8* 13.1* 12.4* 12.2*  HGB 13.5 13.2 13.1 12.7  HCT 41.0 38.5 38.5 37.5  MCV 97.4 93.4 93.7 92.8  PLT 257 300 318 703    Basic Metabolic Panel: Recent Labs  Lab 12/25/19 0832 12/25/19 0832 12/26/19 0448 12/26/19 0448 12/27/19 0441 12/27/19 0441 12/27/19 1553  12/28/19 0412 12/29/19 0421 12/30/19 0340 12/31/19 0436  NA 133*   < > 131*   < > 128*   < > 132* 130* 132* 136 138  K 4.0   < > 3.9   < > 4.4   < > 3.9 4.0 4.0 4.5 4.8  CL 99   < > 100   < > 99   < > 98 99 103 106 107  CO2 21*   < > 19*   < > 17*   < > 18* 18* 19* 20* 19*  GLUCOSE 272*   < > 242*   < > 252*   < > 316* 327* 214* 104* 88  BUN 75*   < > 97*   < > 93*   < > 116* 107* 106* 93* 114*  CREATININE 2.25*   < > 3.13*   < > 3.60*   < > 3.78* 3.56* 3.48* 3.49* 3.22*  CALCIUM 7.5*   < > 7.5*   < > 7.2*   < > 7.6* 7.4* 7.3* 7.4* 7.9*  MG 2.5*  --  2.5*  --  2.5*  --   --  2.6*  --   --   --    < > = values in this interval not displayed.    GFR: Estimated Creatinine Clearance: 15.4 mL/min (A) (by C-G formula based on SCr of 3.22 mg/dL (H)).  Liver Function Tests: Recent Labs  Lab 12/27/19 0441 12/28/19 0412 12/29/19 0421 12/30/19 0340 12/31/19 0436  AST 22 16 16 16 23   ALT 16 18 19 18 22   ALKPHOS 47 62 58 51 52  BILITOT 0.8 0.3 0.4 0.5 0.4  PROT 4.8* 4.7* 4.6* 4.8* 5.2*  ALBUMIN 2.0* 1.9* 1.9* 1.9* 2.4*    CBG: Recent Labs  Lab 12/30/19 0756 12/30/19 1132 12/30/19 1621 12/30/19 2015 12/31/19 0814  GLUCAP 105* 149* 133* 143* 70     Recent Results (from the past 240 hour(s))  SARS Coronavirus 2 by RT PCR (hospital order, performed in [facility] hospital lab) Nasopharyngeal Nasopharyngeal Swab     Status: Abnormal   Collection Time: 01/07/2020 10:07 PM   Specimen: Nasopharyngeal Swab  Result Value Ref Range Status   SARS Coronavirus 2 POSITIVE (A) NEGATIVE Final    Comment: RESULT CALLED TO, READ BACK BY AND VERIFIED WITH: LISA ADKINS RN AT 2315 ON  01/10/2020 BY [email or web address] (NOTE) SARS-CoV-2 target nucleic acids are DETECTED  SARS-CoV-2 RNA is generally detectable in upper respiratory specimens  during the acute phase of infection.  Positive results are indicative  of the presence of the identified virus, but do not rule out bacterial infection or co-infection  with other pathogens not detected by the test.  Clinical correlation with patient history and  other diagnostic information is necessary to determine patient infection status.  The expected result is negative.  Fact Sheet for Patients:   StrictlyIdeas.no   Fact Sheet for Healthcare Providers:   BankingDealers.co.za    This test is not yet approved or cleared by the [place] FDA and  has been authorized for detection and/or diagnosis of SARS-CoV-2 by FDA under an Emergency Use Authorization (EUA).  This EUA will remain in effect (mea ning this test can be used) for the duration of  the COVID-19 declaration under Section 564(b)(1) of the Act, 21 U.S.C. section 360-bbb-3(b)(1), unless the authorization is terminated or revoked sooner.  Performed at [facility], [address] [place], [place] [address]   Blood Culture (routine x 2)     Status: None   Collection Time: 12/18/2019 10:07 PM   Specimen: BLOOD  Result Value Ref Range Status   Specimen Description   Final    BLOOD BLOOD RIGHT HAND Performed at [facility], [address] [place], [place] [address]    Special Requests   Final    BOTTLES DRAWN AEROBIC AND ANAEROBIC Blood Culture adequate volume Performed at [facility], [address] [place], [place] [address]    Culture   Final    NO GROWTH 5 DAYS Performed at [facility], [place] [address] [place], [place] [address]    Report Status 12/28/2019 FINAL  Final  Blood Culture (routine x 2)     Status: None   Collection Time: 01/07/2020 10:07 PM   Specimen: BLOOD  Result Value Ref Range Status   Specimen Description   Final    BLOOD LEFT ANTECUBITAL Performed at [facility], [address] [place], [place] [address]    Special Requests   Final    BOTTLES DRAWN AEROBIC AND ANAEROBIC Blood Culture adequate volume Performed at [facility], [address] [place], [place] [address]    Culture   Final    NO GROWTH 5 DAYS Performed at [facility], [place] [address] [place],  [address]    Report Status 12/28/2019 FINAL  Final         Radiology Studies: DG Chest Port 1 View  Result Date: 12/30/2019 CLINICAL DATA:  Pneumonia.  COVID. EXAM: PORTABLE CHEST 1 VIEW COMPARISON:  12/28/2019. FINDINGS: Cardiac pacer with lead tip over the right atrium right ventricle. Cardiomegaly. No pulmonary venous congestion. Bilateral pulmonary infiltrates, right side greater than left again noted without interim change. No pleural effusion or pneumothorax. IMPRESSION: 1. Cardiac pacer with lead tips over the right atrium right ventricle. Cardiomegaly. No pulmonary venous congestion. 2. Bilateral pulmonary infiltrates, right side greater than left again noted without interim change. Electronically Signed   By: [place]   On: 12/30/2019 06:17     Scheduled Meds: . apixaban  2.5 mg Oral BID  . atorvastatin  80 mg Oral QHS  . benzonatate  100 mg Oral TID  . diltiazem  360 mg Oral Daily  . insulin aspart  0-20 Units Subcutaneous TID WC  . insulin aspart  0-5 Units Subcutaneous QHS  . insulin aspart  14 Units Subcutaneous TID WC  . insulin glargine  70 Units Subcutaneous Daily  . levothyroxine  50 mcg Oral Daily  . linagliptin  5 mg Oral Daily  . methylPREDNISolone (SOLU-MEDROL) injection  45 mg Intravenous Q12H  . metoprolol succinate  25 mg Oral Daily  . sodium bicarbonate  650 mg Oral TID   Continuous Infusions: . sodium chloride 10 mL/hr at 12/31/19 0236     LOS: 8 days   Bonnielee Haff, MD [facility]   To contact the attending provider between 7A-7P or the covering provider during after hours 7P-7A, please log into the web site [email or web address] and access using universal [place] password for that web site. If you do not have the password, please call the hospital operator.  12/31/2019, 11:22 AM

## 2019-12-31 NOTE — Progress Notes (Signed)
[facility] Progress Note    Assessment/ Plan:   Non-Oliguric AKI (stable) on CKD 4: Likely secondary to COVID, may very well be intravascularly depleted (hyaline casts present on previous UA) -baseline Cr around 1.8-2.2. Follows with Dr. Kern Alberta at [facility]. Underlying CKD secondary to longstanding DM and poorly controlled HTN. -moving forward would pursue spot diuresis. Will trial albumin infusion only x 1 dose today -no obstruction on renal ultrasound: cortical thinning and probable medical renal disease of both kidneys -Continue to monitor daily Cr, Dose meds for GFR<15 -Monitor Daily I/Os, Daily weight  -May obtain urine sample if able -Maintain MAP>65 for optimal renal perfusion.  -Agree with holding ACE-I, avoid further nephrotoxins including NSAIDS, Morphine.  Unless absolutely necessary, avoid CT with contrast and/or MRI with gadolinium.     Azotemia -likely from her being intravascularly dry, will trial albumin infusion x1 dose today.  Not exhibiting any uremic symptoms  COVID-19 Pneumonia/AHRF -remdesivir, steroids. Completed empiric abx -mgmt per primary service. On supplemental O2  Anasarca -likely third spacing from hypoalbuminemia -recommend adding protein supplements -moving forward, would do spot diuresis. Trial lasix with albumin yesterday.  Hypoalbuminemia  -alb 2.4 (reflective of albumin given yesterday). recommend starting protein supplements I.e. ensure, boost, etc  Non-gap Metabolic Acidosis -corrected anion gap 15 -likely related to AKI -agree with nahco3  Hypertension: -continue with toprol xl and cardizem  Afib -renally dose eliquis -rate control per primary service  Uncontrolled Diabetes Mellitus Type 2 with Hyperglycemia -mgmt per primary service  Gean Quint, MD [place] 12/31/2019, 1:42 PM   Subjective:   Feels about the same, no changes. Patient reports that the breathing is the same and that the  swelling does not bother her.  Denies any fevers, chest pain, worsening shortness of breath, nausea/vomiting, dysgeusia, loss of appetite, episodes of confusion.  Eager to go home.   Objective:   BP 136/73 (BP Location: Left Arm)   Pulse 70   Temp 97.6 F (36.4 C) (Oral)   Resp 20   Ht 5\' 3"  (1.6 m)   Wt 105.8 kg   SpO2 91%   BMI 41.32 kg/m   Intake/Output Summary (Last 24 hours) at 12/31/2019 1342 Last data filed at 12/31/2019 2951 Gross per 24 hour  Intake 120 ml  Output 1650 ml  Net -1530 ml   Weight change: 8.8 kg  Physical Exam: Gen:nad, comfortable CVS: Regular rate Resp: Normal work of breathing, bilateral chest expansion Abd: Nondistended Ext: Pitting edema in all extremities Neuro: No asterixis, speech clear and coherent, moves all extremities spontaneously  Imaging: DG Chest Port 1 View  Result Date: 12/30/2019 CLINICAL DATA:  Pneumonia.  COVID. EXAM: PORTABLE CHEST 1 VIEW COMPARISON:  12/28/2019. FINDINGS: Cardiac pacer with lead tip over the right atrium right ventricle. Cardiomegaly. No pulmonary venous congestion. Bilateral pulmonary infiltrates, right side greater than left again noted without interim change. No pleural effusion or pneumothorax. IMPRESSION: 1. Cardiac pacer with lead tips over the right atrium right ventricle. Cardiomegaly. No pulmonary venous congestion. 2. Bilateral pulmonary infiltrates, right side greater than left again noted without interim change. Electronically Signed   By: [place]   On: 12/30/2019 06:17    Labs: BMET Recent Labs  Lab 12/26/19 0448 12/27/19 0441 12/27/19 1553 12/28/19 0412 12/29/19 0421 12/30/19 0340 12/31/19 0436  NA 131* 128* 132* 130* 132* 136 138  K 3.9 4.4 3.9 4.0 4.0 4.5 4.8  CL 100 99 98 99 103 106 107  CO2 19* 17* 18*  18* 19* 20* 19*  GLUCOSE 242* 252* 316* 327* 214* 104* 88  BUN 97* 93* 116* 107* 106* 93* 114*  CREATININE 3.13* 3.60* 3.78* 3.56* 3.48* 3.49* 3.22*  CALCIUM 7.5* 7.2* 7.6*  7.4* 7.3* 7.4* 7.9*   CBC Recent Labs  Lab 12/25/19 0507 12/26/19 0448 12/27/19 0441 12/28/19 0412  WBC 14.1* 14.3* 13.6* 13.2*  NEUTROABS 12.8* 13.1* 12.4* 12.2*  HGB 13.5 13.2 13.1 12.7  HCT 41.0 38.5 38.5 37.5  MCV 97.4 93.4 93.7 92.8  PLT 257 300 318 318    Medications:    . apixaban  2.5 mg Oral BID  . atorvastatin  80 mg Oral QHS  . benzonatate  100 mg Oral TID  . diltiazem  360 mg Oral Daily  . insulin aspart  0-20 Units Subcutaneous TID WC  . insulin aspart  0-5 Units Subcutaneous QHS  . insulin aspart  14 Units Subcutaneous TID WC  . insulin glargine  70 Units Subcutaneous Daily  . levothyroxine  50 mcg Oral Daily  . linagliptin  5 mg Oral Daily  . metoprolol succinate  25 mg Oral Daily  . [START ON 01/01/2020] predniSONE  40 mg Oral Q breakfast  . sodium bicarbonate  650 mg Oral TID

## 2020-01-01 ENCOUNTER — Inpatient Hospital Stay (HOSPITAL_COMMUNITY): Payer: Medicare Other

## 2020-01-01 LAB — GLUCOSE, CAPILLARY
Glucose-Capillary: 158 mg/dL — ABNORMAL HIGH (ref 70–99)
Glucose-Capillary: 246 mg/dL — ABNORMAL HIGH (ref 70–99)
Glucose-Capillary: 339 mg/dL — ABNORMAL HIGH (ref 70–99)
Glucose-Capillary: 295 mg/dL — ABNORMAL HIGH (ref 70–99)

## 2020-01-01 LAB — BASIC METABOLIC PANEL
Anion gap: 12 (ref 5–15)
BUN: 114 mg/dL — ABNORMAL HIGH (ref 8–23)
CO2: 22 mmol/L (ref 22–32)
Calcium: 8.9 mg/dL (ref 8.9–10.3)
Chloride: 104 mmol/L (ref 98–111)
Creatinine, Ser: 3.03 mg/dL — ABNORMAL HIGH (ref 0.44–1.00)
GFR calc Af Amer: 16 mL/min — ABNORMAL LOW (ref >60)
GFR calc non Af Amer: 14 mL/min — ABNORMAL LOW (ref >60)
Glucose, Bld: 195 mg/dL — ABNORMAL HIGH (ref 70–99)
Potassium: 5 mmol/L (ref 3.5–5.1)
Sodium: 138 mmol/L (ref 135–145)

## 2020-01-01 MED ORDER — SENNA 8.6 MG PO TABS
2.0000 | ORAL_TABLET | Freq: Two times a day (BID) | ORAL | Status: DC
  Administered 2020-01-01 – 2020-01-04 (×4): 17.2 mg via ORAL
  Filled 2020-01-01 (×6): qty 2

## 2020-01-01 MED ORDER — ALBUMIN HUMAN 25 % IV SOLN
25.0000 g | Freq: Once | INTRAVENOUS | Status: AC
  Administered 2020-01-01: 25 g via INTRAVENOUS
  Filled 2020-01-01: qty 100

## 2020-01-01 MED ORDER — POLYETHYLENE GLYCOL 3350 17 G PO PACK
17.0000 g | PACK | Freq: Two times a day (BID) | ORAL | Status: DC
  Administered 2020-01-01 – 2020-01-04 (×4): 17 g via ORAL
  Filled 2020-01-01 (×6): qty 1

## 2020-01-01 MED ORDER — FUROSEMIDE 10 MG/ML IJ SOLN
80.0000 mg | Freq: Once | INTRAMUSCULAR | Status: AC
  Administered 2020-01-01: 80 mg via INTRAVENOUS
  Filled 2020-01-01: qty 8

## 2020-01-01 NOTE — Progress Notes (Signed)
[facility] Progress Note    Assessment/ Plan:   Non-Oliguric AKI (stable) on CKD 4: Likely secondary to COVID, may very well be intravascularly depleted (hyaline casts present on previous UA) -baseline Cr around 1.8-2.2. Follows with Dr. Kern Alberta at [facility]. Underlying CKD secondary to longstanding DM and poorly controlled HTN. -moving forward would pursue spot diuresis -no obstruction on renal ultrasound: cortical thinning and probable medical renal disease of both kidneys -No indication for renal replacement therapy at this junction -Continue to monitor daily Cr, Dose meds for GFR<15 -Monitor Daily I/Os, Daily weight  -May obtain urine sample if able -Maintain MAP>65 for optimal renal perfusion.  -Agree with holding ACE-I, avoid further nephrotoxins including NSAIDS, Morphine.  Unless absolutely necessary, avoid CT with contrast and/or MRI with gadolinium.     Azotemia -likely from her being intravascularly dry and the fact that she is on catabolic state.  Still not exhibiting any uremic symptoms  COVID-19 Pneumonia/AHRF -worsening sob today, cxr? Will defer to primary. -If his CXR is obtained and if there is evidence of new/worsening pulmonary edema or vascular congestion then would recommend a one-time Lasix dose of 80 mg IV -remdesivir, steroids. Completed empiric abx -mgmt per primary service. On supplemental O2  Anasarca -Slightly better today -likely third spacing from hypoalbuminemia -recommend adding protein supplements -moving forward, would do spot diuresis. Trial lasix with albumin yesterday.  Hypoalbuminemia  -alb 2.4 yesterday (reflective of albumin given today prior). recommend starting protein supplements I.e. ensure, boost, etc which she has  Non-gap Metabolic Acidosis, improved -corrected anion gap 15 -likely related to AKI -agree with nahco3  Hypertension: -continue with toprol xl and cardizem  Afib -renally dose eliquis -rate  control per primary service  Uncontrolled Diabetes Mellitus Type 2 with Hyperglycemia -mgmt per primary service  Recommendations conveyed to primary service.  Gean Quint, MD [facility]  Subjective:   Has slowly worsening shortness of breath today with increased O2 requirements.  She feels like her swelling is improved and she is peeing a lot.  Denies any nausea/vomiting, loss of appetite, dysgeusia, tremors, episodes of confusion.   Objective:   BP (!) 160/83 (BP Location: Left Arm)   Pulse 73   Temp (!) 97.4 F (36.3 C) (Oral)   Resp 18   Ht 5\' 3"  (1.6 m)   Wt 105 kg   SpO2 (!) 88%   BMI 41.01 kg/m   Intake/Output Summary (Last 24 hours) at 01/01/2020 1430 Last data filed at 01/01/2020 1100 Gross per 24 hour  Intake 340 ml  Output 800 ml  Net -460 ml   Weight change: -0.8 kg  Physical Exam: Gen:nad, sitting up in bed CVS: Regular rate Resp: increased work of breathing, bilateral chest expansion, on [place] Abd: Nondistended Ext: Pitting edema in all extremities (improved) Neuro: No asterixis, speech clear and coherent, moves all extremities spontaneously  Imaging: No results found.  Labs: BMET Recent Labs  Lab 12/27/19 0441 12/27/19 1553 12/28/19 0412 12/29/19 0421 12/30/19 0340 12/31/19 0436 01/01/20 0628  NA 128* 132* 130* 132* 136 138 138  K 4.4 3.9 4.0 4.0 4.5 4.8 5.0  CL 99 98 99 103 106 107 104  CO2 17* 18* 18* 19* 20* 19* 22  GLUCOSE 252* 316* 327* 214* 104* 88 195*  BUN 93* 116* 107* 106* 93* 114* 114*  CREATININE 3.60* 3.78* 3.56* 3.48* 3.49* 3.22* 3.03*  CALCIUM 7.2* 7.6* 7.4* 7.3* 7.4* 7.9* 8.9   CBC Recent Labs  Lab 12/26/19 0448 12/27/19 0441  12/28/19 0412  WBC 14.3* 13.6* 13.2*  NEUTROABS 13.1* 12.4* 12.2*  HGB 13.2 13.1 12.7  HCT 38.5 38.5 37.5  MCV 93.4 93.7 92.8  PLT 300 318 318    Medications:    . apixaban  2.5 mg Oral BID  . atorvastatin  80 mg Oral QHS  . benzonatate  100 mg Oral TID  . diltiazem  360  mg Oral Daily  . feeding supplement (ENSURE ENLIVE)  237 mL Oral TID BM  . insulin aspart  0-20 Units Subcutaneous TID WC  . insulin aspart  0-5 Units Subcutaneous QHS  . insulin aspart  14 Units Subcutaneous TID WC  . insulin glargine  70 Units Subcutaneous Daily  . levothyroxine  50 mcg Oral Daily  . linagliptin  5 mg Oral Daily  . metoprolol succinate  25 mg Oral Daily  . polyethylene glycol  17 g Oral BID  . predniSONE  40 mg Oral Q breakfast  . senna  2 tablet Oral BID  . sodium bicarbonate  650 mg Oral TID

## 2020-01-01 NOTE — Progress Notes (Addendum)
Patient's oxygen sat 83-87 on 5L oxygen, patient denies any resp distress, in bed resting. Dr. [place] Pink notified CXR ordered, and patient's oxygen increased to 6L, will continue to assess patient.

## 2020-01-01 NOTE — Progress Notes (Signed)
PROGRESS NOTE    Kathryn Richardson  VOJ:[id number] DOB: 21-Jul-1934 DOA: 12/24/2019 PCP: [name], MD   Chief Complaint  Patient presents with  . Covid positive    Brief Narrative:  Kathryn Richardson  is a 85 y.o. female, with history of type 2 diabetes mellitus, hyperlipidemia, GERD, hypertension, chronic renal disease stage IV, atrial fibrillation, and more presents to the ED with a chief complaint of difficulty breathing.  Patient reports symptoms started 9 days ago.  She had Covid test 8 days ago and it was positive.  She reports that she had monoclonal antibody infusion on 10 August.    However her symptoms continue to progress.  She presented to the emergency department.  She was noted to be hypoxic.  She was hospitalized for further management.    Assessment & Plan:  Acute hypoxic respiratory failure/pneumonia due to COVID-19  Patient was vaccinated back in February. This will be considered breakthrough infection.   She was treated with RegenCov on August 10. Patient presented with hypoxia. On admission patient was started on remdesivir and steroids.  Due to elevated procalcitonin patient was given a 5-day course of antibacterials.   CRP was noted to be elevated and improved.  D-dimer was never significantly elevated. Was not given Actemra or baricitinib due to concern for secondary bacterial infection. Patient's oxygen requirement has plateaued.  Chest x-ray repeated recently showed stable findings.  She is requiring about 4 to 5 L of oxygen with saturations in the late 80s to 90s.  Continue mobilization incentive spirometry.   Patient was told that she may require this much amount of oxygen for the foreseeable future.    Changed over to oral steroids.  Acute kidney injury on chronic kidney disease stage IV/normal anion gap metabolic acidosis/hyponatremia Patient's baseline creatinine is around 1.7-2.2.  Came in with baseline renal function but has worsened since admission.  Patient was  on ACE inhibitor and was also given Lasix x1.  These agents were discontinued.   Patient was started on IV fluids. UA was reviewed. Urine sodium was less than 10. Patient urine output did pick up but her creatinine remained elevated.  She also developed edema in her upper and lower extremities.  Albumin is noted to be low. Nephrology was consulted.  She was given 1 dose of Lasix with albumin.  She was given another dose of albumin yesterday.  Creatinine seems to be slowly getting better.  BUN remains elevated.  No uremic symptoms noted.  Continue to monitor urine output.   Renal dysfunction most likely due to multiple acute issues including COVID-19.  Further management per nephrology. Previously done renal ultrasound did not show any hydronephrosis.  She was started on oral sodium bicarbonate for metabolic acidosis which is being continued.    Diabetes mellitus type 2, uncontrolled with hyperglycemia Poor glycemic control is likely due to steroids.  Patient is on Lantus SSI.  HbA1c 10.4.  CBGs are stable for the most part.  Continue current medication regimen.  History of paroxysmal atrial fibrillation Patient being continued on her Eliquis, diltiazem and also noted to be on metoprolol.  Remains stable.  Constipation Laxatives initiated.  Lactic acidosis This was mild.  DVT prophylaxis: Eliquis Code Status: Full code Family Communication: Patient's daughter being updated daily. Disposition: Hopefully return home in improved.  Home health along with 24-hour supervision recommended by PT.  Discussed with patient's daughter.  Apparently patient's husband is also elderly and unable to provide assistance.  Patient may need to  go to skilled nursing facility for short-term rehab.  Will involve transition of care.  Status is: Inpatient  Remains inpatient appropriate because:IV treatments appropriate due to intensity of illness or inability to take PO and Inpatient level of care appropriate due to  severity of illness   Dispo:  Patient From: Home  Planned Disposition: To be determined  Expected discharge date: 01/02/20  Medically stable for discharge: No     Consultants:   Nephrology  Procedures:  Echo IMPRESSIONS    1. Left ventricular ejection fraction, by estimation, is 50 to 55%. The  left ventricle has low normal function. The left ventricle has no regional  wall motion abnormalities. There is mild concentric left ventricular  hypertrophy. Left ventricular  diastolic function could not be evaluated. The average left ventricular  global longitudinal strain is -7.1 %.  2. Right ventricular systolic function is mildly reduced. The right  ventricular size is mildly enlarged. There is normal pulmonary artery  systolic pressure. The estimated right ventricular systolic pressure is  61.4 mmHg.  3. Left atrial size was mildly dilated.  4. The mitral valve is degenerative. Mild mitral valve regurgitation. No  evidence of mitral stenosis.  5. The aortic valve is tricuspid. Aortic valve regurgitation is not  visualized. No aortic stenosis is present.  6. The inferior vena cava is normal in size with greater than 50%  respiratory variability, suggesting right atrial pressure of 3 mmHg.   Comparison(s): Prior images unable to be directly viewed, comparison made  by report only. No significant change from prior study.    Antimicrobials:  Anti-infectives (From admission, onward)   Start     Dose/Rate Route Frequency Ordered Stop   12/24/19 1000  cefTRIAXone (ROCEPHIN) 2 g in sodium chloride 0.9 % 100 mL IVPB        2 g 200 mL/hr over 30 Minutes Intravenous Every 24 hours 12/24/19 0843 12/28/19 1001   12/24/19 1000  azithromycin (ZITHROMAX) 500 mg in sodium chloride 0.9 % 250 mL IVPB        500 mg 250 mL/hr over 60 Minutes Intravenous Every 24 hours 12/24/19 0843 12/28/19 1104   12/23/19 1000  remdesivir 100 mg in sodium chloride 0.9 % 100 mL IVPB       "Followed  by" Linked Group Details   100 mg 200 mL/hr over 30 Minutes Intravenous Daily 12/14/2019 2317 12/26/19 0842   12/23/19 0001  remdesivir 100 mg in sodium chloride 0.9 % 100 mL IVPB       "Followed by" Linked Group Details   100 mg 200 mL/hr over 30 Minutes Intravenous  Once 01/09/2020 2317 12/23/19 0224   12/23/2019 2330  remdesivir 100 mg in sodium chloride 0.9 % 100 mL IVPB       "Followed by" Linked Group Details   100 mg 200 mL/hr over 30 Minutes Intravenous  Once 01/03/2020 2317 12/23/19 0052      Subjective: Patient states that overall she feels well.  Her shortness of breath has improved.  Denies any significant cough.  She does mention that she has not had a bowel movement in several days.  Denies any abdominal pain.  She says that she would prefer to go home if possible but understands that she is going to need a lot of assistance which her husband cannot provide.  Objective: Vitals:   12/31/19 2156 01/01/20 0500 01/01/20 0543 01/01/20 0954  BP: (!) 173/79  (!) 177/81 (!) 157/76  Pulse: 69  69 71  Resp:   20   Temp: 97.7 F (36.5 C)  97.6 F (36.4 C)   TempSrc: Oral  Oral   SpO2: 91%  (!) 87%   Weight:  105 kg    Height:        Intake/Output Summary (Last 24 hours) at 01/01/2020 1128 Last data filed at 01/01/2020 1000 Gross per 24 hour  Intake 340 ml  Output 500 ml  Net -160 ml   Filed Weights   12/30/19 0615 12/31/19 0544 01/01/20 0500  Weight: 97 kg 105.8 kg 105 kg    Examination:  General appearance: Awake alert.  In no distress Resp: Less tachypneic compared to before.  No wheezing or rhonchi.  Few crackles at the bases.  Improved aeration compared to the last few days.   Cardio: S1-S2 is normal regular.  No S3-S4.  No rubs murmurs or bruit GI: Abdomen is soft.  Nontender nondistended.  Bowel sounds are present normal.  No masses organomegaly Extremities: Edematous lower and upper extremities. Neurologic: Alert and oriented x3.  No focal neurological deficits.        Data Reviewed: I have personally reviewed following labs and imaging studies  CBC: Recent Labs  Lab 12/26/19 0448 12/27/19 0441 12/28/19 0412  WBC 14.3* 13.6* 13.2*  NEUTROABS 13.1* 12.4* 12.2*  HGB 13.2 13.1 12.7  HCT 38.5 38.5 37.5  MCV 93.4 93.7 92.8  PLT 300 318 962    Basic Metabolic Panel: Recent Labs  Lab 12/26/19 0448 12/26/19 0448 12/27/19 0441 12/27/19 1553 12/28/19 0412 12/29/19 0421 12/30/19 0340 12/31/19 0436 01/01/20 0628  NA 131*   < > 128*   < > 130* 132* 136 138 138  K 3.9   < > 4.4   < > 4.0 4.0 4.5 4.8 5.0  CL 100   < > 99   < > 99 103 106 107 104  CO2 19*   < > 17*   < > 18* 19* 20* 19* 22  GLUCOSE 242*   < > 252*   < > 327* 214* 104* 88 195*  BUN 97*   < > 93*   < > 107* 106* 93* 114* 114*  CREATININE 3.13*   < > 3.60*   < > 3.56* 3.48* 3.49* 3.22* 3.03*  CALCIUM 7.5*   < > 7.2*   < > 7.4* 7.3* 7.4* 7.9* 8.9  MG 2.5*  --  2.5*  --  2.6*  --   --   --   --    < > = values in this interval not displayed.    GFR: Estimated Creatinine Clearance: 16.3 mL/min (A) (by C-G formula based on SCr of 3.03 mg/dL (H)).  Liver Function Tests: Recent Labs  Lab 12/27/19 0441 12/28/19 0412 12/29/19 0421 12/30/19 0340 12/31/19 0436  AST 22 16 16 16 23   ALT 16 18 19 18 22   ALKPHOS 47 62 58 51 52  BILITOT 0.8 0.3 0.4 0.5 0.4  PROT 4.8* 4.7* 4.6* 4.8* 5.2*  ALBUMIN 2.0* 1.9* 1.9* 1.9* 2.4*    CBG: Recent Labs  Lab 12/31/19 0814 12/31/19 1143 12/31/19 1729 12/31/19 2152 01/01/20 0751  GLUCAP 70 144* 146* 238* 158*     Recent Results (from the past 240 hour(s))  SARS Coronavirus 2 by RT PCR (hospital order, performed in [place] hospital lab) Nasopharyngeal Nasopharyngeal Swab     Status: Abnormal   Collection Time: 12/31/2019 10:07 PM   Specimen: Nasopharyngeal Swab  Result Value Ref Range Status  SARS Coronavirus 2 POSITIVE (A) NEGATIVE Final    Comment: RESULT CALLED TO, READ BACK BY AND VERIFIED WITH: LISA ADKINS RN AT 2315  ON 12/24/2019 BY I.[email or web address] (NOTE) SARS-CoV-2 target nucleic acids are DETECTED  SARS-CoV-2 RNA is generally detectable in upper respiratory specimens  during the acute phase of infection.  Positive results are indicative  of the presence of the identified virus, but do not rule out bacterial infection or co-infection with other pathogens not detected by the test.  Clinical correlation with patient history and  other diagnostic information is necessary to determine patient infection status.  The expected result is negative.  Fact Sheet for Patients:   StrictlyIdeas.no   Fact Sheet for Healthcare Providers:   BankingDealers.co.za    This test is not yet approved or cleared by the [place] FDA and  has been authorized for detection and/or diagnosis of SARS-CoV-2 by FDA under an Emergency Use Authorization (EUA).  This EUA will remain in effect (mea ning this test can be used) for the duration of  the COVID-19 declaration under Section 564(b)(1) of the Act, 21 U.S.C. section 360-bbb-3(b)(1), unless the authorization is terminated or revoked sooner.  Performed at [facility], [address] [place], [place] [address]   Blood Culture (routine x 2)     Status: None   Collection Time: 01/07/2020 10:07 PM   Specimen: BLOOD  Result Value Ref Range Status   Specimen Description   Final    BLOOD BLOOD RIGHT HAND Performed at [facility], [address] [place], [place] [address]    Special Requests   Final    BOTTLES DRAWN AEROBIC AND ANAEROBIC Blood Culture adequate volume Performed at [facility], [address] [place], [place] [address]    Culture   Final    NO GROWTH 5 DAYS Performed at [facility], [place] [address] [place], [place] [address]    Report Status 12/28/2019 FINAL  Final  Blood Culture (routine x 2)     Status: None   Collection Time: 12/15/2019 10:07 PM   Specimen: BLOOD   Result Value Ref Range Status   Specimen Description   Final    BLOOD LEFT ANTECUBITAL Performed at [facility], [address] [place], [place] [address]    Special Requests   Final    BOTTLES DRAWN AEROBIC AND ANAEROBIC Blood Culture adequate volume Performed at [facility], [address] [place], [place] [address]    Culture   Final    NO GROWTH 5 DAYS Performed at [facility], [place] [address] [place], [place] [address]    Report Status 12/28/2019 FINAL  Final         Radiology Studies: No results found.   Scheduled Meds: . apixaban  2.5 mg Oral BID  . atorvastatin  80 mg Oral QHS  . benzonatate  100 mg Oral TID  . diltiazem  360 mg Oral Daily  . feeding supplement (ENSURE ENLIVE)  237 mL Oral TID BM  . insulin aspart  0-20 Units Subcutaneous TID WC  . insulin aspart  0-5 Units Subcutaneous QHS  . insulin aspart  14 Units Subcutaneous TID WC  . insulin glargine  70 Units Subcutaneous Daily  . levothyroxine  50 mcg Oral Daily  . linagliptin  5 mg Oral Daily  . metoprolol succinate  25 mg Oral Daily  . polyethylene glycol  17  g Oral BID  . predniSONE  40 mg Oral Q breakfast  . senna  2 tablet Oral BID  . sodium bicarbonate  650 mg Oral TID   Continuous Infusions: . sodium chloride 10 mL/hr at 12/31/19 0236     LOS: 9 days   Bonnielee Haff, MD [facility]   To contact the attending provider between 7A-7P or the covering provider during after hours 7P-7A, please log into the web site [email or web address] and access using universal [place] password for that web site. If you do not have the password, please call the hospital operator.  01/01/2020, 11:28 AM

## 2020-01-02 LAB — BASIC METABOLIC PANEL
Anion gap: 15 (ref 5–15)
BUN: 113 mg/dL — ABNORMAL HIGH (ref 8–23)
CO2: 22 mmol/L (ref 22–32)
Calcium: 9.4 mg/dL (ref 8.9–10.3)
Chloride: 104 mmol/L (ref 98–111)
Creatinine, Ser: 2.72 mg/dL — ABNORMAL HIGH (ref 0.44–1.00)
GFR calc Af Amer: 18 mL/min — ABNORMAL LOW (ref >60)
GFR calc non Af Amer: 16 mL/min — ABNORMAL LOW (ref >60)
Glucose, Bld: 205 mg/dL — ABNORMAL HIGH (ref 70–99)
Potassium: 4.6 mmol/L (ref 3.5–5.1)
Sodium: 141 mmol/L (ref 135–145)

## 2020-01-02 LAB — CBC
HCT: 40.1 % (ref 36.0–46.0)
Hemoglobin: 13.3 g/dL (ref 12.0–15.0)
MCH: 31.6 pg (ref 26.0–34.0)
MCHC: 33.2 g/dL (ref 30.0–36.0)
MCV: 95.2 fL (ref 80.0–100.0)
Platelets: 304 10*3/uL (ref 150–400)
RBC: 4.21 MIL/uL (ref 3.87–5.11)
RDW: 15.8 % — ABNORMAL HIGH (ref 11.5–15.5)
WBC: 23.2 10*3/uL — ABNORMAL HIGH (ref 4.0–10.5)
nRBC: 0 % (ref 0.0–0.2)

## 2020-01-02 LAB — GLUCOSE, CAPILLARY
Glucose-Capillary: 114 mg/dL — ABNORMAL HIGH (ref 70–99)
Glucose-Capillary: 111 mg/dL — ABNORMAL HIGH (ref 70–99)
Glucose-Capillary: 71 mg/dL (ref 70–99)
Glucose-Capillary: 125 mg/dL — ABNORMAL HIGH (ref 70–99)
Glucose-Capillary: 59 mg/dL — ABNORMAL LOW (ref 70–99)

## 2020-01-02 MED ORDER — FUROSEMIDE 10 MG/ML IJ SOLN
80.0000 mg | Freq: Once | INTRAMUSCULAR | Status: AC
  Administered 2020-01-02: 80 mg via INTRAVENOUS
  Filled 2020-01-02: qty 8

## 2020-01-02 MED ORDER — INSULIN ASPART 100 UNIT/ML ~~LOC~~ SOLN: 6.0000 [IU] | Freq: Three times a day (TID) | SUBCUTANEOUS | Status: DC

## 2020-01-02 MED ORDER — INSULIN GLARGINE 100 UNIT/ML ~~LOC~~ SOLN
60.0000 [IU] | Freq: Every day | SUBCUTANEOUS | Status: DC
  Filled 2020-01-02: qty 0.6

## 2020-01-02 MED ORDER — FUROSEMIDE 10 MG/ML IJ SOLN
80.0000 mg | Freq: Two times a day (BID) | INTRAMUSCULAR | Status: AC
  Administered 2020-01-03 (×2): 80 mg via INTRAVENOUS
  Filled 2020-01-02 (×2): qty 8

## 2020-01-02 MED ORDER — FUROSEMIDE 10 MG/ML IJ SOLN: 80.0000 mg | Freq: Two times a day (BID) | INTRAMUSCULAR | Status: DC

## 2020-01-02 MED ORDER — ALBUMIN HUMAN 25 % IV SOLN
25.0000 g | Freq: Once | INTRAVENOUS | Status: AC
  Administered 2020-01-02: 25 g via INTRAVENOUS
  Filled 2020-01-02: qty 100

## 2020-01-02 MED ORDER — ALPRAZOLAM 0.25 MG PO TABS
0.2500 mg | ORAL_TABLET | Freq: Two times a day (BID) | ORAL | Status: DC | PRN
  Administered 2020-01-04: 0.25 mg via ORAL
  Filled 2020-01-02: qty 1

## 2020-01-02 NOTE — Progress Notes (Signed)
PROGRESS NOTE    Kathryn Richardson  DGU:[id number] DOB: May 23, 1934 DOA: 01/01/2020 PCP: [name], MD   Chief Complaint  Patient presents with  . Covid positive    Brief Narrative:  Kathryn Richardson  is a 85 y.o. female, with history of type 2 diabetes mellitus, hyperlipidemia, GERD, hypertension, chronic renal disease stage IV, atrial fibrillation, and more presents to the ED with a chief complaint of difficulty breathing.  Patient reports symptoms started 9 days ago.  She had Covid test 8 days ago and it was positive.  She reports that she had monoclonal antibody infusion on 10 August.    However her symptoms continue to progress.  She presented to the emergency department.  She was noted to be hypoxic.  She was hospitalized for further management.    Assessment & Plan:  Acute hypoxic respiratory failure/pneumonia due to COVID-19  Patient was vaccinated back in February. This will be considered breakthrough infection.   She was treated with RegenCov on August 10. Patient presented with hypoxia. On admission patient was started on remdesivir and steroids.  Due to elevated procalcitonin patient was given a 5-day course of antibacterials.   CRP was noted to be elevated and improved.  D-dimer was never significantly elevated. Was not given Actemra or baricitinib due to concern for secondary bacterial infection. Patient's oxygen requirements appears to have plateaued.  Currently on 6 L of oxygen.  Chest x-ray done recently showed stable findings.  She is saturating in the late 80s to early 90s.  Feels anxious this morning.  Concerned about her lack of improvement.  Patient was reassured.  Educated about the disease process. We will give her another dose of Lasix today.  If there is no improvement then will consider doing a noncontrast CT of her chest tomorrow.   Continue oral steroids and start tapering slowly.  Acute kidney injury on chronic kidney disease stage IV/normal anion gap metabolic  acidosis/hyponatremia Patient's baseline creatinine is around 1.7-2.2.  Came in with baseline renal function but has worsened since admission.  Patient was on ACE inhibitor and was also given Lasix x1.  These agents were discontinued.   Patient was subsequently started on IV fluids. UA was reviewed. Urine sodium was less than 10. Patient urine output did pick up but her creatinine remained elevated.  She also developed edema in her upper and lower extremities.  Albumin is noted to be low. Nephrology was consulted.  She was given Lasix with albumin.  She was given another dose of albumin.  Creatinine has been improving the last several days.  Give another dose of Lasix today with albumin.  No uremic symptoms even though BUN remains elevated. Continue to monitor urine output.  Nephrology has been following. Renal dysfunction most likely due to multiple acute issues including COVID-19.  Previously done renal ultrasound did not show any hydronephrosis.  Remains on oral sodium bicarbonate for metabolic acidosis which has improved.  Diabetes mellitus type 2, uncontrolled with hyperglycemia Poor glycemic control is likely due to steroids.  Patient is on Lantus SSI.  HbA1c 10.4.  CBGs are stable for the most part.  Did improve as steroid is tapered down.  May need to cut back on her Lantus as well as meal coverage.  History of paroxysmal atrial fibrillation Patient being continued on her Eliquis, diltiazem and also noted to be on metoprolol.  Remains stable.  Anxiety This is related to her acute situation.  Alprazolam as needed.  Constipation Laxatives initiated.  Lactic  acidosis This was mild.  DVT prophylaxis: Eliquis Code Status: Full code Family Communication: Patient's daughter being updated daily. Disposition: Hopefully return home in improved.  Home health along with 24-hour supervision recommended by PT.  Discussed with patient's daughter.  Apparently patient's husband is also elderly and  unable to provide assistance.  Patient may need to go to skilled nursing facility for short-term rehab.  Will involve transition of care.  Status is: Inpatient  Remains inpatient appropriate because:IV treatments appropriate due to intensity of illness or inability to take PO and Inpatient level of care appropriate due to severity of illness   Dispo:  Patient From: Home  Planned Disposition: [place]  Expected discharge date: 01/03/20  Medically stable for discharge: No     Consultants:   Nephrology  Procedures:  Echo IMPRESSIONS    1. Left ventricular ejection fraction, by estimation, is 50 to 55%. The  left ventricle has low normal function. The left ventricle has no regional  wall motion abnormalities. There is mild concentric left ventricular  hypertrophy. Left ventricular  diastolic function could not be evaluated. The average left ventricular  global longitudinal strain is -7.1 %.  2. Right ventricular systolic function is mildly reduced. The right  ventricular size is mildly enlarged. There is normal pulmonary artery  systolic pressure. The estimated right ventricular systolic pressure is  63.7 mmHg.  3. Left atrial size was mildly dilated.  4. The mitral valve is degenerative. Mild mitral valve regurgitation. No  evidence of mitral stenosis.  5. The aortic valve is tricuspid. Aortic valve regurgitation is not  visualized. No aortic stenosis is present.  6. The inferior vena cava is normal in size with greater than 50%  respiratory variability, suggesting right atrial pressure of 3 mmHg.   Comparison(s): Prior images unable to be directly viewed, comparison made  by report only. No significant change from prior study.    Antimicrobials:  Anti-infectives (From admission, onward)   Start     Dose/Rate Route Frequency Ordered Stop   12/24/19 1000  cefTRIAXone (ROCEPHIN) 2 g in sodium chloride 0.9 % 100 mL IVPB        2 g 200 mL/hr over 30  Minutes Intravenous Every 24 hours 12/24/19 0843 12/28/19 1001   12/24/19 1000  azithromycin (ZITHROMAX) 500 mg in sodium chloride 0.9 % 250 mL IVPB        500 mg 250 mL/hr over 60 Minutes Intravenous Every 24 hours 12/24/19 0843 12/28/19 1104   12/23/19 1000  remdesivir 100 mg in sodium chloride 0.9 % 100 mL IVPB       "Followed by" Linked Group Details   100 mg 200 mL/hr over 30 Minutes Intravenous Daily 12/29/2019 2317 12/26/19 0842   12/23/19 0001  remdesivir 100 mg in sodium chloride 0.9 % 100 mL IVPB       "Followed by" Linked Group Details   100 mg 200 mL/hr over 30 Minutes Intravenous  Once 01/07/2020 2317 12/23/19 0224   12/20/2019 2330  remdesivir 100 mg in sodium chloride 0.9 % 100 mL IVPB       "Followed by" Linked Group Details   100 mg 200 mL/hr over 30 Minutes Intravenous  Once 12/31/2019 2317 12/23/19 0052      Subjective: Patient states that she is very anxious.  She seems to be discouraged by her lack of progress.  Denies any chest pain.  No worsening in her shortness of breath.  Continues to have occasional dry cough.  No  nausea vomiting.  Objective: Vitals:   01/02/20 0513 01/02/20 0514 01/02/20 1037 01/02/20 1049  BP: (!) 185/93   (!) 165/88  Pulse: 75   79  Resp: 16     Temp: (!) 97.5 F (36.4 C)   (!) 97.4 F (36.3 C)  TempSrc: Oral   Oral  SpO2: (!) 87%  (!) 85% (!) 87%  Weight:  104.3 kg    Height:        Intake/Output Summary (Last 24 hours) at 01/02/2020 1153 Last data filed at 01/02/2020 0516 Gross per 24 hour  Intake 370 ml  Output 1225 ml  Net -855 ml   Filed Weights   12/31/19 0544 01/01/20 0500 01/02/20 0514  Weight: 105.8 kg 105 kg 104.3 kg    Examination:  General appearance: Awake alert.  In no distress.  Anxious Resp: Normal effort at rest.  Coarse breath sounds bilaterally with few crackles at the bases.  No wheezing or rhonchi. Cardio: S1-S2 is normal regular.  No S3-S4.  No rubs murmurs or bruit GI: Abdomen is soft.  Nontender  nondistended.  Bowel sounds are present normal.  No masses organomegaly Extremities: Edema in the upper and lower extremities.  Slightly better. Neurologic: Alert and oriented x3.  No focal neurological deficits.      Data Reviewed: I have personally reviewed following labs and imaging studies  CBC: Recent Labs  Lab 12/27/19 0441 12/28/19 0412 01/02/20 0253  WBC 13.6* 13.2* 23.2*  NEUTROABS 12.4* 12.2*  --   HGB 13.1 12.7 13.3  HCT 38.5 37.5 40.1  MCV 93.7 92.8 95.2  PLT 318 318 008    Basic Metabolic Panel: Recent Labs  Lab 12/27/19 0441 12/27/19 1553 12/28/19 0412 12/28/19 0412 12/29/19 0421 12/30/19 0340 12/31/19 0436 01/01/20 0628 01/02/20 0253  NA 128*   < > 130*   < > 132* 136 138 138 141  K 4.4   < > 4.0   < > 4.0 4.5 4.8 5.0 4.6  CL 99   < > 99   < > 103 106 107 104 104  CO2 17*   < > 18*   < > 19* 20* 19* 22 22  GLUCOSE 252*   < > 327*   < > 214* 104* 88 195* 205*  BUN 93*   < > 107*   < > 106* 93* 114* 114* 113*  CREATININE 3.60*   < > 3.56*   < > 3.48* 3.49* 3.22* 3.03* 2.72*  CALCIUM 7.2*   < > 7.4*   < > 7.3* 7.4* 7.9* 8.9 9.4  MG 2.5*  --  2.6*  --   --   --   --   --   --    < > = values in this interval not displayed.    GFR: Estimated Creatinine Clearance: 18.1 mL/min (A) (by C-G formula based on SCr of 2.72 mg/dL (H)).  Liver Function Tests: Recent Labs  Lab 12/27/19 0441 12/28/19 0412 12/29/19 0421 12/30/19 0340 12/31/19 0436  AST 22 16 16 16 23   ALT 16 18 19 18 22   ALKPHOS 47 62 58 51 52  BILITOT 0.8 0.3 0.4 0.5 0.4  PROT 4.8* 4.7* 4.6* 4.8* 5.2*  ALBUMIN 2.0* 1.9* 1.9* 1.9* 2.4*    CBG: Recent Labs  Lab 01/01/20 1134 01/01/20 1737 01/01/20 2112 01/02/20 0844 01/02/20 1128  GLUCAP 246* 339* 295* 114* 111*     No results found for this or any previous visit (from the past  240 hour(s)).       Radiology Studies: DG Chest Port 1 View  Result Date: 01/01/2020 CLINICAL DATA:  COVID-19 positive, hypoxia EXAM: PORTABLE  CHEST 1 VIEW COMPARISON:  12/30/2019 FINDINGS: Single frontal view of the chest demonstrates stable dual lead pacer. Cardiac silhouette is unremarkable. Multifocal bilateral airspace disease is unchanged. No effusion or pneumothorax. IMPRESSION: 1. Stable bilateral multifocal pneumonia. Electronically Signed   By: Randa Ngo M.D.   On: 01/01/2020 16:09     Scheduled Meds: . apixaban  2.5 mg Oral BID  . atorvastatin  80 mg Oral QHS  . benzonatate  100 mg Oral TID  . diltiazem  360 mg Oral Daily  . feeding supplement (ENSURE ENLIVE)  237 mL Oral TID BM  . insulin aspart  0-20 Units Subcutaneous TID WC  . insulin aspart  0-5 Units Subcutaneous QHS  . insulin aspart  14 Units Subcutaneous TID WC  . insulin glargine  70 Units Subcutaneous Daily  . levothyroxine  50 mcg Oral Daily  . linagliptin  5 mg Oral Daily  . metoprolol succinate  25 mg Oral Daily  . polyethylene glycol  17 g Oral BID  . predniSONE  40 mg Oral Q breakfast  . senna  2 tablet Oral BID  . sodium bicarbonate  650 mg Oral TID   Continuous Infusions: . sodium chloride 10 mL/hr at 12/31/19 0236     LOS: 10 days   Bonnielee Haff, MD [facility]   To contact the attending provider between 7A-7P or the covering provider during after hours 7P-7A, please log into the web site [email or web address] and access using universal [place] password for that web site. If you do not have the password, please call the hospital operator.  01/02/2020, 11:53 AM

## 2020-01-02 NOTE — Care Management Important Message (Signed)
Important Message  Patient Details IM Letter given to the Patient Name: Kathryn Richardson MRN: [id number] Date of Birth: [birth year]   Medicare Important Message Given:  Yes     Kerin Salen 01/02/2020, 10:28 AM

## 2020-01-02 NOTE — Progress Notes (Signed)
PT Cancellation Note  Patient Details Name: Kathryn Richardson MRN: [id number] DOB: [birth year]   Cancelled Treatment:    Reason Eval/Treat Not Completed: Patient declined, no reason specified  Pt declined to participate.  Observed edematous UEs.  Right arm on pillow however left was not so placed pillow under left UE.   Alitzel Cookson,[name] 01/02/2020, 3:38 PM Jannette Spanner PT, DPT Acute Rehabilitation Services Pager: [phone] Office: [phone]

## 2020-01-02 NOTE — Progress Notes (Signed)
Inpatient Diabetes Program Recommendations  AACE/ADA: New Consensus Statement on Inpatient Glycemic Control (2015)  Target Ranges:  Prepandial:   less than 140 mg/dL      Peak postprandial:   less than 180 mg/dL (1-2 hours)      Critically ill patients:  140 - 180 mg/dL   Lab Results  Component Value Date   GLUCAP 114 (H) 01/02/2020   HGBA1C 10.4 (H) 12/25/2019    Review of Glycemic Control Results for Kathryn Richardson, Kathryn Richardson (MRN [id number]) as of 01/02/2020 10:02  Ref. Range 01/01/2020 07:51 01/01/2020 11:34 01/01/2020 17:37 01/01/2020 21:12 01/02/2020 08:44  Glucose-Capillary Latest Ref Range: 70 - 99 mg/dL 158 (H)  Novolog 4 units given late at 1024 am  Lantus 70 units  Tradjenta 5 mg   Prednisone 40 mg  Ate 25%  Supplement given 246 (H)  Novolog 21 units given late at 1300            Ate 25%  Supplement given at 1517 339 (H)  Novolog 15 units               Supplement given at 2005    295 (H)  Novolog 3 units 114 (H)   Diabetes history: DM 2 Outpatient Diabetes medications: Lantus 62 units Daily Current orders for Inpatient glycemic control:  Lantus 70 units Daily Novolog 0-20 units tid + hs Novolog 14 units tid meal coverage Tradjenta 5 mg Daily  PO Prednisone 40 mg Daily first dose 8/22 Ensure Enlive tid between meals  Inpatient Diabetes Program Recommendations:    Fasting glucose trends at goal, however glucose trends increase after meal and supplement intake.  Note meal coverage insulin on being consistently given due to poor po intake, however pt on supplementation and will need to consistently get Meal coverage insulin to cover those carbohydrates.  Thanks, Tama Headings RN, MSN, BC-ADM Inpatient Diabetes Coordinator Team Pager [phone] (8a-5p)

## 2020-01-02 NOTE — Progress Notes (Signed)
[facility] Progress Note    Assessment/ Plan:   Non-Oliguric AKI (stable) on CKD 4: Likely secondary to COVID, possibly intravascularly depleted at time of consult. Admit creat 1.7 on 8/12, peak creat 3.6 on 8/17, now B/creat continues to improve at 114/3.0 yest and 113/2.72  today.  BUN remains high at 113 today.  -baseline Cr around 1.8-2.2. Follows with Dr. Kern Alberta at [facility]. Underlying CKD secondary to longstanding DM and poorly controlled HTN - no obstruction on renal ultrasound: cortical thinning+ medical renal disease bilat - no indication for renal replacement therapy at this time -up 10kg from admit, ++edema on exam and worsening SOB, will start give trial of IV lasix 80 bid  COVID-19 Pneumonia/AHRF -worsening sob again today -sp remdesivir, steroids. Completed empiric abx -mgmt per primary service. On supplemental O2  Anasarca/ hypoalbuminemia  -persistent -likely third spacing from hypoalbuminemia   Non-gap Metabolic Acidosis, improved -corrected anion gap 15 -likely related to AKI   Hypertension: -on toprol xl and cardizem  Afib -renally dose eliquis -rate control per primary service  Uncontrolled Diabetes Mellitus Type 2 with Hyperglycemia -mgmt per primary service   Kelly Splinter, MD 01/02/2020, 1:52 PM       Subjective:   "I don't feel good", mostly SOB is the problem.  I/O net neg 600 cc yest Wt's down 1/2 kg today   Objective:   BP (!) 165/88   Pulse 79   Temp (!) 97.4 F (36.3 C) (Oral)   Resp 16   Ht 5\' 3"  (1.6 m)   Wt 104.3 kg   SpO2 (!) 87%   BMI 40.73 kg/m   Intake/Output Summary (Last 24 hours) at 01/02/2020 1352 Last data filed at 01/02/2020 0516 Gross per 24 hour  Intake 250 ml  Output 1225 ml  Net -975 ml   Weight change: -0.7 kg  Physical Exam: Gen:nad, sitting up in bed CVS: Regular rate Resp: increased work of breathing, bilateral chest expansion, on [place] Abd: Nondistended Ext: Pitting edema in  all extremities (improved) Neuro: No asterixis, speech clear and coherent, moves all extremities spontaneously  Imaging: DG Chest Port 1 View  Result Date: 01/01/2020 CLINICAL DATA:  COVID-19 positive, hypoxia EXAM: PORTABLE CHEST 1 VIEW COMPARISON:  12/30/2019 FINDINGS: Single frontal view of the chest demonstrates stable dual lead pacer. Cardiac silhouette is unremarkable. Multifocal bilateral airspace disease is unchanged. No effusion or pneumothorax. IMPRESSION: 1. Stable bilateral multifocal pneumonia. Electronically Signed   By: Randa Ngo M.D.   On: 01/01/2020 16:09    Labs: BMET Recent Labs  Lab 12/27/19 1553 12/28/19 0412 12/29/19 0421 12/30/19 0340 12/31/19 0436 01/01/20 0628 01/02/20 0253  NA 132* 130* 132* 136 138 138 141  K 3.9 4.0 4.0 4.5 4.8 5.0 4.6  CL 98 99 103 106 107 104 104  CO2 18* 18* 19* 20* 19* 22 22  GLUCOSE 316* 327* 214* 104* 88 195* 205*  BUN 116* 107* 106* 93* 114* 114* 113*  CREATININE 3.78* 3.56* 3.48* 3.49* 3.22* 3.03* 2.72*  CALCIUM 7.6* 7.4* 7.3* 7.4* 7.9* 8.9 9.4   CBC Recent Labs  Lab 12/27/19 0441 12/28/19 0412 01/02/20 0253  WBC 13.6* 13.2* 23.2*  NEUTROABS 12.4* 12.2*  --   HGB 13.1 12.7 13.3  HCT 38.5 37.5 40.1  MCV 93.7 92.8 95.2  PLT 318 318 304    Medications:    . apixaban  2.5 mg Oral BID  . atorvastatin  80 mg Oral QHS  . benzonatate  100 mg Oral TID  .  diltiazem  360 mg Oral Daily  . feeding supplement (ENSURE ENLIVE)  237 mL Oral TID BM  . insulin aspart  0-20 Units Subcutaneous TID WC  . insulin aspart  0-5 Units Subcutaneous QHS  . insulin aspart  6 Units Subcutaneous TID WC  . [START ON 01/03/2020] insulin glargine  60 Units Subcutaneous Daily  . levothyroxine  50 mcg Oral Daily  . linagliptin  5 mg Oral Daily  . metoprolol succinate  25 mg Oral Daily  . polyethylene glycol  17 g Oral BID  . predniSONE  40 mg Oral Q breakfast  . senna  2 tablet Oral BID  . sodium bicarbonate  650 mg Oral TID

## 2020-01-03 ENCOUNTER — Inpatient Hospital Stay (HOSPITAL_COMMUNITY): Payer: Medicare Other

## 2020-01-03 ENCOUNTER — Encounter (HOSPITAL_COMMUNITY): Payer: Self-pay | Admitting: Family Medicine

## 2020-01-03 DIAGNOSIS — R112 Nausea with vomiting, unspecified: Secondary | ICD-10-CM

## 2020-01-03 LAB — BASIC METABOLIC PANEL
Anion gap: 15 (ref 5–15)
BUN: 117 mg/dL — ABNORMAL HIGH (ref 8–23)
CO2: 26 mmol/L (ref 22–32)
Calcium: 9.4 mg/dL (ref 8.9–10.3)
Chloride: 100 mmol/L (ref 98–111)
Creatinine, Ser: 2.63 mg/dL — ABNORMAL HIGH (ref 0.44–1.00)
GFR calc Af Amer: 19 mL/min — ABNORMAL LOW (ref >60)
GFR calc non Af Amer: 16 mL/min — ABNORMAL LOW (ref >60)
Glucose, Bld: 151 mg/dL — ABNORMAL HIGH (ref 70–99)
Potassium: 5 mmol/L (ref 3.5–5.1)
Sodium: 141 mmol/L (ref 135–145)

## 2020-01-03 LAB — GLUCOSE, CAPILLARY
Glucose-Capillary: 138 mg/dL — ABNORMAL HIGH (ref 70–99)
Glucose-Capillary: 143 mg/dL — ABNORMAL HIGH (ref 70–99)
Glucose-Capillary: 120 mg/dL — ABNORMAL HIGH (ref 70–99)
Glucose-Capillary: 116 mg/dL — ABNORMAL HIGH (ref 70–99)

## 2020-01-03 MED ORDER — FLEET ENEMA 7-19 GM/118ML RE ENEM
1.0000 | ENEMA | Freq: Once | RECTAL | Status: AC
  Administered 2020-01-03: 1 via RECTAL
  Filled 2020-01-03: qty 1

## 2020-01-03 MED ORDER — IOHEXOL 9 MG/ML PO SOLN
500.0000 mL | ORAL | Status: AC
  Administered 2020-01-03: 500 mL via ORAL

## 2020-01-03 MED ORDER — FUROSEMIDE 10 MG/ML IJ SOLN
80.0000 mg | Freq: Once | INTRAMUSCULAR | Status: AC
  Administered 2020-01-03: 80 mg via INTRAVENOUS
  Filled 2020-01-03: qty 8

## 2020-01-03 MED ORDER — INSULIN GLARGINE 100 UNIT/ML ~~LOC~~ SOLN
40.0000 [IU] | Freq: Every day | SUBCUTANEOUS | Status: DC
  Administered 2020-01-04 – 2020-01-05 (×2): 40 [IU] via SUBCUTANEOUS
  Filled 2020-01-03 (×4): qty 0.4

## 2020-01-03 MED ORDER — IOHEXOL 9 MG/ML PO SOLN
ORAL | Status: AC
  Filled 2020-01-03: qty 1000

## 2020-01-03 MED ORDER — HYDRALAZINE HCL 20 MG/ML IJ SOLN
10.0000 mg | Freq: Four times a day (QID) | INTRAMUSCULAR | Status: DC | PRN
  Administered 2020-01-03 – 2020-01-04 (×2): 10 mg via INTRAVENOUS
  Filled 2020-01-03 (×2): qty 1

## 2020-01-03 MED ORDER — PROMETHAZINE HCL 25 MG/ML IJ SOLN
12.5000 mg | Freq: Once | INTRAMUSCULAR | Status: AC
  Administered 2020-01-03: 12.5 mg via INTRAVENOUS
  Filled 2020-01-03: qty 1

## 2020-01-03 NOTE — Progress Notes (Signed)
Hypoglycemic Event  CBG: 59  Treatment: 8 oz juice/soda  Symptoms: None  Follow-up CBG: Time:2226 CBG Result:125  Possible Reasons for Event: Inadequate meal intake  Comments/MD notified: Yes, Protocol activated    Kathryn Richardson [name]

## 2020-01-03 NOTE — Plan of Care (Signed)
  Problem: Education: Goal: Knowledge of risk factors and measures for prevention of condition will improve Outcome: Progressing   Problem: Coping: Goal: Psychosocial and spiritual needs will be supported Outcome: Progressing   Problem: Respiratory: Goal: Will maintain a patent airway Outcome: Progressing Goal: Complications related to the disease process, condition or treatment will be avoided or minimized Outcome: Progressing   

## 2020-01-03 NOTE — Progress Notes (Addendum)
PROGRESS NOTE    Kathryn Richardson  ENI:[id number] DOB: 1935-03-11 DOA: 12/28/2019 PCP: [name], MD   Chief Complaint  Patient presents with   Covid positive    Brief Narrative:  Kathryn Richardson  is a 85 y.o. female, with history of type 2 diabetes mellitus, hyperlipidemia, GERD, hypertension, chronic renal disease stage IV, atrial fibrillation, and more presents to the ED with a chief complaint of difficulty breathing.  Patient reports symptoms started 9 days ago.  She had Covid test 8 days ago and it was positive.  She reports that she had monoclonal antibody infusion on 10 August.    However her symptoms continue to progress.  She presented to the emergency department.  She was noted to be hypoxic.  She was hospitalized for further management.    Assessment & Plan:  Acute hypoxic respiratory failure/pneumonia due to COVID-19  Patient was vaccinated back in February. This will be considered breakthrough infection.   She was treated with RegenCov on August 10. Patient presented with hypoxia. On admission patient was started on remdesivir and steroids.  Due to elevated procalcitonin patient was given a 5-day course of antibacterials.   CRP was noted to be elevated and improved.  D-dimer was never significantly elevated. Was not given Actemra or baricitinib due to concern for secondary bacterial infection. Over the last 24 to 48 hours patient's oxygenation has worsened.  She is now on 7 L of oxygen.  Chest x-ray done yesterday did not show any significant changes.  She remains anticoagulated with Eliquis so venous thromboembolism is less likely.  This is likely manifestations of severe pneumonia due to COVID-19.  CT chest may shed some more information.  This will be ordered for today.  Will have to be without contrast due to renal failure. Patient received 2 doses of Lasix yesterday.  Continue steroids which have been transitioned to oral. Significant leukocytosis probably due to steroids.   Will recheck labs tomorrow.  Acute kidney injury on chronic kidney disease stage IV/normal anion gap metabolic acidosis/hyponatremia Patient's baseline creatinine is around 1.7-2.2.  Came in with baseline renal function but has worsened since admission.  Patient was on ACE inhibitor and was also given Lasix x1.  These agents were discontinued.   Patient was subsequently started on IV fluids. UA was reviewed. Urine sodium was less than 10. Patient urine output did pick up but her creatinine remained elevated.  She also developed edema in her upper and lower extremities.  Albumin is noted to be low. Nephrology was consulted.  She was given Lasix with albumin.   Creatinine has been improving.  Diuretics per nephrology.  Received 2 doses yesterday. Renal dysfunction most likely due to multiple acute issues including COVID-19.  Previously done renal ultrasound did not show any hydronephrosis.  Remains on oral sodium bicarbonate for metabolic acidosis which has improved.  Nausea and vomiting with abdominal discomfort in the setting of constipation Abdominal film did not show any obstructive pattern.  Her abdomen is tender to palpate in the lower area.  Symptoms most likely due to constipation.  Enema ordered.  She remains on high doses of lactulose.  Proceed with CT scan.  Diabetes mellitus type 2, uncontrolled with hyperglycemia Patient was placed on high doses of insulin due to hyperglycemia which was thought to be secondary to steroids.  Glucose levels have been low over the last 24 hours likely due to tapering steroids as well as poor oral intake.  Decrease dose of Lantus.  Stop  meal coverage.  HbA1c 10.4.   History of paroxysmal atrial fibrillation Patient being continued on her Eliquis, diltiazem and also noted to be on metoprolol.  Remains stable.  Anxiety This is related to her acute illness.  Alprazolam as needed.  Lactic acidosis This was mild.  DVT prophylaxis: Eliquis Code Status:  Full code Family Communication: Patient's daughter being updated daily. Disposition: Hopefully return home in improved.  Home health along with 24-hour supervision recommended by PT.  Discussed with patient's daughter.  Apparently patient's husband is also elderly and unable to provide assistance.  Patient may need to go to skilled nursing facility for short-term rehab.  Transition of care consulted.  Status is: Inpatient  Remains inpatient appropriate because:IV treatments appropriate due to intensity of illness or inability to take PO and Inpatient level of care appropriate due to severity of illness   Dispo:  Patient From: Home  Planned Disposition: [facility]  Expected discharge date: 01/05/20  Medically stable for discharge: No     Consultants:   Nephrology  Procedures:  Echo IMPRESSIONS    1. Left ventricular ejection fraction, by estimation, is 50 to 55%. The  left ventricle has low normal function. The left ventricle has no regional  wall motion abnormalities. There is mild concentric left ventricular  hypertrophy. Left ventricular  diastolic function could not be evaluated. The average left ventricular  global longitudinal strain is -7.1 %.  2. Right ventricular systolic function is mildly reduced. The right  ventricular size is mildly enlarged. There is normal pulmonary artery  systolic pressure. The estimated right ventricular systolic pressure is  08.6 mmHg.  3. Left atrial size was mildly dilated.  4. The mitral valve is degenerative. Mild mitral valve regurgitation. No  evidence of mitral stenosis.  5. The aortic valve is tricuspid. Aortic valve regurgitation is not  visualized. No aortic stenosis is present.  6. The inferior vena cava is normal in size with greater than 50%  respiratory variability, suggesting right atrial pressure of 3 mmHg.   Comparison(s): Prior images unable to be directly viewed, comparison made  by report only. No  significant change from prior study.    Antimicrobials:  Anti-infectives (From admission, onward)   Start     Dose/Rate Route Frequency Ordered Stop   12/24/19 1000  cefTRIAXone (ROCEPHIN) 2 g in sodium chloride 0.9 % 100 mL IVPB        2 g 200 mL/hr over 30 Minutes Intravenous Every 24 hours 12/24/19 0843 12/28/19 1001   12/24/19 1000  azithromycin (ZITHROMAX) 500 mg in sodium chloride 0.9 % 250 mL IVPB        500 mg 250 mL/hr over 60 Minutes Intravenous Every 24 hours 12/24/19 0843 12/28/19 1104   12/23/19 1000  remdesivir 100 mg in sodium chloride 0.9 % 100 mL IVPB       "Followed by" Linked Group Details   100 mg 200 mL/hr over 30 Minutes Intravenous Daily 12/21/2019 2317 12/26/19 0842   12/23/19 0001  remdesivir 100 mg in sodium chloride 0.9 % 100 mL IVPB       "Followed by" Linked Group Details   100 mg 200 mL/hr over 30 Minutes Intravenous  Once 01/05/2020 2317 12/23/19 0224   01/05/2020 2330  remdesivir 100 mg in sodium chloride 0.9 % 100 mL IVPB       "Followed by" Linked Group Details   100 mg 200 mL/hr over 30 Minutes Intravenous  Once 12/26/2019 2317 12/23/19 0052  Subjective: Patient complains of abdominal pain with nausea and vomiting this morning.  She mentions that she has not had a bowel movement in several days despite laxatives.  Shortness of breath is about the same.  Objective: Vitals:   01/02/20 1716 01/02/20 2131 01/03/20 0055 01/03/20 0641  BP: (!) 149/90 (!) 175/83 (!) 149/75 (!) 180/96  Pulse:  94 79 64  Resp: (!) 21 20 20 20   Temp: 98 F (36.7 C) 98.2 F (36.8 C)  (!) 97.5 F (36.4 C)  TempSrc:  Oral  Oral  SpO2: 90% 90% (!) 89% (!) 85%  Weight:      Height:        Intake/Output Summary (Last 24 hours) at 01/03/2020 1318 Last data filed at 01/03/2020 0641 Gross per 24 hour  Intake 220 ml  Output 2000 ml  Net -1780 ml   Filed Weights   12/31/19 0544 01/01/20 0500 01/02/20 0514  Weight: 105.8 kg 105 kg 104.3 kg    Examination:  General  appearance: Awake alert.  In no distress.  Seems anxious Resp: Slightly tachypneic today.  Coarse breath sounds with crackles in the bases.  No wheezing or rhonchi. Cardio: S1-S2 is normal regular.  No S3-S4.  No rubs murmurs or bruit GI: Abdomen is soft but tender in the lower abdomen both quadrants.  No rebound rigidity or guarding.  Bowel sounds present.  No masses organomegaly.   Extremities: No edema.  Full range of motion of lower extremities. Neurologic:  No focal neurological deficits.       Data Reviewed: I have personally reviewed following labs and imaging studies  CBC: Recent Labs  Lab 12/28/19 0412 01/02/20 0253  WBC 13.2* 23.2*  NEUTROABS 12.2*  --   HGB 12.7 13.3  HCT 37.5 40.1  MCV 92.8 95.2  PLT 318 950    Basic Metabolic Panel: Recent Labs  Lab 12/28/19 0412 12/29/19 0421 12/30/19 0340 12/31/19 0436 01/01/20 0628 01/02/20 0253 01/03/20 0453  NA 130*   < > 136 138 138 141 141  K 4.0   < > 4.5 4.8 5.0 4.6 5.0  CL 99   < > 106 107 104 104 100  CO2 18*   < > 20* 19* 22 22 26   GLUCOSE 327*   < > 104* 88 195* 205* 151*  BUN 107*   < > 93* 114* 114* 113* 117*  CREATININE 3.56*   < > 3.49* 3.22* 3.03* 2.72* 2.63*  CALCIUM 7.4*   < > 7.4* 7.9* 8.9 9.4 9.4  MG 2.6*  --   --   --   --   --   --    < > = values in this interval not displayed.    GFR: Estimated Creatinine Clearance: 18.7 mL/min (A) (by C-G formula based on SCr of 2.63 mg/dL (H)).  Liver Function Tests: Recent Labs  Lab 12/28/19 0412 12/29/19 0421 12/30/19 0340 12/31/19 0436  AST 16 16 16 23   ALT 18 19 18 22   ALKPHOS 62 58 51 52  BILITOT 0.3 0.4 0.5 0.4  PROT 4.7* 4.6* 4.8* 5.2*  ALBUMIN 1.9* 1.9* 1.9* 2.4*    CBG: Recent Labs  Lab 01/02/20 1633 01/02/20 2150 01/02/20 2226 01/03/20 0749 01/03/20 1104  GLUCAP 71 59* 125* 138* 143*     No results found for this or any previous visit (from the past 240 hour(s)).       Radiology Studies: DG Chest Port 1  View  Result Date: 01/01/2020 CLINICAL  DATA:  COVID-19 positive, hypoxia EXAM: PORTABLE CHEST 1 VIEW COMPARISON:  12/30/2019 FINDINGS: Single frontal view of the chest demonstrates stable dual lead pacer. Cardiac silhouette is unremarkable. Multifocal bilateral airspace disease is unchanged. No effusion or pneumothorax. IMPRESSION: 1. Stable bilateral multifocal pneumonia. Electronically Signed   By: Randa Ngo M.D.   On: 01/01/2020 16:09   DG Abd Portable 2V  Result Date: 01/03/2020 CLINICAL DATA:  Nausea vomiting, weakness. EXAM: PORTABLE ABDOMEN - 2 VIEW COMPARISON:  01/01/2020 FINDINGS: There is a left chest wall pacer device with lead in the right atrial appendage and right ventricle. The bowel gas pattern appears nonobstructive. No dilated small bowel loops. Gas and stool noted within the colon. Degenerative disc disease identified within the lumbar spine. Mild bilateral hip osteoarthritis also noted. IMPRESSION: Nonobstructive bowel gas pattern. Electronically Signed   By: Kerby Moors M.D.   On: 01/03/2020 09:55     Scheduled Meds:  apixaban  2.5 mg Oral BID   atorvastatin  80 mg Oral QHS   benzonatate  100 mg Oral TID   diltiazem  360 mg Oral Daily   feeding supplement (ENSURE ENLIVE)  237 mL Oral TID BM   furosemide  80 mg Intravenous Q12H   insulin aspart  0-20 Units Subcutaneous TID WC   insulin aspart  0-5 Units Subcutaneous QHS   insulin glargine  40 Units Subcutaneous Daily   levothyroxine  50 mcg Oral Daily   linagliptin  5 mg Oral Daily   metoprolol succinate  25 mg Oral Daily   polyethylene glycol  17 g Oral BID   predniSONE  40 mg Oral Q breakfast   senna  2 tablet Oral BID   sodium bicarbonate  650 mg Oral TID   Continuous Infusions:  sodium chloride 10 mL/hr at 12/31/19 0236     LOS: 11 days   Bonnielee Haff, MD [facility]   To contact the attending provider between 7A-7P or the covering provider during after hours 7P-7A,  please log into the web site [email or web address] and access using universal [place] password for that web site. If you do not have the password, please call the hospital operator.  01/03/2020, 1:18 PM

## 2020-01-03 NOTE — Progress Notes (Signed)
[facility] Progress Note    Assessment/ Plan:   Non-Oliguric AKI (stable) on CKD 4: Likely secondary to COVID, possibly intravascularly depleted at time of consult. Admit creat 1.7 on 8/12, peak creat 3.6 on 8/17, now creat declining.   -baseline Cr around 1.8-2.2. Follows with Dr. Kern Alberta at [facility]. Underlying CKD secondary to longstanding DM and HTN - no obstruction on renal ultrasound: cortical thinning+ medical renal disease bilat - B/Cr improving slowly, no indication for renal replacement therapy at this time - diuresed well yest on 80mg  IV lasix bid > will give another 80 mg x 1 tonight  COVID-19 Pneumonia/AHRF -sp remdesivir, steroids. Completed empiric abx -mgmt per primary service. On supplemental O2  Anasarca/ hypoalbuminemia  -persistent -likely third spacing from hypoalbuminemia   Non-gap Metabolic Acidosis, improved -corrected anion gap 15 -likely related to AKI   Hypertension: -on toprol xl and cardizem  Afib -renally dose eliquis -rate control per primary service  Uncontrolled Diabetes Mellitus Type 2 with Hyperglycemia -mgmt per primary service   Kelly Splinter, MD 01/03/2020, 4:11 PM       Subjective:    Patient not examined today directly given COVID-19 + status, utilizing data taken from chart +/- discussions w/ providers and staff.    3 L UOP yest Objective:   BP (!) 157/91 (BP Location: Right Arm)   Pulse 88   Temp 97.8 F (36.6 C) (Oral)   Resp (!) 22   Ht 5\' 3"  (1.6 m)   Wt 104.3 kg   SpO2 (!) 88%   BMI 40.73 kg/m   Intake/Output Summary (Last 24 hours) at 01/03/2020 1611 Last data filed at 01/03/2020 6387 Gross per 24 hour  Intake --  Output 2000 ml  Net -2000 ml   Weight change:   Physical Exam:  Patient not examined today directly given COVID-19 + status, utilizing data taken from chart +/- discussions w/ providers and staff.    Imaging: DG Abd Portable 2V  Result Date: 01/03/2020 CLINICAL DATA:   Nausea vomiting, weakness. EXAM: PORTABLE ABDOMEN - 2 VIEW COMPARISON:  01/01/2020 FINDINGS: There is a left chest wall pacer device with lead in the right atrial appendage and right ventricle. The bowel gas pattern appears nonobstructive. No dilated small bowel loops. Gas and stool noted within the colon. Degenerative disc disease identified within the lumbar spine. Mild bilateral hip osteoarthritis also noted. IMPRESSION: Nonobstructive bowel gas pattern. Electronically Signed   By: Kerby Moors M.D.   On: 01/03/2020 09:55    Labs: BMET Recent Labs  Lab 12/28/19 0412 12/29/19 0421 12/30/19 0340 12/31/19 0436 01/01/20 0628 01/02/20 0253 01/03/20 0453  NA 130* 132* 136 138 138 141 141  K 4.0 4.0 4.5 4.8 5.0 4.6 5.0  CL 99 103 106 107 104 104 100  CO2 18* 19* 20* 19* 22 22 26   GLUCOSE 327* 214* 104* 88 195* 205* 151*  BUN 107* 106* 93* 114* 114* 113* 117*  CREATININE 3.56* 3.48* 3.49* 3.22* 3.03* 2.72* 2.63*  CALCIUM 7.4* 7.3* 7.4* 7.9* 8.9 9.4 9.4   CBC Recent Labs  Lab 12/28/19 0412 01/02/20 0253  WBC 13.2* 23.2*  NEUTROABS 12.2*  --   HGB 12.7 13.3  HCT 37.5 40.1  MCV 92.8 95.2  PLT 318 304    Medications:    . apixaban  2.5 mg Oral BID  . atorvastatin  80 mg Oral QHS  . benzonatate  100 mg Oral TID  . diltiazem  360 mg Oral Daily  . feeding  supplement (ENSURE ENLIVE)  237 mL Oral TID BM  . insulin aspart  0-20 Units Subcutaneous TID WC  . insulin aspart  0-5 Units Subcutaneous QHS  . insulin glargine  40 Units Subcutaneous Daily  . iohexol  500 mL Oral Q1H  . iohexol      . levothyroxine  50 mcg Oral Daily  . linagliptin  5 mg Oral Daily  . metoprolol succinate  25 mg Oral Daily  . polyethylene glycol  17 g Oral BID  . predniSONE  40 mg Oral Q breakfast  . senna  2 tablet Oral BID  . sodium bicarbonate  650 mg Oral TID

## 2020-01-03 NOTE — Progress Notes (Signed)
Fleet enema administered and pt tolerated well. No BM at the moment, instructed the pt to call staff if she feels the urge for BM.

## 2020-01-03 NOTE — Progress Notes (Signed)
Pt has been nauseated, unable to tolerate oral contrast. Pt tried to sip on it and vomited a little bit. MD notified. Phenergan 12.5 mg IV given as ordered. CT scan notified. Will continue to monitor.

## 2020-01-03 NOTE — NC FL2 (Signed)
[place] LEVEL OF CARE SCREENING TOOL     IDENTIFICATION  Patient Name: Kathryn Richardson Birthdate: [birth year] Sex: female Admission Date (Current Location): 12/16/2019  [facility] [place] Number:  Herbalist and Address:  [facility],  [place], [place]      Provider Number: [id number]  Attending Physician Name and Address:  Bonnielee Haff, MD  Relative Name and Phone Number:  [place], [place] Daughter [phone]  [phone] or    Current Level of Care: SNF Recommended Level of Care: [place] Prior Approval Number:    Date Approved/Denied:   PASRR Number: [id number]  Discharge Plan: SNF    Current Diagnoses: Patient Active Problem List   Diagnosis Date Noted  . Pneumonia due to COVID-19 virus 12/24/2019  . Acute respiratory failure with hypoxia ([place]) 01/02/2020  . Sinus node dysfunction ([place]) 06/23/2019  . Status post dilation of esophageal narrowing 07/08/2016  . Visit for monitoring Tikosyn therapy 06/16/2016  . Pacemaker 08/01/2014  . Uncontrolled diabetes mellitus ([place]) 05/26/2014  . AKI (acute kidney injury) ([place]) 05/26/2014  . Essential hypertension 05/26/2014  . Hyperkalemia 05/26/2014  . GERD (gastroesophageal reflux disease) 05/26/2014  . Hypothyroidism 05/26/2014  . Gout 04/26/2014  . Atrial fibrillation with rapid ventricular response ([place]) 04/26/2014  . Syncope 04/25/2014  . Atrial flutter with rapid ventricular response ([place]) 04/25/2014  . Chronic kidney disease, stage III (moderate) 10/05/2013  . Encounter for therapeutic drug monitoring 06/06/2013  . Essential hypertension, benign 03/25/2013  . Long term current use of anticoagulant therapy 03/25/2013  . Hyperlipidemia 03/25/2013  . Paroxysmal atrial fibrillation 09/03/2011  . Bradycardia 09/03/2011  . Allergic rhinitis 02/09/2011  . Diabetes mellitus ([place]) 02/09/2011  . Vitamin D deficiency 02/09/2011    Orientation  RESPIRATION BLADDER Height & Weight     Self, Time, Situation, Place  O2 (7L) Continent Weight: 229 lb 15 oz (104.3 kg) Height:  5\' 3"  (160 cm)  BEHAVIORAL SYMPTOMS/MOOD NEUROLOGICAL BOWEL NUTRITION STATUS      Continent Diet (Regular diet)  AMBULATORY STATUS COMMUNICATION OF NEEDS Skin   Limited Assist Verbally Normal                       Personal Care Assistance Level of Assistance  Bathing, Feeding, Dressing Bathing Assistance: Limited assistance Feeding assistance: Independent Dressing Assistance: Limited assistance     Functional Limitations Info  Sight, Hearing, Speech Sight Info: Adequate Hearing Info: Adequate Speech Info: Adequate    SPECIAL CARE FACTORS FREQUENCY  PT (By licensed PT), OT (By licensed OT)     PT Frequency: Minimum 5x a week OT Frequency: Minimum 5x a week            Contractures Contractures Info: Not present    Additional Factors Info  Code Status, Allergies, Insulin Sliding Scale, Isolation Precautions Code Status Info: Full Code Allergies Info: Cortisone Prednisone   Insulin Sliding Scale Info: insulin aspart (novoLOG) injection 0-20 Units 3x a day with meals. Isolation Precautions Info: Covid+     Current Medications (01/03/2020):  This is the current hospital active medication list Current Facility-Administered Medications  Medication Dose Route Frequency Provider Last Rate Last Admin  . 0.9 %  sodium chloride infusion   Intravenous PRN Zierle-Ghosh, Asia B, DO 10 mL/hr at 12/31/19 0236 New Bag at 12/31/19 0236  . acetaminophen (TYLENOL) tablet 650 mg  650 mg Oral Q6H PRN Zierle-Ghosh, Asia B, DO      .  albuterol (VENTOLIN HFA) 108 (90 Base) MCG/ACT inhaler 2 puff  2 puff Inhalation Q4H PRN Zierle-Ghosh, Asia B, DO      . ALPRAZolam (XANAX) tablet 0.25 mg  0.25 mg Oral BID PRN Bonnielee Haff, MD      . apixaban Arne Cleveland) tablet 2.5 mg  2.5 mg Oral BID Zierle-Ghosh, Asia B, DO   2.5 mg at 01/03/20 0848  . atorvastatin (LIPITOR)  tablet 80 mg  80 mg Oral QHS Zierle-Ghosh, Asia B, DO   80 mg at 01/02/20 2136  . benzonatate (TESSALON) capsule 100 mg  100 mg Oral TID Bonnielee Haff, MD   100 mg at 01/03/20 0848  . chlorpheniramine-HYDROcodone (TUSSIONEX) 10-8 MG/5ML suspension 5 mL  5 mL Oral Q12H PRN Zierle-Ghosh, Asia B, DO   5 mL at 01/03/20 0848  . diltiazem (CARDIZEM CD) 24 hr capsule 360 mg  360 mg Oral Daily Zierle-Ghosh, Asia B, DO   360 mg at 01/03/20 0846  . diltiazem (CARDIZEM) tablet 30 mg  30 mg Oral Q4H PRN Zierle-Ghosh, Asia B, DO   30 mg at 01/02/20 2134  . feeding supplement (ENSURE ENLIVE) (ENSURE ENLIVE) liquid 237 mL  237 mL Oral TID BM Bonnielee Haff, MD   237 mL at 01/02/20 2135  . guaiFENesin-dextromethorphan (ROBITUSSIN DM) 100-10 MG/5ML syrup 10 mL  10 mL Oral Q4H PRN Zierle-Ghosh, Asia B, DO   10 mL at 01/01/20 2145  . hydrALAZINE (APRESOLINE) injection 10 mg  10 mg Intravenous Q6H PRN Bonnielee Haff, MD      . insulin aspart (novoLOG) injection 0-20 Units  0-20 Units Subcutaneous TID WC Elodia Florence., MD   3 Units at 01/03/20 1520  . insulin aspart (novoLOG) injection 0-5 Units  0-5 Units Subcutaneous QHS Elodia Florence., MD   3 Units at 01/01/20 2148  . insulin glargine (LANTUS) injection 40 Units  40 Units Subcutaneous Daily Bonnielee Haff, MD      . iohexol (OMNIPAQUE) 9 MG/ML oral solution 500 mL  500 mL Oral Q1H Bonnielee Haff, MD   500 mL at 01/03/20 1517  . iohexol (OMNIPAQUE) 9 MG/ML oral solution           . levothyroxine (SYNTHROID) tablet 50 mcg  50 mcg Oral Daily Elodia Florence., MD   50 mcg at 01/03/20 [id number]  . linagliptin (TRADJENTA) tablet 5 mg  5 mg Oral Daily Elodia Florence., MD   5 mg at 01/03/20 0848  . metoprolol succinate (TOPROL-XL) 24 hr tablet 25 mg  25 mg Oral Daily Zierle-Ghosh, Asia B, DO   25 mg at 01/03/20 0847  . ondansetron (ZOFRAN) tablet 4 mg  4 mg Oral Q6H PRN Zierle-Ghosh, Asia B, DO   4 mg at 12/28/19 0928   Or  . ondansetron  (ZOFRAN) injection 4 mg  4 mg Intravenous Q6H PRN Zierle-Ghosh, Asia B, DO   4 mg at 01/03/20 1059  . oxyCODONE (Oxy IR/ROXICODONE) immediate release tablet 5 mg  5 mg Oral Q4H PRN Zierle-Ghosh, Asia B, DO      . polyethylene glycol (MIRALAX / GLYCOLAX) packet 17 g  17 g Oral BID Bonnielee Haff, MD   17 g at 01/02/20 1053  . predniSONE (DELTASONE) tablet 40 mg  40 mg Oral Q breakfast Bonnielee Haff, MD   40 mg at 01/03/20 0847  . senna (SENOKOT) tablet 17.2 mg  2 tablet Oral BID Bonnielee Haff, MD   17.2 mg at 01/02/20 1051  . sodium bicarbonate  tablet 650 mg  650 mg Oral TID Elodia Florence., MD   650 mg at 01/02/20 2136  . traZODone (DESYREL) tablet 25 mg  25 mg Oral QHS PRN Zierle-Ghosh, Asia B, DO   25 mg at 12/27/19 2209     Discharge Medications: Please see discharge summary for a list of discharge medications.  Relevant Imaging Results:  Relevant Lab Results:   Additional Information SSN [id number]  Ross Ludwig, LCSW

## 2020-01-03 NOTE — TOC Progression Note (Signed)
Transition of Care [facility]) - Progression Note    Patient Details  Name: Kathryn Richardson MRN: [id number] Date of Birth: 1934/07/08  Transition of Care [facility] CM/SW Contact  Ross Ludwig, [place] Phone Number: 01/03/2020, 5:39 PM  Clinical Narrative:     CSW spoke to patient's daughter, she would like SNF since they are not able to provide 24 hour supervision.  CSW explained to her process of looking for SNF, and what to expect.  Patient did received Covid vaccine.  Patient's daughter gave CSW permission to begin bed search in [place].  CSW to continue to follow patient's progress throughout discharge planning.        Expected Discharge Plan and Services    SNF for short term rehab.                                             Social Determinants of Health (SDOH) Interventions    Readmission Risk Interventions No flowsheet data found.

## 2020-01-03 NOTE — Progress Notes (Signed)
Pt had a large soft BM post enema.

## 2020-01-04 LAB — GLUCOSE, CAPILLARY
Glucose-Capillary: 81 mg/dL (ref 70–99)
Glucose-Capillary: 99 mg/dL (ref 70–99)
Glucose-Capillary: 76 mg/dL (ref 70–99)
Glucose-Capillary: 156 mg/dL — ABNORMAL HIGH (ref 70–99)

## 2020-01-04 LAB — BASIC METABOLIC PANEL
Anion gap: 15 (ref 5–15)
BUN: 127 mg/dL — ABNORMAL HIGH (ref 8–23)
CO2: 24 mmol/L (ref 22–32)
Calcium: 8.7 mg/dL — ABNORMAL LOW (ref 8.9–10.3)
Chloride: 104 mmol/L (ref 98–111)
Creatinine, Ser: 3.14 mg/dL — ABNORMAL HIGH (ref 0.44–1.00)
GFR calc Af Amer: 15 mL/min — ABNORMAL LOW (ref >60)
GFR calc non Af Amer: 13 mL/min — ABNORMAL LOW (ref >60)
Glucose, Bld: 81 mg/dL (ref 70–99)
Potassium: 4.6 mmol/L (ref 3.5–5.1)
Sodium: 143 mmol/L (ref 135–145)

## 2020-01-04 LAB — CBC
HCT: 39.7 % (ref 36.0–46.0)
Hemoglobin: 13.2 g/dL (ref 12.0–15.0)
MCH: 31.7 pg (ref 26.0–34.0)
MCHC: 33.2 g/dL (ref 30.0–36.0)
MCV: 95.2 fL (ref 80.0–100.0)
Platelets: 165 10*3/uL (ref 150–400)
RBC: 4.17 MIL/uL (ref 3.87–5.11)
RDW: 15.9 % — ABNORMAL HIGH (ref 11.5–15.5)
WBC: 32.3 10*3/uL — ABNORMAL HIGH (ref 4.0–10.5)
nRBC: 0 % (ref 0.0–0.2)

## 2020-01-04 NOTE — TOC Initial Note (Signed)
Transition of Care [facility]) - Initial/Assessment Note    Patient Details  Name: Kathryn Richardson MRN: [id number] Date of Birth: [birth year]  Transition of Care [facility]) CM/SW Contact:    Ross Ludwig, LCSW Phone Number: 01/04/2020, 3:58 PM  Clinical Narrative:                  Patient is an 85 year old female who is married and lives with her husband.  Patient is Covid+ and has had a vaccine, patient has not been to rehab before.  CSW spoke to patient's daughter via phone, because patient could not hear CSW via phone.  CSW explained to patient's daughter what to expect and how insurance will pay for stay.  Patient's daughter asked about visitation policy, CSW informed her that per facility, they are not allowing any visitors, but are doing face time and window visits.  CSW relayed this information to patient's daughter, she expressed understanding.  CSW presented bed offers to the daughter, and they chose [place] for short term rehab.  CSW spoke to [place] at [facility], and she stated they have one covid bed left and will hold it for patient.  CSW updated patient's family.  Expected Discharge Plan: Skilled Nursing Facility Barriers to Discharge: Continued Medical Work up   Patient Goals and CMS Choice Patient states their goals for this hospitalization and ongoing recovery are:: To go to SNF for short term rehab, then return back home. CMS Medicare.gov Compare Post Acute Care list provided to:: Patient Represenative (must comment) Choice offered to / list presented to : Adult Children  Expected Discharge Plan and Services Expected Discharge Plan: [place] In-house Referral: NA   Post Acute Care Choice: [place] Living arrangements for the past 2 months: Single Family Home                   DME Agency: NA                  Prior Living Arrangements/Services Living arrangements for the past 2 months: Single Family Home Lives with::  Spouse Patient language and need for interpreter reviewed:: Yes Do you feel safe going back to the place where you live?: No   Patient's family feels she needs some therapy before she is able to go home.  Need for Family Participation in Patient Care: Yes (Comment) Care giver support system in place?: No (comment)   Criminal Activity/Legal Involvement Pertinent to Current Situation/Hospitalization: No - Comment as needed  Activities of Daily Living Home Assistive Devices/Equipment: Eyeglasses ADL Screening (condition at time of admission) Patient's cognitive ability adequate to safely complete daily activities?: Yes Is the patient deaf or have difficulty hearing?: No Does the patient have difficulty seeing, even when wearing glasses/contacts?: No Does the patient have difficulty concentrating, remembering, or making decisions?: Yes Patient able to express need for assistance with ADLs?: No Does the patient have difficulty dressing or bathing?: No Independently performs ADLs?: Yes (appropriate for developmental age) Does the patient have difficulty walking or climbing stairs?: Yes Weakness of Legs: Both Weakness of Arms/Hands: Both  Permission Sought/Granted Permission sought to share information with : Case Manager, Customer service manager, Family Supports Permission granted to share information with : Yes, Verbal Permission Granted  Share Information with NAME: Shamikia, [place] Daughter [phone]  [phone]  Permission granted to share info w AGENCY: SNF admissions        Emotional Assessment Appearance:: Appears stated age  Orientation: : Oriented to Self, Oriented to  Time, Oriented to Place, Oriented to Situation Alcohol / Substance Use: Not Applicable Psych Involvement: No (comment)  Admission diagnosis:  Acute respiratory failure with hypoxia ([place]) [J96.01] Pneumonia due to COVID-19 virus [U07.1, J12.82] COVID-19 [U07.1] Patient Active Problem List    Diagnosis Date Noted  . Pneumonia due to COVID-19 virus 12/24/2019  . Acute respiratory failure with hypoxia ([place]) 12/14/2019  . Sinus node dysfunction ([place]) 06/23/2019  . Status post dilation of esophageal narrowing 07/08/2016  . Visit for monitoring Tikosyn therapy 06/16/2016  . Pacemaker 08/01/2014  . Uncontrolled diabetes mellitus ([place]) 05/26/2014  . AKI (acute kidney injury) ([place]) 05/26/2014  . Essential hypertension 05/26/2014  . Hyperkalemia 05/26/2014  . GERD (gastroesophageal reflux disease) 05/26/2014  . Hypothyroidism 05/26/2014  . Gout 04/26/2014  . Atrial fibrillation with rapid ventricular response ([place]) 04/26/2014  . Syncope 04/25/2014  . Atrial flutter with rapid ventricular response ([place]) 04/25/2014  . Chronic kidney disease, stage III (moderate) 10/05/2013  . Encounter for therapeutic drug monitoring 06/06/2013  . Essential hypertension, benign 03/25/2013  . Long term current use of anticoagulant therapy 03/25/2013  . Hyperlipidemia 03/25/2013  . Paroxysmal atrial fibrillation 09/03/2011  . Bradycardia 09/03/2011  . Allergic rhinitis 02/09/2011  . Diabetes mellitus ([place]) 02/09/2011  . Vitamin D deficiency 02/09/2011   PCP:  [name], MD Pharmacy:   [id number] - [place], [address] [place] [address] Phone: [phone] Fax: [phone]     Social Determinants of Health (SDOH) Interventions    Readmission Risk Interventions No flowsheet data found.

## 2020-01-04 NOTE — Plan of Care (Signed)
  Problem: Education: Goal: Knowledge of risk factors and measures for prevention of condition will improve Outcome: Progressing   Problem: Coping: Goal: Psychosocial and spiritual needs will be supported Outcome: Progressing   Problem: Respiratory: Goal: Will maintain a patent airway Outcome: Progressing Goal: Complications related to the disease process, condition or treatment will be avoided or minimized Outcome: Progressing   

## 2020-01-04 NOTE — Progress Notes (Signed)
  [facility] Progress Note    Assessment/ Plan:   Non-Oliguric AKI (stable) on CKD 4: -baseline Cr around 1.8-2.2. F/b [facility].  Underlying CKD secondary to longstanding DM and HTN.  AKI Likely secondary to COVID infection, possibly intravascularly dehydration. Admit creat 1.7 on 8/12, peak creat 3.6 on 8/17.    - no obstruction on renal ultrasound: cortical thinning+ medical renal disease bilat - diuresed x 48hrs for ^'d SOB and gross vol overload, breathing improved but B/Cr rising today so will hold lasix for now  COVID-19 Pneumonia/AHRF -sp remdesivir, steroids. Completed empiric abx -mgmt per primary service. On supplemental O2  Anasarca/ hypoalbuminemia  -persistent -likely third spacing from hypoalbuminemia   Non-gap Metabolic Acidosis, improved -corrected anion gap 15 -likely related to AKI   Hypertension: -on toprol xl and cardizem  Afib -renally dose eliquis -rate control per primary service  Uncontrolled Diabetes Mellitus Type 2 with Hyperglycemia -mgmt per primary service   Kathryn Splinter, MD 01/04/2020, 4:16 PM       Subjective:    seen in room, states breathing  Better, 2.5 L UOP yest w/ IV lasix  Physical Exam: alert, nad   no jvd  Chest cta bilat  Cor reg no RG  Abd soft ntnd no ascites   Ext diffuse 2-3+ edema, slightly better   Alert, NF, ox3    3 L UOP yest Objective:   BP 122/70 (BP Location: Right Arm)   Pulse 98   Temp 97.6 F (36.4 C) (Oral)   Resp 20   Ht 5\' 3"  (1.6 m)   Wt 104.3 kg   SpO2 (!) 89%   BMI 40.73 kg/m  No intake or output data in the 24 hours ending 01/04/20 1616 Weight change:       Labs: BMET Recent Labs  Lab 12/29/19 0421 12/30/19 0340 12/31/19 0436 01/01/20 0628 01/02/20 0253 01/03/20 0453 01/04/20 0358  NA 132* 136 138 138 141 141 143  K 4.0 4.5 4.8 5.0 4.6 5.0 4.6  CL 103 106 107 104 104 100 104  CO2 19* 20* 19* 22 22 26 24   GLUCOSE 214* 104* 88 195* 205* 151* 81  BUN  106* 93* 114* 114* 113* 117* 127*  CREATININE 3.48* 3.49* 3.22* 3.03* 2.72* 2.63* 3.14*  CALCIUM 7.3* 7.4* 7.9* 8.9 9.4 9.4 8.7*   CBC Recent Labs  Lab 01/02/20 0253 01/04/20 0358  WBC 23.2* 32.3*  HGB 13.3 13.2  HCT 40.1 39.7  MCV 95.2 95.2  PLT 304 165    Medications:    . atorvastatin  80 mg Oral QHS  . benzonatate  100 mg Oral TID  . diltiazem  360 mg Oral Daily  . feeding supplement (ENSURE ENLIVE)  237 mL Oral TID BM  . insulin aspart  0-20 Units Subcutaneous TID WC  . insulin aspart  0-5 Units Subcutaneous QHS  . insulin glargine  40 Units Subcutaneous Daily  . levothyroxine  50 mcg Oral Daily  . linagliptin  5 mg Oral Daily  . metoprolol succinate  25 mg Oral Daily  . predniSONE  40 mg Oral Q breakfast  . sodium bicarbonate  650 mg Oral TID

## 2020-01-04 NOTE — Progress Notes (Signed)
Patient had maroon colored BM type 6, twice. Provider aware. Will continue to monitor.

## 2020-01-04 NOTE — Progress Notes (Signed)
PROGRESS NOTE  Kathryn Richardson  DOB: 07/25/1934  PCP: [name], MD LGX:[id number]  DOA: 01/07/2020  LOS: 12 days   Chief Complaint  Patient presents with   Covid positive   Brief narrative: Kathryn Richardson is a 85 y.o. female with PMH of DM2, HTN, HLD, CKD 4, A. fib, GERD. Patient presented to the ED on 01/02/2020 with worsening COVID symptoms for 7 to 10 days. On 8/5, patient was diagnosed with Covid PCR positive.  She had mild symptoms refuted at the time.   8/10, patient had monoclonal antibody infusion RegenCov. Symptoms continue to worsen and patient presented to the ED on 8/12. Of note, patient got vaccinated in February 2021.  In the ED, patient was noted to be hypoxic, required 6 L oxygen by nasal cannula. Chest x-ray on admission showed bilateral pneumonia. She was admitted to hospitalist service for further evaluation management.  Subjective: Patient was seen and examined this morning.  Looks tired. Propped up in bed.  Oriented to place.  Slow to respond. Chart reviewed. No fever, heart rate fluctuating between 55-108, blood pressure fluctuating between 111 and 180 in 24 hours. On 11 L high flow oxygen by nasal cannula with oxygen saturation in high 80s.  Labs this morning with worsening BUN/creatinine to 127/3.14. WBC count worsening to 32.3 CT chest, abdomen and pelvis was obtained on 8/24 which showed 1. Bilateral geographic ground-glass opacities within both lungs secondary to Covid pneumonia.  2. Generalized pulmonary edema and small volume abdominopelvic ascites due to third spacing.  3. Possible high-density contents in the gallbladder which may represent sludge. 4. Colon with liquid stool  Assessment/Plan: Breakthrough COVID pneumonia Acute respiratory failure with hypoxia  -Presented with worsening shortness of breath. Vaccinated in February 2021 -COVID test: PCR positive as an outpatient on 8/5. -Chest imaging: Chest x-ray on admission showed  bilateral pneumonia. -Patient completed the course of IV remdesivir and steroids as well as 5-day course of antibiotics due to elevated procalcitonin. -Was not given Actemra or baricitinib due to concern for secondary bacterial infection. -CRP was noted to be elevated and improved.  D-dimer was never significantly elevated. -Despite standard treatment, patient continues to remain hypoxic and is currently on 11 L high flow nasal cannula. -CT chest repeated on 8/24 continu    es to show persistent bilateral pneumonia. -Currently remains on prednisone 40 mg daily.  Likely because of secondary leukocytosis.  -Oxygen - SpO2: (!) 87 % O2 Flow Rate (L/min): 11 L/min FiO2 (%): 100 % -Continue airborne/contact isolation precautions. -WBC and inflammatory markers trend as below.  Lab Results  Component Value Date   SARSCOV2NAA POSITIVE (A) 12/14/2019   [place] NEGATIVE 08/13/2019    Recent Labs  Lab 12/29/19 0421 12/30/19 0340 12/31/19 0436 01/02/20 0253 01/04/20 0358  WBC  --   --   --  23.2* 32.3*  CRP  --  2.7*  --   --   --   ALT 19 18 22   --   --    The treatment plan and use of medications and known side effects were discussed with patient/family. Some of the medications used are based on case reports/anecdotal data.  All other medications being used in the management of COVID-19 based on limited study data.  Complete risks and long-term side effects are unknown, however in the best clinical judgment they seem to be of some benefit.  Patient wanted to proceed with treatment options provided.  AKI on CKD IV Normal anion gap metabolic acidosis  Hyponatremia -Patient's baseline creatinine is around 1.7-2.2.   -Presented with baseline renal function but has worsened since admission. Off ACEI. No Lasix in last 48 hours.  -Patient continues to have worsening renal function. She has generalized edema as well. -Nephrology following. -Previously done renal ultrasound did not show any  hydronephrosis.   -Remains on oral sodium bicarbonate for metabolic acidosis which has improved. Recent Labs    12/27/19 0441 12/27/19 1553 12/28/19 0412 12/29/19 0421 12/30/19 0340 12/31/19 0436 01/01/20 0628 01/02/20 0253 01/03/20 0453 01/04/20 0358  CREATININE 3.60* 3.78* 3.56* 3.48* 3.49* 3.22* 3.03* 2.72* 2.63* 3.14*   Acute diarrhea Maroon-colored stool -Patient apparently had abdominal pain and constipation and hence she was started on Senokot and scheduled MiraLAX.  Currently she is having multiple episodes of diarrhea with some of them maroon-colored.  -Liquid stool was noted in CT scan obtained on 8/24. -Stop MiraLAX, Senokot. -Continue to monitor. -Continue to monitor hemoglobin. Recent Labs    08/15/19 0812 01/01/2020 2207 12/17/2019 2247 12/24/19 0502 12/25/19 0507 12/26/19 0448 12/27/19 0441 12/28/19 0412 01/02/20 0253 01/04/20 0358  HGB 12.6 14.8 14.6 14.6 13.5 13.2 13.1 12.7 13.3 13.2    Diabetes mellitus type 2, uncontrolled with hyperglycemia -A1c 10.4 -Initially hyperglycemia secondary to steroids. Now sugar level is mostly in target range.  -Has poor oral intake as well. Currently on Lantus 40 units and sliding scale insulin with Accu-Cheks. Recent Labs  Lab 01/03/20 0749 01/03/20 1104 01/03/20 1640 01/03/20 2102 01/04/20 0828  GLUCAP 138* 143* 120* 116* 81   History of paroxysmal atrial fibrillation -Continue Cardizem, metoprolol -Heart rate fluctuating within target range. -Hold Eliquis because of maroon-colored stool.  Hyperlipidemia -Continue Lipitor  Anxiety Secondary to acute illness. As needed Xanax  Mobility: PT eval obtained. Home health PT versus SNF Code Status:   Code Status: Full Code  Nutritional status: Body mass index is 40.73 kg/m.     Diet Order            Diet heart healthy/carb modified Room service appropriate? Yes; Fluid consistency: Thin; Fluid restriction: 1200 mL Fluid  Diet effective now                   DVT prophylaxis: apixaban (ELIQUIS) tablet 2.5 mg Start: 12/23/19 2200 SCDs Start: 12/23/19 2158 apixaban (ELIQUIS) tablet 2.5 mg   Antimicrobials:  None Fluid: None  Consultants: Nephrology Family Communication: : Called and updated patient's daughter Ms. Alvino Chapel this afternoon.  Status is: Inpatient  Remains inpatient appropriate because:Hemodynamically unstable, Ongoing diagnostic testing needed not appropriate for outpatient work up and IV treatments appropriate due to intensity of illness or inability to take PO   Dispo:  Patient From: [place]  Planned Disposition: [place]  Expected discharge date: 01/05/20  Medically stable for discharge: No   Infusions:   sodium chloride 10 mL/hr at 12/31/19 0236    Scheduled Meds:  apixaban  2.5 mg Oral BID   atorvastatin  80 mg Oral QHS   benzonatate  100 mg Oral TID   diltiazem  360 mg Oral Daily   feeding supplement (ENSURE ENLIVE)  237 mL Oral TID BM   insulin aspart  0-20 Units Subcutaneous TID WC   insulin aspart  0-5 Units Subcutaneous QHS   insulin glargine  40 Units Subcutaneous Daily   levothyroxine  50 mcg Oral Daily   linagliptin  5 mg Oral Daily   metoprolol succinate  25 mg Oral Daily   polyethylene glycol  17  g Oral BID   predniSONE  40 mg Oral Q breakfast   senna  2 tablet Oral BID   sodium bicarbonate  650 mg Oral TID    Antimicrobials: Anti-infectives (From admission, onward)   Start     Dose/Rate Route Frequency Ordered Stop   12/24/19 1000  cefTRIAXone (ROCEPHIN) 2 g in sodium chloride 0.9 % 100 mL IVPB        2 g 200 mL/hr over 30 Minutes Intravenous Every 24 hours 12/24/19 0843 12/28/19 1001   12/24/19 1000  azithromycin (ZITHROMAX) 500 mg in sodium chloride 0.9 % 250 mL IVPB        500 mg 250 mL/hr over 60 Minutes Intravenous Every 24 hours 12/24/19 0843 12/28/19 1104   12/23/19 1000  remdesivir 100 mg in sodium chloride 0.9 % 100 mL IVPB        "Followed by" Linked Group Details   100 mg 200 mL/hr over 30 Minutes Intravenous Daily 12/20/2019 2317 12/26/19 0842   12/23/19 0001  remdesivir 100 mg in sodium chloride 0.9 % 100 mL IVPB       "Followed by" Linked Group Details   100 mg 200 mL/hr over 30 Minutes Intravenous  Once 12/25/2019 2317 12/23/19 0224   12/23/2019 2330  remdesivir 100 mg in sodium chloride 0.9 % 100 mL IVPB       "Followed by" Linked Group Details   100 mg 200 mL/hr over 30 Minutes Intravenous  Once 12/16/2019 2317 12/23/19 0052      PRN meds: sodium chloride, acetaminophen, albuterol, ALPRAZolam, chlorpheniramine-HYDROcodone, diltiazem, guaiFENesin-dextromethorphan, hydrALAZINE, ondansetron **OR** ondansetron (ZOFRAN) IV, oxyCODONE, traZODone   Objective: Vitals:   01/04/20 0426 01/04/20 0536  BP: (!) 140/127 (!) 116/56  Pulse: (!) 57 91  Resp: 20 20  Temp: (!) 97.4 F (36.3 C)   SpO2: (!) 87% (!) 83%   No intake or output data in the 24 hours ending 01/04/20 0833 Filed Weights   12/31/19 0544 01/01/20 0500 01/02/20 0514  Weight: 105.8 kg 105 kg 104.3 kg   Weight change:  Body mass index is 40.73 kg/m.   Physical Exam: General exam: Looks lethargic.  Generalized anasarca present.  Not in physical distress though Skin: No rashes, lesions or ulcers. HEENT: Atraumatic, normocephalic, supple neck, no obvious bleeding Lungs: Diminished air entry on both sides. CVS: Regular rate and rhythm, no murmur GI/Abd soft, nontender, nondistended, bowel sound present CNS: Alert, awake, oriented to place Psychiatry: Depressed look Extremities: Generalized anasarca present  Data Review: I have personally reviewed the laboratory data and studies available.  Recent Labs  Lab 01/02/20 0253 01/04/20 0358  WBC 23.2* 32.3*  HGB 13.3 13.2  HCT 40.1 39.7  MCV 95.2 95.2  PLT 304 165   Recent Labs  Lab 12/31/19 0436 01/01/20 0628 01/02/20 0253 01/03/20 0453 01/04/20 0358  NA 138 138 141 141 143  K 4.8  5.0 4.6 5.0 4.6  CL 107 104 104 100 104  CO2 19* 22 22 26 24   GLUCOSE 88 195* 205* 151* 81  BUN 114* 114* 113* 117* 127*  CREATININE 3.22* 3.03* 2.72* 2.63* 3.14*  CALCIUM 7.9* 8.9 9.4 9.4 8.7*   Lab Results  Component Value Date   HGBA1C 10.4 (H) 12/25/2019       Component Value Date/Time   TRIG 109 12/27/2019 2207    Signed, Terrilee Croak, MD [facility] Pager: [phone] (Secure Chat preferred). 01/04/2020

## 2020-01-05 ENCOUNTER — Inpatient Hospital Stay (HOSPITAL_COMMUNITY): Payer: Medicare Other

## 2020-01-05 HISTORY — PX: IR US GUIDE VASC ACCESS RIGHT: IMG2390

## 2020-01-05 HISTORY — PX: IR FLUORO GUIDE CV LINE RIGHT: IMG2283

## 2020-01-05 LAB — CBC
HCT: 34 % — ABNORMAL LOW (ref 36.0–46.0)
Hemoglobin: 11.4 g/dL — ABNORMAL LOW (ref 12.0–15.0)
MCH: 31.9 pg (ref 26.0–34.0)
MCHC: 33.5 g/dL (ref 30.0–36.0)
MCV: 95.2 fL (ref 80.0–100.0)
Platelets: 124 10*3/uL — ABNORMAL LOW (ref 150–400)
RBC: 3.57 MIL/uL — ABNORMAL LOW (ref 3.87–5.11)
RDW: 15.8 % — ABNORMAL HIGH (ref 11.5–15.5)
WBC: 29.2 10*3/uL — ABNORMAL HIGH (ref 4.0–10.5)
nRBC: 0 % (ref 0.0–0.2)

## 2020-01-05 LAB — BASIC METABOLIC PANEL
Anion gap: 13 (ref 5–15)
BUN: 157 mg/dL — ABNORMAL HIGH (ref 8–23)
CO2: 24 mmol/L (ref 22–32)
Calcium: 8.1 mg/dL — ABNORMAL LOW (ref 8.9–10.3)
Chloride: 102 mmol/L (ref 98–111)
Creatinine, Ser: 3.8 mg/dL — ABNORMAL HIGH (ref 0.44–1.00)
GFR calc Af Amer: 12 mL/min — ABNORMAL LOW (ref >60)
GFR calc non Af Amer: 10 mL/min — ABNORMAL LOW (ref >60)
Glucose, Bld: 105 mg/dL — ABNORMAL HIGH (ref 70–99)
Potassium: 4.8 mmol/L (ref 3.5–5.1)
Sodium: 139 mmol/L (ref 135–145)

## 2020-01-05 LAB — GLUCOSE, CAPILLARY
Glucose-Capillary: 100 mg/dL — ABNORMAL HIGH (ref 70–99)
Glucose-Capillary: 139 mg/dL — ABNORMAL HIGH (ref 70–99)
Glucose-Capillary: 62 mg/dL — ABNORMAL LOW (ref 70–99)
Glucose-Capillary: 97 mg/dL (ref 70–99)

## 2020-01-05 MED ORDER — HEPARIN SODIUM (PORCINE) 1000 UNIT/ML IJ SOLN
INTRAMUSCULAR | Status: AC
  Filled 2020-01-05: qty 1

## 2020-01-05 MED ORDER — METHYLPREDNISOLONE SODIUM SUCC 125 MG IJ SOLR
40.0000 mg | Freq: Two times a day (BID) | INTRAMUSCULAR | Status: DC
  Administered 2020-01-05 – 2020-01-08 (×6): 40 mg via INTRAVENOUS
  Filled 2020-01-05 (×6): qty 2

## 2020-01-05 MED ORDER — LIDOCAINE-EPINEPHRINE 1 %-1:100000 IJ SOLN
INTRAMUSCULAR | Status: AC
  Filled 2020-01-05: qty 1

## 2020-01-05 MED ORDER — CHLORHEXIDINE GLUCONATE CLOTH 2 % EX PADS
6.0000 | MEDICATED_PAD | Freq: Every day | CUTANEOUS | Status: DC
  Administered 2020-01-06 – 2020-01-07 (×2): 6 via TOPICAL

## 2020-01-05 MED ORDER — LIDOCAINE HCL (PF) 1 % IJ SOLN
INTRAMUSCULAR | Status: DC | PRN
  Administered 2020-01-05: 10 mL

## 2020-01-05 NOTE — Consult Note (Signed)
   [facility] CM Inpatient Consult   01/05/2020  KIARIA QUINNELL May 09, 1935 [id number]  Patient screened for high risk score for unplanned readmission. Chart reviewed to assess for potential [place] Management community service needs. Per review, patient is being recommended for a skilled nursing facility level of care.    No [facility] Care Management follow up needs at this time.   Netta Cedars, MSN, [facility] Liaison Nurse Mobile Phone [phone]  Toll free office [phone]

## 2020-01-05 NOTE — Procedures (Signed)
Interventional Radiology Procedure Note  Procedure: rt ij temp hd cath    Complications: None  Estimated Blood Loss:  min  Findings: Tip svcra    Tamera Punt, MD

## 2020-01-05 NOTE — Progress Notes (Signed)
Face Time Call placed to patients daughter Suzi Roots.

## 2020-01-05 NOTE — Progress Notes (Signed)
  [facility] Progress Note    Assessment/ Plan:   Non-Oliguric AKI (stable) on CKD 4: -baseline Cr around 1.8-2.2. F/b [facility].  Underlying CKD secondary to longstanding DM and HTN.  AKI Likely secondary to COVID infection and /or intravascular dehydration. Admit creat 1.7 on 8/12.    - no obstruction on renal ultrasound: cortical thinning+ medical renal disease bilat - diuresed x 48hrs for ^'d SOB - creat was improving and now worsening again, +nausea x 48 hrs not improving, BUN > 150 , must assume pt is uremic so will need dialysis > have d/w pmd about tx to [facility], will consult IR for hd access  COVID-19 Pneumonia/AHRF -sp remdesivir, steroids. Completed empiric abx -mgmt per primary service. On supplemental O2  Anasarca/ hypoalbuminemia  -persistent -likely third spacing from hypoalbuminemia   Non-gap Metabolic Acidosis, improved -corrected anion gap 15 -likely related to AKI   Hypertension: -on toprol xl and cardizem  Afib -renally dose eliquis -rate control per primary service  Uncontrolled Diabetes Mellitus Type 2 with Hyperglycemia -mgmt per primary service   Kelly Splinter, MD 01/05/2020, 12:49 PM       Subjective:    seen in room, states breathing  Better, 2.5 L UOP yest w/ IV lasix  Physical Exam: alert, nad   no jvd  Chest cta bilat  Cor reg no RG  Abd soft ntnd no ascites   Ext diffuse 2-3+ edema, slightly better   Alert, NF, ox3    3 L UOP yest Objective:   BP (!) 149/80 (BP Location: Right Arm)   Pulse 82   Temp 98.4 F (36.9 C) (Oral)   Resp 20   Ht 5\' 3"  (1.6 m)   Wt 104 kg   SpO2 (!) 88%   BMI 40.60 kg/m   Intake/Output Summary (Last 24 hours) at 01/05/2020 1249 Last data filed at 01/05/2020 0830 Gross per 24 hour  Intake 810 ml  Output --  Net 810 ml   Weight change:       Labs: BMET Recent Labs  Lab 12/30/19 0340 12/31/19 0436 01/01/20 0628 01/02/20 0253 01/03/20 0453 01/04/20 0358 01/05/20 0352   NA 136 138 138 141 141 143 139  K 4.5 4.8 5.0 4.6 5.0 4.6 4.8  CL 106 107 104 104 100 104 102  CO2 20* 19* 22 22 26 24 24   GLUCOSE 104* 88 195* 205* 151* 81 105*  BUN 93* 114* 114* 113* 117* 127* 157*  CREATININE 3.49* 3.22* 3.03* 2.72* 2.63* 3.14* 3.80*  CALCIUM 7.4* 7.9* 8.9 9.4 9.4 8.7* 8.1*   CBC Recent Labs  Lab 01/02/20 0253 01/04/20 0358 01/05/20 0352  WBC 23.2* 32.3* 29.2*  HGB 13.3 13.2 11.4*  HCT 40.1 39.7 34.0*  MCV 95.2 95.2 95.2  PLT 304 165 124*    Medications:    . atorvastatin  80 mg Oral QHS  . benzonatate  100 mg Oral TID  . diltiazem  360 mg Oral Daily  . feeding supplement (ENSURE ENLIVE)  237 mL Oral TID BM  . insulin aspart  0-20 Units Subcutaneous TID WC  . insulin aspart  0-5 Units Subcutaneous QHS  . insulin glargine  40 Units Subcutaneous Daily  . levothyroxine  50 mcg Oral Daily  . linagliptin  5 mg Oral Daily  . metoprolol succinate  25 mg Oral Daily  . predniSONE  40 mg Oral Q breakfast  . sodium bicarbonate  650 mg Oral TID

## 2020-01-05 NOTE — Progress Notes (Signed)
Physical Therapy Treatment Patient Details Name: Kathryn Richardson MRN: [id number] DOB: Aug 30, 1934 Today's Date: 01/05/2020    History of Present Illness Pt is 85 yo female admitted with COVID. She was vacinnated.  Despite treatment pt remains hypoxic on high flow O2 with persistent bil PNE.  Pt with PMH of pacemaker, afib, dm, gout, ckd    PT Comments    Pt with worsening respiratory status with persistent bil PNE that is limiting progress with PT. She sat EOB but with sats continuing to decrease and pt symptomatic.  Cued for pursed lip breathing and relaxation but sats dropping to 78% at EOB and pt symptomatic and having to return to supine.  She was on 15 L  HFNC.  Sats did recover to upper 80's within 1-2 minutes of laying supine with HOB elevated. Pt required increased assistance and unable to stand - updated d/c recommendations to SNF.     Follow Up Recommendations  SNF     Equipment Recommendations  Rolling walker with 5" wheels (will need further assessment as progresses)    Recommendations for Other Services       Precautions / Restrictions Precautions Precautions: Fall Precaution Comments: monitor O2    Mobility  Bed Mobility Overal bed mobility: Needs Assistance Bed Mobility: Supine to Sit;Sit to Supine     Supine to sit: Mod assist Sit to supine: Mod assist   General bed mobility comments: Required assist for legs into and out of bed and assist to lift trunk.  Required max A to scoot up in bed with bed in trendelenberg position  Transfers Overall transfer level: Needs assistance               General transfer comment: unable to stand due to too weak and dropping O2 sats  Ambulation/Gait                 Stairs             Wheelchair Mobility    Modified Rankin (Stroke Patients Only)       Balance Overall balance assessment: Needs assistance Sitting-balance support: No upper extremity supported;Feet supported Sitting balance-Leahy  Scale: Fair Sitting balance - Comments: Pt sat EOB for 3-4 minutes with supervision and performed some AROM exercises.  Time at EOB limited due to sats initially at 85% but gradually dropping to 78%       Standing balance comment: unable                            Cognition Arousal/Alertness: Lethargic Behavior During Therapy: Flat affect Overall Cognitive Status: Within Functional Limits for tasks assessed                                        Exercises General Exercises - Lower Extremity Ankle Circles/Pumps: AROM;Both;10 reps;Seated Long Arc Quad: Both;10 reps;Seated;AAROM Shoulder Exercises Shoulder Flexion: AAROM;Both;5 reps;Seated    General Comments General comments (skin integrity, edema, etc.): Pt on 15 L HFNC with sats 88-89% supine, 78-85% sitting EOB, dropping to 76% with transfer back to bed, but recovred to upper 80's in 1-2 minutes.      Pertinent Vitals/Pain Pain Assessment: No/denies pain    Home Living                      Prior Function  PT Goals (current goals can now be found in the care plan section) Acute Rehab PT Goals Patient Stated Goal: recover PT Goal Formulation: With patient Time For Goal Achievement: 01/16/20 Potential to Achieve Goals: Good Progress towards PT goals: Not progressing toward goals - comment (decreased resp status)    Frequency    Min 3X/week      PT Plan Discharge plan needs to be updated    Co-evaluation              AM-PAC PT "6 Clicks" Mobility   Outcome Measure  Help needed turning from your back to your side while in a flat bed without using bedrails?: A Lot Help needed moving from lying on your back to sitting on the side of a flat bed without using bedrails?: A Lot Help needed moving to and from a bed to a chair (including a wheelchair)?: Total Help needed standing up from a chair using your arms (e.g., wheelchair or bedside chair)?: Total Help needed  to walk in hospital room?: Total Help needed climbing 3-5 steps with a railing? : Total 6 Click Score: 8    End of Session Equipment Utilized During Treatment: Oxygen Activity Tolerance: Patient limited by fatigue (limited by decreased O2) Patient left: with call bell/phone within reach;in bed;with bed alarm set Nurse Communication: Mobility status PT Visit Diagnosis: Muscle weakness (generalized) (M62.81);Difficulty in walking, not elsewhere classified (R26.2)     Time: 3009-2330 PT Time Calculation (min) (ACUTE ONLY): 20 min  Charges:  $Therapeutic Activity: 8-22 mins                     Abran Richard, PT Acute Rehab Services Pager [phone] [facility] [place] 01/05/2020, 3:27 PM

## 2020-01-05 NOTE — Progress Notes (Signed)
RN returned with patient from Interventional radiology. Right Hemo Dialysis catheter is clean dry intact. See flow sheet.

## 2020-01-05 NOTE — Care Plan (Signed)
Pt with transfer orders to [facility]. RN called report at 2013 to [facility] on 5W. [facility] notified. Report called to [facility] at approximately 2030. [facility] at bedside to transport pt at approximately 2100. All belongings sent with pt via transport. VSS at time of transfer. Sent on 15L HFNC.

## 2020-01-05 NOTE — Progress Notes (Signed)
PROGRESS NOTE  Kathryn Richardson  DOB: 03/05/1935  PCP: [name], MD KYH:[id number]  DOA: 01/07/2020  LOS: 13 days   Chief Complaint  Patient presents with  . Covid positive   Brief narrative: Kathryn Richardson is a 85 y.o. female with PMH of DM2, HTN, HLD, CKD 4, A. fib, GERD. Patient presented to the ED on 12/14/2019 with worsening COVID symptoms for 7 to 10 days. On 8/5, patient was diagnosed with Covid PCR positive.  She had mild symptoms only at that time.   8/10, patient had monoclonal antibody infusion RegenCov.   Symptoms continue to worsen and patient presented to the ED on 8/12. Of note, patient got vaccinated in February 2021.  In the ED, patient was noted to be hypoxic, required 6 L oxygen by nasal cannula. Chest x-ray on admission showed bilateral pneumonia. She was admitted to hospitalist service for further evaluation management.  Subjective: Patient was seen and examined this morning. On 2 L oxygen by nasal cannula.  Saturation in mid 80s. Blood work with creatinine further up at 3.8 today, WBC 29.2. Patient is oriented to place and person, slow to respond but able to follow commands.  We had discussions about CODE STATUS.  Patient is hesitant to make her DNR decision.  She states she would like to take any chances she has got.  Assessment/Plan: Breakthrough COVID pneumonia Acute respiratory failure with hypoxia  -Presented with worsening shortness of breath. Vaccinated in February 2021 -COVID test: PCR positive as an outpatient on 8/5. -Chest imaging: Chest x-ray on admission showed bilateral pneumonia. -Patient completed the course of IV remdesivir as well as 5-day course of antibiotics due to elevated procalcitonin. -Was not given Actemra or baricitinib due to concern for secondary bacterial infection. -CRP was noted to be elevated and improved.  D-dimer was never significantly elevated. -Despite standard treatment, patient continues to remain hypoxic and is  currently on 12 L high flow nasal cannula. -CT chest repeated on 8/24 continues to show persistent bilateral pneumonia. -Currently remains on prednisone 40 mg daily.  I will switch her to IV Solu-Medrol at a higher dose of 40 mg twice daily today. -Continue supportive care with incentive spirometry, antitussives, inhalers as needed. -Oxygen - SpO2: (!) 88 % O2 Flow Rate (L/min): 15 L/min FiO2 (%): 100 % -Continue airborne/contact isolation precautions. -WBC and inflammatory markers trend as below.  Lab Results  Component Value Date   SARSCOV2NAA POSITIVE (A) 12/26/2019   [place] NEGATIVE 08/13/2019    Recent Labs  Lab 12/30/19 0340 12/31/19 0436 01/02/20 0253 01/04/20 0358 01/05/20 0352  WBC  --   --  23.2* 32.3* 29.2*  CRP 2.7*  --   --   --   --   ALT 18 22  --   --   --    The treatment plan and use of medications and known side effects were discussed with patient/family. Some of the medications used are based on case reports/anecdotal data.  All other medications being used in the management of COVID-19 based on limited study data.  Complete risks and long-term side effects are unknown, however in the best clinical judgment they seem to be of some benefit.  Patient wanted to proceed with treatment options provided.  AKI on CKD IV Normal anion gap metabolic acidosis Hyponatremia -Patient's baseline creatinine is around 1.7-2.2.   -Presented with baseline renal function but has worsened since admission. Off ACEI. No Lasix in last 48 hours.  -However, patient continues to have  worsening renal function, creatinine 3.8 today. She has generalized edema as well. -Previously done renal ultrasound did not show any hydronephrosis.   -Nephrology plans to start the patient in dialysis.  Temporary dialysis catheter will be placed.  Plan to transfer to [facility] in preparation of dialysis. -Remains on oral sodium bicarbonate for metabolic acidosis which has improved. Recent Labs     12/27/19 1553 12/28/19 0412 12/29/19 0421 12/30/19 0340 12/31/19 0436 01/01/20 1194 01/02/20 0253 01/03/20 0453 01/04/20 0358 01/05/20 0352  CREATININE 3.78* 3.56* 3.48* 3.49* 3.22* 3.03* 2.72* 2.63* 3.14* 3.80*   Acute diarrhea Maroon-colored stool -Patient apparently had abdominal pain and constipation and hence she was started on Senokot and scheduled MiraLAX. She subsequently had episodes of diarrhea which has now stopped.  She also had some maroon-colored stool with diarrhea yesterday. No further bleeding. -Hemoglobin stable.  Continue to monitor. Recent Labs    12/11/2019 2207 12/16/2019 2247 12/24/19 0502 12/25/19 0507 12/26/19 0448 12/27/19 0441 12/28/19 0412 01/02/20 0253 01/04/20 0358 01/05/20 0352  HGB 14.8 14.6 14.6 13.5 13.2 13.1 12.7 13.3 13.2 11.4*   Diabetes mellitus type 2, uncontrolled with hyperglycemia -A1c 10.4 -Initially hyperglycemia secondary to steroids. Now sugar level is mostly in target range.  -Has poor oral intake as well. Currently on Lantus 40 units and sliding scale insulin with Accu-Cheks. Recent Labs  Lab 01/04/20 1119 01/04/20 1715 01/04/20 2118 01/05/20 0811 01/05/20 1130  GLUCAP 99 76 156* 139* 100*   History of paroxysmal atrial fibrillation -Continue Cardizem, metoprolol -Heart rate fluctuating within target range. -Continue to keep Eliquis on hold because of maroon-colored stool.  Hyperlipidemia -Continue Lipitor  Anxiety Secondary to acute illness. As needed Xanax  Mobility: PT eval obtained. Home health PT versus SNF Code Status:   Code Status: Full Code  Nutritional status: Body mass index is 40.6 kg/m.     Diet Order            Diet heart healthy/carb modified Room service appropriate? Yes; Fluid consistency: Thin; Fluid restriction: 1200 mL Fluid  Diet effective now                 DVT prophylaxis: SCDs Start: 12/23/19 2158   Antimicrobials:  None Fluid: None  Consultants: Nephrology Family  Communication: : Called and updated patient's daughter Ms. Alvino Chapel this afternoon.  Status is: Inpatient  Remains inpatient appropriate because:Hemodynamically unstable, Ongoing diagnostic testing needed not appropriate for outpatient work up and IV treatments appropriate due to intensity of illness or inability to take PO   Dispo:  Patient From: [facility]  Planned Disposition: [facility]  Expected discharge date: Unclear at this time  medically stable for discharge: No   Infusions:  . sodium chloride 10 mL/hr at 12/31/19 0236    Scheduled Meds: . atorvastatin  80 mg Oral QHS  . benzonatate  100 mg Oral TID  . diltiazem  360 mg Oral Daily  . feeding supplement (ENSURE ENLIVE)  237 mL Oral TID BM  . insulin aspart  0-20 Units Subcutaneous TID WC  . insulin aspart  0-5 Units Subcutaneous QHS  . insulin glargine  40 Units Subcutaneous Daily  . levothyroxine  50 mcg Oral Daily  . linagliptin  5 mg Oral Daily  . methylPREDNISolone (SOLU-MEDROL) injection  40 mg Intravenous Q12H  . metoprolol succinate  25 mg Oral Daily  . sodium bicarbonate  650 mg Oral TID    Antimicrobials: Anti-infectives (From admission, onward)   Start  Dose/Rate Route Frequency Ordered Stop   12/24/19 1000  cefTRIAXone (ROCEPHIN) 2 g in sodium chloride 0.9 % 100 mL IVPB        2 g 200 mL/hr over 30 Minutes Intravenous Every 24 hours 12/24/19 0843 12/28/19 1001   12/24/19 1000  azithromycin (ZITHROMAX) 500 mg in sodium chloride 0.9 % 250 mL IVPB        500 mg 250 mL/hr over 60 Minutes Intravenous Every 24 hours 12/24/19 0843 12/28/19 1104   12/23/19 1000  remdesivir 100 mg in sodium chloride 0.9 % 100 mL IVPB       "Followed by" Linked Group Details   100 mg 200 mL/hr over 30 Minutes Intravenous Daily 12/23/2019 2317 12/26/19 0842   12/23/19 0001  remdesivir 100 mg in sodium chloride 0.9 % 100 mL IVPB       "Followed by" Linked Group Details   100 mg 200 mL/hr over 30  Minutes Intravenous  Once 12/11/2019 2317 12/23/19 0224   01/07/2020 2330  remdesivir 100 mg in sodium chloride 0.9 % 100 mL IVPB       "Followed by" Linked Group Details   100 mg 200 mL/hr over 30 Minutes Intravenous  Once 01/07/2020 2317 12/23/19 0052      PRN meds: sodium chloride, acetaminophen, albuterol, ALPRAZolam, chlorpheniramine-HYDROcodone, diltiazem, guaiFENesin-dextromethorphan, hydrALAZINE, ondansetron **OR** ondansetron (ZOFRAN) IV, oxyCODONE, traZODone   Objective: Vitals:   01/05/20 1039 01/05/20 1040  BP: (!) 149/80   Pulse: 82   Resp: 20   Temp: 98.4 F (36.9 C)   SpO2: (!) 82% (!) 88%    Intake/Output Summary (Last 24 hours) at 01/05/2020 1312 Last data filed at 01/05/2020 0830 Gross per 24 hour  Intake 810 ml  Output --  Net 810 ml   Filed Weights   01/01/20 0500 01/02/20 0514 01/05/20 0600  Weight: 105 kg 104.3 kg 104 kg   Weight change:  Body mass index is 40.6 kg/m.   Physical Exam: General exam: Looks lethargic.  Generalized anasarca present.  Not in physical distress Skin: No rashes, lesions or ulcers. HEENT: Atraumatic, normocephalic, supple neck, no obvious bleeding Lungs: Diminished air entry on both sides. CVS: Regular rate and rhythm, no murmur GI/Abd soft, nontender, nondistended, bowel sound present CNS: Alert, awake, oriented to place Psychiatry: Depressed look because of persistent symptoms and oxygen dependence Extremities: Generalized anasarca present  Data Review: I have personally reviewed the laboratory data and studies available.  Recent Labs  Lab 01/02/20 0253 01/04/20 0358 01/05/20 0352  WBC 23.2* 32.3* 29.2*  HGB 13.3 13.2 11.4*  HCT 40.1 39.7 34.0*  MCV 95.2 95.2 95.2  PLT 304 165 124*   Recent Labs  Lab 01/01/20 0628 01/02/20 0253 01/03/20 0453 01/04/20 0358 01/05/20 0352  NA 138 141 141 143 139  K 5.0 4.6 5.0 4.6 4.8  CL 104 104 100 104 102  CO2 22 22 26 24 24   GLUCOSE 195* 205* 151* 81 105*  BUN 114* 113*  117* 127* 157*  CREATININE 3.03* 2.72* 2.63* 3.14* 3.80*  CALCIUM 8.9 9.4 9.4 8.7* 8.1*   Lab Results  Component Value Date   HGBA1C 10.4 (H) 12/25/2019       Component Value Date/Time   TRIG 109 12/25/2019 2207    Signed, Terrilee Croak, MD [facility] Pager: [phone] (Secure Chat preferred). 01/05/2020

## 2020-01-06 ENCOUNTER — Inpatient Hospital Stay (HOSPITAL_COMMUNITY): Payer: Medicare Other

## 2020-01-06 DIAGNOSIS — N184 Chronic kidney disease, stage 4 (severe): Secondary | ICD-10-CM

## 2020-01-06 DIAGNOSIS — R0902 Hypoxemia: Secondary | ICD-10-CM

## 2020-01-06 LAB — COMPREHENSIVE METABOLIC PANEL
ALT: 28 U/L (ref 0–44)
AST: 33 U/L (ref 15–41)
Albumin: 2.2 g/dL — ABNORMAL LOW (ref 3.5–5.0)
Alkaline Phosphatase: 54 U/L (ref 38–126)
Anion gap: 14 (ref 5–15)
BUN: 168 mg/dL — ABNORMAL HIGH (ref 8–23)
CO2: 23 mmol/L (ref 22–32)
Calcium: 8 mg/dL — ABNORMAL LOW (ref 8.9–10.3)
Chloride: 99 mmol/L (ref 98–111)
Creatinine, Ser: 4.23 mg/dL — ABNORMAL HIGH (ref 0.44–1.00)
GFR calc Af Amer: 11 mL/min — ABNORMAL LOW (ref >60)
GFR calc non Af Amer: 9 mL/min — ABNORMAL LOW (ref >60)
Glucose, Bld: 106 mg/dL — ABNORMAL HIGH (ref 70–99)
Potassium: 5.5 mmol/L — ABNORMAL HIGH (ref 3.5–5.1)
Sodium: 136 mmol/L (ref 135–145)
Total Bilirubin: 1.2 mg/dL (ref 0.3–1.2)
Total Protein: 5.1 g/dL — ABNORMAL LOW (ref 6.5–8.1)

## 2020-01-06 LAB — PROTIME-INR
INR: 1.5 — ABNORMAL HIGH (ref 0.8–1.2)
Prothrombin Time: 17.1 seconds — ABNORMAL HIGH (ref 11.4–15.2)

## 2020-01-06 LAB — D-DIMER, QUANTITATIVE: D-Dimer, Quant: 5.89 ug/mL-FEU — ABNORMAL HIGH (ref 0.00–0.50)

## 2020-01-06 LAB — GLUCOSE, CAPILLARY
Glucose-Capillary: 97 mg/dL (ref 70–99)
Glucose-Capillary: 173 mg/dL — ABNORMAL HIGH (ref 70–99)
Glucose-Capillary: 105 mg/dL — ABNORMAL HIGH (ref 70–99)
Glucose-Capillary: 158 mg/dL — ABNORMAL HIGH (ref 70–99)

## 2020-01-06 LAB — C-REACTIVE PROTEIN: CRP: 9 mg/dL — ABNORMAL HIGH (ref <1.0)

## 2020-01-06 LAB — HEPATITIS B SURFACE ANTIGEN: Hepatitis B Surface Ag: NONREACTIVE

## 2020-01-06 LAB — CBC
HCT: 32.6 % — ABNORMAL LOW (ref 36.0–46.0)
Hemoglobin: 10.8 g/dL — ABNORMAL LOW (ref 12.0–15.0)
MCH: 31.1 pg (ref 26.0–34.0)
MCHC: 33.1 g/dL (ref 30.0–36.0)
MCV: 93.9 fL (ref 80.0–100.0)
Platelets: DECREASED 10*3/uL (ref 150–400)
RBC: 3.47 MIL/uL — ABNORMAL LOW (ref 3.87–5.11)
RDW: 15.3 % (ref 11.5–15.5)
WBC: 23.5 10*3/uL — ABNORMAL HIGH (ref 4.0–10.5)
nRBC: 0 % (ref 0.0–0.2)

## 2020-01-06 LAB — APTT
aPTT: 111 seconds — ABNORMAL HIGH (ref 24–36)
aPTT: 29 seconds (ref 24–36)

## 2020-01-06 LAB — HEPARIN LEVEL (UNFRACTIONATED)
Heparin Unfractionated: >2.2 IU/mL — ABNORMAL HIGH (ref 0.30–0.70)
Heparin Unfractionated: 1.52 IU/mL — ABNORMAL HIGH (ref 0.30–0.70)

## 2020-01-06 LAB — HEPATITIS B CORE ANTIBODY, TOTAL: Hep B Core Total Ab: NONREACTIVE

## 2020-01-06 MED ORDER — MIDODRINE HCL 5 MG PO TABS
10.0000 mg | ORAL_TABLET | Freq: Two times a day (BID) | ORAL | Status: DC
  Administered 2020-01-06 – 2020-01-07 (×2): 10 mg via ORAL
  Filled 2020-01-06 (×2): qty 2

## 2020-01-06 MED ORDER — HEPARIN (PORCINE) 25000 UT/250ML-% IV SOLN
1100.0000 [IU]/h | INTRAVENOUS | Status: DC
  Administered 2020-01-06 – 2020-01-07 (×2): 1100 [IU]/h via INTRAVENOUS
  Filled 2020-01-06 (×2): qty 250

## 2020-01-06 MED ORDER — PANTOPRAZOLE SODIUM 40 MG PO TBEC
40.0000 mg | DELAYED_RELEASE_TABLET | Freq: Every day | ORAL | Status: DC
  Administered 2020-01-06 – 2020-01-08 (×3): 40 mg via ORAL
  Filled 2020-01-06 (×3): qty 1

## 2020-01-06 MED ORDER — POLYETHYLENE GLYCOL 3350 17 G PO PACK
17.0000 g | PACK | Freq: Every day | ORAL | Status: DC
  Administered 2020-01-06 – 2020-01-07 (×2): 17 g via ORAL
  Filled 2020-01-06 (×3): qty 1

## 2020-01-06 MED ORDER — DILTIAZEM HCL 60 MG PO TABS
60.0000 mg | ORAL_TABLET | Freq: Three times a day (TID) | ORAL | Status: DC
  Administered 2020-01-06 – 2020-01-07 (×3): 60 mg via ORAL
  Filled 2020-01-06 (×3): qty 1

## 2020-01-06 MED ORDER — METOPROLOL TARTRATE 12.5 MG HALF TABLET
12.5000 mg | ORAL_TABLET | Freq: Two times a day (BID) | ORAL | Status: DC
  Administered 2020-01-07: 12.5 mg via ORAL
  Filled 2020-01-06: qty 1

## 2020-01-06 MED ORDER — INSULIN GLARGINE 100 UNIT/ML ~~LOC~~ SOLN
15.0000 [IU] | Freq: Every day | SUBCUTANEOUS | Status: DC
  Administered 2020-01-07: 15 [IU] via SUBCUTANEOUS
  Filled 2020-01-06 (×2): qty 0.15

## 2020-01-06 NOTE — Progress Notes (Signed)
Hypoglycemic Event  CBG: 62  Treatment: 8oz orange juice  Symptoms:no symptoms noted  Follow-up CBG: Time:2350 CBG Result:97  Possible Reasons for Event:  patient has poor appetite not eating much per report prior to pt transferred to 5W  Comments/MD notified: note written; charge nurse notified.     Dellia Beckwith

## 2020-01-06 NOTE — Progress Notes (Signed)
PROGRESS NOTE                                                                                                                                                                                                             Patient Demographics:    Kathryn Richardson, is a 85 y.o. female, DOB - 08/26/1934, MIW:[id number]  Outpatient Primary MD for the patient is [name], MD   Admit date - 12/31/2019   LOS - 83  Chief Complaint  Patient presents with  . Covid positive       Brief Narrative: Patient is a 85 y.o. female with PMHx of CKD stage IV, DM-2, HTN, HLD, PAF on anticoagulation-diagnosed with COVID-19 on 8/5-admitted to [facility] on 8/12 for worsening shortness of breath-found to have acute hypoxic respiratory failure due to COVID-19 pneumonia.  Admitted to the hospitalist service-and started on steroids/remdesivir and other supportive care.  Unfortunately for the hospital course complicated by worsening hypoxemia requiring 15 L of HFNC and worsening AKI with anasarca.  Transferred to [facility] on 8/26-for initiation of HD.  See below for further details  COVID-19 vaccinated status: Vaccinated  Significant Events: 8/5>> COVID-19 positive 8/12>> admit for hypoxia from COVID-19 pneumonia. 8/26>> transfer to [facility] for HD.  Significant studies: 8/12>>Chest x-ray: Patchy bilateral airspace opacities 8/15>> Echo: EF 25-00%, RV systolic function is mildly reduced 8/18>> chest x-ray: Airspace opacity bilaterally more on the right than on the left. 8/22>> chest x-ray: Stable bilateral multifocal pneumonia 8/24>> CT chest: Bilateral groundglass opacities within both lungs, retained mucus in the dependent trachea extending to the right mainstem bronchus, small bilateral pleural effusions. 8/24>> CT abdomen/pelvis: Small volume abdominal pelvic ascites, generalized body wall edema, sigmoid diverticulosis without diverticulitis, sludge in the  gallbladder 8/27>> chest x-ray: Increasing diffuse bilateral interstitial heterogeneous or space opacity  COVID-19 medications: Steroids: 8/12>> Remdesivir: 8/12>> 8/16 Casirivimab-imdevimab: 8/10 x 1  Antibiotics: Rocephin: 8/14>> 8/18 Zithromax: 8/14>> 8/18  Microbiology data: 8/12 >>blood culture: No growth  Procedures: 8/26>> right IJ temporary HD catheter by IR  Consults: None  DVT prophylaxis: SCDs Start: 12/23/19 2158    Subjective:    Kathryn Richardson today appears chronically ill-appearing-on 15 L of HFNC-but not short of breath at rest-not in acute distress.   Assessment  & Plan :   Acute Hypoxic Resp Failure due to breakthrough Covid 19 infection  with pneumonia: Remains severely hypoxemic requiring 15 L HFNC.  Probably in the fibrotic stage of ARDS at this point.  Could potentially have some volume overload in the setting of anasarca and worsening renal failure-and hypoxia may improve with HD.  Continue supportive care-continue steroids but taper more today.    Long discussion with patient at bedside-and subsequently with her daughter (Deb)-given severity of acute illness/frailty/renal failure-and the fact that she will likely not do well with intubation mechanical ventilation-we have all agree that we should not escalate care beyond what we are currently doing.  DNR ordered.   Fever: afebrile O2 requirements:  SpO2: 91 % O2 Flow Rate (L/min): 15 L/min FiO2 (%): 100 %   COVID-19 Labs: Recent Labs    01/06/20 0726  DDIMER 5.89*  CRP 9.0*       Component Value Date/Time   BNP 211.4 (H) 12/26/2019 0448    No results for input(s): PROCALCITON in the last 168 hours.  Lab Results  Component Value Date   SARSCOV2NAA POSITIVE (A) 12/21/2019   [place] NEGATIVE 08/13/2019     Prone/Incentive Spirometry: encouragedincentive spirometry use 3-4/hour.  Elevated D-dimer: Probably secondary to COVID-19 infection-insertion of HD catheter.  Hypoxia has  remained stable over the past few days.  Eliquis held due to mild hematochezia 2-3 days back.  We will go ahead and resume anticoagulation with IV heparin and monitor closely.  AKI on CKD stage IV: AKI hemodynamically mediated due to COVID-19 infection-massively volume overloaded with generalized anasarca.  Nephrology following-with plans for starting HD later today.  Hoping that hypoxia will improve with several rounds of HD.  Anasarca: Due to hypoalbuminemia and renal failure.  To start HD later today.  Follow weights.  [place] metabolic acidosis: Improved-likely due to AKI.  PAF: Rate controlled-change Cardizem/metoprolol to short-acting-as probably will drop BP with HD.  Resume anticoagulation-continue to hold Eliquis but start heparin.  History of sick sinus syndrome: PPM in place-telemetry monitoring continues.  Transient hematochezia: Occurred 2-3 days back-but has since resolved.  Apparently occurred after laxatives were given for constipation.  Starting IV heparin and monitoring closely.  DM-2 (A1c 10.4 on 8/15) with uncontrolled hyperglycemia due to steroids: Hypoglycemia earlier this morning-decrease Lantus to 15 units-continue SSI and follow.  Suspect that due to AKI-and as steroids get tapered down-we will require less insulin.    Recent Labs    01/05/20 2354 01/06/20 0748 01/06/20 1200  GLUCAP 97 97 173*   Hypothyroidism: Continue Synthroid  Anxiety: Relatively stable-secondary to acute illness-continue as needed Xanax  Constipation/diarrhea: Had constipation-given laxatives and then briefly had diarrhea.  Will maintain on a bowel regimen with MiraLAX-and titrate accordingly.  Debility/deconditioning: Secondary to acute illness-PT/OT following.  Palliative care discussion: Long discussion by this MD on 8/27-initially with patient at bedside and then with her daughter-Deb over the phone.  Worsening clinical trajectory in spite of maximal medical therapy-discussed-her AKI  has worsened-she is now requiring HD.  We will continue to stabilize and treat her-but given advanced age-prolonged hospital course/debility-AKI requiring HD-severity of hypoxemia due to COVID pneumonia-do not think that she will do well with any further escalation in care including intubation and mechanical ventilation.  We all agreed to continue to treat as outlined above-but if she were to worsen significantly in spite of treatment-patient/daughter agreeable to transition to comfort care measures.   DNR order entered after discussion with patient/daughter Deb  Morbid Obesity: Estimated body mass index is 40.6 kg/m as calculated from the following:   Height as  of this encounter: 5\' 3"  (1.6 m).   Weight as of this encounter: 104 kg.   RN pressure injury documentation:    GI prophylaxis: PPI  ABG:    Component Value Date/Time   HCO3 23.9 12/24/2019 0502   TCO2 27 12/28/2019 2247   O2SAT 83.7 12/24/2019 0502    Vent Settings: N/A  Condition - Extremely Guarded-very tenuous with risk for further deterioration  Family Communication  :  Daughter-Deb [phone] updated over the phone  Code Status :  DNR  Diet :  Diet Order            Diet heart healthy/carb modified Room service appropriate? Yes; Fluid consistency: Thin; Fluid restriction: 1200 mL Fluid  Diet effective now                  Disposition Plan  :   Status is: Inpatient  Remains inpatient appropriate because:Inpatient level of care appropriate due to severity of illness   Dispo:  Patient From: [facility]  Planned Disposition: [facility]  Expected discharge date: TBD  Medically stable for discharge: No     Barriers to discharge: Hypoxia requiring O2 supplementation-AKI starting HD  Antimicorbials  :    Anti-infectives (From admission, onward)   Start     Dose/Rate Route Frequency Ordered Stop   12/24/19 1000  cefTRIAXone (ROCEPHIN) 2 g in sodium chloride 0.9 % 100 mL IVPB         2 g 200 mL/hr over 30 Minutes Intravenous Every 24 hours 12/24/19 0843 12/28/19 1001   12/24/19 1000  azithromycin (ZITHROMAX) 500 mg in sodium chloride 0.9 % 250 mL IVPB        500 mg 250 mL/hr over 60 Minutes Intravenous Every 24 hours 12/24/19 0843 12/28/19 1104   12/23/19 1000  remdesivir 100 mg in sodium chloride 0.9 % 100 mL IVPB       "Followed by" Linked Group Details   100 mg 200 mL/hr over 30 Minutes Intravenous Daily 01/04/2020 2317 12/26/19 0842   12/23/19 0001  remdesivir 100 mg in sodium chloride 0.9 % 100 mL IVPB       "Followed by" Linked Group Details   100 mg 200 mL/hr over 30 Minutes Intravenous  Once 01/06/2020 2317 12/23/19 0224   01/10/2020 2330  remdesivir 100 mg in sodium chloride 0.9 % 100 mL IVPB       "Followed by" Linked Group Details   100 mg 200 mL/hr over 30 Minutes Intravenous  Once 01/10/2020 2317 12/23/19 0052      Inpatient Medications  Scheduled Meds: . atorvastatin  80 mg Oral QHS  . benzonatate  100 mg Oral TID  . Chlorhexidine Gluconate Cloth  6 each Topical Daily  . diltiazem  60 mg Oral Q8H  . feeding supplement (ENSURE ENLIVE)  237 mL Oral TID BM  . insulin aspart  0-20 Units Subcutaneous TID WC  . insulin aspart  0-5 Units Subcutaneous QHS  . [START ON 01/07/2020] insulin glargine  15 Units Subcutaneous Daily  . levothyroxine  50 mcg Oral Daily  . linagliptin  5 mg Oral Daily  . methylPREDNISolone (SOLU-MEDROL) injection  40 mg Intravenous Q12H  . [START ON 01/07/2020] metoprolol tartrate  12.5 mg Oral BID  . midodrine  10 mg Oral BID WC  . sodium bicarbonate  650 mg Oral TID   Continuous Infusions: . sodium chloride 10 mL/hr at 12/31/19 0236   PRN Meds:.sodium chloride, acetaminophen, albuterol, ALPRAZolam, chlorpheniramine-HYDROcodone,  diltiazem, guaiFENesin-dextromethorphan, hydrALAZINE, lidocaine (PF), ondansetron **OR** ondansetron (ZOFRAN) IV, oxyCODONE, traZODone   Time Spent in minutes  35    See all Orders from today for  further details   Oren Binet M.D on 01/06/2020 at 12:02 PM  To page go to [email or web address] - use universal password  [facility] -  Office  [phone]    Objective:   Vitals:   01/06/20 0744 01/06/20 0800 01/06/20 1000 01/06/20 1156  BP: (!) 109/58 117/74  116/77  Pulse: 90 96  99  Resp: 19 18  (!) 22  Temp: 97.8 F (36.6 C)  (!) 97.1 F (36.2 C)   TempSrc: Oral  Axillary   SpO2: 96% 96%  91%  Weight:      Height:        Wt Readings from Last 3 Encounters:  01/05/20 104 kg  09/06/19 89.5 kg  08/10/19 88.9 kg     Intake/Output Summary (Last 24 hours) at 01/06/2020 1202 Last data filed at 01/06/2020 1100 Gross per 24 hour  Intake 712 ml  Output --  Net 712 ml     Physical Exam Gen Exam:Alert awake-not in any distress.  Chronically ill-appearing. HEENT:atraumatic, normocephalic Chest: B/L clear to auscultation anteriorly CVS:S1S2 regular Abdomen:soft non tender, non distended Extremities:+++ edema Neurology: Non focal Skin: no rash   Data Review:    CBC Recent Labs  Lab 01/02/20 0253 01/04/20 0358 01/05/20 0352 01/06/20 0726  WBC 23.2* 32.3* 29.2* 23.5*  HGB 13.3 13.2 11.4* 10.8*  HCT 40.1 39.7 34.0* 32.6*  PLT 304 165 124* PLATELET CLUMPS NOTED ON SMEAR, COUNT APPEARS DECREASED  MCV 95.2 95.2 95.2 93.9  MCH 31.6 31.7 31.9 31.1  MCHC 33.2 33.2 33.5 33.1  RDW 15.8* 15.9* 15.8* 15.3    Chemistries  Recent Labs  Lab 12/31/19 0436 01/01/20 0628 01/02/20 0253 01/03/20 0453 01/04/20 0358 01/05/20 0352 01/06/20 0726  NA 138   < > 141 141 143 139 136  K 4.8   < > 4.6 5.0 4.6 4.8 5.5*  CL 107   < > 104 100 104 102 99  CO2 19*   < > 22 26 24 24 23   GLUCOSE 88   < > 205* 151* 81 105* 106*  BUN 114*   < > 113* 117* 127* 157* 168*  CREATININE 3.22*   < > 2.72* 2.63* 3.14* 3.80* 4.23*  CALCIUM 7.9*   < > 9.4 9.4 8.7* 8.1* 8.0*  AST 23  --   --   --   --   --  33  ALT 22  --   --   --   --   --  28  ALKPHOS 52  --   --   --   --   --   54  BILITOT 0.4  --   --   --   --   --  1.2   < > = values in this interval not displayed.   ------------------------------------------------------------------------------------------------------------------ No results for input(s): CHOL, HDL, LDLCALC, TRIG, CHOLHDL, LDLDIRECT in the last 72 hours.  Lab Results  Component Value Date   HGBA1C 10.4 (H) 12/25/2019   ------------------------------------------------------------------------------------------------------------------ No results for input(s): TSH, T4TOTAL, T3FREE, THYROIDAB in the last 72 hours.  Invalid input(s): FREET3 ------------------------------------------------------------------------------------------------------------------ No results for input(s): VITAMINB12, FOLATE, FERRITIN, TIBC, IRON, RETICCTPCT in the last 72 hours.  Coagulation profile No results for input(s): INR, PROTIME in the last 168 hours.  Recent Labs    01/06/20 0726  DDIMER 5.89*  Cardiac Enzymes No results for input(s): CKMB, TROPONINI, MYOGLOBIN in the last 168 hours.  Invalid input(s): CK ------------------------------------------------------------------------------------------------------------------    Component Value Date/Time   BNP 211.4 (H) 12/26/2019 0448    Micro Results No results found for this or any previous visit (from the past 240 hour(s)).  Radiology Reports CT ABDOMEN PELVIS WO CONTRAST  Result Date: 01/03/2020 CLINICAL DATA:  Respiratory failure with COVID-19 and worsening oxygenation. Nausea vomiting and abdominal pain. EXAM: CT CHEST, ABDOMEN AND PELVIS WITHOUT CONTRAST TECHNIQUE: Multidetector CT imaging of the chest, abdomen and pelvis was performed following the standard protocol without IV contrast. COMPARISON:  Abdominal radiograph earlier today. Chest radiograph 01/01/2020. FINDINGS: CT CHEST FINDINGS Cardiovascular: Left-sided pacemaker in place. Mild cardiomegaly. No pericardial effusion. Thoracic aorta is  normal in caliber with mild to moderate atherosclerosis. Mediastinum/Nodes: Small mediastinal lymph nodes some of which are calcified, not enlarged by size criteria. No bulky hilar adenopathy. Tiny hiatal hernia. No esophageal wall thickening. No suspicious thyroid nodule. Lungs/Pleura: Bilateral geographic ground-glass opacities within both lungs. Superimposed consolidations in the anterior right upper and right middle lobes. There are small bilateral pleural effusions. Calcified granuloma in the right lower lobe. There is retained mucus within the dependent trachea extending into the right mainstem bronchus. Musculoskeletal: Multilevel degenerative change in the thoracic spine. There are no acute or suspicious osseous abnormalities. CT ABDOMEN PELVIS FINDINGS Hepatobiliary: No evidence of focal liver abnormality on noncontrast exam. Possible high-density contents in the gallbladder which may represent sludge. There is a small amount of perihepatic ascites. Small peripheral calcifications consistent with prior granulomatous disease. Pancreas: No ductal dilatation or inflammation. Spleen: No focal splenic abnormality. Small amount of perisplenic ascites. Occasional calcified granuloma. Adrenals/Urinary Tract: Mild bilateral adrenal thickening without dominant nodule. Bilateral renal parenchymal thinning. No hydronephrosis. Mild symmetric perinephric edema. Small amount of perinephric fluid adjacent to the inferior right kidney. No evidence of focal lesion or stone. Urinary bladder is unremarkable. Stomach/Bowel: Small hiatal hernia. The stomach is unremarkable. No small bowel obstruction or inflammation. Cecum slightly high-riding in the right mid abdomen. Appendix is not definitively visualized. Mixed liquid and solid stool throughout the colon. There is no colonic wall thickening or inflammation. Sigmoid diverticulosis without diverticulitis. Vascular/Lymphatic: Aortic atherosclerosis. No aortic aneurysm. No  portal or mesenteric venous gas. There is no bulky abdominopelvic adenopathy. Reproductive: Uterus unremarkable. No adnexal mass. Ovaries not well-defined. Other: Small volume abdominopelvic ascites with free fluid in the right greater than left upper quadrant, pericolic gutters, and in the pelvis. There is generalized subcutaneous edema of the body wall. Scarring and postsurgical change of the midline anterior abdominal wall. No free air or abscess. Musculoskeletal: Degenerative change of the hips and spine. There are no acute or suspicious osseous abnormalities. IMPRESSION: 1. Bilateral geographic ground-glass opacities within both lungs with superimposed consolidations in the anterior right upper and right middle lobes, consistent with pneumonia, pattern typical of COVID-19. Parenchymal involvement is moderate. 2. Small bilateral pleural effusions. 3. Retained mucus within the dependent trachea extending into the right mainstem bronchus. 4. Small volume abdominopelvic ascites. Generalized body wall edema. Findings suggest third-spacing. 5. Sigmoid diverticulosis without diverticulitis. 6. Possible high-density contents in the gallbladder which may represent sludge. 7. Liquid and solid stool throughout the colon which can be seen in the setting of diarrhea. No bowel wall thickening. 8. Additional chronic findings as described. Aortic Atherosclerosis (ICD10-I70.0). Electronically Signed   By: Keith Rake M.D.   On: 01/03/2020 19:07   CT CHEST WO CONTRAST  Result Date:  01/03/2020 CLINICAL DATA:  Respiratory failure with COVID-19 and worsening oxygenation. Nausea vomiting and abdominal pain. EXAM: CT CHEST, ABDOMEN AND PELVIS WITHOUT CONTRAST TECHNIQUE: Multidetector CT imaging of the chest, abdomen and pelvis was performed following the standard protocol without IV contrast. COMPARISON:  Abdominal radiograph earlier today. Chest radiograph 01/01/2020. FINDINGS: CT CHEST FINDINGS Cardiovascular: Left-sided  pacemaker in place. Mild cardiomegaly. No pericardial effusion. Thoracic aorta is normal in caliber with mild to moderate atherosclerosis. Mediastinum/Nodes: Small mediastinal lymph nodes some of which are calcified, not enlarged by size criteria. No bulky hilar adenopathy. Tiny hiatal hernia. No esophageal wall thickening. No suspicious thyroid nodule. Lungs/Pleura: Bilateral geographic ground-glass opacities within both lungs. Superimposed consolidations in the anterior right upper and right middle lobes. There are small bilateral pleural effusions. Calcified granuloma in the right lower lobe. There is retained mucus within the dependent trachea extending into the right mainstem bronchus. Musculoskeletal: Multilevel degenerative change in the thoracic spine. There are no acute or suspicious osseous abnormalities. CT ABDOMEN PELVIS FINDINGS Hepatobiliary: No evidence of focal liver abnormality on noncontrast exam. Possible high-density contents in the gallbladder which may represent sludge. There is a small amount of perihepatic ascites. Small peripheral calcifications consistent with prior granulomatous disease. Pancreas: No ductal dilatation or inflammation. Spleen: No focal splenic abnormality. Small amount of perisplenic ascites. Occasional calcified granuloma. Adrenals/Urinary Tract: Mild bilateral adrenal thickening without dominant nodule. Bilateral renal parenchymal thinning. No hydronephrosis. Mild symmetric perinephric edema. Small amount of perinephric fluid adjacent to the inferior right kidney. No evidence of focal lesion or stone. Urinary bladder is unremarkable. Stomach/Bowel: Small hiatal hernia. The stomach is unremarkable. No small bowel obstruction or inflammation. Cecum slightly high-riding in the right mid abdomen. Appendix is not definitively visualized. Mixed liquid and solid stool throughout the colon. There is no colonic wall thickening or inflammation. Sigmoid diverticulosis without  diverticulitis. Vascular/Lymphatic: Aortic atherosclerosis. No aortic aneurysm. No portal or mesenteric venous gas. There is no bulky abdominopelvic adenopathy. Reproductive: Uterus unremarkable. No adnexal mass. Ovaries not well-defined. Other: Small volume abdominopelvic ascites with free fluid in the right greater than left upper quadrant, pericolic gutters, and in the pelvis. There is generalized subcutaneous edema of the body wall. Scarring and postsurgical change of the midline anterior abdominal wall. No free air or abscess. Musculoskeletal: Degenerative change of the hips and spine. There are no acute or suspicious osseous abnormalities. IMPRESSION: 1. Bilateral geographic ground-glass opacities within both lungs with superimposed consolidations in the anterior right upper and right middle lobes, consistent with pneumonia, pattern typical of COVID-19. Parenchymal involvement is moderate. 2. Small bilateral pleural effusions. 3. Retained mucus within the dependent trachea extending into the right mainstem bronchus. 4. Small volume abdominopelvic ascites. Generalized body wall edema. Findings suggest third-spacing. 5. Sigmoid diverticulosis without diverticulitis. 6. Possible high-density contents in the gallbladder which may represent sludge. 7. Liquid and solid stool throughout the colon which can be seen in the setting of diarrhea. No bowel wall thickening. 8. Additional chronic findings as described. Aortic Atherosclerosis (ICD10-I70.0). Electronically Signed   By: Keith Rake M.D.   On: 01/03/2020 19:07   US RENAL  Result Date: 12/26/2019 CLINICAL DATA:  Acute kidney injury, stage III chronic kidney disease, COVID-19, hypertension, diabetes mellitus EXAM: RENAL / URINARY TRACT ULTRASOUND COMPLETE COMPARISON:  Abdominal ultrasound 01/28/2017 FINDINGS: Right Kidney: Renal measurements: 10.1 x 4.1 x 4.8 cm = volume: 104 mL. Mild cortical thinning. Upper normal cortical echogenicity. No mass or  hydronephrosis. Left Kidney: Renal measurements: 8.3 x 4.3 x 4.8  cm = volume: 89 mL. Cortical thinning. Increased cortical echogenicity. No mass, hydronephrosis or shadowing calcification. Bladder: Appears normal for degree of bladder distention. Other: N/A IMPRESSION: Cortical thinning and probable medical renal disease changes of both kidneys. No evidence of renal mass or hydronephrosis. Electronically Signed   By: Lavonia Dana M.D.   On: 12/26/2019 11:58   IR Fluoro Guide CV Line Right  Result Date: 01/06/2020 INDICATION: ACUTE RENAL FAILURE, RESPIRATORY FAILURE, COVID PNEUMONIA EXAM: ULTRASOUND GUIDANCE FOR VASCULAR ACCESS RIGHT IJ TEMPORARY DIALYSIS CATHETER MEDICATIONS: 1% LIDOCAINE LOCAL ANESTHESIA/SEDATION: Moderate Sedation Time: NONE. The patient's level of consciousness and vital signs were monitored continuously by radiology nursing throughout the procedure under my direct supervision. FLUOROSCOPY TIME:  Fluoroscopy Time: 1 minutes 49 seconds (14 mGy). COMPLICATIONS: None immediate. PROCEDURE: Informed written consent was obtained from the patient after a thorough discussion of the procedural risks, benefits and alternatives. All questions were addressed. Maximal Sterile Barrier Technique was utilized including caps, mask, sterile gowns, sterile gloves, sterile drape, hand hygiene and skin antiseptic. A timeout was performed prior to the initiation of the procedure. Under sterile conditions and local anesthesia, ultrasound micropuncture needle access performed of the right internal jugular vein. Guidewire advanced centrally. Micro dilator set advanced. Amplatz guidewire advanced into the IVC. Tract dilatation performed to insert a 20 cm temporary dialysis catheter. Tip SVC RA junction. Images obtained for documentation. Catheter secured Prolene sutures. Blood aspirated easily followed by saline and heparin flushes. Appropriate volume strength of heparin instilled in all lumens followed by external  caps and a sterile dressing. No immediate complication. Patient tolerated the procedure well. IMPRESSION: Successful ultrasound fluoroscopic right IJ temporary dialysis catheter. Access ready for use. Electronically Signed   By: Jerilynn Mages.  Shick M.D.   On: 01/06/2020 09:01   IR US Guide Vasc Access Right  Result Date: 01/06/2020 INDICATION: ACUTE RENAL FAILURE, RESPIRATORY FAILURE, COVID PNEUMONIA EXAM: ULTRASOUND GUIDANCE FOR VASCULAR ACCESS RIGHT IJ TEMPORARY DIALYSIS CATHETER MEDICATIONS: 1% LIDOCAINE LOCAL ANESTHESIA/SEDATION: Moderate Sedation Time: NONE. The patient's level of consciousness and vital signs were monitored continuously by radiology nursing throughout the procedure under my direct supervision. FLUOROSCOPY TIME:  Fluoroscopy Time: 1 minutes 49 seconds (14 mGy). COMPLICATIONS: None immediate. PROCEDURE: Informed written consent was obtained from the patient after a thorough discussion of the procedural risks, benefits and alternatives. All questions were addressed. Maximal Sterile Barrier Technique was utilized including caps, mask, sterile gowns, sterile gloves, sterile drape, hand hygiene and skin antiseptic. A timeout was performed prior to the initiation of the procedure. Under sterile conditions and local anesthesia, ultrasound micropuncture needle access performed of the right internal jugular vein. Guidewire advanced centrally. Micro dilator set advanced. Amplatz guidewire advanced into the IVC. Tract dilatation performed to insert a 20 cm temporary dialysis catheter. Tip SVC RA junction. Images obtained for documentation. Catheter secured Prolene sutures. Blood aspirated easily followed by saline and heparin flushes. Appropriate volume strength of heparin instilled in all lumens followed by external caps and a sterile dressing. No immediate complication. Patient tolerated the procedure well. IMPRESSION: Successful ultrasound fluoroscopic right IJ temporary dialysis catheter. Access ready for  use. Electronically Signed   By: Jerilynn Mages.  Shick M.D.   On: 01/06/2020 09:01   DG Chest Port 1 View  Result Date: 01/01/2020 CLINICAL DATA:  COVID-19 positive, hypoxia EXAM: PORTABLE CHEST 1 VIEW COMPARISON:  12/30/2019 FINDINGS: Single frontal view of the chest demonstrates stable dual lead pacer. Cardiac silhouette is unremarkable. Multifocal bilateral airspace disease is unchanged. No effusion or pneumothorax.  IMPRESSION: 1. Stable bilateral multifocal pneumonia. Electronically Signed   By: Randa Ngo M.D.   On: 01/01/2020 16:09   DG Chest Port 1 View  Result Date: 12/30/2019 CLINICAL DATA:  Pneumonia.  COVID. EXAM: PORTABLE CHEST 1 VIEW COMPARISON:  12/28/2019. FINDINGS: Cardiac pacer with lead tip over the right atrium right ventricle. Cardiomegaly. No pulmonary venous congestion. Bilateral pulmonary infiltrates, right side greater than left again noted without interim change. No pleural effusion or pneumothorax. IMPRESSION: 1. Cardiac pacer with lead tips over the right atrium right ventricle. Cardiomegaly. No pulmonary venous congestion. 2. Bilateral pulmonary infiltrates, right side greater than left again noted without interim change. Electronically Signed   By: Marcello Moores  Register   On: 12/30/2019 06:17   DG CHEST PORT 1 VIEW  Result Date: 12/28/2019 CLINICAL DATA:  Hypoxia EXAM: PORTABLE CHEST 1 VIEW COMPARISON:  December 22, 2019 FINDINGS: There is persistent airspace opacity in the right mid and lower lung regions as well as in the left base region. No new opacity evident. Heart is mildly enlarged with pacemaker leads attached to the right atrium and right ventricle. No appreciable adenopathy. No bone lesions. No pneumothorax. IMPRESSION: Airspace opacity bilaterally, more on the right than on the left, essentially stable. Stable cardiac prominence with pacemaker leads present in the right atrium and right ventricle regions. Electronically Signed   By: Lowella Grip III M.D.   On: 12/28/2019  08:13   DG Chest Port 1 View  Result Date: 12/13/2019 CLINICAL DATA:  COVID, hypoxic EXAM: PORTABLE CHEST 1 VIEW COMPARISON:  05/16/2016 FINDINGS: Left pacer remains in place, unchanged. Cardiomegaly. Patchy airspace opacities throughout the right lung and in the left base paddle with pneumonia. No effusions. No acute bony abnormality. IMPRESSION: Patchy bilateral airspace opacities, right greater than left compatible with pneumonia, likely COVID pneumonia. Electronically Signed   By: Rolm Baptise M.D.   On: 12/31/2019 21:51   DG Chest Port 1V same Day  Result Date: 01/06/2020 CLINICAL DATA:  Shortness of breath, COVID positive EXAM: PORTABLE CHEST 1 VIEW COMPARISON:  01/01/2020 FINDINGS: Interval placement of large bore right neck multi lumen vascular catheter, tip positioned near the superior cavoatrial junction. Interval increase in diffuse bilateral interstitial heterogeneous airspace opacity. Cardiomegaly with left chest multi lead pacer. IMPRESSION: 1. Interval placement of large bore right neck multi lumen vascular catheter, tip positioned near the superior cavoatrial junction. 2. Interval increase in diffuse bilateral interstitial heterogeneous airspace opacity, consistent with worsened COVID airspace disease. Electronically Signed   By: Eddie Candle M.D.   On: 01/06/2020 08:13   DG Abd Portable 2V  Result Date: 01/03/2020 CLINICAL DATA:  Nausea vomiting, weakness. EXAM: PORTABLE ABDOMEN - 2 VIEW COMPARISON:  01/01/2020 FINDINGS: There is a left chest wall pacer device with lead in the right atrial appendage and right ventricle. The bowel gas pattern appears nonobstructive. No dilated small bowel loops. Gas and stool noted within the colon. Degenerative disc disease identified within the lumbar spine. Mild bilateral hip osteoarthritis also noted. IMPRESSION: Nonobstructive bowel gas pattern. Electronically Signed   By: Kerby Moors M.D.   On: 01/03/2020 09:55   ECHOCARDIOGRAM  COMPLETE  Result Date: 12/25/2019    ECHOCARDIOGRAM REPORT   Patient Name:   LORYNN MOESER Novak Date of Exam: 12/25/2019 Medical Rec #:  [id number]      Height:       63.0 in Accession #:    [id number]     Weight:       197.4 lb Date  of Birth:  May 07, 1935      BSA:          1.923 m Patient Age:    49 years       BP:           108/58 mmHg Patient Gender: F              HR:           70 bpm. Exam Location:  Inpatient Procedure: 2D Echo and Strain Analysis Indications:    Elevated brain natriuretic peptide (BNP) level [[id number]]  History:        Patient has prior history of Echocardiogram examinations, most                 recent 04/22/2016. Pacemaker, Arrythmias:Atrial Fibrillation;                 Risk Factors:Hypertension, Diabetes and Dyslipidemia. Chronic                 renal disease stage IV, Covid Pneumonia, Acute hypoxic                 respiratory failure.  Sonographer:    Darlina Sicilian RDCS Referring Phys: [phone] A CALDWELL POWELL [place]  1. Left ventricular ejection fraction, by estimation, is 50 to 55%. The left ventricle has low normal function. The left ventricle has no regional wall motion abnormalities. There is mild concentric left ventricular hypertrophy. Left ventricular diastolic function could not be evaluated. The average left ventricular global longitudinal strain is -7.1 %.  2. Right ventricular systolic function is mildly reduced. The right ventricular size is mildly enlarged. There is normal pulmonary artery systolic pressure. The estimated right ventricular systolic pressure is 54.2 mmHg.  3. Left atrial size was mildly dilated.  4. The mitral valve is degenerative. Mild mitral valve regurgitation. No evidence of mitral stenosis.  5. The aortic valve is tricuspid. Aortic valve regurgitation is not visualized. No aortic stenosis is present.  6. The inferior vena cava is normal in size with greater than 50% respiratory variability, suggesting right atrial pressure of 3 mmHg. Comparison(s):  Prior images unable to be directly viewed, comparison made by report only. No significant change from prior study. FINDINGS  Left Ventricle: Left ventricular ejection fraction, by estimation, is 50 to 55%. The left ventricle has low normal function. The left ventricle has no regional wall motion abnormalities. The average left ventricular global longitudinal strain is -7.1 %.  The left ventricular internal cavity size was normal in size. There is mild concentric left ventricular hypertrophy. Left ventricular diastolic function could not be evaluated due to atrial fibrillation. Left ventricular diastolic function could not be evaluated. Right Ventricle: The right ventricular size is mildly enlarged. No increase in right ventricular wall thickness. Right ventricular systolic function is mildly reduced. There is normal pulmonary artery systolic pressure. The tricuspid regurgitant velocity  is 2.27 m/s, and with an assumed right atrial pressure of 3 mmHg, the estimated right ventricular systolic pressure is 70.6 mmHg. Left Atrium: Left atrial size was mildly dilated. Right Atrium: Right atrial size was normal in size. Pericardium: Trivial pericardial effusion is present. Mitral Valve: The mitral valve is degenerative in appearance. Mild mitral annular calcification. Mild mitral valve regurgitation. No evidence of mitral valve stenosis. Tricuspid Valve: The tricuspid valve is grossly normal. Tricuspid valve regurgitation is mild . No evidence of tricuspid stenosis. Aortic Valve: The aortic valve is tricuspid. Aortic valve regurgitation is not visualized. No aortic  stenosis is present. Pulmonic Valve: The pulmonic valve was grossly normal. Pulmonic valve regurgitation is mild. No evidence of pulmonic stenosis. Aorta: The aortic root and ascending aorta are structurally normal, with no evidence of dilitation. Venous: The inferior vena cava is normal in size with greater than 50% respiratory variability, suggesting right  atrial pressure of 3 mmHg. IAS/Shunts: The atrial septum is grossly normal. Additional Comments: A pacer wire is visualized in the right atrium and right ventricle.   LV Volumes (MOD) LV vol d, MOD A2C: 88.6 ml LV vol d, MOD A4C: 85.2 ml 2D Longitudinal Strain LV vol s, MOD A2C: 47.3 ml 2D Strain GLS Avg:     -7.1 % LV vol s, MOD A4C: 44.7 ml LV SV MOD A2C:     41.3 ml LV SV MOD A4C:     85.2 ml LV SV MOD BP:      41.7 ml RIGHT ATRIUM           Index RA Area:     15.30 cm RA Volume:   38.00 ml  19.76 ml/m  TRICUSPID VALVE TR Peak grad:   20.6 mmHg TR Vmax:        227.00 cm/s Eleonore Chiquito MD Electronically signed by Eleonore Chiquito MD Signature Date/Time: 12/25/2019/10:32:44 AM    Final

## 2020-01-06 NOTE — Progress Notes (Signed)
[facility] Progress Note    Assessment/ Plan:   Non-Oliguric AKI (stable) on CKD 4: -baseline Cr 1.8-2.2. F/b [facility].  Underlying CKD secondary to longstanding DM and HTN.  AKI Likely secondary to COVID infection +/- hemodynamic instability.  - admit creat 1.7 on 8/12   - no obstruction on renal ultrasound: cortical thinning+ Edison Pace bilat - diuresed x 48hrs for ^'d SOB - creat was improving then worsened, pt uremic w/ nausea and ^^BUN/ ^Cr > plan acute HD to start today and HD tomorrow as well - BP's are soft, will add midodrine to assist w/ vol removal  COVID-19 Pneumonia/AHRF -sp remdesivir, steroids. Completed empiric abx -mgmt per primary service. On supplemental O2  Anasarca/ hypoalbuminemia  -sig edema on exam   Non-gap Metabolic Acidosis, improved -corrected anion gap 15 -likely related to AKI , up 9-10 kg from admission > try to pull fluid w/ HD   Hypertension: -on toprol xl and cardizem, see below   Afib -per primary team -rate control is on long-acting dilt/ BB > will change to short acting and reduce by 50% due to lower BP's and need for vol removal  Uncontrolled Diabetes Mellitus Type 2 with Hyperglycemia -mgmt per primary service   Kelly Splinter, MD 01/06/2020, 10:44 AM       Subjective:    Nausea okay, breathing "not good", B/ Cr cont to rise, no UOP recorded  Physical Exam: alert, pale and fatigued looking  no jvd  Chest bilat rales at bases post  Cor reg no RG  Abd soft ntnd no ascites   Ext diffuse 2-3+ edema x 4   Alert, NF, ox3    3 L UOP yest Objective:   BP 117/74   Pulse 96   Temp (!) 97.1 F (36.2 C) (Axillary)   Resp 18   Ht 5\' 3"  (1.6 m)   Wt 104 kg   SpO2 96%   BMI 40.60 kg/m   Intake/Output Summary (Last 24 hours) at 01/06/2020 1044 Last data filed at 01/06/2020 0154 Gross per 24 hour  Intake 476 ml  Output --  Net 476 ml   Weight change:       Labs: BMET Recent Labs  Lab 12/31/19 0436  01/01/20 0628 01/02/20 0253 01/03/20 0453 01/04/20 0358 01/05/20 0352 01/06/20 0726  NA 138 138 141 141 143 139 136  K 4.8 5.0 4.6 5.0 4.6 4.8 5.5*  CL 107 104 104 100 104 102 99  CO2 19* 22 22 26 24 24 23   GLUCOSE 88 195* 205* 151* 81 105* 106*  BUN 114* 114* 113* 117* 127* 157* 168*  CREATININE 3.22* 3.03* 2.72* 2.63* 3.14* 3.80* 4.23*  CALCIUM 7.9* 8.9 9.4 9.4 8.7* 8.1* 8.0*   CBC Recent Labs  Lab 01/02/20 0253 01/04/20 0358 01/05/20 0352 01/06/20 0726  WBC 23.2* 32.3* 29.2* 23.5*  HGB 13.3 13.2 11.4* 10.8*  HCT 40.1 39.7 34.0* 32.6*  MCV 95.2 95.2 95.2 93.9  PLT 304 165 124* PLATELET CLUMPS NOTED ON SMEAR, COUNT APPEARS DECREASED    Medications:    . atorvastatin  80 mg Oral QHS  . benzonatate  100 mg Oral TID  . Chlorhexidine Gluconate Cloth  6 each Topical Daily  . diltiazem  360 mg Oral Daily  . feeding supplement (ENSURE ENLIVE)  237 mL Oral TID BM  . insulin aspart  0-20 Units Subcutaneous TID WC  . insulin aspart  0-5 Units Subcutaneous QHS  . [START ON 01/07/2020] insulin glargine  15 Units  Subcutaneous Daily  . levothyroxine  50 mcg Oral Daily  . linagliptin  5 mg Oral Daily  . methylPREDNISolone (SOLU-MEDROL) injection  40 mg Intravenous Q12H  . metoprolol succinate  25 mg Oral Daily  . midodrine  10 mg Oral BID WC  . sodium bicarbonate  650 mg Oral TID

## 2020-01-06 NOTE — TOC Progression Note (Addendum)
Transition of Care [facility]) - Progression Note    Patient Details  Name: Kathryn Richardson MRN: [id number] Date of Birth: June 22, 1934  Transition of Care [facility] CM/SW Contact  Ross Ludwig,  Phone Number: 01/04/20, 2:57 PM  Clinical Narrative:     Patient's daughter contacted CSW and requested [facility] as the SNF choice.  CSW spoke to [facility] and they will hold a bed for patient once she is medically ready for discharge.  CSW to continue to follow patient's progress throughout discharge planning.   Expected Discharge Plan: [place] Barriers to Discharge: Continued Medical Work up  Expected Discharge Plan and Services Expected Discharge Plan: [place] In-house Referral: NA   Post Acute Care Choice: [place] Living arrangements for the past 2 months: Single Family Home                   DME Agency: NA                   Social Determinants of Health (SDOH) Interventions    Readmission Risk Interventions No flowsheet data found.

## 2020-01-06 NOTE — Progress Notes (Signed)
ANTICOAGULATION CONSULT NOTE - Initial Consult  Pharmacy Consult for Heparin  Indication: atrial fibrillation  Allergies  Allergen Reactions  . Cortisone Other (See Comments)    Hyperglycemia   . Prednisone Other (See Comments)    Hyperglycemia     Patient Measurements: Height: 5\' 3"  (160 cm) Weight: 104 kg (229 lb 4.5 oz) IBW/kg (Calculated) : 52.4 Heparin Dosing Weight: 77 kg  Vital Signs: Temp: 97.7 F (36.5 C) (08/27 1408) Temp Source: Oral (08/27 1408) BP: 96/48 (08/27 1500) Pulse Rate: 75 (08/27 1500)  Labs: Recent Labs    01/04/20 0358 01/04/20 0358 01/05/20 0352 01/06/20 0726 01/06/20 1412  HGB 13.2   < > 11.4* 10.8*  --   HCT 39.7  --  34.0* 32.6*  --   PLT 165  --  124* PLATELET CLUMPS NOTED ON SMEAR, COUNT APPEARS DECREASED  --   APTT  --   --   --   --  111*  HEPARINUNFRC  --   --   --   --  >2.20*  CREATININE 3.14*  --  3.80* 4.23*  --    < > = values in this interval not displayed.    Estimated Creatinine Clearance: 11.6 mL/min (A) (by C-G formula based on SCr of 4.23 mg/dL (H)).   Medical History: Past Medical History:  Diagnosis Date  . Allergic rhinitis   . Atrial fibrillation ([place])   . Chronic renal disease, stage IV ([place])   . Essential hypertension   . GERD (gastroesophageal reflux disease)   . Gout   . Hyperlipemia, mixed   . Insomnia   . Type 2 diabetes mellitus (HCC)     Medications:  Medications Prior to Admission  Medication Sig Dispense Refill Last Dose  . acetaminophen (TYLENOL) 500 MG tablet Take 500 mg by mouth every 6 (six) hours as needed for mild pain, moderate pain or headache.   01/10/2020 at Unknown time  . albuterol (VENTOLIN HFA) 108 (90 Base) MCG/ACT inhaler Inhale 1 puff into the lungs every 6 (six) hours as needed for shortness of breath.   Past Week at Unknown time  . allopurinol (ZYLOPRIM) 100 MG tablet Take 200 mg by mouth at bedtime.    01/03/2020 at Unknown time  . atorvastatin (LIPITOR) 80 MG tablet Take 80  mg by mouth at bedtime.    12/21/2019 at Unknown time  . benazepril (LOTENSIN) 20 MG tablet Take 20 mg by mouth daily.   01/09/2020 at Unknown time  . diltiazem (CARDIZEM CD) 360 MG 24 hr capsule TAKE 1 CAPSULE BY MOUTH EVERY DAY (Patient taking differently: Take 360 mg by mouth daily. ) 90 capsule 2 12/23/2019 at Unknown time  . diltiazem (CARDIZEM) 30 MG tablet Take 1 tablet every 4 hours AS NEEDED for afib heart rate 90 or greater. (Patient taking differently: Take 30 mg by mouth every 4 (four) hours as needed. Take 1 tablet every 4 hours AS NEEDED for afib heart rate 90 or greater.) 90 tablet 2 12/20/2019 at Unknown time  . ELIQUIS 2.5 MG TABS tablet TAKE 1 TABLET BY MOUTH TWICE A DAY (Patient taking differently: Take 2.5 mg by mouth 2 (two) times daily. ) 60 tablet 5 12/20/2019 at 8am  . furosemide (LASIX) 40 MG tablet Take 1.5 tablets (60 mg total) by mouth daily. 270 tablet 3 12/21/2019 at Unknown time  . KLOR-CON M20 20 MEQ tablet TAKE 1.5 TABLETS BY MOUTH EVERY DAY (Patient taking differently: Take 30 mEq by mouth daily. ) 45  tablet 4 12/29/2019 at Unknown time  . LANTUS SOLOSTAR 100 UNIT/ML Solostar Pen Inject 62 Units into the skin daily.   12/20/2019 at Unknown time  . levothyroxine (SYNTHROID, LEVOTHROID) 50 MCG tablet Take 50 mcg by mouth daily. On a empty stomach.   12/28/2019 at Unknown time  . metoprolol succinate (TOPROL XL) 25 MG 24 hr tablet Take 0.5 tablets (12.5 mg total) by mouth in the morning and at bedtime. 90 tablet 3 01/05/2020 at 8am  . rosuvastatin (CRESTOR) 20 MG tablet Take 20 mg by mouth daily.    12/24/2019 at Unknown time    Assessment: 85 y.o female   STAT baseline  aPTT = 111 seconds and HL >2.2 prior to start of heparin. HL elevated due to previously taking Apixaban.  Last took apixaban on 8/25 @ 0952.  Baseline aPTT of 111 seconds is higher than our target lower therapeutic range of 66-85 seconds when we monitor heparin drip rate.  Prior apixaban should not increase the  aPTT. Not currently receiving any heparin, no documentation that any heparn given and confirmed with hemodialysis nurse today that no heparin was given prior to blood draw for this PTT/HL.   LFTs wnl  I have discussed this with Dr. Sloan Leiter.  Will be difficult to monitor on IV heparin drip when aPTT at baseline already greater than our goal.  MD recommends to repeat aPTT.  We will repeat the PTT/HL as a peripheral blood draw after finishes this hemodialysis session and determine plan after results reviewed.   Goal of Therapy:  APTT 66-85 seconds Heparin level 0.3-0.5 (once effect of apixaban depleted)  Monitor platelets by anticoagulation protocol: Yes   Plan:  Repeat baseline aPTT and HL peripheral stick after  finishes this hemodialysis session.   Thank you for allowing pharmacy to be part of this patients care team. Nicole Cella, RPh Clinical Pharmacist Please check AMION for all [place] phone numbers After 10:00 PM, call [place] [phone] 01/06/2020,3:22 PM

## 2020-01-06 NOTE — Progress Notes (Signed)
ANTICOAGULATION CONSULT NOTE - Consult  Pharmacy Consult for Heparin  Indication: atrial fibrillation  Allergies  Allergen Reactions  . Cortisone Other (See Comments)    Hyperglycemia   . Prednisone Other (See Comments)    Hyperglycemia     Patient Measurements: Height: 5\' 3"  (160 cm) Weight: 104 kg (229 lb 4.5 oz) IBW/kg (Calculated) : 52.4 Heparin Dosing Weight: 77 kg  Vital Signs: Temp: 97.9 F (36.6 C) (08/27 2000) Temp Source: Oral (08/27 2000) BP: 104/56 (08/27 2000) Pulse Rate: 74 (08/27 2000)  Labs: Recent Labs    01/04/20 0358 01/04/20 0358 01/05/20 0352 01/06/20 0726 01/06/20 1412 01/06/20 1538 01/06/20 1846  HGB 13.2   < > 11.4* 10.8*  --   --   --   HCT 39.7  --  34.0* 32.6*  --   --   --   PLT 165  --  124* PLATELET CLUMPS NOTED ON SMEAR, COUNT APPEARS DECREASED  --   --   --   APTT  --   --   --   --  111*  --  29  LABPROT  --   --   --   --   --  17.1*  --   INR  --   --   --   --   --  1.5*  --   HEPARINUNFRC  --   --   --   --  >2.20*  --   --   CREATININE 3.14*  --  3.80* 4.23*  --   --   --    < > = values in this interval not displayed.    Estimated Creatinine Clearance: 11.6 mL/min (A) (by C-G formula based on SCr of 4.23 mg/dL (H)).   Medical History: Past Medical History:  Diagnosis Date  . Allergic rhinitis   . Atrial fibrillation ([place])   . Chronic renal disease, stage IV ([place])   . Essential hypertension   . GERD (gastroesophageal reflux disease)   . Gout   . Hyperlipemia, mixed   . Insomnia   . Type 2 diabetes mellitus (HCC)     Medications:  Medications Prior to Admission  Medication Sig Dispense Refill Last Dose  . acetaminophen (TYLENOL) 500 MG tablet Take 500 mg by mouth every 6 (six) hours as needed for mild pain, moderate pain or headache.   12/30/2019 at Unknown time  . albuterol (VENTOLIN HFA) 108 (90 Base) MCG/ACT inhaler Inhale 1 puff into the lungs every 6 (six) hours as needed for shortness of breath.   Past Week  at Unknown time  . allopurinol (ZYLOPRIM) 100 MG tablet Take 200 mg by mouth at bedtime.    12/21/2019 at Unknown time  . atorvastatin (LIPITOR) 80 MG tablet Take 80 mg by mouth at bedtime.    12/21/2019 at Unknown time  . benazepril (LOTENSIN) 20 MG tablet Take 20 mg by mouth daily.   12/30/2019 at Unknown time  . diltiazem (CARDIZEM CD) 360 MG 24 hr capsule TAKE 1 CAPSULE BY MOUTH EVERY DAY (Patient taking differently: Take 360 mg by mouth daily. ) 90 capsule 2 12/15/2019 at Unknown time  . diltiazem (CARDIZEM) 30 MG tablet Take 1 tablet every 4 hours AS NEEDED for afib heart rate 90 or greater. (Patient taking differently: Take 30 mg by mouth every 4 (four) hours as needed. Take 1 tablet every 4 hours AS NEEDED for afib heart rate 90 or greater.) 90 tablet 2 12/15/2019 at Unknown time  . ELIQUIS  2.5 MG TABS tablet TAKE 1 TABLET BY MOUTH TWICE A DAY (Patient taking differently: Take 2.5 mg by mouth 2 (two) times daily. ) 60 tablet 5 01/07/2020 at 8am  . furosemide (LASIX) 40 MG tablet Take 1.5 tablets (60 mg total) by mouth daily. 270 tablet 3 12/19/2019 at Unknown time  . KLOR-CON M20 20 MEQ tablet TAKE 1.5 TABLETS BY MOUTH EVERY DAY (Patient taking differently: Take 30 mEq by mouth daily. ) 45 tablet 4 01/09/2020 at Unknown time  . LANTUS SOLOSTAR 100 UNIT/ML Solostar Pen Inject 62 Units into the skin daily.   01/07/2020 at Unknown time  . levothyroxine (SYNTHROID, LEVOTHROID) 50 MCG tablet Take 50 mcg by mouth daily. On a empty stomach.   01/10/2020 at Unknown time  . metoprolol succinate (TOPROL XL) 25 MG 24 hr tablet Take 0.5 tablets (12.5 mg total) by mouth in the morning and at bedtime. 90 tablet 3 12/31/2019 at 8am  . rosuvastatin (CRESTOR) 20 MG tablet Take 20 mg by mouth daily.    01/06/2020 at Unknown time    Assessment: 85 y.o female with PMH of PAF on apixaban, admitted acute hypoxic respiratory failure due to COVID-19 pneumonia. Pharmacy consulted to start heparin. Baseline HL 1.52 due to  Apixaban PTA, last dose on 8/25 @ 0952.  Baseline aPTT 29  Goal of Therapy:  APTT 66-85 seconds Heparin level 0.3-0.5 (once effect of apixaban depleted)  Monitor platelets by anticoagulation protocol: Yes   Plan:  Start heparin infusion at 1100 units/hr Check anti-Xa level in 6 hours and daily while on heparin Continue to monitor H&H and platelets   Thank you for allowing [place] to participate in this patients care.   Jens Som, PharmD Please see amion for complete clinical pharmacist phone list. 01/06/2020 8:25 PM

## 2020-01-06 NOTE — Plan of Care (Signed)
  Problem: Education: Goal: Knowledge of risk factors and measures for prevention of condition will improve Outcome: Progressing   Problem: Coping: Goal: Psychosocial and spiritual needs will be supported Outcome: Progressing Patient agreeing to DNR status today.   Problem: Respiratory: Goal: Complications related to the disease process, condition or treatment will be avoided or minimized Outcome: Progressing  Plan for starting HD tomorrow. Patient with 3+ edema in both arms, 2+ edema in legs.

## 2020-01-07 LAB — CBC
HCT: 28.2 % — ABNORMAL LOW (ref 36.0–46.0)
Hemoglobin: 9.6 g/dL — ABNORMAL LOW (ref 12.0–15.0)
MCH: 32.1 pg (ref 26.0–34.0)
MCHC: 34 g/dL (ref 30.0–36.0)
MCV: 94.3 fL (ref 80.0–100.0)
Platelets: 84 10*3/uL — ABNORMAL LOW (ref 150–400)
RBC: 2.99 MIL/uL — ABNORMAL LOW (ref 3.87–5.11)
RDW: 15.4 % (ref 11.5–15.5)
WBC: 23.7 10*3/uL — ABNORMAL HIGH (ref 4.0–10.5)
nRBC: 0 % (ref 0.0–0.2)

## 2020-01-07 LAB — HEPATITIS B SURFACE ANTIBODY, QUANTITATIVE: Hep B S AB Quant (Post): <3.1 m[IU]/mL — ABNORMAL LOW (ref >9.9)

## 2020-01-07 LAB — COMPREHENSIVE METABOLIC PANEL
ALT: 26 U/L (ref 0–44)
AST: 32 U/L (ref 15–41)
Albumin: 1.9 g/dL — ABNORMAL LOW (ref 3.5–5.0)
Alkaline Phosphatase: 53 U/L (ref 38–126)
Anion gap: 11 (ref 5–15)
BUN: 95 mg/dL — ABNORMAL HIGH (ref 8–23)
CO2: 26 mmol/L (ref 22–32)
Calcium: 7.4 mg/dL — ABNORMAL LOW (ref 8.9–10.3)
Chloride: 98 mmol/L (ref 98–111)
Creatinine, Ser: 3.19 mg/dL — ABNORMAL HIGH (ref 0.44–1.00)
GFR calc Af Amer: 15 mL/min — ABNORMAL LOW (ref >60)
GFR calc non Af Amer: 13 mL/min — ABNORMAL LOW (ref >60)
Glucose, Bld: 165 mg/dL — ABNORMAL HIGH (ref 70–99)
Potassium: 4.7 mmol/L (ref 3.5–5.1)
Sodium: 135 mmol/L (ref 135–145)
Total Bilirubin: 1.1 mg/dL (ref 0.3–1.2)
Total Protein: 4.5 g/dL — ABNORMAL LOW (ref 6.5–8.1)

## 2020-01-07 LAB — GLUCOSE, CAPILLARY
Glucose-Capillary: 135 mg/dL — ABNORMAL HIGH (ref 70–99)
Glucose-Capillary: 137 mg/dL — ABNORMAL HIGH (ref 70–99)
Glucose-Capillary: 99 mg/dL (ref 70–99)
Glucose-Capillary: 143 mg/dL — ABNORMAL HIGH (ref 70–99)

## 2020-01-07 LAB — D-DIMER, QUANTITATIVE: D-Dimer, Quant: 5.01 ug/mL-FEU — ABNORMAL HIGH (ref 0.00–0.50)

## 2020-01-07 LAB — HEPARIN LEVEL (UNFRACTIONATED): Heparin Unfractionated: 1.68 IU/mL — ABNORMAL HIGH (ref 0.30–0.70)

## 2020-01-07 LAB — C-REACTIVE PROTEIN: CRP: 5.3 mg/dL — ABNORMAL HIGH (ref <1.0)

## 2020-01-07 LAB — APTT: aPTT: 78 seconds — ABNORMAL HIGH (ref 24–36)

## 2020-01-07 MED ORDER — MIDODRINE HCL 5 MG PO TABS
10.0000 mg | ORAL_TABLET | Freq: Three times a day (TID) | ORAL | Status: DC
  Administered 2020-01-07 – 2020-01-08 (×3): 10 mg via ORAL
  Filled 2020-01-07 (×3): qty 2

## 2020-01-07 MED ORDER — ALBUMIN HUMAN 25 % IV SOLN: 50.0000 g | Freq: Once | INTRAVENOUS | Status: AC

## 2020-01-07 MED ORDER — ALBUMIN HUMAN 25 % IV SOLN
INTRAVENOUS | Status: AC
  Administered 2020-01-07: 50 g via INTRAVENOUS
  Filled 2020-01-07: qty 200

## 2020-01-07 MED ORDER — MIDODRINE HCL 5 MG PO TABS: 10.0000 mg | ORAL_TABLET | Freq: Once | ORAL | Status: AC

## 2020-01-07 MED ORDER — MIDODRINE HCL 5 MG PO TABS
ORAL_TABLET | ORAL | Status: AC
  Administered 2020-01-07: 10 mg via ORAL
  Filled 2020-01-07: qty 2

## 2020-01-07 NOTE — Progress Notes (Addendum)
PROGRESS NOTE                                                                                                                                                                                                             Patient Demographics:    Kathryn Richardson, is a 85 y.o. female, DOB - 1934-06-10, IDP:[id number]  Outpatient Primary MD for the patient is [name], MD   Admit date - 12/25/2019   LOS - 59  Chief Complaint  Patient presents with  . Covid positive       Brief Narrative: Patient is a 85 y.o. female with PMHx of CKD stage IV, DM-2, HTN, HLD, PAF on anticoagulation-diagnosed with COVID-19 on 8/5-presented to [facility] on 8/12 for worsening shortness of breath-found to have acute hypoxic respiratory failure due to COVID-19 pneumonia.  Admitted to the hospitalist service-and started on steroids/remdesivir and other supportive care.  Unfortunately further hospital course complicated by worsening hypoxemia requiring 15 L of HFNC and worsening AKI with anasarca.  Transferred to [facility] on 8/26-for initiation of HD. See below for further details  COVID-19 vaccinated status: Vaccinated  Significant Events: 8/5>> COVID-19 positive 8/12>> admit for hypoxia from COVID-19 pneumonia. 8/26>> transfer to [facility] for HD.  Significant studies: 8/12>>Chest x-ray: Patchy bilateral airspace opacities 8/15>> Echo: EF 44-31%, RV systolic function is mildly reduced 8/18>> chest x-ray: Airspace opacity bilaterally more on the right than on the left. 8/22>> chest x-ray: Stable bilateral multifocal pneumonia 8/24>> CT chest: Bilateral groundglass opacities within both lungs, retained mucus in the dependent trachea extending to the right mainstem bronchus, small bilateral pleural effusions. 8/24>> CT abdomen/pelvis: Small volume abdominal pelvic ascites, generalized body wall edema, sigmoid diverticulosis without diverticulitis, sludge in the  gallbladder 8/27>> chest x-ray: Increasing diffuse bilateral interstitial heterogeneous or space opacity  COVID-19 medications: Steroids: 8/12>> Remdesivir: 8/12>> 8/16 Casirivimab-imdevimab: 8/10 x 1  Antibiotics: Rocephin: 8/14>> 8/18 Zithromax: 8/14>> 8/18  Microbiology data: 8/12 >>blood culture: No growth  Procedures: 8/26>> right IJ temporary HD catheter by IR  Consults: None  DVT prophylaxis: SCDs Start: 12/23/19 2158    Subjective:   Remains on 15 L of HFNC-looks frail/weak and debilitated.  No significant improvement overnight.  Had HD yesterday-volume removal limited by hypotension.   Assessment  & Plan :   Acute Hypoxic Resp Failure due to breakthrough Covid 19 infection  with pneumonia: Continues to have severe hypoxemia-remains on 15 L of HFNC.  Probably in the fibrotic stage of ARDS.  However could have some element of volume overload contributing to hypoxemia.  Remains on steroids-but will taper slightly-nephrology following-with plans for second round of HD today-unfortunately volume removal limited by hypotension..  Have had extensive discussion with patient/daughter-both aware that really no further role for any escalation in care beyond what we are doing.  DNR in place.  If patient deteriorates significantly apart from transitioning to comfort care-really no other options.  Fever: afebrile O2 requirements:  SpO2: 100 % O2 Flow Rate (L/min): 15 L/min FiO2 (%): 100 %   COVID-19 Labs: Recent Labs    01/06/20 0726 01/07/20 0118  DDIMER 5.89* 5.01*  CRP 9.0* 5.3*       Component Value Date/Time   BNP 211.4 (H) 12/26/2019 0448    No results for input(s): PROCALCITON in the last 168 hours.  Lab Results  Component Value Date   SARSCOV2NAA POSITIVE (A) 12/15/2019   [place] NEGATIVE 08/13/2019     Prone/Incentive Spirometry: encouragedincentive spirometry use 3-4/hour.  Elevated D-dimer: Probably secondary to COVID-19 infection-insertion of  HD catheter.  Hypoxia has remained stable over the past few days.  Eliquis held due to mild hematochezia 2-3 days back while at [facility].  Have started IV heparin-no evidence of bleeding.  AKI on CKD stage IV: AKI hemodynamically mediated due to COVID-19 infection-remains massively volume overloaded with generalized anasarca.  HD started on 8/27-with plans to repeat HD on 8/28.  Unfortunately volume removal limited due to hypotension.  Scheduled Cardizem/beta-blocker discontinued-midodrine dosage adjusted-we will see if she is able to tolerate HD over the next few days.  Spoke with nephrology MD-Dr. Linton Ham is not a candidate for long-term HD (agree)-if no renal recovery-then apart from hospice care-really no other options.  I have updated family-daughter Deb on 8/28 regarding challenges with HD-and that patient is not a outpatient HD candidate.  Anasarca: Due to hypoalbuminemia and renal failure.  See above regarding issues with hypotension with HD.  [place] metabolic acidosis: Improved-likely due to AKI.  Persistent atrial fibrillation: Rate controlled-will stop both Cardizem and metoprolol given hypotension with HD.  On IV heparin-Eliquis remains on hold.  Continue to monitor closely.  History of sick sinus syndrome: PPM in place-telemetry monitoring continues.  Transient hematochezia: Occurred 2-3 days back while at [facility]-but has since resolved.  Apparently occurred after laxatives were given for constipation.  IV heparin started on 8/27-no further hematochezia observed.  DM-2 (A1c 10.4 on 8/15) with uncontrolled hyperglycemia due to steroids: CBGs relatively stable-continue Lantus 15 units and SSI.  Follow and adjust.     Recent Labs    01/06/20 2115 01/07/20 0728 01/07/20 1136  GLUCAP 158* 135* 137*   Hypothyroidism: Continue Synthroid  Anxiety: Relatively stable-secondary to acute illness-continue as needed Xanax  Constipation/diarrhea: Had constipation-given laxatives and then  briefly had diarrhea.  Will maintain on a bowel regimen with MiraLAX-and titrate accordingly.  Debility/deconditioning: Secondary to acute illness-PT/OT following.  Palliative care discussion: Have had multiple discussions with patient-and daughter Neoma Laming over the phone on 8/27 and 8/28.  Patient aware that maximal medical therapy is currently being provided-with very little benefit in any further escalation in care- will likely have poor outcome with intubation/mechanical ventilation. Nephrology has deemed the patient not to be a candidate for outpatient HD.  Plan is to monitor for a few days-hope for respiratory/renal improvement-however if any significant deterioration-or if no significant improvement over the  next few days-may be reasonable to transition to comfort measures.  DNR in place.  Morbid Obesity: Estimated body mass index is 40.42 kg/m as calculated from the following:   Height as of this encounter: 5\' 3"  (1.6 m).   Weight as of this encounter: 103.5 kg.   RN pressure injury documentation:    GI prophylaxis: PPI  ABG:    Component Value Date/Time   HCO3 23.9 12/24/2019 0502   TCO2 27 12/21/2019 2247   O2SAT 83.7 12/24/2019 0502    Vent Settings: N/A  Condition - Extremely Guarded-very tenuous with risk for further deterioration  Family Communication  :  Daughter-Deb [phone] -on 8/28  Code Status :  DNR  Diet :  Diet Order            Diet heart healthy/carb modified Room service appropriate? Yes; Fluid consistency: Thin; Fluid restriction: 1200 mL Fluid  Diet effective now                  Disposition Plan  :   Status is: Inpatient  Remains inpatient appropriate because:Inpatient level of care appropriate due to severity of illness  Dispo:  Patient From: [place]  Planned Disposition: [facility]  Expected discharge date: TBD  Medically stable for discharge: No    Barriers to discharge: Hypoxia requiring O2  supplementation-AKI requiring HD  Antimicorbials  :    Anti-infectives (From admission, onward)   Start     Dose/Rate Route Frequency Ordered Stop   12/24/19 1000  cefTRIAXone (ROCEPHIN) 2 g in sodium chloride 0.9 % 100 mL IVPB        2 g 200 mL/hr over 30 Minutes Intravenous Every 24 hours 12/24/19 0843 12/28/19 1001   12/24/19 1000  azithromycin (ZITHROMAX) 500 mg in sodium chloride 0.9 % 250 mL IVPB        500 mg 250 mL/hr over 60 Minutes Intravenous Every 24 hours 12/24/19 0843 12/28/19 1104   12/23/19 1000  remdesivir 100 mg in sodium chloride 0.9 % 100 mL IVPB       "Followed by" Linked Group Details   100 mg 200 mL/hr over 30 Minutes Intravenous Daily 12/29/2019 2317 12/26/19 0842   12/23/19 0001  remdesivir 100 mg in sodium chloride 0.9 % 100 mL IVPB       "Followed by" Linked Group Details   100 mg 200 mL/hr over 30 Minutes Intravenous  Once 12/13/2019 2317 12/23/19 0224   12/29/2019 2330  remdesivir 100 mg in sodium chloride 0.9 % 100 mL IVPB       "Followed by" Linked Group Details   100 mg 200 mL/hr over 30 Minutes Intravenous  Once 01/06/2020 2317 12/23/19 0052      Inpatient Medications  Scheduled Meds: . atorvastatin  80 mg Oral QHS  . benzonatate  100 mg Oral TID  . Chlorhexidine Gluconate Cloth  6 each Topical Daily  . diltiazem  60 mg Oral Q8H  . feeding supplement (ENSURE ENLIVE)  237 mL Oral TID BM  . insulin aspart  0-20 Units Subcutaneous TID WC  . insulin aspart  0-5 Units Subcutaneous QHS  . insulin glargine  15 Units Subcutaneous Daily  . levothyroxine  50 mcg Oral Daily  . linagliptin  5 mg Oral Daily  . methylPREDNISolone (SOLU-MEDROL) injection  40 mg Intravenous Q12H  . midodrine  10 mg Oral TID WC  . pantoprazole  40 mg Oral Q1200  . polyethylene glycol  17 g Oral  Daily  . sodium bicarbonate  650 mg Oral TID   Continuous Infusions: . sodium chloride 10 mL/hr at 12/31/19 0236  . heparin 1,100 Units/hr (01/06/20 2129)   PRN Meds:.sodium chloride,  acetaminophen, albuterol, ALPRAZolam, chlorpheniramine-HYDROcodone, diltiazem, guaiFENesin-dextromethorphan, hydrALAZINE, lidocaine (PF), ondansetron **OR** ondansetron (ZOFRAN) IV, oxyCODONE, traZODone   Time Spent in minutes  35    See all Orders from today for further details   Oren Binet M.D on 01/07/2020 at 3:57 PM  To page go to [email or web address] - use universal password  [facility] -  Office  [phone]    Objective:   Vitals:   01/07/20 1430 01/07/20 1500 01/07/20 1515 01/07/20 1530  BP: 104/61 (!) 76/56 (!) 92/48 (!) 104/53  Pulse: (!) 56 76 76 79  Resp: (!) 21 17 (!) 21 (!) 21  Temp:      TempSrc:      SpO2: 97% 99% 99% 100%  Weight:      Height:        Wt Readings from Last 3 Encounters:  01/07/20 103.5 kg  09/06/19 89.5 kg  08/10/19 88.9 kg     Intake/Output Summary (Last 24 hours) at 01/07/2020 1557 Last data filed at 01/07/2020 0500 Gross per 24 hour  Intake 81.99 ml  Output 219 ml  Net -137.01 ml     Physical Exam Gen Exam:Alert awake-not in any distress.  Looks very frail and debilitated. HEENT:atraumatic, normocephalic Chest: B/L clear to auscultation anteriorly CVS:S1S2 regular Abdomen:soft non tender, non distended Extremities:+++ edema Neurology: Non focal Skin: no rash   Data Review:    CBC Recent Labs  Lab 01/02/20 0253 01/04/20 0358 01/05/20 0352 01/06/20 0726 01/07/20 0118  WBC 23.2* 32.3* 29.2* 23.5* 23.7*  HGB 13.3 13.2 11.4* 10.8* 9.6*  HCT 40.1 39.7 34.0* 32.6* 28.2*  PLT 304 165 124* PLATELET CLUMPS NOTED ON SMEAR, COUNT APPEARS DECREASED 84*  MCV 95.2 95.2 95.2 93.9 94.3  MCH 31.6 31.7 31.9 31.1 32.1  MCHC 33.2 33.2 33.5 33.1 34.0  RDW 15.8* 15.9* 15.8* 15.3 15.4    Chemistries  Recent Labs  Lab 01/03/20 0453 01/04/20 0358 01/05/20 0352 01/06/20 0726 01/07/20 0118  NA 141 143 139 136 135  K 5.0 4.6 4.8 5.5* 4.7  CL 100 104 102 99 98  CO2 26 24 24 23 26   GLUCOSE 151* 81 105* 106* 165*  BUN  117* 127* 157* 168* 95*  CREATININE 2.63* 3.14* 3.80* 4.23* 3.19*  CALCIUM 9.4 8.7* 8.1* 8.0* 7.4*  AST  --   --   --  33 32  ALT  --   --   --  28 26  ALKPHOS  --   --   --  54 53  BILITOT  --   --   --  1.2 1.1   ------------------------------------------------------------------------------------------------------------------ No results for input(s): CHOL, HDL, LDLCALC, TRIG, CHOLHDL, LDLDIRECT in the last 72 hours.  Lab Results  Component Value Date   HGBA1C 10.4 (H) 12/25/2019   ------------------------------------------------------------------------------------------------------------------ No results for input(s): TSH, T4TOTAL, T3FREE, THYROIDAB in the last 72 hours.  Invalid input(s): FREET3 ------------------------------------------------------------------------------------------------------------------ No results for input(s): VITAMINB12, FOLATE, FERRITIN, TIBC, IRON, RETICCTPCT in the last 72 hours.  Coagulation profile Recent Labs  Lab 01/06/20 1538  INR 1.5*    Recent Labs    01/06/20 0726 01/07/20 0118  DDIMER 5.89* 5.01*    Cardiac Enzymes No results for input(s): CKMB, TROPONINI, MYOGLOBIN in the last 168 hours.  Invalid input(s): CK ------------------------------------------------------------------------------------------------------------------    Component  Value Date/Time   BNP 211.4 (H) 12/26/2019 0448    Micro Results No results found for this or any previous visit (from the past 240 hour(s)).  Radiology Reports CT ABDOMEN PELVIS WO CONTRAST  Result Date: 01/03/2020 CLINICAL DATA:  Respiratory failure with COVID-19 and worsening oxygenation. Nausea vomiting and abdominal pain. EXAM: CT CHEST, ABDOMEN AND PELVIS WITHOUT CONTRAST TECHNIQUE: Multidetector CT imaging of the chest, abdomen and pelvis was performed following the standard protocol without IV contrast. COMPARISON:  Abdominal radiograph earlier today. Chest radiograph 01/01/2020.  FINDINGS: CT CHEST FINDINGS Cardiovascular: Left-sided pacemaker in place. Mild cardiomegaly. No pericardial effusion. Thoracic aorta is normal in caliber with mild to moderate atherosclerosis. Mediastinum/Nodes: Small mediastinal lymph nodes some of which are calcified, not enlarged by size criteria. No bulky hilar adenopathy. Tiny hiatal hernia. No esophageal wall thickening. No suspicious thyroid nodule. Lungs/Pleura: Bilateral geographic ground-glass opacities within both lungs. Superimposed consolidations in the anterior right upper and right middle lobes. There are small bilateral pleural effusions. Calcified granuloma in the right lower lobe. There is retained mucus within the dependent trachea extending into the right mainstem bronchus. Musculoskeletal: Multilevel degenerative change in the thoracic spine. There are no acute or suspicious osseous abnormalities. CT ABDOMEN PELVIS FINDINGS Hepatobiliary: No evidence of focal liver abnormality on noncontrast exam. Possible high-density contents in the gallbladder which may represent sludge. There is a small amount of perihepatic ascites. Small peripheral calcifications consistent with prior granulomatous disease. Pancreas: No ductal dilatation or inflammation. Spleen: No focal splenic abnormality. Small amount of perisplenic ascites. Occasional calcified granuloma. Adrenals/Urinary Tract: Mild bilateral adrenal thickening without dominant nodule. Bilateral renal parenchymal thinning. No hydronephrosis. Mild symmetric perinephric edema. Small amount of perinephric fluid adjacent to the inferior right kidney. No evidence of focal lesion or stone. Urinary bladder is unremarkable. Stomach/Bowel: Small hiatal hernia. The stomach is unremarkable. No small bowel obstruction or inflammation. Cecum slightly high-riding in the right mid abdomen. Appendix is not definitively visualized. Mixed liquid and solid stool throughout the colon. There is no colonic wall thickening  or inflammation. Sigmoid diverticulosis without diverticulitis. Vascular/Lymphatic: Aortic atherosclerosis. No aortic aneurysm. No portal or mesenteric venous gas. There is no bulky abdominopelvic adenopathy. Reproductive: Uterus unremarkable. No adnexal mass. Ovaries not well-defined. Other: Small volume abdominopelvic ascites with free fluid in the right greater than left upper quadrant, pericolic gutters, and in the pelvis. There is generalized subcutaneous edema of the body wall. Scarring and postsurgical change of the midline anterior abdominal wall. No free air or abscess. Musculoskeletal: Degenerative change of the hips and spine. There are no acute or suspicious osseous abnormalities. IMPRESSION: 1. Bilateral geographic ground-glass opacities within both lungs with superimposed consolidations in the anterior right upper and right middle lobes, consistent with pneumonia, pattern typical of COVID-19. Parenchymal involvement is moderate. 2. Small bilateral pleural effusions. 3. Retained mucus within the dependent trachea extending into the right mainstem bronchus. 4. Small volume abdominopelvic ascites. Generalized body wall edema. Findings suggest third-spacing. 5. Sigmoid diverticulosis without diverticulitis. 6. Possible high-density contents in the gallbladder which may represent sludge. 7. Liquid and solid stool throughout the colon which can be seen in the setting of diarrhea. No bowel wall thickening. 8. Additional chronic findings as described. Aortic Atherosclerosis (ICD10-I70.0). Electronically Signed   By: Keith Rake M.D.   On: 01/03/2020 19:07   CT CHEST WO CONTRAST  Result Date: 01/03/2020 CLINICAL DATA:  Respiratory failure with COVID-19 and worsening oxygenation. Nausea vomiting and abdominal pain. EXAM: CT CHEST, ABDOMEN AND PELVIS WITHOUT  CONTRAST TECHNIQUE: Multidetector CT imaging of the chest, abdomen and pelvis was performed following the standard protocol without IV contrast.  COMPARISON:  Abdominal radiograph earlier today. Chest radiograph 01/01/2020. FINDINGS: CT CHEST FINDINGS Cardiovascular: Left-sided pacemaker in place. Mild cardiomegaly. No pericardial effusion. Thoracic aorta is normal in caliber with mild to moderate atherosclerosis. Mediastinum/Nodes: Small mediastinal lymph nodes some of which are calcified, not enlarged by size criteria. No bulky hilar adenopathy. Tiny hiatal hernia. No esophageal wall thickening. No suspicious thyroid nodule. Lungs/Pleura: Bilateral geographic ground-glass opacities within both lungs. Superimposed consolidations in the anterior right upper and right middle lobes. There are small bilateral pleural effusions. Calcified granuloma in the right lower lobe. There is retained mucus within the dependent trachea extending into the right mainstem bronchus. Musculoskeletal: Multilevel degenerative change in the thoracic spine. There are no acute or suspicious osseous abnormalities. CT ABDOMEN PELVIS FINDINGS Hepatobiliary: No evidence of focal liver abnormality on noncontrast exam. Possible high-density contents in the gallbladder which may represent sludge. There is a small amount of perihepatic ascites. Small peripheral calcifications consistent with prior granulomatous disease. Pancreas: No ductal dilatation or inflammation. Spleen: No focal splenic abnormality. Small amount of perisplenic ascites. Occasional calcified granuloma. Adrenals/Urinary Tract: Mild bilateral adrenal thickening without dominant nodule. Bilateral renal parenchymal thinning. No hydronephrosis. Mild symmetric perinephric edema. Small amount of perinephric fluid adjacent to the inferior right kidney. No evidence of focal lesion or stone. Urinary bladder is unremarkable. Stomach/Bowel: Small hiatal hernia. The stomach is unremarkable. No small bowel obstruction or inflammation. Cecum slightly high-riding in the right mid abdomen. Appendix is not definitively visualized. Mixed  liquid and solid stool throughout the colon. There is no colonic wall thickening or inflammation. Sigmoid diverticulosis without diverticulitis. Vascular/Lymphatic: Aortic atherosclerosis. No aortic aneurysm. No portal or mesenteric venous gas. There is no bulky abdominopelvic adenopathy. Reproductive: Uterus unremarkable. No adnexal mass. Ovaries not well-defined. Other: Small volume abdominopelvic ascites with free fluid in the right greater than left upper quadrant, pericolic gutters, and in the pelvis. There is generalized subcutaneous edema of the body wall. Scarring and postsurgical change of the midline anterior abdominal wall. No free air or abscess. Musculoskeletal: Degenerative change of the hips and spine. There are no acute or suspicious osseous abnormalities. IMPRESSION: 1. Bilateral geographic ground-glass opacities within both lungs with superimposed consolidations in the anterior right upper and right middle lobes, consistent with pneumonia, pattern typical of COVID-19. Parenchymal involvement is moderate. 2. Small bilateral pleural effusions. 3. Retained mucus within the dependent trachea extending into the right mainstem bronchus. 4. Small volume abdominopelvic ascites. Generalized body wall edema. Findings suggest third-spacing. 5. Sigmoid diverticulosis without diverticulitis. 6. Possible high-density contents in the gallbladder which may represent sludge. 7. Liquid and solid stool throughout the colon which can be seen in the setting of diarrhea. No bowel wall thickening. 8. Additional chronic findings as described. Aortic Atherosclerosis (ICD10-I70.0). Electronically Signed   By: Keith Rake M.D.   On: 01/03/2020 19:07   US RENAL  Result Date: 12/26/2019 CLINICAL DATA:  Acute kidney injury, stage III chronic kidney disease, COVID-19, hypertension, diabetes mellitus EXAM: RENAL / URINARY TRACT ULTRASOUND COMPLETE COMPARISON:  Abdominal ultrasound 01/28/2017 FINDINGS: Right Kidney: Renal  measurements: 10.1 x 4.1 x 4.8 cm = volume: 104 mL. Mild cortical thinning. Upper normal cortical echogenicity. No mass or hydronephrosis. Left Kidney: Renal measurements: 8.3 x 4.3 x 4.8 cm = volume: 89 mL. Cortical thinning. Increased cortical echogenicity. No mass, hydronephrosis or shadowing calcification. Bladder: Appears normal for degree of bladder  distention. Other: N/A IMPRESSION: Cortical thinning and probable medical renal disease changes of both kidneys. No evidence of renal mass or hydronephrosis. Electronically Signed   By: Lavonia Dana M.D.   On: 12/26/2019 11:58   IR Fluoro Guide CV Line Right  Result Date: 01/06/2020 INDICATION: ACUTE RENAL FAILURE, RESPIRATORY FAILURE, COVID PNEUMONIA EXAM: ULTRASOUND GUIDANCE FOR VASCULAR ACCESS RIGHT IJ TEMPORARY DIALYSIS CATHETER MEDICATIONS: 1% LIDOCAINE LOCAL ANESTHESIA/SEDATION: Moderate Sedation Time: NONE. The patient's level of consciousness and vital signs were monitored continuously by radiology nursing throughout the procedure under my direct supervision. FLUOROSCOPY TIME:  Fluoroscopy Time: 1 minutes 49 seconds (14 mGy). COMPLICATIONS: None immediate. PROCEDURE: Informed written consent was obtained from the patient after a thorough discussion of the procedural risks, benefits and alternatives. All questions were addressed. Maximal Sterile Barrier Technique was utilized including caps, mask, sterile gowns, sterile gloves, sterile drape, hand hygiene and skin antiseptic. A timeout was performed prior to the initiation of the procedure. Under sterile conditions and local anesthesia, ultrasound micropuncture needle access performed of the right internal jugular vein. Guidewire advanced centrally. Micro dilator set advanced. Amplatz guidewire advanced into the IVC. Tract dilatation performed to insert a 20 cm temporary dialysis catheter. Tip SVC RA junction. Images obtained for documentation. Catheter secured Prolene sutures. Blood aspirated easily  followed by saline and heparin flushes. Appropriate volume strength of heparin instilled in all lumens followed by external caps and a sterile dressing. No immediate complication. Patient tolerated the procedure well. IMPRESSION: Successful ultrasound fluoroscopic right IJ temporary dialysis catheter. Access ready for use. Electronically Signed   By: Jerilynn Mages.  Shick M.D.   On: 01/06/2020 09:01   IR US Guide Vasc Access Right  Result Date: 01/06/2020 INDICATION: ACUTE RENAL FAILURE, RESPIRATORY FAILURE, COVID PNEUMONIA EXAM: ULTRASOUND GUIDANCE FOR VASCULAR ACCESS RIGHT IJ TEMPORARY DIALYSIS CATHETER MEDICATIONS: 1% LIDOCAINE LOCAL ANESTHESIA/SEDATION: Moderate Sedation Time: NONE. The patient's level of consciousness and vital signs were monitored continuously by radiology nursing throughout the procedure under my direct supervision. FLUOROSCOPY TIME:  Fluoroscopy Time: 1 minutes 49 seconds (14 mGy). COMPLICATIONS: None immediate. PROCEDURE: Informed written consent was obtained from the patient after a thorough discussion of the procedural risks, benefits and alternatives. All questions were addressed. Maximal Sterile Barrier Technique was utilized including caps, mask, sterile gowns, sterile gloves, sterile drape, hand hygiene and skin antiseptic. A timeout was performed prior to the initiation of the procedure. Under sterile conditions and local anesthesia, ultrasound micropuncture needle access performed of the right internal jugular vein. Guidewire advanced centrally. Micro dilator set advanced. Amplatz guidewire advanced into the IVC. Tract dilatation performed to insert a 20 cm temporary dialysis catheter. Tip SVC RA junction. Images obtained for documentation. Catheter secured Prolene sutures. Blood aspirated easily followed by saline and heparin flushes. Appropriate volume strength of heparin instilled in all lumens followed by external caps and a sterile dressing. No immediate complication. Patient tolerated  the procedure well. IMPRESSION: Successful ultrasound fluoroscopic right IJ temporary dialysis catheter. Access ready for use. Electronically Signed   By: Jerilynn Mages.  Shick M.D.   On: 01/06/2020 09:01   DG Chest Port 1 View  Result Date: 01/01/2020 CLINICAL DATA:  COVID-19 positive, hypoxia EXAM: PORTABLE CHEST 1 VIEW COMPARISON:  12/30/2019 FINDINGS: Single frontal view of the chest demonstrates stable dual lead pacer. Cardiac silhouette is unremarkable. Multifocal bilateral airspace disease is unchanged. No effusion or pneumothorax. IMPRESSION: 1. Stable bilateral multifocal pneumonia. Electronically Signed   By: Randa Ngo M.D.   On: 01/01/2020 16:09   DG  Chest Port 1 View  Result Date: 12/30/2019 CLINICAL DATA:  Pneumonia.  COVID. EXAM: PORTABLE CHEST 1 VIEW COMPARISON:  12/28/2019. FINDINGS: Cardiac pacer with lead tip over the right atrium right ventricle. Cardiomegaly. No pulmonary venous congestion. Bilateral pulmonary infiltrates, right side greater than left again noted without interim change. No pleural effusion or pneumothorax. IMPRESSION: 1. Cardiac pacer with lead tips over the right atrium right ventricle. Cardiomegaly. No pulmonary venous congestion. 2. Bilateral pulmonary infiltrates, right side greater than left again noted without interim change. Electronically Signed   By: Marcello Moores  Register   On: 12/30/2019 06:17   DG CHEST PORT 1 VIEW  Result Date: 12/28/2019 CLINICAL DATA:  Hypoxia EXAM: PORTABLE CHEST 1 VIEW COMPARISON:  December 22, 2019 FINDINGS: There is persistent airspace opacity in the right mid and lower lung regions as well as in the left base region. No new opacity evident. Heart is mildly enlarged with pacemaker leads attached to the right atrium and right ventricle. No appreciable adenopathy. No bone lesions. No pneumothorax. IMPRESSION: Airspace opacity bilaterally, more on the right than on the left, essentially stable. Stable cardiac prominence with pacemaker leads present  in the right atrium and right ventricle regions. Electronically Signed   By: Lowella Grip III M.D.   On: 12/28/2019 08:13   DG Chest Port 1 View  Result Date: 12/18/2019 CLINICAL DATA:  COVID, hypoxic EXAM: PORTABLE CHEST 1 VIEW COMPARISON:  05/16/2016 FINDINGS: Left pacer remains in place, unchanged. Cardiomegaly. Patchy airspace opacities throughout the right lung and in the left base paddle with pneumonia. No effusions. No acute bony abnormality. IMPRESSION: Patchy bilateral airspace opacities, right greater than left compatible with pneumonia, likely COVID pneumonia. Electronically Signed   By: Rolm Baptise M.D.   On: 12/21/2019 21:51   DG Chest Port 1V same Day  Result Date: 01/06/2020 CLINICAL DATA:  Shortness of breath, COVID positive EXAM: PORTABLE CHEST 1 VIEW COMPARISON:  01/01/2020 FINDINGS: Interval placement of large bore right neck multi lumen vascular catheter, tip positioned near the superior cavoatrial junction. Interval increase in diffuse bilateral interstitial heterogeneous airspace opacity. Cardiomegaly with left chest multi lead pacer. IMPRESSION: 1. Interval placement of large bore right neck multi lumen vascular catheter, tip positioned near the superior cavoatrial junction. 2. Interval increase in diffuse bilateral interstitial heterogeneous airspace opacity, consistent with worsened COVID airspace disease. Electronically Signed   By: Eddie Candle M.D.   On: 01/06/2020 08:13   DG Abd Portable 2V  Result Date: 01/03/2020 CLINICAL DATA:  Nausea vomiting, weakness. EXAM: PORTABLE ABDOMEN - 2 VIEW COMPARISON:  01/01/2020 FINDINGS: There is a left chest wall pacer device with lead in the right atrial appendage and right ventricle. The bowel gas pattern appears nonobstructive. No dilated small bowel loops. Gas and stool noted within the colon. Degenerative disc disease identified within the lumbar spine. Mild bilateral hip osteoarthritis also noted. IMPRESSION: Nonobstructive  bowel gas pattern. Electronically Signed   By: Kerby Moors M.D.   On: 01/03/2020 09:55   ECHOCARDIOGRAM COMPLETE  Result Date: 12/25/2019    ECHOCARDIOGRAM REPORT   Patient Name:   Kathryn Richardson Armetta Date of Exam: 12/25/2019 Medical Rec #:  [id number]      Height:       63.0 in Accession #:    [id number]     Weight:       197.4 lb Date of Birth:  1935/01/02      BSA:          1.923 m Patient Age:  85 years       BP:           108/58 mmHg Patient Gender: F              HR:           70 bpm. Exam Location:  Inpatient Procedure: 2D Echo and Strain Analysis Indications:    Elevated brain natriuretic peptide (BNP) level [[id number]]  History:        Patient has prior history of Echocardiogram examinations, most                 recent 04/22/2016. Pacemaker, Arrythmias:Atrial Fibrillation;                 Risk Factors:Hypertension, Diabetes and Dyslipidemia. Chronic                 renal disease stage IV, Covid Pneumonia, Acute hypoxic                 respiratory failure.  Sonographer:    Darlina Sicilian RDCS Referring Phys: [phone] A CALDWELL POWELL [place]  1. Left ventricular ejection fraction, by estimation, is 50 to 55%. The left ventricle has low normal function. The left ventricle has no regional wall motion abnormalities. There is mild concentric left ventricular hypertrophy. Left ventricular diastolic function could not be evaluated. The average left ventricular global longitudinal strain is -7.1 %.  2. Right ventricular systolic function is mildly reduced. The right ventricular size is mildly enlarged. There is normal pulmonary artery systolic pressure. The estimated right ventricular systolic pressure is 81.1 mmHg.  3. Left atrial size was mildly dilated.  4. The mitral valve is degenerative. Mild mitral valve regurgitation. No evidence of mitral stenosis.  5. The aortic valve is tricuspid. Aortic valve regurgitation is not visualized. No aortic stenosis is present.  6. The inferior vena cava is normal in  size with greater than 50% respiratory variability, suggesting right atrial pressure of 3 mmHg. Comparison(s): Prior images unable to be directly viewed, comparison made by report only. No significant change from prior study. FINDINGS  Left Ventricle: Left ventricular ejection fraction, by estimation, is 50 to 55%. The left ventricle has low normal function. The left ventricle has no regional wall motion abnormalities. The average left ventricular global longitudinal strain is -7.1 %.  The left ventricular internal cavity size was normal in size. There is mild concentric left ventricular hypertrophy. Left ventricular diastolic function could not be evaluated due to atrial fibrillation. Left ventricular diastolic function could not be evaluated. Right Ventricle: The right ventricular size is mildly enlarged. No increase in right ventricular wall thickness. Right ventricular systolic function is mildly reduced. There is normal pulmonary artery systolic pressure. The tricuspid regurgitant velocity  is 2.27 m/s, and with an assumed right atrial pressure of 3 mmHg, the estimated right ventricular systolic pressure is 91.4 mmHg. Left Atrium: Left atrial size was mildly dilated. Right Atrium: Right atrial size was normal in size. Pericardium: Trivial pericardial effusion is present. Mitral Valve: The mitral valve is degenerative in appearance. Mild mitral annular calcification. Mild mitral valve regurgitation. No evidence of mitral valve stenosis. Tricuspid Valve: The tricuspid valve is grossly normal. Tricuspid valve regurgitation is mild . No evidence of tricuspid stenosis. Aortic Valve: The aortic valve is tricuspid. Aortic valve regurgitation is not visualized. No aortic stenosis is present. Pulmonic Valve: The pulmonic valve was grossly normal. Pulmonic valve regurgitation is mild. No evidence of pulmonic stenosis. Aorta: The aortic root and  ascending aorta are structurally normal, with no evidence of dilitation.  Venous: The inferior vena cava is normal in size with greater than 50% respiratory variability, suggesting right atrial pressure of 3 mmHg. IAS/Shunts: The atrial septum is grossly normal. Additional Comments: A pacer wire is visualized in the right atrium and right ventricle.   LV Volumes (MOD) LV vol d, MOD A2C: 88.6 ml LV vol d, MOD A4C: 85.2 ml 2D Longitudinal Strain LV vol s, MOD A2C: 47.3 ml 2D Strain GLS Avg:     -7.1 % LV vol s, MOD A4C: 44.7 ml LV SV MOD A2C:     41.3 ml LV SV MOD A4C:     85.2 ml LV SV MOD BP:      41.7 ml RIGHT ATRIUM           Index RA Area:     15.30 cm RA Volume:   38.00 ml  19.76 ml/m  TRICUSPID VALVE TR Peak grad:   20.6 mmHg TR Vmax:        227.00 cm/s Eleonore Chiquito MD Electronically signed by Eleonore Chiquito MD Signature Date/Time: 12/25/2019/10:32:44 AM    Final

## 2020-01-07 NOTE — Progress Notes (Signed)
Patient received back from dialysis axox4, vss, patient denied pain. Will continue to monitor

## 2020-01-07 NOTE — Progress Notes (Signed)
ANTICOAGULATION CONSULT NOTE - Follow Up Consult  Pharmacy Consult for heparin  Indication: atrial fibrillation  Allergies  Allergen Reactions  . Cortisone Other (See Comments)    Hyperglycemia   . Prednisone Other (See Comments)    Hyperglycemia     Patient Measurements: Height: 5\' 3"  (160 cm) Weight: 104 kg (229 lb 4.5 oz) IBW/kg (Calculated) : 52.4 Heparin Dosing Weight: 77kg  Vital Signs: Temp: 98.1 F (36.7 C) (08/28 1205) Temp Source: Oral (08/28 1205) BP: 108/45 (08/28 1205) Pulse Rate: 69 (08/28 1205)  Labs: Recent Labs    01/05/20 0352 01/05/20 0352 01/06/20 0726 01/06/20 1412 01/06/20 1538 01/06/20 1846 01/07/20 0118  HGB 11.4*   < > 10.8*  --   --   --  9.6*  HCT 34.0*  --  32.6*  --   --   --  28.2*  PLT 124*  --  PLATELET CLUMPS NOTED ON SMEAR, COUNT APPEARS DECREASED  --   --   --  84*  APTT  --   --   --  111*  --  29 78*  LABPROT  --   --   --   --  17.1*  --   --   INR  --   --   --   --  1.5*  --   --   HEPARINUNFRC  --   --   --  >2.20*  --  1.52* 1.68*  CREATININE 3.80*  --  4.23*  --   --   --  3.19*   < > = values in this interval not displayed.    Estimated Creatinine Clearance: 15.4 mL/min (A) (by C-G formula based on SCr of 3.19 mg/dL (H)).   Medications:  Scheduled:  . atorvastatin  80 mg Oral QHS  . benzonatate  100 mg Oral TID  . Chlorhexidine Gluconate Cloth  6 each Topical Daily  . diltiazem  60 mg Oral Q8H  . feeding supplement (ENSURE ENLIVE)  237 mL Oral TID BM  . insulin aspart  0-20 Units Subcutaneous TID WC  . insulin aspart  0-5 Units Subcutaneous QHS  . insulin glargine  15 Units Subcutaneous Daily  . levothyroxine  50 mcg Oral Daily  . linagliptin  5 mg Oral Daily  . methylPREDNISolone (SOLU-MEDROL) injection  40 mg Intravenous Q12H  . metoprolol tartrate  12.5 mg Oral BID  . midodrine  10 mg Oral BID WC  . pantoprazole  40 mg Oral Q1200  . polyethylene glycol  17 g Oral Daily  . sodium bicarbonate  650 mg Oral  TID   Infusions:  . sodium chloride 10 mL/hr at 12/31/19 0236  . heparin 1,100 Units/hr (01/06/20 2129)    Assessment: 85 y.o female with PMH of PAF on apixaban, admitted acute hypoxic respiratory failure due to COVID-19 pneumonia. Pharmacy consulted to dose heparin. Baseline HL 1.52 due to Apixaban PTA, last dose on 8/25 @ 0952.  Baseline aPTT 29. CBC is downtrending Hgb 9.6 with PLT 84.   APTT 78 (therapeutic) Heparin level still supratherapeutic due to recent apixaban dose  Per RN problems with infusion and no signs/symptoms of bleeding.   Goal of Therapy:  aPTT 66-85 seconds  Heparin level 0.3-0.5 (once effect of apixaban depleted) Monitor platelets by anticoagulation protocol: Yes   Plan:  Continue heparin infusion at 1100 units/hr Monitor daily HL and aPTT until clinically correlated, signs/sx of bleeding, and [place]  PGY1 Pharmacy Resident 01/07/2020,12:14 PM

## 2020-01-07 NOTE — Progress Notes (Signed)
Pt hypotensive throughout hd tx. Unable to consistently pull fluid due to frequent SBP in 70s-80s. Despite interventions such as lowered dialysate temp, albumin 50gm and midodrine 10 mg. Dr. Carolin Sicks made aware, no further orders noted. UF=1.6 liters net. Pt stable upon transfer.

## 2020-01-07 NOTE — Progress Notes (Addendum)
[facility] NEPHROLOGY PROGRESS NOTE  Assessment/ Plan: Pt is a 85 y.o. yo female with history of DM, HTN, HLD, CKD admitted with COVID-19 pneumonia, hypoxia, AKI, transferred from [facility] to [facility] for initiation of dialysis.  #Acute kidney injury with azotemia, oligoanuric: Baseline creatinine level 1.2-2.2 follows at [facility].  Underlying CKD due to DM and hypertension.  AKI likely ATN due to Covid infection and hemodynamic instability.  Transferred from [facility] long hospital and initiate dialysis on 8/27.  Nontunneled right IJ HD catheter placed by IR on 8/26.   Unable to UF yesterday because of hypotension. Plan for second HD today.  She has massive volume overload therefore attempt to UF.  BP is soft.  I will order IV albumin, midodrine and lowered dialysate temperature for intradialytic hypotension.  Discussed with nursing staff.  Hopefully she will tolerate dialysis. Patient is DNR/DNI.  Given her poor functional status and comorbidities I do not think she is a candidate for outpatient dialysis.  Discussed with the primary team.  #Acute hypoxic respiratory failure due to COVID-19 pneumonia: Treated by remdesivir, steroid and completed antibiotics.  Currently on supplemental oxygen.  Per primary team.  #Anasarca/hypoalbuminemia: Attempt to UF during HD today.  IV albumin.  #Hypotension/A. fib: Heart rate is controlled.  Already on diltiazem.  Holding metoprolol.  Increase midodrine to 10 mg 3 times daily.  #Metabolic acidosis: Now managed with dialysis.  #Anemia: Hemoglobin trending down likely dilutional and acute illness.  Monitor hemoglobin.  #Deconditioning/debility: PT OT evaluation.  Primary team discussed with the family about goals of care.  Now DNR/DNI.  Subjective: Seen and examined in bedside.  Patient is lying on bed.  She is alert awake and following commands.  Some shortness of breath.  Denies chest pain. Objective Vital signs in last 24 hours: Vitals:    01/07/20 0427 01/07/20 0523 01/07/20 0730 01/07/20 1205  BP: (!) 116/49 (!) 107/92 (!) 112/56 (!) 108/45  Pulse: 63  68 69  Resp: 19  18 19   Temp: 97.7 F (36.5 C)  (!) 97.3 F (36.3 C) 98.1 F (36.7 C)  TempSrc: Oral  Axillary Oral  SpO2: 91%  97% 90%  Weight:      Height:       Weight change:   Intake/Output Summary (Last 24 hours) at 01/07/2020 1310 Last data filed at 01/07/2020 0500 Gross per 24 hour  Intake 81.99 ml  Output 219 ml  Net -137.01 ml       Labs: Basic Metabolic Panel: Recent Labs  Lab 01/05/20 0352 01/06/20 0726 01/07/20 0118  NA 139 136 135  K 4.8 5.5* 4.7  CL 102 99 98  CO2 24 23 26   GLUCOSE 105* 106* 165*  BUN 157* 168* 95*  CREATININE 3.80* 4.23* 3.19*  CALCIUM 8.1* 8.0* 7.4*   Liver Function Tests: Recent Labs  Lab 01/06/20 0726 01/07/20 0118  AST 33 32  ALT 28 26  ALKPHOS 54 53  BILITOT 1.2 1.1  PROT 5.1* 4.5*  ALBUMIN 2.2* 1.9*   No results for input(s): LIPASE, AMYLASE in the last 168 hours. No results for input(s): AMMONIA in the last 168 hours. CBC: Recent Labs  Lab 01/02/20 0253 01/02/20 0253 01/04/20 0358 01/04/20 0358 01/05/20 0352 01/06/20 0726 01/07/20 0118  WBC 23.2*   < > 32.3*   < > 29.2* 23.5* 23.7*  HGB 13.3   < > 13.2   < > 11.4* 10.8* 9.6*  HCT 40.1   < > 39.7   < >  34.0* 32.6* 28.2*  MCV 95.2  --  95.2  --  95.2 93.9 94.3  PLT 304   < > 165   < > 124* PLATELET CLUMPS NOTED ON SMEAR, COUNT APPEARS DECREASED 84*   < > = values in this interval not displayed.   Cardiac Enzymes: No results for input(s): CKTOTAL, CKMB, CKMBINDEX, TROPONINI in the last 168 hours. CBG: Recent Labs  Lab 01/06/20 1200 01/06/20 1844 01/06/20 2115 01/07/20 0728 01/07/20 1136  GLUCAP 173* 105* 158* 135* 137*    Iron Studies: No results for input(s): IRON, TIBC, TRANSFERRIN, FERRITIN in the last 72 hours. Studies/Results: IR Fluoro Guide CV Line Right  Result Date: 01/06/2020 INDICATION: ACUTE RENAL FAILURE,  RESPIRATORY FAILURE, COVID PNEUMONIA EXAM: ULTRASOUND GUIDANCE FOR VASCULAR ACCESS RIGHT IJ TEMPORARY DIALYSIS CATHETER MEDICATIONS: 1% LIDOCAINE LOCAL ANESTHESIA/SEDATION: Moderate Sedation Time: NONE. The patient's level of consciousness and vital signs were monitored continuously by radiology nursing throughout the procedure under my direct supervision. FLUOROSCOPY TIME:  Fluoroscopy Time: 1 minutes 49 seconds (14 mGy). COMPLICATIONS: None immediate. PROCEDURE: Informed written consent was obtained from the patient after a thorough discussion of the procedural risks, benefits and alternatives. All questions were addressed. Maximal Sterile Barrier Technique was utilized including caps, mask, sterile gowns, sterile gloves, sterile drape, hand hygiene and skin antiseptic. A timeout was performed prior to the initiation of the procedure. Under sterile conditions and local anesthesia, ultrasound micropuncture needle access performed of the right internal jugular vein. Guidewire advanced centrally. Micro dilator set advanced. Amplatz guidewire advanced into the IVC. Tract dilatation performed to insert a 20 cm temporary dialysis catheter. Tip SVC RA junction. Images obtained for documentation. Catheter secured Prolene sutures. Blood aspirated easily followed by saline and heparin flushes. Appropriate volume strength of heparin instilled in all lumens followed by external caps and a sterile dressing. No immediate complication. Patient tolerated the procedure well. IMPRESSION: Successful ultrasound fluoroscopic right IJ temporary dialysis catheter. Access ready for use. Electronically Signed   By: Jerilynn Mages.  Shick M.D.   On: 01/06/2020 09:01   IR US Guide Vasc Access Right  Result Date: 01/06/2020 INDICATION: ACUTE RENAL FAILURE, RESPIRATORY FAILURE, COVID PNEUMONIA EXAM: ULTRASOUND GUIDANCE FOR VASCULAR ACCESS RIGHT IJ TEMPORARY DIALYSIS CATHETER MEDICATIONS: 1% LIDOCAINE LOCAL ANESTHESIA/SEDATION: Moderate Sedation Time:  NONE. The patient's level of consciousness and vital signs were monitored continuously by radiology nursing throughout the procedure under my direct supervision. FLUOROSCOPY TIME:  Fluoroscopy Time: 1 minutes 49 seconds (14 mGy). COMPLICATIONS: None immediate. PROCEDURE: Informed written consent was obtained from the patient after a thorough discussion of the procedural risks, benefits and alternatives. All questions were addressed. Maximal Sterile Barrier Technique was utilized including caps, mask, sterile gowns, sterile gloves, sterile drape, hand hygiene and skin antiseptic. A timeout was performed prior to the initiation of the procedure. Under sterile conditions and local anesthesia, ultrasound micropuncture needle access performed of the right internal jugular vein. Guidewire advanced centrally. Micro dilator set advanced. Amplatz guidewire advanced into the IVC. Tract dilatation performed to insert a 20 cm temporary dialysis catheter. Tip SVC RA junction. Images obtained for documentation. Catheter secured Prolene sutures. Blood aspirated easily followed by saline and heparin flushes. Appropriate volume strength of heparin instilled in all lumens followed by external caps and a sterile dressing. No immediate complication. Patient tolerated the procedure well. IMPRESSION: Successful ultrasound fluoroscopic right IJ temporary dialysis catheter. Access ready for use. Electronically Signed   By: Jerilynn Mages.  Shick M.D.   On: 01/06/2020 09:01   DG Chest [facility]  1V same Day  Result Date: 01/06/2020 CLINICAL DATA:  Shortness of breath, COVID positive EXAM: PORTABLE CHEST 1 VIEW COMPARISON:  01/01/2020 FINDINGS: Interval placement of large bore right neck multi lumen vascular catheter, tip positioned near the superior cavoatrial junction. Interval increase in diffuse bilateral interstitial heterogeneous airspace opacity. Cardiomegaly with left chest multi lead pacer. IMPRESSION: 1. Interval placement of large bore right  neck multi lumen vascular catheter, tip positioned near the superior cavoatrial junction. 2. Interval increase in diffuse bilateral interstitial heterogeneous airspace opacity, consistent with worsened COVID airspace disease. Electronically Signed   By: Eddie Candle M.D.   On: 01/06/2020 08:13    Medications: Infusions: . albumin human    . sodium chloride 10 mL/hr at 12/31/19 0236  . heparin 1,100 Units/hr (01/06/20 2129)    Scheduled Medications: . midodrine      . atorvastatin  80 mg Oral QHS  . benzonatate  100 mg Oral TID  . Chlorhexidine Gluconate Cloth  6 each Topical Daily  . diltiazem  60 mg Oral Q8H  . feeding supplement (ENSURE ENLIVE)  237 mL Oral TID BM  . insulin aspart  0-20 Units Subcutaneous TID WC  . insulin aspart  0-5 Units Subcutaneous QHS  . insulin glargine  15 Units Subcutaneous Daily  . levothyroxine  50 mcg Oral Daily  . linagliptin  5 mg Oral Daily  . methylPREDNISolone (SOLU-MEDROL) injection  40 mg Intravenous Q12H  . metoprolol tartrate  12.5 mg Oral BID  . midodrine  10 mg Oral BID WC  . pantoprazole  40 mg Oral Q1200  . polyethylene glycol  17 g Oral Daily  . sodium bicarbonate  650 mg Oral TID    have reviewed scheduled and prn medications.  Physical Exam: General: Critically ill looking female lying on bed Heart:RRR, s1s2 nl, no rub Lungs: Bibasal rhonchi Abdomen:soft, Non-tender, non-distended Extremities: Anasarca Dialysis Access: Right IJ temporary HD catheter.  Eugene Isadore Prasad Brindley Madarang 01/07/2020,1:10 PM  LOS: 15 days  Pager: [phone]

## 2020-01-08 LAB — GLUCOSE, CAPILLARY
Glucose-Capillary: 133 mg/dL — ABNORMAL HIGH (ref 70–99)
Glucose-Capillary: 75 mg/dL (ref 70–99)

## 2020-01-08 LAB — COMPREHENSIVE METABOLIC PANEL
ALT: 25 U/L (ref 0–44)
AST: 31 U/L (ref 15–41)
Albumin: 2.1 g/dL — ABNORMAL LOW (ref 3.5–5.0)
Alkaline Phosphatase: 51 U/L (ref 38–126)
Anion gap: 15 (ref 5–15)
BUN: 69 mg/dL — ABNORMAL HIGH (ref 8–23)
CO2: 23 mmol/L (ref 22–32)
Calcium: 7.3 mg/dL — ABNORMAL LOW (ref 8.9–10.3)
Chloride: 97 mmol/L — ABNORMAL LOW (ref 98–111)
Creatinine, Ser: 3.06 mg/dL — ABNORMAL HIGH (ref 0.44–1.00)
GFR calc Af Amer: 16 mL/min — ABNORMAL LOW (ref >60)
GFR calc non Af Amer: 13 mL/min — ABNORMAL LOW (ref >60)
Glucose, Bld: 157 mg/dL — ABNORMAL HIGH (ref 70–99)
Potassium: 5.1 mmol/L (ref 3.5–5.1)
Sodium: 135 mmol/L (ref 135–145)
Total Bilirubin: 1.4 mg/dL — ABNORMAL HIGH (ref 0.3–1.2)
Total Protein: 4.4 g/dL — ABNORMAL LOW (ref 6.5–8.1)

## 2020-01-08 LAB — IRON AND TIBC
Iron: 56 ug/dL (ref 28–170)
Saturation Ratios: 40 % — ABNORMAL HIGH (ref 10.4–31.8)
TIBC: 141 ug/dL — ABNORMAL LOW (ref 250–450)
UIBC: 85 ug/dL

## 2020-01-08 LAB — CBC
HCT: 19.8 % — ABNORMAL LOW (ref 36.0–46.0)
Hemoglobin: 6.5 g/dL — CL (ref 12.0–15.0)
MCH: 31.9 pg (ref 26.0–34.0)
MCHC: 32.8 g/dL (ref 30.0–36.0)
MCV: 97.1 fL (ref 80.0–100.0)
Platelets: 73 10*3/uL — ABNORMAL LOW (ref 150–400)
RBC: 2.04 MIL/uL — ABNORMAL LOW (ref 3.87–5.11)
RDW: 15.2 % (ref 11.5–15.5)
WBC: 23 10*3/uL — ABNORMAL HIGH (ref 4.0–10.5)
nRBC: 0.1 % (ref 0.0–0.2)

## 2020-01-08 LAB — RETICULOCYTES
Immature Retic Fract: 25.5 % — ABNORMAL HIGH (ref 2.3–15.9)
RBC.: 2.07 MIL/uL — ABNORMAL LOW (ref 3.87–5.11)
Retic Count, Absolute: 41.8 10*3/uL (ref 19.0–186.0)
Retic Ct Pct: 2 % (ref 0.4–3.1)

## 2020-01-08 LAB — D-DIMER, QUANTITATIVE: D-Dimer, Quant: 3.71 ug/mL-FEU — ABNORMAL HIGH (ref 0.00–0.50)

## 2020-01-08 LAB — MAGNESIUM: Magnesium: 2.4 mg/dL (ref 1.7–2.4)

## 2020-01-08 LAB — PREPARE RBC (CROSSMATCH)

## 2020-01-08 LAB — C-REACTIVE PROTEIN: CRP: 3.9 mg/dL — ABNORMAL HIGH (ref <1.0)

## 2020-01-08 LAB — VITAMIN B12: Vitamin B-12: 575 pg/mL (ref 180–914)

## 2020-01-08 LAB — APTT: aPTT: 60 seconds — ABNORMAL HIGH (ref 24–36)

## 2020-01-08 LAB — FOLATE: Folate: 8.3 ng/mL (ref >5.9)

## 2020-01-08 LAB — HEPARIN LEVEL (UNFRACTIONATED): Heparin Unfractionated: 1.24 IU/mL — ABNORMAL HIGH (ref 0.30–0.70)

## 2020-01-08 LAB — ABO/RH: ABO/RH(D): B POS

## 2020-01-08 LAB — FERRITIN: Ferritin: 575 ng/mL — ABNORMAL HIGH (ref 11–307)

## 2020-01-08 MED ORDER — SODIUM CHLORIDE 0.9% IV SOLUTION: Freq: Once | INTRAVENOUS | Status: AC

## 2020-01-08 MED ORDER — LACTATED RINGERS IV BOLUS
500.0000 mL | Freq: Once | INTRAVENOUS | Status: AC
  Administered 2020-01-08: 500 mL via INTRAVENOUS

## 2020-01-08 MED ORDER — OXYMETAZOLINE HCL 0.05 % NA SOLN
2.0000 | Freq: Two times a day (BID) | NASAL | Status: DC | PRN
  Administered 2020-01-08: 2 via NASAL
  Filled 2020-01-08: qty 30

## 2020-01-08 MED ORDER — OXYMETAZOLINE HCL 0.05 % NA SOLN: 1.0000 | Freq: Two times a day (BID) | NASAL | Status: DC

## 2020-01-09 LAB — TYPE AND SCREEN
ABO/RH(D): B POS
Antibody Screen: NEGATIVE
Unit division: 0
Unit division: 0

## 2020-01-09 LAB — BPAM RBC
Blood Product Expiration Date: 202109022359
Blood Product Expiration Date: 202109192359
ISSUE DATE / TIME: 202108291129
Unit Type and Rh: 1700
Unit Type and Rh: 7300

## 2020-01-11 NOTE — Progress Notes (Addendum)
On call  Levonne Spiller., MD paged to notify of patients nose starting to bleed. Pt currently has Heparin gtt running at 1,000 units/hr. Pt is A/O x4. VSS. MD placed order for nasal afrin.

## 2020-01-11 NOTE — Progress Notes (Signed)
   01-11-20 1000  Assess: MEWS Score  Temp 97.6 F (36.4 C)  BP (!) 91/42  Pulse Rate 98  ECG Heart Rate 98  Resp 19  Level of Consciousness Alert  SpO2 96 %  O2 Device Non-rebreather Mask;HFNC  O2 Flow Rate (L/min) 15 L/min  Assess: MEWS Score  MEWS Temp 0  MEWS Systolic 1  MEWS Pulse 0  MEWS RR 0  MEWS LOC 0  MEWS Score 1  MEWS Score Color Green  Assess: if the MEWS score is Yellow or Red  Were vital signs taken at a resting state? Yes  Focused Assessment No change from prior assessment  Early Detection of Sepsis Score *See Row Information* Low  MEWS guidelines implemented *See Row Information* Yes  Treat  MEWS Interventions Administered scheduled meds/treatments  Pain Scale 0-10  Pain Score 0    Patient AXOX4, bolus received routine medication given for blood pressure support, patient stated she felt fatigued no s/s noted.

## 2020-01-11 NOTE — Progress Notes (Signed)
   02/01/20 1130  Assess: MEWS Score  Temp 97.8 F (36.6 C)  BP 137/62  Pulse Rate 99  Resp 11  SpO2 91 %  O2 Device HFNC;Non-rebreather Mask  Patient Activity (if Appropriate) In bed  O2 Flow Rate (L/min) 15 L/min  Assess: MEWS Score  MEWS Temp 0  MEWS Systolic 0  MEWS Pulse 0  MEWS RR 1  MEWS LOC 0  MEWS Score 1  MEWS Score Color Green  Blood transfusion started in piggy back on central line, at 47ml. Nurse in the room patient tolerating blood transfusion with no s/s of reaction.

## 2020-01-11 NOTE — Progress Notes (Signed)
   2020-01-18 0835  Assess: MEWS Score  Temp 97.6 F (36.4 C)  BP (!) 77/48  Pulse Rate 98  ECG Heart Rate 98  Resp (!) 43  Level of Consciousness Alert  SpO2 96 %  O2 Device Non-rebreather Mask  O2 Flow Rate (L/min) 15 L/min  Assess: MEWS Score  MEWS Temp 0  MEWS Systolic 2  MEWS Pulse 0  MEWS RR 3  MEWS LOC 0  MEWS Score 5  MEWS Score Color Red  Assess: if the MEWS score is Yellow or Red  Were vital signs taken at a resting state? Yes  Focused Assessment No change from prior assessment  Early Detection of Sepsis Score *See Row Information* Low  MEWS guidelines implemented *See Row Information* Yes  Treat  MEWS Interventions Administered scheduled meds/treatments  Pain Scale 0-10  Pain Score 0    Patient vitals recheck bp decreased notified md new order for bolus 500 ml and blood transfusion. Type and screen and consent obtained at bedside by nurse charge nurse and lab tech. Patient axox4 no s/s of distress.

## 2020-01-11 NOTE — Progress Notes (Signed)
   January 29, 2020 1016  Assess: MEWS Score  Temp 97.6 F (36.4 C)  BP (!) 131/105  Pulse Rate 91  ECG Heart Rate 84  Resp (!) 22  Level of Consciousness Alert  SpO2 96 %  O2 Device HFNC;Non-rebreather Mask  O2 Flow Rate (L/min) 15 L/min  Assess: MEWS Score  MEWS Temp 0  MEWS Systolic 0  MEWS Pulse 0  MEWS RR 1  MEWS LOC 0  MEWS Score 1  MEWS Score Color Green  Patient responded favorably to treatment vss, had to increase  Oxygen to combination of NRB/HFNC on 15 liters after desaturation during turning. Patient tolerated well no additional variances in vs.

## 2020-01-11 NOTE — Progress Notes (Signed)
PROGRESS NOTE                                                                                                                                                                                                             Patient Demographics:    Kathryn Richardson, is a 85 y.o. female, DOB - 07-02-1934, LYY:[id number]  Outpatient Primary MD for the patient is [name], MD   Admit date - 01/01/2020   LOS - 67  Chief Complaint  Patient presents with  . Covid positive       Brief Narrative: Patient is a 85 y.o. female with PMHx of CKD stage IV, DM-2, HTN, HLD, PAF on anticoagulation-diagnosed with COVID-19 on 8/5-presented to [facility] on 8/12 for worsening shortness of breath-found to have acute hypoxic respiratory failure due to COVID-19 pneumonia.  Admitted to the hospitalist service-and started on steroids/remdesivir and other supportive care.  Unfortunately further hospital course complicated by worsening hypoxemia requiring 15 L of HFNC and worsening AKI with anasarca.  Transferred to [facility] on 8/26-for initiation of HD. See below for further details  COVID-19 vaccinated status: Vaccinated  Significant Events: 8/5>> COVID-19 positive 8/12>> admit for hypoxia from COVID-19 pneumonia. 8/26>> transfer to [facility] for HD.  Significant studies: 8/12>>Chest x-ray: Patchy bilateral airspace opacities 8/15>> Echo: EF 12-75%, RV systolic function is mildly reduced 8/18>> chest x-ray: Airspace opacity bilaterally more on the right than on the left. 8/22>> chest x-ray: Stable bilateral multifocal pneumonia 8/24>> CT chest: Bilateral groundglass opacities within both lungs, retained mucus in the dependent trachea extending to the right mainstem bronchus, small bilateral pleural effusions. 8/24>> CT abdomen/pelvis: Small volume abdominal pelvic ascites, generalized body wall edema, sigmoid diverticulosis without diverticulitis, sludge in the  gallbladder 8/27>> chest x-ray: Increasing diffuse bilateral interstitial heterogeneous or space opacity  COVID-19 medications: Steroids: 8/12>> Remdesivir: 8/12>> 8/16 Casirivimab-imdevimab: 8/10 x 1  Antibiotics: Rocephin: 8/14>> 8/18 Zithromax: 8/14>> 8/18  Microbiology data: 8/12 >>blood culture: No growth  Procedures: 8/26>> right IJ temporary HD catheter by IR  Consults: None  DVT prophylaxis: SCDs Start: 12/23/19 2158    Subjective:   Patient in bed, appears comfortable, denies any headache, no fever, no chest pain or pressure, no shortness of breath , no abdominal pain. No focal weakness.    Assessment  & Plan :   Acute Hypoxic Resp Failure due  to breakthrough Covid 19 infection with pneumonia: Continues to have severe hypoxemia-remains on 15 L of HFNC.  Probably in the fibrotic stage of ARDS.  However could have some element of volume overload contributing to hypoxemia.  Remains on steroids-but will taper slightly-nephrology following-with plans for second round of HD today-unfortunately volume removal limited by hypotension..  Have had extensive discussion with patient/daughter-both aware that really no further role for any escalation in care beyond what we are doing.  DNR in place.  If patient deteriorates significantly apart from transitioning to comfort care-really no other options.  Fever: afebrile O2 requirements:  SpO2: 98 % O2 Flow Rate (L/min): 15 L/min FiO2 (%): 100 %   COVID-19 Labs: Recent Labs    01/06/20 0726 01/07/20 0118 09-Jan-2020 0656 09-Jan-2020 0824  DDIMER 5.89* 5.01* 3.71*  --   FERRITIN  --   --   --  575*  CRP 9.0* 5.3* 3.9*  --        Component Value Date/Time   BNP 211.4 (H) 12/26/2019 0448    No results for input(s): PROCALCITON in the last 168 hours.  Lab Results  Component Value Date   SARSCOV2NAA POSITIVE (A) 12/17/2019   [place] NEGATIVE 08/13/2019     Prone/Incentive Spirometry: encouragedincentive spirometry use  3-4/hour.  Elevated D-dimer: Probably secondary to COVID-19 infection-insertion of HD catheter.  Hypoxia has remained stable over the past few days.  Eliquis held due to mild hematochezia 2-3 days back while at [facility].  Have started IV heparin-no evidence of bleeding.  AKI on CKD stage IV: AKI hemodynamically mediated due to COVID-19 infection-remains massively volume overloaded with generalized anasarca.  HD started on 8/27-with plans to repeat HD on 8/28.  Unfortunately volume removal limited due to hypotension.  Scheduled Cardizem/beta-blocker discontinued-midodrine dosage adjusted-we will see if she is able to tolerate HD over the next few days.  Spoke with nephrology MD-Dr. Linton Ham is not a candidate for long-term HD (agree)-if no renal recovery-then apart from hospice care-really no other options.  I have updated family-daughter Deb on 8/28 regarding challenges with HD-and that patient is not a outpatient HD candidate.  Anasarca: Due to hypoalbuminemia and renal failure.  See above regarding issues with hypotension with HD.  Massive epistaxis with blood loss related acute anemia on 01-09-2020.  Getting 1 unit of packed RBC, holding heparin drip, nosebleed has resolved will monitor.    [place] metabolic acidosis: Improved-likely due to AKI.  Persistent atrial fibrillation: Rate controlled-will stop both Cardizem and metoprolol given hypotension with HD.  On IV heparin-Eliquis remains on hold.  Continue to monitor closely.  History of sick sinus syndrome: PPM in place-telemetry monitoring continues.  Transient hematochezia: Occurred 2-3 days back while at [facility]-but has since resolved.  Apparently occurred after laxatives were given for constipation.  IV heparin started on 8/27-no further hematochezia observed.  DM-2 (A1c 10.4 on 8/15) with uncontrolled hyperglycemia due to steroids: CBGs relatively stable-continue Lantus 15 units and SSI.  Follow and adjust.     Recent Labs     01/07/20 1704 01/07/20 2116 01-09-20 0807  GLUCAP 99 143* 133*   Hypothyroidism: Continue Synthroid  Anxiety: Relatively stable-secondary to acute illness-continue as needed Xanax  Constipation/diarrhea: Had constipation-given laxatives and then briefly had diarrhea.  Will maintain on a bowel regimen with MiraLAX-and titrate accordingly.  Debility/deconditioning: Secondary to acute illness-PT/OT following.    Palliative care discussion: Detailed discussion with patient's daughter if any further decline comfort measures.  DNR.  Her prognosis unfortunately is extremely  poor.  Morbid Obesity: Estimated body mass index is 39.68 kg/m as calculated from the following:   Height as of this encounter: 5\' 3"  (1.6 m).   Weight as of this encounter: 101.6 kg.   RN pressure injury documentation:    GI prophylaxis: PPI   Condition - Extremely Guarded-very tenuous with risk for further deterioration  Family Communication  :  Daughter-Deb [phone] -on 8/28, 8/29  Code Status :  DNR  Diet :  Diet Order            Diet heart healthy/carb modified Room service appropriate? Yes; Fluid consistency: Thin; Fluid restriction: 1200 mL Fluid  Diet effective now                  Disposition Plan  :    Status is: Inpatient  Remains inpatient appropriate because:Inpatient level of care appropriate due to severity of illness  Dispo:  Patient From: [place]  Planned Disposition: [place]  Expected discharge date: TBD  Medically stable for discharge: No    Barriers to discharge: Hypoxia requiring O2 supplementation-AKI requiring HD  Antimicorbials  :    Anti-infectives (From admission, onward)   Start     Dose/Rate Route Frequency Ordered Stop   12/24/19 1000  cefTRIAXone (ROCEPHIN) 2 g in sodium chloride 0.9 % 100 mL IVPB        2 g 200 mL/hr over 30 Minutes Intravenous Every 24 hours 12/24/19 0843 12/28/19 1001   12/24/19 1000  azithromycin  (ZITHROMAX) 500 mg in sodium chloride 0.9 % 250 mL IVPB        500 mg 250 mL/hr over 60 Minutes Intravenous Every 24 hours 12/24/19 0843 12/28/19 1104   12/23/19 1000  remdesivir 100 mg in sodium chloride 0.9 % 100 mL IVPB       "Followed by" Linked Group Details   100 mg 200 mL/hr over 30 Minutes Intravenous Daily 12/21/2019 2317 12/26/19 0842   12/23/19 0001  remdesivir 100 mg in sodium chloride 0.9 % 100 mL IVPB       "Followed by" Linked Group Details   100 mg 200 mL/hr over 30 Minutes Intravenous  Once 01/06/2020 2317 12/23/19 0224   12/26/2019 2330  remdesivir 100 mg in sodium chloride 0.9 % 100 mL IVPB       "Followed by" Linked Group Details   100 mg 200 mL/hr over 30 Minutes Intravenous  Once 01/07/2020 2317 12/23/19 0052      Inpatient Medications  Scheduled Meds: . sodium chloride   Intravenous Once  . atorvastatin  80 mg Oral QHS  . benzonatate  100 mg Oral TID  . Chlorhexidine Gluconate Cloth  6 each Topical Daily  . feeding supplement (ENSURE ENLIVE)  237 mL Oral TID BM  . insulin aspart  0-20 Units Subcutaneous TID WC  . insulin aspart  0-5 Units Subcutaneous QHS  . insulin glargine  15 Units Subcutaneous Daily  . levothyroxine  50 mcg Oral Daily  . linagliptin  5 mg Oral Daily  . methylPREDNISolone (SOLU-MEDROL) injection  40 mg Intravenous Q12H  . midodrine  10 mg Oral TID WC  . pantoprazole  40 mg Oral Q1200  . polyethylene glycol  17 g Oral Daily  . sodium bicarbonate  650 mg Oral TID   Continuous Infusions: . sodium chloride 10 mL/hr at 12/31/19 0236   PRN Meds:.sodium chloride, acetaminophen, albuterol, ALPRAZolam, chlorpheniramine-HYDROcodone, diltiazem, guaiFENesin-dextromethorphan, hydrALAZINE, lidocaine (PF), ondansetron **OR** ondansetron (ZOFRAN) IV, oxyCODONE,  oxymetazoline, traZODone   Time Spent in minutes  35    See all Orders from today for further details   Lala Lund M.D on January 13, 2020 at 10:22 AM  To page go to [email or web address] - use  universal password  [facility] -  Office  [phone]    Objective:   Vitals:   01-13-20 0010 Jan 13, 2020 0408 01/13/2020 0500 January 13, 2020 0800  BP: (!) 112/55 (!) 104/42  (!) 91/41  Pulse: 87 79  82  Resp: (!) 21 20  (!) 21  Temp: 98 F (36.7 C) 97.8 F (36.6 C)  97.6 F (36.4 C)  TempSrc: Axillary Axillary  Skin  SpO2: 90% 94%  98%  Weight:   101.6 kg   Height:        Wt Readings from Last 3 Encounters:  01-13-2020 101.6 kg  09/06/19 89.5 kg  08/10/19 88.9 kg     Intake/Output Summary (Last 24 hours) at Jan 13, 2020 1022 Last data filed at 01/07/2020 1635 Gross per 24 hour  Intake 0 ml  Output 1620 ml  Net -1620 ml     Physical Exam  Awake Alert, No new F.N deficits, Normal affect [place].AT,PERRAL Supple Neck,No JVD, No cervical lymphadenopathy appriciated.  Symmetrical Chest wall movement, Good air movement bilaterally, CTAB RRR,No Gallops, Rubs or new Murmurs, No Parasternal Heave +ve B.Sounds, Abd Soft, No tenderness, No organomegaly appriciated, No rebound - guarding or rigidity. No Cyanosis, Clubbing, 3+ edema    Data Review:    CBC Recent Labs  Lab 01/04/20 0358 01/05/20 0352 01/06/20 0726 01/07/20 0118 2020/01/13 0656  WBC 32.3* 29.2* 23.5* 23.7* 23.0*  HGB 13.2 11.4* 10.8* 9.6* 6.5*  HCT 39.7 34.0* 32.6* 28.2* 19.8*  PLT 165 124* PLATELET CLUMPS NOTED ON SMEAR, COUNT APPEARS DECREASED 84* 73*  MCV 95.2 95.2 93.9 94.3 97.1  MCH 31.7 31.9 31.1 32.1 31.9  MCHC 33.2 33.5 33.1 34.0 32.8  RDW 15.9* 15.8* 15.3 15.4 15.2    Chemistries  Recent Labs  Lab 01/04/20 0358 01/05/20 0352 01/06/20 0726 01/07/20 0118 01-13-20 0656 01-13-20 0812  NA 143 139 136 135 135  --   K 4.6 4.8 5.5* 4.7 5.1  --   CL 104 102 99 98 97*  --   CO2 24 24 23 26 23   --   GLUCOSE 81 105* 106* 165* 157*  --   BUN 127* 157* 168* 95* 69*  --   CREATININE 3.14* 3.80* 4.23* 3.19* 3.06*  --   CALCIUM 8.7* 8.1* 8.0* 7.4* 7.3*  --   MG  --   --   --   --   --  2.4  AST  --    --  33 32 31  --   ALT  --   --  28 26 25   --   ALKPHOS  --   --  54 53 51  --   BILITOT  --   --  1.2 1.1 1.4*  --    ------------------------------------------------------------------------------------------------------------------ No results for input(s): CHOL, HDL, LDLCALC, TRIG, CHOLHDL, LDLDIRECT in the last 72 hours.  Lab Results  Component Value Date   HGBA1C 10.4 (H) 12/25/2019   ------------------------------------------------------------------------------------------------------------------ No results for input(s): TSH, T4TOTAL, T3FREE, THYROIDAB in the last 72 hours.  Invalid input(s): FREET3 ------------------------------------------------------------------------------------------------------------------ Recent Labs    Jan 13, 2020 0723 01-13-2020 0824  VITAMINB12  --  575  FERRITIN  --  575*  TIBC  --  141*  IRON  --  56  RETICCTPCT 2.0  --  Coagulation profile Recent Labs  Lab 01/06/20 1538  INR 1.5*    Recent Labs    01/07/20 0118 01-21-20 0656  DDIMER 5.01* 3.71*    Cardiac Enzymes No results for input(s): CKMB, TROPONINI, MYOGLOBIN in the last 168 hours.  Invalid input(s): CK ------------------------------------------------------------------------------------------------------------------    Component Value Date/Time   BNP 211.4 (H) 12/26/2019 0448    Micro Results No results found for this or any previous visit (from the past 240 hour(s)).  Radiology Reports CT ABDOMEN PELVIS WO CONTRAST  Result Date: 01/03/2020 CLINICAL DATA:  Respiratory failure with COVID-19 and worsening oxygenation. Nausea vomiting and abdominal pain. EXAM: CT CHEST, ABDOMEN AND PELVIS WITHOUT CONTRAST TECHNIQUE: Multidetector CT imaging of the chest, abdomen and pelvis was performed following the standard protocol without IV contrast. COMPARISON:  Abdominal radiograph earlier today. Chest radiograph 01/01/2020. FINDINGS: CT CHEST FINDINGS Cardiovascular: Left-sided  pacemaker in place. Mild cardiomegaly. No pericardial effusion. Thoracic aorta is normal in caliber with mild to moderate atherosclerosis. Mediastinum/Nodes: Small mediastinal lymph nodes some of which are calcified, not enlarged by size criteria. No bulky hilar adenopathy. Tiny hiatal hernia. No esophageal wall thickening. No suspicious thyroid nodule. Lungs/Pleura: Bilateral geographic ground-glass opacities within both lungs. Superimposed consolidations in the anterior right upper and right middle lobes. There are small bilateral pleural effusions. Calcified granuloma in the right lower lobe. There is retained mucus within the dependent trachea extending into the right mainstem bronchus. Musculoskeletal: Multilevel degenerative change in the thoracic spine. There are no acute or suspicious osseous abnormalities. CT ABDOMEN PELVIS FINDINGS Hepatobiliary: No evidence of focal liver abnormality on noncontrast exam. Possible high-density contents in the gallbladder which may represent sludge. There is a small amount of perihepatic ascites. Small peripheral calcifications consistent with prior granulomatous disease. Pancreas: No ductal dilatation or inflammation. Spleen: No focal splenic abnormality. Small amount of perisplenic ascites. Occasional calcified granuloma. Adrenals/Urinary Tract: Mild bilateral adrenal thickening without dominant nodule. Bilateral renal parenchymal thinning. No hydronephrosis. Mild symmetric perinephric edema. Small amount of perinephric fluid adjacent to the inferior right kidney. No evidence of focal lesion or stone. Urinary bladder is unremarkable. Stomach/Bowel: Small hiatal hernia. The stomach is unremarkable. No small bowel obstruction or inflammation. Cecum slightly high-riding in the right mid abdomen. Appendix is not definitively visualized. Mixed liquid and solid stool throughout the colon. There is no colonic wall thickening or inflammation. Sigmoid diverticulosis without  diverticulitis. Vascular/Lymphatic: Aortic atherosclerosis. No aortic aneurysm. No portal or mesenteric venous gas. There is no bulky abdominopelvic adenopathy. Reproductive: Uterus unremarkable. No adnexal mass. Ovaries not well-defined. Other: Small volume abdominopelvic ascites with free fluid in the right greater than left upper quadrant, pericolic gutters, and in the pelvis. There is generalized subcutaneous edema of the body wall. Scarring and postsurgical change of the midline anterior abdominal wall. No free air or abscess. Musculoskeletal: Degenerative change of the hips and spine. There are no acute or suspicious osseous abnormalities. IMPRESSION: 1. Bilateral geographic ground-glass opacities within both lungs with superimposed consolidations in the anterior right upper and right middle lobes, consistent with pneumonia, pattern typical of COVID-19. Parenchymal involvement is moderate. 2. Small bilateral pleural effusions. 3. Retained mucus within the dependent trachea extending into the right mainstem bronchus. 4. Small volume abdominopelvic ascites. Generalized body wall edema. Findings suggest third-spacing. 5. Sigmoid diverticulosis without diverticulitis. 6. Possible high-density contents in the gallbladder which may represent sludge. 7. Liquid and solid stool throughout the colon which can be seen in the setting of diarrhea. No bowel wall thickening. 8. Additional  chronic findings as described. Aortic Atherosclerosis (ICD10-I70.0). Electronically Signed   By: Keith Rake M.D.   On: 01/03/2020 19:07   CT CHEST WO CONTRAST  Result Date: 01/03/2020 CLINICAL DATA:  Respiratory failure with COVID-19 and worsening oxygenation. Nausea vomiting and abdominal pain. EXAM: CT CHEST, ABDOMEN AND PELVIS WITHOUT CONTRAST TECHNIQUE: Multidetector CT imaging of the chest, abdomen and pelvis was performed following the standard protocol without IV contrast. COMPARISON:  Abdominal radiograph earlier today.  Chest radiograph 01/01/2020. FINDINGS: CT CHEST FINDINGS Cardiovascular: Left-sided pacemaker in place. Mild cardiomegaly. No pericardial effusion. Thoracic aorta is normal in caliber with mild to moderate atherosclerosis. Mediastinum/Nodes: Small mediastinal lymph nodes some of which are calcified, not enlarged by size criteria. No bulky hilar adenopathy. Tiny hiatal hernia. No esophageal wall thickening. No suspicious thyroid nodule. Lungs/Pleura: Bilateral geographic ground-glass opacities within both lungs. Superimposed consolidations in the anterior right upper and right middle lobes. There are small bilateral pleural effusions. Calcified granuloma in the right lower lobe. There is retained mucus within the dependent trachea extending into the right mainstem bronchus. Musculoskeletal: Multilevel degenerative change in the thoracic spine. There are no acute or suspicious osseous abnormalities. CT ABDOMEN PELVIS FINDINGS Hepatobiliary: No evidence of focal liver abnormality on noncontrast exam. Possible high-density contents in the gallbladder which may represent sludge. There is a small amount of perihepatic ascites. Small peripheral calcifications consistent with prior granulomatous disease. Pancreas: No ductal dilatation or inflammation. Spleen: No focal splenic abnormality. Small amount of perisplenic ascites. Occasional calcified granuloma. Adrenals/Urinary Tract: Mild bilateral adrenal thickening without dominant nodule. Bilateral renal parenchymal thinning. No hydronephrosis. Mild symmetric perinephric edema. Small amount of perinephric fluid adjacent to the inferior right kidney. No evidence of focal lesion or stone. Urinary bladder is unremarkable. Stomach/Bowel: Small hiatal hernia. The stomach is unremarkable. No small bowel obstruction or inflammation. Cecum slightly high-riding in the right mid abdomen. Appendix is not definitively visualized. Mixed liquid and solid stool throughout the colon. There  is no colonic wall thickening or inflammation. Sigmoid diverticulosis without diverticulitis. Vascular/Lymphatic: Aortic atherosclerosis. No aortic aneurysm. No portal or mesenteric venous gas. There is no bulky abdominopelvic adenopathy. Reproductive: Uterus unremarkable. No adnexal mass. Ovaries not well-defined. Other: Small volume abdominopelvic ascites with free fluid in the right greater than left upper quadrant, pericolic gutters, and in the pelvis. There is generalized subcutaneous edema of the body wall. Scarring and postsurgical change of the midline anterior abdominal wall. No free air or abscess. Musculoskeletal: Degenerative change of the hips and spine. There are no acute or suspicious osseous abnormalities. IMPRESSION: 1. Bilateral geographic ground-glass opacities within both lungs with superimposed consolidations in the anterior right upper and right middle lobes, consistent with pneumonia, pattern typical of COVID-19. Parenchymal involvement is moderate. 2. Small bilateral pleural effusions. 3. Retained mucus within the dependent trachea extending into the right mainstem bronchus. 4. Small volume abdominopelvic ascites. Generalized body wall edema. Findings suggest third-spacing. 5. Sigmoid diverticulosis without diverticulitis. 6. Possible high-density contents in the gallbladder which may represent sludge. 7. Liquid and solid stool throughout the colon which can be seen in the setting of diarrhea. No bowel wall thickening. 8. Additional chronic findings as described. Aortic Atherosclerosis (ICD10-I70.0). Electronically Signed   By: Keith Rake M.D.   On: 01/03/2020 19:07   US RENAL  Result Date: 12/26/2019 CLINICAL DATA:  Acute kidney injury, stage III chronic kidney disease, COVID-19, hypertension, diabetes mellitus EXAM: RENAL / URINARY TRACT ULTRASOUND COMPLETE COMPARISON:  Abdominal ultrasound 01/28/2017 FINDINGS: Right Kidney: Renal measurements: 10.1  x 4.1 x 4.8 cm = volume: 104  mL. Mild cortical thinning. Upper normal cortical echogenicity. No mass or hydronephrosis. Left Kidney: Renal measurements: 8.3 x 4.3 x 4.8 cm = volume: 89 mL. Cortical thinning. Increased cortical echogenicity. No mass, hydronephrosis or shadowing calcification. Bladder: Appears normal for degree of bladder distention. Other: N/A IMPRESSION: Cortical thinning and probable medical renal disease changes of both kidneys. No evidence of renal mass or hydronephrosis. Electronically Signed   By: Lavonia Dana M.D.   On: 12/26/2019 11:58   IR Fluoro Guide CV Line Right  Result Date: 01/06/2020 INDICATION: ACUTE RENAL FAILURE, RESPIRATORY FAILURE, COVID PNEUMONIA EXAM: ULTRASOUND GUIDANCE FOR VASCULAR ACCESS RIGHT IJ TEMPORARY DIALYSIS CATHETER MEDICATIONS: 1% LIDOCAINE LOCAL ANESTHESIA/SEDATION: Moderate Sedation Time: NONE. The patient's level of consciousness and vital signs were monitored continuously by radiology nursing throughout the procedure under my direct supervision. FLUOROSCOPY TIME:  Fluoroscopy Time: 1 minutes 49 seconds (14 mGy). COMPLICATIONS: None immediate. PROCEDURE: Informed written consent was obtained from the patient after a thorough discussion of the procedural risks, benefits and alternatives. All questions were addressed. Maximal Sterile Barrier Technique was utilized including caps, mask, sterile gowns, sterile gloves, sterile drape, hand hygiene and skin antiseptic. A timeout was performed prior to the initiation of the procedure. Under sterile conditions and local anesthesia, ultrasound micropuncture needle access performed of the right internal jugular vein. Guidewire advanced centrally. Micro dilator set advanced. Amplatz guidewire advanced into the IVC. Tract dilatation performed to insert a 20 cm temporary dialysis catheter. Tip SVC RA junction. Images obtained for documentation. Catheter secured Prolene sutures. Blood aspirated easily followed by saline and heparin flushes. Appropriate  volume strength of heparin instilled in all lumens followed by external caps and a sterile dressing. No immediate complication. Patient tolerated the procedure well. IMPRESSION: Successful ultrasound fluoroscopic right IJ temporary dialysis catheter. Access ready for use. Electronically Signed   By: Jerilynn Mages.  Shick M.D.   On: 01/06/2020 09:01   IR US Guide Vasc Access Right  Result Date: 01/06/2020 INDICATION: ACUTE RENAL FAILURE, RESPIRATORY FAILURE, COVID PNEUMONIA EXAM: ULTRASOUND GUIDANCE FOR VASCULAR ACCESS RIGHT IJ TEMPORARY DIALYSIS CATHETER MEDICATIONS: 1% LIDOCAINE LOCAL ANESTHESIA/SEDATION: Moderate Sedation Time: NONE. The patient's level of consciousness and vital signs were monitored continuously by radiology nursing throughout the procedure under my direct supervision. FLUOROSCOPY TIME:  Fluoroscopy Time: 1 minutes 49 seconds (14 mGy). COMPLICATIONS: None immediate. PROCEDURE: Informed written consent was obtained from the patient after a thorough discussion of the procedural risks, benefits and alternatives. All questions were addressed. Maximal Sterile Barrier Technique was utilized including caps, mask, sterile gowns, sterile gloves, sterile drape, hand hygiene and skin antiseptic. A timeout was performed prior to the initiation of the procedure. Under sterile conditions and local anesthesia, ultrasound micropuncture needle access performed of the right internal jugular vein. Guidewire advanced centrally. Micro dilator set advanced. Amplatz guidewire advanced into the IVC. Tract dilatation performed to insert a 20 cm temporary dialysis catheter. Tip SVC RA junction. Images obtained for documentation. Catheter secured Prolene sutures. Blood aspirated easily followed by saline and heparin flushes. Appropriate volume strength of heparin instilled in all lumens followed by external caps and a sterile dressing. No immediate complication. Patient tolerated the procedure well. IMPRESSION: Successful  ultrasound fluoroscopic right IJ temporary dialysis catheter. Access ready for use. Electronically Signed   By: Jerilynn Mages.  Shick M.D.   On: 01/06/2020 09:01   DG Chest Port 1 View  Result Date: 01/01/2020 CLINICAL DATA:  COVID-19 positive, hypoxia EXAM: PORTABLE CHEST 1  VIEW COMPARISON:  12/30/2019 FINDINGS: Single frontal view of the chest demonstrates stable dual lead pacer. Cardiac silhouette is unremarkable. Multifocal bilateral airspace disease is unchanged. No effusion or pneumothorax. IMPRESSION: 1. Stable bilateral multifocal pneumonia. Electronically Signed   By: Randa Ngo M.D.   On: 01/01/2020 16:09   DG Chest Port 1 View  Result Date: 12/30/2019 CLINICAL DATA:  Pneumonia.  COVID. EXAM: PORTABLE CHEST 1 VIEW COMPARISON:  12/28/2019. FINDINGS: Cardiac pacer with lead tip over the right atrium right ventricle. Cardiomegaly. No pulmonary venous congestion. Bilateral pulmonary infiltrates, right side greater than left again noted without interim change. No pleural effusion or pneumothorax. IMPRESSION: 1. Cardiac pacer with lead tips over the right atrium right ventricle. Cardiomegaly. No pulmonary venous congestion. 2. Bilateral pulmonary infiltrates, right side greater than left again noted without interim change. Electronically Signed   By: Marcello Moores  Register   On: 12/30/2019 06:17   DG CHEST PORT 1 VIEW  Result Date: 12/28/2019 CLINICAL DATA:  Hypoxia EXAM: PORTABLE CHEST 1 VIEW COMPARISON:  December 22, 2019 FINDINGS: There is persistent airspace opacity in the right mid and lower lung regions as well as in the left base region. No new opacity evident. Heart is mildly enlarged with pacemaker leads attached to the right atrium and right ventricle. No appreciable adenopathy. No bone lesions. No pneumothorax. IMPRESSION: Airspace opacity bilaterally, more on the right than on the left, essentially stable. Stable cardiac prominence with pacemaker leads present in the right atrium and right ventricle  regions. Electronically Signed   By: Lowella Grip III M.D.   On: 12/28/2019 08:13   DG Chest Port 1 View  Result Date: 12/30/2019 CLINICAL DATA:  COVID, hypoxic EXAM: PORTABLE CHEST 1 VIEW COMPARISON:  05/16/2016 FINDINGS: Left pacer remains in place, unchanged. Cardiomegaly. Patchy airspace opacities throughout the right lung and in the left base paddle with pneumonia. No effusions. No acute bony abnormality. IMPRESSION: Patchy bilateral airspace opacities, right greater than left compatible with pneumonia, likely COVID pneumonia. Electronically Signed   By: Rolm Baptise M.D.   On: 01/10/2020 21:51   DG Chest Port 1V same Day  Result Date: 01/06/2020 CLINICAL DATA:  Shortness of breath, COVID positive EXAM: PORTABLE CHEST 1 VIEW COMPARISON:  01/01/2020 FINDINGS: Interval placement of large bore right neck multi lumen vascular catheter, tip positioned near the superior cavoatrial junction. Interval increase in diffuse bilateral interstitial heterogeneous airspace opacity. Cardiomegaly with left chest multi lead pacer. IMPRESSION: 1. Interval placement of large bore right neck multi lumen vascular catheter, tip positioned near the superior cavoatrial junction. 2. Interval increase in diffuse bilateral interstitial heterogeneous airspace opacity, consistent with worsened COVID airspace disease. Electronically Signed   By: Eddie Candle M.D.   On: 01/06/2020 08:13   DG Abd Portable 2V  Result Date: 01/03/2020 CLINICAL DATA:  Nausea vomiting, weakness. EXAM: PORTABLE ABDOMEN - 2 VIEW COMPARISON:  01/01/2020 FINDINGS: There is a left chest wall pacer device with lead in the right atrial appendage and right ventricle. The bowel gas pattern appears nonobstructive. No dilated small bowel loops. Gas and stool noted within the colon. Degenerative disc disease identified within the lumbar spine. Mild bilateral hip osteoarthritis also noted. IMPRESSION: Nonobstructive bowel gas pattern. Electronically Signed    By: Kerby Moors M.D.   On: 01/03/2020 09:55   ECHOCARDIOGRAM COMPLETE  Result Date: 12/25/2019    ECHOCARDIOGRAM REPORT   Patient Name:   Kathryn Richardson Snare Date of Exam: 12/25/2019 Medical Rec #:  [id number]  Height:       63.0 in Accession #:    [id number]     Weight:       197.4 lb Date of Birth:  July 22, 1934      BSA:          1.923 m Patient Age:    25 years       BP:           108/58 mmHg Patient Gender: F              HR:           70 bpm. Exam Location:  Inpatient Procedure: 2D Echo and Strain Analysis Indications:    Elevated brain natriuretic peptide (BNP) level [094709]  History:        Patient has prior history of Echocardiogram examinations, most                 recent 04/22/2016. Pacemaker, Arrythmias:Atrial Fibrillation;                 Risk Factors:Hypertension, Diabetes and Dyslipidemia. Chronic                 renal disease stage IV, Covid Pneumonia, Acute hypoxic                 respiratory failure.  Sonographer:    Darlina Sicilian RDCS Referring Phys: [phone] A CALDWELL POWELL [place]  1. Left ventricular ejection fraction, by estimation, is 50 to 55%. The left ventricle has low normal function. The left ventricle has no regional wall motion abnormalities. There is mild concentric left ventricular hypertrophy. Left ventricular diastolic function could not be evaluated. The average left ventricular global longitudinal strain is -7.1 %.  2. Right ventricular systolic function is mildly reduced. The right ventricular size is mildly enlarged. There is normal pulmonary artery systolic pressure. The estimated right ventricular systolic pressure is 29.4 mmHg.  3. Left atrial size was mildly dilated.  4. The mitral valve is degenerative. Mild mitral valve regurgitation. No evidence of mitral stenosis.  5. The aortic valve is tricuspid. Aortic valve regurgitation is not visualized. No aortic stenosis is present.  6. The inferior vena cava is normal in size with greater than 50% respiratory  variability, suggesting right atrial pressure of 3 mmHg. Comparison(s): Prior images unable to be directly viewed, comparison made by report only. No significant change from prior study. FINDINGS  Left Ventricle: Left ventricular ejection fraction, by estimation, is 50 to 55%. The left ventricle has low normal function. The left ventricle has no regional wall motion abnormalities. The average left ventricular global longitudinal strain is -7.1 %.  The left ventricular internal cavity size was normal in size. There is mild concentric left ventricular hypertrophy. Left ventricular diastolic function could not be evaluated due to atrial fibrillation. Left ventricular diastolic function could not be evaluated. Right Ventricle: The right ventricular size is mildly enlarged. No increase in right ventricular wall thickness. Right ventricular systolic function is mildly reduced. There is normal pulmonary artery systolic pressure. The tricuspid regurgitant velocity  is 2.27 m/s, and with an assumed right atrial pressure of 3 mmHg, the estimated right ventricular systolic pressure is 76.5 mmHg. Left Atrium: Left atrial size was mildly dilated. Right Atrium: Right atrial size was normal in size. Pericardium: Trivial pericardial effusion is present. Mitral Valve: The mitral valve is degenerative in appearance. Mild mitral annular calcification. Mild mitral valve regurgitation. No evidence of mitral valve stenosis. Tricuspid Valve: The tricuspid valve  is grossly normal. Tricuspid valve regurgitation is mild . No evidence of tricuspid stenosis. Aortic Valve: The aortic valve is tricuspid. Aortic valve regurgitation is not visualized. No aortic stenosis is present. Pulmonic Valve: The pulmonic valve was grossly normal. Pulmonic valve regurgitation is mild. No evidence of pulmonic stenosis. Aorta: The aortic root and ascending aorta are structurally normal, with no evidence of dilitation. Venous: The inferior vena cava is normal in  size with greater than 50% respiratory variability, suggesting right atrial pressure of 3 mmHg. IAS/Shunts: The atrial septum is grossly normal. Additional Comments: A pacer wire is visualized in the right atrium and right ventricle.   LV Volumes (MOD) LV vol d, MOD A2C: 88.6 ml LV vol d, MOD A4C: 85.2 ml 2D Longitudinal Strain LV vol s, MOD A2C: 47.3 ml 2D Strain GLS Avg:     -7.1 % LV vol s, MOD A4C: 44.7 ml LV SV MOD A2C:     41.3 ml LV SV MOD A4C:     85.2 ml LV SV MOD BP:      41.7 ml RIGHT ATRIUM           Index RA Area:     15.30 cm RA Volume:   38.00 ml  19.76 ml/m  TRICUSPID VALVE TR Peak grad:   20.6 mmHg TR Vmax:        227.00 cm/s Eleonore Chiquito MD Electronically signed by Eleonore Chiquito MD Signature Date/Time: 12/25/2019/10:32:44 AM    Final

## 2020-01-11 NOTE — Progress Notes (Addendum)
Nurse rounding on patient during transfusion to remove telemetry and per transfer orders patient was now med surgical and pending comfort care.  patient blood pressure was no longer registering, and agonal breathing. Nurse checked pulse weak and thready, patient unresponse. Confirmed oxygen status at 15liters NRB/HFNC patient oxgen not reading on phillips monitor. Pulse checked again no pulse and patient no longer breathing. Death declared at 1214pm by nurse and charge nurse, notified MD.

## 2020-01-11 NOTE — Discharge Summary (Signed)
[facility] Death Note                                                                                                                                                                                               Kathryn Richardson, is a 85 y.o. female, DOB - [birth year], FUX:[id number]  Admit date - 01/04/2020   Admitting Physician A Melven Sartorius., MD  Outpatient Primary MD for the patient is [name], MD  LOS - 16  Chief Complaint  Patient presents with  . Covid positive       Notification: [name], MD notified of death of Feb 05, 2020   Date and Time of Death - Feb 05, 2020 at 12:14 PM  Pronounced by - RN  History of present illness:   Kathryn Richardson is a 85 y.o. female with a history of - 85 y.o. female with PMHx of CKD stage IV, DM-2, HTN, HLD, PAF on anticoagulation-diagnosed with COVID-19 on 8/5-presented to [facility] on 8/12 for worsening shortness of breath-found to have acute hypoxic respiratory failure due to COVID-19 pneumonia, despite appropriate treatment she continued to decline and developed ESRD.  She was being  transitioned to comfort measures and palliative care was going to see the patient for inpatient comfort measures however she died comfortably before she could be seen.   Final Diagnoses:  Cause if death - Covid 22 Pneumonia, AKI  Signature  Lala Lund M.D on February 05, 2020 at 12:22 PM  [facility]  Office Phone [phone]  Total clinical and documentation time for today Under 30 minutes   Last Note                                                                      PROGRESS NOTE  Patient Demographics:    Kathryn Richardson, is a 85 y.o. female, DOB - [birth year], [id number]  Outpatient Primary MD  for the patient is [name], MD   Admit date - 12/21/2019   LOS - 80  Chief Complaint  Patient presents with  . Covid positive       Brief Narrative: Patient is a 85 y.o. female with PMHx of CKD stage IV, DM-2, HTN, HLD, PAF on anticoagulation-diagnosed with COVID-19 on 8/5-presented to [facility] on 8/12 for worsening shortness of breath-found to have acute hypoxic respiratory failure due to COVID-19 pneumonia.  Admitted to the hospitalist service-and started on steroids/remdesivir and other supportive care.  Unfortunately further hospital course complicated by worsening hypoxemia requiring 15 L of HFNC and worsening AKI with anasarca.  Transferred to [facility] on 8/26-for initiation of HD. See below for further details  COVID-19 vaccinated status: Vaccinated  Significant Events: 8/5>> COVID-19 positive 8/12>> admit for hypoxia from COVID-19 pneumonia. 8/26>> transfer to [facility] for HD.  Significant studies: 8/12>>Chest x-ray: Patchy bilateral airspace opacities 8/15>> Echo: EF 58-09%, RV systolic function is mildly reduced 8/18>> chest x-ray: Airspace opacity bilaterally more on the right than on the left. 8/22>> chest x-ray: Stable bilateral multifocal pneumonia 8/24>> CT chest: Bilateral groundglass opacities within both lungs, retained mucus in the dependent trachea extending to the right mainstem bronchus, small bilateral pleural effusions. 8/24>> CT abdomen/pelvis: Small volume abdominal pelvic ascites, generalized body wall edema, sigmoid diverticulosis without diverticulitis, sludge in the gallbladder 8/27>> chest x-ray: Increasing diffuse bilateral interstitial heterogeneous or space opacity  COVID-19 medications: Steroids: 8/12>> Remdesivir: 8/12>> 8/16 Casirivimab-imdevimab: 8/10 x 1  Antibiotics: Rocephin: 8/14>> 8/18 Zithromax: 8/14>> 8/18  Microbiology data: 8/12 >>blood culture: No growth  Procedures: 8/26>> right IJ temporary HD catheter by IR  Consults: None  DVT  prophylaxis: SCDs Start: 12/23/19 2158    Subjective:   Patient in bed, appears comfortable, denies any headache, no fever, no chest pain or pressure, no shortness of breath , no abdominal pain. No focal weakness.    Assessment  & Plan :   Acute Hypoxic Resp Failure due to breakthrough Covid 19 infection with pneumonia: Continues to have severe hypoxemia-remains on 15 L of HFNC.  Probably in the fibrotic stage of ARDS.  However could have some element of volume overload contributing to hypoxemia.  Remains on steroids-but will taper slightly-nephrology following-with plans for second round of HD today-unfortunately volume removal limited by hypotension..  Have had extensive discussion with patient/daughter-both aware that really no further role for any escalation in care beyond what we are doing.  DNR in place.  If patient deteriorates significantly apart from transitioning to comfort care-really no other options.  Fever: afebrile O2 requirements:  SpO2: 91 % O2 Flow Rate (L/min): 15 L/min FiO2 (%): 100 %   COVID-19 Labs: Recent Labs    01/06/20 0726 01/07/20 0118 04-Feb-2020 0656 February 04, 2020 0824  DDIMER 5.89* 5.01* 3.71*  --   FERRITIN  --   --   --  575*  CRP 9.0* 5.3* 3.9*  --        Component Value Date/Time   BNP 211.4 (H) 12/26/2019 0448    No results for input(s): PROCALCITON in the last 168 hours.  Lab Results  Component Value Date   SARSCOV2NAA POSITIVE (A) 01/02/2020   [facility] NEGATIVE 08/13/2019     Prone/Incentive Spirometry: encouragedincentive spirometry use 3-4/hour.  Elevated D-dimer: Probably secondary to COVID-19 infection-insertion of HD catheter.  Hypoxia has remained stable over the past few  days.  Eliquis held due to mild hematochezia 2-3 days back while at [facility].  Have started IV heparin-no evidence of bleeding.  AKI on CKD stage IV: AKI hemodynamically mediated due to COVID-19 infection-remains massively volume overloaded with generalized anasarca.   HD started on 8/27-with plans to repeat HD on 8/28.  Unfortunately volume removal limited due to hypotension.  Scheduled Cardizem/beta-blocker discontinued-midodrine dosage adjusted-we will see if she is able to tolerate HD over the next few days.  Spoke with nephrology MD-Dr. Linton Ham is not a candidate for long-term HD (agree)-if no renal recovery-then apart from hospice care-really no other options.  I have updated family-daughter Deb on 8/28 regarding challenges with HD-and that patient is not a outpatient HD candidate.  Anasarca: Due to hypoalbuminemia and renal failure.  See above regarding issues with hypotension with HD.  Massive epistaxis with blood loss related acute anemia on 2020-01-31.  Getting 1 unit of packed RBC, holding heparin drip, nosebleed has resolved will monitor.    [place] metabolic acidosis: Improved-likely due to AKI.  Persistent atrial fibrillation: Rate controlled-will stop both Cardizem and metoprolol given hypotension with HD.  On IV heparin-Eliquis remains on hold.  Continue to monitor closely.  History of sick sinus syndrome: PPM in place-telemetry monitoring continues.  Transient hematochezia: Occurred 2-3 days back while at [facility]-but has since resolved.  Apparently occurred after laxatives were given for constipation.  IV heparin started on 8/27-no further hematochezia observed.  DM-2 (A1c 10.4 on 8/15) with uncontrolled hyperglycemia due to steroids: CBGs relatively stable-continue Lantus 15 units and SSI.  Follow and adjust.     Recent Labs    01/07/20 2116 2020-01-31 0807 Jan 31, 2020 1206  GLUCAP 143* 133* 75   Hypothyroidism: Continue Synthroid  Anxiety: Relatively stable-secondary to acute illness-continue as needed Xanax  Constipation/diarrhea: Had constipation-given laxatives and then briefly had diarrhea.  Will maintain on a bowel regimen with MiraLAX-and titrate accordingly.  Debility/deconditioning: Secondary to acute illness-PT/OT  following.    Palliative care discussion: Detailed discussion with patient's daughter if any further decline comfort measures.  DNR.  Her prognosis unfortunately is extremely poor.  Morbid Obesity: Estimated body mass index is 39.68 kg/m as calculated from the following:   Height as of this encounter: 5\' 3"  (1.6 m).   Weight as of this encounter: 101.6 kg.   RN pressure injury documentation:    GI prophylaxis: PPI   Condition - Extremely Guarded-very tenuous with risk for further deterioration  Family Communication  :  Daughter-Deb [phone] -on 8/28, 8/29  Code Status :  DNR  Diet :  Diet Order            Diet heart healthy/carb modified Room service appropriate? Yes; Fluid consistency: Thin; Fluid restriction: 1200 mL Fluid  Diet effective now                  Disposition Plan  :    Status is: Inpatient  Remains inpatient appropriate because:Inpatient level of care appropriate due to severity of illness  Dispo:  Patient From: [place]  Planned Disposition: [place]  Expected discharge date: TBD  Medically stable for discharge: No    Barriers to discharge: Hypoxia requiring O2 supplementation-AKI requiring HD  Antimicorbials  :    Anti-infectives (From admission, onward)   Start     Dose/Rate Route Frequency Ordered Stop   12/24/19 1000  cefTRIAXone (ROCEPHIN) 2 g in sodium chloride 0.9 % 100 mL IVPB  2 g 200 mL/hr over 30 Minutes Intravenous Every 24 hours 12/24/19 0843 12/28/19 1001   12/24/19 1000  azithromycin (ZITHROMAX) 500 mg in sodium chloride 0.9 % 250 mL IVPB        500 mg 250 mL/hr over 60 Minutes Intravenous Every 24 hours 12/24/19 0843 12/28/19 1104   12/23/19 1000  remdesivir 100 mg in sodium chloride 0.9 % 100 mL IVPB       "Followed by" Linked Group Details   100 mg 200 mL/hr over 30 Minutes Intravenous Daily 12/14/2019 2317 12/26/19 0842   12/23/19 0001  remdesivir 100 mg in sodium chloride 0.9 %  100 mL IVPB       "Followed by" Linked Group Details   100 mg 200 mL/hr over 30 Minutes Intravenous  Once 12/30/2019 2317 12/23/19 0224   12/13/2019 2330  remdesivir 100 mg in sodium chloride 0.9 % 100 mL IVPB       "Followed by" Linked Group Details   100 mg 200 mL/hr over 30 Minutes Intravenous  Once 12/26/2019 2317 12/23/19 0052      Inpatient Medications  Scheduled Meds: . atorvastatin  80 mg Oral QHS  . benzonatate  100 mg Oral TID  . Chlorhexidine Gluconate Cloth  6 each Topical Daily  . feeding supplement (ENSURE ENLIVE)  237 mL Oral TID BM  . insulin aspart  0-20 Units Subcutaneous TID WC  . insulin aspart  0-5 Units Subcutaneous QHS  . insulin glargine  15 Units Subcutaneous Daily  . levothyroxine  50 mcg Oral Daily  . linagliptin  5 mg Oral Daily  . methylPREDNISolone (SOLU-MEDROL) injection  40 mg Intravenous Q12H  . midodrine  10 mg Oral TID WC  . pantoprazole  40 mg Oral Q1200  . polyethylene glycol  17 g Oral Daily  . sodium bicarbonate  650 mg Oral TID   Continuous Infusions: . sodium chloride 10 mL/hr at 12/31/19 0236   PRN Meds:.sodium chloride, acetaminophen, albuterol, ALPRAZolam, chlorpheniramine-HYDROcodone, diltiazem, guaiFENesin-dextromethorphan, hydrALAZINE, lidocaine (PF), ondansetron **OR** ondansetron (ZOFRAN) IV, oxyCODONE, oxymetazoline, traZODone   Time Spent in minutes  35    See all Orders from today for further details   Lala Lund M.D on 01-22-20 at 12:23 PM  To page go to [email or web address] - use universal password  [facility] -  Office  [phone]    Objective:   Vitals:   January 22, 2020 0800 01/22/2020 1000 01-22-2020 1016 22-Jan-2020 1130  BP: (!) 91/41  (!) 131/105 137/62  Pulse: 82  91 99  Resp: (!) 21  (!) 22 11  Temp: 97.6 F (36.4 C) 97.6 F (36.4 C) 97.6 F (36.4 C) 97.8 F (36.6 C)  TempSrc: Skin Oral Oral Axillary  SpO2: 98%  96% 91%  Weight:      Height:        Wt Readings from Last 3 Encounters:  01-22-2020 101.6  kg  09/06/19 89.5 kg  08/10/19 88.9 kg     Intake/Output Summary (Last 24 hours) at 2020-01-22 1223 Last data filed at 01/22/20 0849 Gross per 24 hour  Intake 500 ml  Output 1620 ml  Net -1120 ml     Physical Exam  Awake Alert, No new F.N deficits, Normal affect [place].AT,PERRAL Supple Neck,No JVD, No cervical lymphadenopathy appriciated.  Symmetrical Chest wall movement, Good air movement bilaterally, CTAB RRR,No Gallops, Rubs or new Murmurs, No Parasternal Heave +ve B.Sounds, Abd Soft, No tenderness, No organomegaly appriciated, No rebound - guarding or rigidity. No Cyanosis, Clubbing, 3+ edema  Data Review:    CBC Recent Labs  Lab 01/04/20 0358 01/05/20 0352 01/06/20 0726 01/07/20 0118 2020/01/16 0656  WBC 32.3* 29.2* 23.5* 23.7* 23.0*  HGB 13.2 11.4* 10.8* 9.6* 6.5*  HCT 39.7 34.0* 32.6* 28.2* 19.8*  PLT 165 124* PLATELET CLUMPS NOTED ON SMEAR, COUNT APPEARS DECREASED 84* 73*  MCV 95.2 95.2 93.9 94.3 97.1  MCH 31.7 31.9 31.1 32.1 31.9  MCHC 33.2 33.5 33.1 34.0 32.8  RDW 15.9* 15.8* 15.3 15.4 15.2    Chemistries  Recent Labs  Lab 01/04/20 0358 01/05/20 0352 01/06/20 0726 01/07/20 0118 January 16, 2020 0656 16-Jan-2020 0812  NA 143 139 136 135 135  --   K 4.6 4.8 5.5* 4.7 5.1  --   CL 104 102 99 98 97*  --   CO2 24 24 23 26 23   --   GLUCOSE 81 105* 106* 165* 157*  --   BUN 127* 157* 168* 95* 69*  --   CREATININE 3.14* 3.80* 4.23* 3.19* 3.06*  --   CALCIUM 8.7* 8.1* 8.0* 7.4* 7.3*  --   MG  --   --   --   --   --  2.4  AST  --   --  33 32 31  --   ALT  --   --  28 26 25   --   ALKPHOS  --   --  54 53 51  --   BILITOT  --   --  1.2 1.1 1.4*  --    ------------------------------------------------------------------------------------------------------------------ No results for input(s): CHOL, HDL, LDLCALC, TRIG, CHOLHDL, LDLDIRECT in the last 72 hours.  Lab Results  Component Value Date   HGBA1C 10.4 (H) 12/25/2019    ------------------------------------------------------------------------------------------------------------------ No results for input(s): TSH, T4TOTAL, T3FREE, THYROIDAB in the last 72 hours.  Invalid input(s): FREET3 ------------------------------------------------------------------------------------------------------------------ Recent Labs    16-Jan-2020 0656 01/16/20 0723 January 16, 2020 0824  VITAMINB12  --   --  575  FOLATE 8.3  --   --   FERRITIN  --   --  575*  TIBC  --   --  141*  IRON  --   --  56  RETICCTPCT  --  2.0  --     Coagulation profile Recent Labs  Lab 01/06/20 1538  INR 1.5*    Recent Labs    01/07/20 0118 01-16-20 0656  DDIMER 5.01* 3.71*    Cardiac Enzymes No results for input(s): CKMB, TROPONINI, MYOGLOBIN in the last 168 hours.  Invalid input(s): CK ------------------------------------------------------------------------------------------------------------------    Component Value Date/Time   BNP 211.4 (H) 12/26/2019 0448    Micro Results No results found for this or any previous visit (from the past 240 hour(s)).  Radiology Reports CT ABDOMEN PELVIS WO CONTRAST  Result Date: 01/03/2020 CLINICAL DATA:  Respiratory failure with COVID-19 and worsening oxygenation. Nausea vomiting and abdominal pain. EXAM: CT CHEST, ABDOMEN AND PELVIS WITHOUT CONTRAST TECHNIQUE: Multidetector CT imaging of the chest, abdomen and pelvis was performed following the standard protocol without IV contrast. COMPARISON:  Abdominal radiograph earlier today. Chest radiograph 01/01/2020. FINDINGS: CT CHEST FINDINGS Cardiovascular: Left-sided pacemaker in place. Mild cardiomegaly. No pericardial effusion. Thoracic aorta is normal in caliber with mild to moderate atherosclerosis. Mediastinum/Nodes: Small mediastinal lymph nodes some of which are calcified, not enlarged by size criteria. No bulky hilar adenopathy. Tiny hiatal hernia. No esophageal wall thickening. No suspicious  thyroid nodule. Lungs/Pleura: Bilateral geographic ground-glass opacities within both lungs. Superimposed consolidations in the anterior right upper and right middle lobes. There are  small bilateral pleural effusions. Calcified granuloma in the right lower lobe. There is retained mucus within the dependent trachea extending into the right mainstem bronchus. Musculoskeletal: Multilevel degenerative change in the thoracic spine. There are no acute or suspicious osseous abnormalities. CT ABDOMEN PELVIS FINDINGS Hepatobiliary: No evidence of focal liver abnormality on noncontrast exam. Possible high-density contents in the gallbladder which may represent sludge. There is a small amount of perihepatic ascites. Small peripheral calcifications consistent with prior granulomatous disease. Pancreas: No ductal dilatation or inflammation. Spleen: No focal splenic abnormality. Small amount of perisplenic ascites. Occasional calcified granuloma. Adrenals/Urinary Tract: Mild bilateral adrenal thickening without dominant nodule. Bilateral renal parenchymal thinning. No hydronephrosis. Mild symmetric perinephric edema. Small amount of perinephric fluid adjacent to the inferior right kidney. No evidence of focal lesion or stone. Urinary bladder is unremarkable. Stomach/Bowel: Small hiatal hernia. The stomach is unremarkable. No small bowel obstruction or inflammation. Cecum slightly high-riding in the right mid abdomen. Appendix is not definitively visualized. Mixed liquid and solid stool throughout the colon. There is no colonic wall thickening or inflammation. Sigmoid diverticulosis without diverticulitis. Vascular/Lymphatic: Aortic atherosclerosis. No aortic aneurysm. No portal or mesenteric venous gas. There is no bulky abdominopelvic adenopathy. Reproductive: Uterus unremarkable. No adnexal mass. Ovaries not well-defined. Other: Small volume abdominopelvic ascites with free fluid in the right greater than left upper quadrant,  pericolic gutters, and in the pelvis. There is generalized subcutaneous edema of the body wall. Scarring and postsurgical change of the midline anterior abdominal wall. No free air or abscess. Musculoskeletal: Degenerative change of the hips and spine. There are no acute or suspicious osseous abnormalities. IMPRESSION: 1. Bilateral geographic ground-glass opacities within both lungs with superimposed consolidations in the anterior right upper and right middle lobes, consistent with pneumonia, pattern typical of COVID-19. Parenchymal involvement is moderate. 2. Small bilateral pleural effusions. 3. Retained mucus within the dependent trachea extending into the right mainstem bronchus. 4. Small volume abdominopelvic ascites. Generalized body wall edema. Findings suggest third-spacing. 5. Sigmoid diverticulosis without diverticulitis. 6. Possible high-density contents in the gallbladder which may represent sludge. 7. Liquid and solid stool throughout the colon which can be seen in the setting of diarrhea. No bowel wall thickening. 8. Additional chronic findings as described. Aortic Atherosclerosis (ICD10-I70.0). Electronically Signed   By: Keith Rake M.D.   On: 01/03/2020 19:07   CT CHEST WO CONTRAST  Result Date: 01/03/2020 CLINICAL DATA:  Respiratory failure with COVID-19 and worsening oxygenation. Nausea vomiting and abdominal pain. EXAM: CT CHEST, ABDOMEN AND PELVIS WITHOUT CONTRAST TECHNIQUE: Multidetector CT imaging of the chest, abdomen and pelvis was performed following the standard protocol without IV contrast. COMPARISON:  Abdominal radiograph earlier today. Chest radiograph 01/01/2020. FINDINGS: CT CHEST FINDINGS Cardiovascular: Left-sided pacemaker in place. Mild cardiomegaly. No pericardial effusion. Thoracic aorta is normal in caliber with mild to moderate atherosclerosis. Mediastinum/Nodes: Small mediastinal lymph nodes some of which are calcified, not enlarged by size criteria. No bulky hilar  adenopathy. Tiny hiatal hernia. No esophageal wall thickening. No suspicious thyroid nodule. Lungs/Pleura: Bilateral geographic ground-glass opacities within both lungs. Superimposed consolidations in the anterior right upper and right middle lobes. There are small bilateral pleural effusions. Calcified granuloma in the right lower lobe. There is retained mucus within the dependent trachea extending into the right mainstem bronchus. Musculoskeletal: Multilevel degenerative change in the thoracic spine. There are no acute or suspicious osseous abnormalities. CT ABDOMEN PELVIS FINDINGS Hepatobiliary: No evidence of focal liver abnormality on noncontrast exam. Possible high-density contents in the gallbladder which  may represent sludge. There is a small amount of perihepatic ascites. Small peripheral calcifications consistent with prior granulomatous disease. Pancreas: No ductal dilatation or inflammation. Spleen: No focal splenic abnormality. Small amount of perisplenic ascites. Occasional calcified granuloma. Adrenals/Urinary Tract: Mild bilateral adrenal thickening without dominant nodule. Bilateral renal parenchymal thinning. No hydronephrosis. Mild symmetric perinephric edema. Small amount of perinephric fluid adjacent to the inferior right kidney. No evidence of focal lesion or stone. Urinary bladder is unremarkable. Stomach/Bowel: Small hiatal hernia. The stomach is unremarkable. No small bowel obstruction or inflammation. Cecum slightly high-riding in the right mid abdomen. Appendix is not definitively visualized. Mixed liquid and solid stool throughout the colon. There is no colonic wall thickening or inflammation. Sigmoid diverticulosis without diverticulitis. Vascular/Lymphatic: Aortic atherosclerosis. No aortic aneurysm. No portal or mesenteric venous gas. There is no bulky abdominopelvic adenopathy. Reproductive: Uterus unremarkable. No adnexal mass. Ovaries not well-defined. Other: Small volume  abdominopelvic ascites with free fluid in the right greater than left upper quadrant, pericolic gutters, and in the pelvis. There is generalized subcutaneous edema of the body wall. Scarring and postsurgical change of the midline anterior abdominal wall. No free air or abscess. Musculoskeletal: Degenerative change of the hips and spine. There are no acute or suspicious osseous abnormalities. IMPRESSION: 1. Bilateral geographic ground-glass opacities within both lungs with superimposed consolidations in the anterior right upper and right middle lobes, consistent with pneumonia, pattern typical of COVID-19. Parenchymal involvement is moderate. 2. Small bilateral pleural effusions. 3. Retained mucus within the dependent trachea extending into the right mainstem bronchus. 4. Small volume abdominopelvic ascites. Generalized body wall edema. Findings suggest third-spacing. 5. Sigmoid diverticulosis without diverticulitis. 6. Possible high-density contents in the gallbladder which may represent sludge. 7. Liquid and solid stool throughout the colon which can be seen in the setting of diarrhea. No bowel wall thickening. 8. Additional chronic findings as described. Aortic Atherosclerosis (ICD10-I70.0). Electronically Signed   By: Keith Rake M.D.   On: 01/03/2020 19:07   US RENAL  Result Date: 12/26/2019 CLINICAL DATA:  Acute kidney injury, stage III chronic kidney disease, COVID-19, hypertension, diabetes mellitus EXAM: RENAL / URINARY TRACT ULTRASOUND COMPLETE COMPARISON:  Abdominal ultrasound 01/28/2017 FINDINGS: Right Kidney: Renal measurements: 10.1 x 4.1 x 4.8 cm = volume: 104 mL. Mild cortical thinning. Upper normal cortical echogenicity. No mass or hydronephrosis. Left Kidney: Renal measurements: 8.3 x 4.3 x 4.8 cm = volume: 89 mL. Cortical thinning. Increased cortical echogenicity. No mass, hydronephrosis or shadowing calcification. Bladder: Appears normal for degree of bladder distention. Other: N/A  IMPRESSION: Cortical thinning and probable medical renal disease changes of both kidneys. No evidence of renal mass or hydronephrosis. Electronically Signed   By: Lavonia Dana M.D.   On: 12/26/2019 11:58   IR Fluoro Guide CV Line Right  Result Date: 01/06/2020 INDICATION: ACUTE RENAL FAILURE, RESPIRATORY FAILURE, COVID PNEUMONIA EXAM: ULTRASOUND GUIDANCE FOR VASCULAR ACCESS RIGHT IJ TEMPORARY DIALYSIS CATHETER MEDICATIONS: 1% LIDOCAINE LOCAL ANESTHESIA/SEDATION: Moderate Sedation Time: NONE. The patient's level of consciousness and vital signs were monitored continuously by radiology nursing throughout the procedure under my direct supervision. FLUOROSCOPY TIME:  Fluoroscopy Time: 1 minutes 49 seconds (14 mGy). COMPLICATIONS: None immediate. PROCEDURE: Informed written consent was obtained from the patient after a thorough discussion of the procedural risks, benefits and alternatives. All questions were addressed. Maximal Sterile Barrier Technique was utilized including caps, mask, sterile gowns, sterile gloves, sterile drape, hand hygiene and skin antiseptic. A timeout was performed prior to the initiation of the procedure. Under sterile  conditions and local anesthesia, ultrasound micropuncture needle access performed of the right internal jugular vein. Guidewire advanced centrally. Micro dilator set advanced. Amplatz guidewire advanced into the IVC. Tract dilatation performed to insert a 20 cm temporary dialysis catheter. Tip SVC RA junction. Images obtained for documentation. Catheter secured Prolene sutures. Blood aspirated easily followed by saline and heparin flushes. Appropriate volume strength of heparin instilled in all lumens followed by external caps and a sterile dressing. No immediate complication. Patient tolerated the procedure well. IMPRESSION: Successful ultrasound fluoroscopic right IJ temporary dialysis catheter. Access ready for use. Electronically Signed   By: Jerilynn Mages.  Shick M.D.   On: 01/06/2020  09:01   IR US Guide Vasc Access Right  Result Date: 01/06/2020 INDICATION: ACUTE RENAL FAILURE, RESPIRATORY FAILURE, COVID PNEUMONIA EXAM: ULTRASOUND GUIDANCE FOR VASCULAR ACCESS RIGHT IJ TEMPORARY DIALYSIS CATHETER MEDICATIONS: 1% LIDOCAINE LOCAL ANESTHESIA/SEDATION: Moderate Sedation Time: NONE. The patient's level of consciousness and vital signs were monitored continuously by radiology nursing throughout the procedure under my direct supervision. FLUOROSCOPY TIME:  Fluoroscopy Time: 1 minutes 49 seconds (14 mGy). COMPLICATIONS: None immediate. PROCEDURE: Informed written consent was obtained from the patient after a thorough discussion of the procedural risks, benefits and alternatives. All questions were addressed. Maximal Sterile Barrier Technique was utilized including caps, mask, sterile gowns, sterile gloves, sterile drape, hand hygiene and skin antiseptic. A timeout was performed prior to the initiation of the procedure. Under sterile conditions and local anesthesia, ultrasound micropuncture needle access performed of the right internal jugular vein. Guidewire advanced centrally. Micro dilator set advanced. Amplatz guidewire advanced into the IVC. Tract dilatation performed to insert a 20 cm temporary dialysis catheter. Tip SVC RA junction. Images obtained for documentation. Catheter secured Prolene sutures. Blood aspirated easily followed by saline and heparin flushes. Appropriate volume strength of heparin instilled in all lumens followed by external caps and a sterile dressing. No immediate complication. Patient tolerated the procedure well. IMPRESSION: Successful ultrasound fluoroscopic right IJ temporary dialysis catheter. Access ready for use. Electronically Signed   By: Jerilynn Mages.  Shick M.D.   On: 01/06/2020 09:01   DG Chest Port 1 View  Result Date: 01/01/2020 CLINICAL DATA:  COVID-19 positive, hypoxia EXAM: PORTABLE CHEST 1 VIEW COMPARISON:  12/30/2019 FINDINGS: Single frontal view of the chest  demonstrates stable dual lead pacer. Cardiac silhouette is unremarkable. Multifocal bilateral airspace disease is unchanged. No effusion or pneumothorax. IMPRESSION: 1. Stable bilateral multifocal pneumonia. Electronically Signed   By: Randa Ngo M.D.   On: 01/01/2020 16:09   DG Chest Port 1 View  Result Date: 12/30/2019 CLINICAL DATA:  Pneumonia.  COVID. EXAM: PORTABLE CHEST 1 VIEW COMPARISON:  12/28/2019. FINDINGS: Cardiac pacer with lead tip over the right atrium right ventricle. Cardiomegaly. No pulmonary venous congestion. Bilateral pulmonary infiltrates, right side greater than left again noted without interim change. No pleural effusion or pneumothorax. IMPRESSION: 1. Cardiac pacer with lead tips over the right atrium right ventricle. Cardiomegaly. No pulmonary venous congestion. 2. Bilateral pulmonary infiltrates, right side greater than left again noted without interim change. Electronically Signed   By: Marcello Moores  Register   On: 12/30/2019 06:17   DG CHEST PORT 1 VIEW  Result Date: 12/28/2019 CLINICAL DATA:  Hypoxia EXAM: PORTABLE CHEST 1 VIEW COMPARISON:  December 22, 2019 FINDINGS: There is persistent airspace opacity in the right mid and lower lung regions as well as in the left base region. No new opacity evident. Heart is mildly enlarged with pacemaker leads attached to the right atrium and right ventricle.  No appreciable adenopathy. No bone lesions. No pneumothorax. IMPRESSION: Airspace opacity bilaterally, more on the right than on the left, essentially stable. Stable cardiac prominence with pacemaker leads present in the right atrium and right ventricle regions. Electronically Signed   By: Lowella Grip III M.D.   On: 12/28/2019 08:13   DG Chest Port 1 View  Result Date: 12/27/2019 CLINICAL DATA:  COVID, hypoxic EXAM: PORTABLE CHEST 1 VIEW COMPARISON:  05/16/2016 FINDINGS: Left pacer remains in place, unchanged. Cardiomegaly. Patchy airspace opacities throughout the right lung and in  the left base paddle with pneumonia. No effusions. No acute bony abnormality. IMPRESSION: Patchy bilateral airspace opacities, right greater than left compatible with pneumonia, likely COVID pneumonia. Electronically Signed   By: Rolm Baptise M.D.   On: 12/27/2019 21:51   DG Chest Port 1V same Day  Result Date: 01/06/2020 CLINICAL DATA:  Shortness of breath, COVID positive EXAM: PORTABLE CHEST 1 VIEW COMPARISON:  01/01/2020 FINDINGS: Interval placement of large bore right neck multi lumen vascular catheter, tip positioned near the superior cavoatrial junction. Interval increase in diffuse bilateral interstitial heterogeneous airspace opacity. Cardiomegaly with left chest multi lead pacer. IMPRESSION: 1. Interval placement of large bore right neck multi lumen vascular catheter, tip positioned near the superior cavoatrial junction. 2. Interval increase in diffuse bilateral interstitial heterogeneous airspace opacity, consistent with worsened COVID airspace disease. Electronically Signed   By: Eddie Candle M.D.   On: 01/06/2020 08:13   DG Abd Portable 2V  Result Date: 01/03/2020 CLINICAL DATA:  Nausea vomiting, weakness. EXAM: PORTABLE ABDOMEN - 2 VIEW COMPARISON:  01/01/2020 FINDINGS: There is a left chest wall pacer device with lead in the right atrial appendage and right ventricle. The bowel gas pattern appears nonobstructive. No dilated small bowel loops. Gas and stool noted within the colon. Degenerative disc disease identified within the lumbar spine. Mild bilateral hip osteoarthritis also noted. IMPRESSION: Nonobstructive bowel gas pattern. Electronically Signed   By: Kerby Moors M.D.   On: 01/03/2020 09:55   ECHOCARDIOGRAM COMPLETE  Result Date: 12/25/2019    ECHOCARDIOGRAM REPORT   Patient Name:   Kathryn Richardson Date of Exam: 12/25/2019 Medical Rec #:  [id number]      Height:       63.0 in Accession #:    [id number]     Weight:       197.4 lb Date of Birth:  [birth year]      BSA:          1.923 m  Patient Age:    19 years       BP:           108/58 mmHg Patient Gender: F              HR:           70 bpm. Exam Location:  Inpatient Procedure: 2D Echo and Strain Analysis Indications:    Elevated brain natriuretic peptide (BNP) level [[id number]]  History:        Patient has prior history of Echocardiogram examinations, most                 recent 04/22/2016. Pacemaker, Arrythmias:Atrial Fibrillation;                 Risk Factors:Hypertension, Diabetes and Dyslipidemia. Chronic                 renal disease stage IV, Covid Pneumonia, Acute hypoxic  respiratory failure.  Sonographer:    Darlina Sicilian RDCS Referring Phys: [phone] A CALDWELL POWELL [place]  1. Left ventricular ejection fraction, by estimation, is 50 to 55%. The left ventricle has low normal function. The left ventricle has no regional wall motion abnormalities. There is mild concentric left ventricular hypertrophy. Left ventricular diastolic function could not be evaluated. The average left ventricular global longitudinal strain is -7.1 %.  2. Right ventricular systolic function is mildly reduced. The right ventricular size is mildly enlarged. There is normal pulmonary artery systolic pressure. The estimated right ventricular systolic pressure is 33.2 mmHg.  3. Left atrial size was mildly dilated.  4. The mitral valve is degenerative. Mild mitral valve regurgitation. No evidence of mitral stenosis.  5. The aortic valve is tricuspid. Aortic valve regurgitation is not visualized. No aortic stenosis is present.  6. The inferior vena cava is normal in size with greater than 50% respiratory variability, suggesting right atrial pressure of 3 mmHg. Comparison(s): Prior images unable to be directly viewed, comparison made by report only. No significant change from prior study. FINDINGS  Left Ventricle: Left ventricular ejection fraction, by estimation, is 50 to 55%. The left ventricle has low normal function. The left ventricle has no  regional wall motion abnormalities. The average left ventricular global longitudinal strain is -7.1 %.  The left ventricular internal cavity size was normal in size. There is mild concentric left ventricular hypertrophy. Left ventricular diastolic function could not be evaluated due to atrial fibrillation. Left ventricular diastolic function could not be evaluated. Right Ventricle: The right ventricular size is mildly enlarged. No increase in right ventricular wall thickness. Right ventricular systolic function is mildly reduced. There is normal pulmonary artery systolic pressure. The tricuspid regurgitant velocity  is 2.27 m/s, and with an assumed right atrial pressure of 3 mmHg, the estimated right ventricular systolic pressure is 95.1 mmHg. Left Atrium: Left atrial size was mildly dilated. Right Atrium: Right atrial size was normal in size. Pericardium: Trivial pericardial effusion is present. Mitral Valve: The mitral valve is degenerative in appearance. Mild mitral annular calcification. Mild mitral valve regurgitation. No evidence of mitral valve stenosis. Tricuspid Valve: The tricuspid valve is grossly normal. Tricuspid valve regurgitation is mild . No evidence of tricuspid stenosis. Aortic Valve: The aortic valve is tricuspid. Aortic valve regurgitation is not visualized. No aortic stenosis is present. Pulmonic Valve: The pulmonic valve was grossly normal. Pulmonic valve regurgitation is mild. No evidence of pulmonic stenosis. Aorta: The aortic root and ascending aorta are structurally normal, with no evidence of dilitation. Venous: The inferior vena cava is normal in size with greater than 50% respiratory variability, suggesting right atrial pressure of 3 mmHg. IAS/Shunts: The atrial septum is grossly normal. Additional Comments: A pacer wire is visualized in the right atrium and right ventricle.   LV Volumes (MOD) LV vol d, MOD A2C: 88.6 ml LV vol d, MOD A4C: 85.2 ml 2D Longitudinal Strain LV vol s, MOD  A2C: 47.3 ml 2D Strain GLS Avg:     -7.1 % LV vol s, MOD A4C: 44.7 ml LV SV MOD A2C:     41.3 ml LV SV MOD A4C:     85.2 ml LV SV MOD BP:      41.7 ml RIGHT ATRIUM           Index RA Area:     15.30 cm RA Volume:   38.00 ml  19.76 ml/m  TRICUSPID VALVE TR Peak grad:   20.6 mmHg  TR Vmax:        227.00 cm/s Eleonore Chiquito MD Electronically signed by Eleonore Chiquito MD Signature Date/Time: 12/25/2019/10:32:44 AM    Final

## 2020-01-11 NOTE — Progress Notes (Signed)
[facility] NEPHROLOGY PROGRESS NOTE  Assessment/ Plan: Pt is a 85 y.o. yo female with history of DM, HTN, HLD, CKD admitted with COVID-19 pneumonia, hypoxia, AKI, transferred from [facility] to [facility] for initiation of dialysis.  #Acute kidney injury with azotemia, oligoanuric: Baseline creatinine level 1.2-2.2 follows at [facility].  Underlying CKD due to DM and hypertension.  AKI likely ATN due to Covid infection and hemodynamic instability.  Transferred from [facility] long hospital and initiate dialysis on 8/27.  Nontunneled right IJ HD catheter placed by IR on 8/26.   She had dialysis twice but unable to tolerate because of persistent hypotension despite of multiple measures including lowering dialysate temperature, IV albumin, midodrine.  She is hypotensive, on nonrebreather and has fluid overload.  I do not think she will tolerate dialysis mainly limited by hypotension.  Noted she is DNR, poor functional status therefore I do not recommend further dialysis. When I talked to the patient she does not want dialysis. I called her daughter and discussed about current clinical condition including not able to tolerate dialysis.  I discussed about stopping further dialysis and moving forward with hospice/comfort measures.  She agreed with the plan.  I have discussed this with primary team Dr. Candiss Norse.  #Acute hypoxic respiratory failure due to COVID-19 pneumonia: Treated by remdesivir, steroid and completed antibiotics.  Currently on high flow oxygen.  Per primary team.  #Anasarca/hypoalbuminemia: Treated with IV albumin however not able to UF because of hypotension.  #Hypotension/A. fib: Heart rate is controlled.  Discontinued diltiazem, metoprolol and increase midodrine but is still BP remains low.   #Metabolic acidosis: Received 2 HD with improvement.  #Anemia: Hemoglobin low 6.5 today due to epistaxis, heparin on hold.  Receiving blood transfusion.    #Deconditioning/debility: Poor  functional status.  Not doing well clinically.  As discussed above patient is DNR/DNI and now comfort measures.  Subjective: Seen and examined in bedside.  Event noted with epistaxis and receiving blood transfusion.  She was hypotensive this morning with systolic blood pressure around 70s.  Later BP improved with IV fluid.  [name].  She does not want dialysis further.  Nursing staff at bedside. Objective Vital signs in last 24 hours: Vitals:   2020-02-01 0500 01-Feb-2020 0800 2020-02-01 1000 February 01, 2020 1016  BP:  (!) 91/41  (!) 131/105  Pulse:  82  91  Resp:  (!) 21  (!) 22  Temp:  97.6 F (36.4 C) 97.6 F (36.4 C) 97.6 F (36.4 C)  TempSrc:  Skin Oral Oral  SpO2:  98%  96%  Weight: 101.6 kg     Height:       Weight change: -0.5 kg  Intake/Output Summary (Last 24 hours) at Feb 01, 2020 1054 Last data filed at 01/07/2020 1635 Gross per 24 hour  Intake 0 ml  Output 1620 ml  Net -1620 ml       Labs: Basic Metabolic Panel: Recent Labs  Lab 01/06/20 0726 01/07/20 0118 2020-02-01 0656  NA 136 135 135  K 5.5* 4.7 5.1  CL 99 98 97*  CO2 23 26 23   GLUCOSE 106* 165* 157*  BUN 168* 95* 69*  CREATININE 4.23* 3.19* 3.06*  CALCIUM 8.0* 7.4* 7.3*   Liver Function Tests: Recent Labs  Lab 01/06/20 0726 01/07/20 0118 Feb 01, 2020 0656  AST 33 32 31  ALT 28 26 25   ALKPHOS 54 53 51  BILITOT 1.2 1.1 1.4*  PROT 5.1* 4.5* 4.4*  ALBUMIN 2.2* 1.9* 2.1*   No results for input(s): LIPASE, AMYLASE in the last 168 hours. No results for input(s): AMMONIA in the last 168 hours. CBC: Recent Labs  Lab 01/04/20 0358 01/04/20 0358 01/05/20 0352 01/05/20 0352 01/06/20 0726 01/07/20 0118 January 26, 2020 0656  WBC 32.3*   < > 29.2*   < > 23.5* 23.7* 23.0*  HGB 13.2   < > 11.4*   < > 10.8* 9.6* 6.5*  HCT 39.7   < > 34.0*   < > 32.6* 28.2* 19.8*  MCV 95.2  --  95.2  --  93.9 94.3 97.1  PLT 165   < > 124*   < > PLATELET CLUMPS NOTED ON SMEAR, COUNT APPEARS  DECREASED 84* 73*   < > = values in this interval not displayed.   Cardiac Enzymes: No results for input(s): CKTOTAL, CKMB, CKMBINDEX, TROPONINI in the last 168 hours. CBG: Recent Labs  Lab 01/07/20 0728 01/07/20 1136 01/07/20 1704 01/07/20 2116 01-26-2020 0807  GLUCAP 135* 137* 99 143* 133*    Iron Studies:  Recent Labs    01-26-20 0824  IRON 56  TIBC 141*  FERRITIN 575*   Studies/Results: No results found.  Medications: Infusions: . sodium chloride 10 mL/hr at 12/31/19 0236    Scheduled Medications: . sodium chloride   Intravenous Once  . atorvastatin  80 mg Oral QHS  . benzonatate  100 mg Oral TID  . Chlorhexidine Gluconate Cloth  6 each Topical Daily  . feeding supplement (ENSURE ENLIVE)  237 mL Oral TID BM  . insulin aspart  0-20 Units Subcutaneous TID WC  . insulin aspart  0-5 Units Subcutaneous QHS  . insulin glargine  15 Units Subcutaneous Daily  . levothyroxine  50 mcg Oral Daily  . linagliptin  5 mg Oral Daily  . methylPREDNISolone (SOLU-MEDROL) injection  40 mg Intravenous Q12H  . midodrine  10 mg Oral TID WC  . pantoprazole  40 mg Oral Q1200  . polyethylene glycol  17 g Oral Daily  . sodium bicarbonate  650 mg Oral TID    have reviewed scheduled and prn medications.  Physical Exam: General: Critically ill looking female lying on bed,  Heart:RRR, s1s2 nl, no rub Lungs: Bibasal rhonchi and crackles Abdomen:soft, Non-tender, non-distended Extremities: Anasarca Dialysis Access: Right IJ temporary HD catheter. Neurology: Alert, awake, oriented to hospital, August and president.  Kathryn Richardson 01-26-20,10:54 AM  LOS: 16 days  Pager: [phone]

## 2020-01-11 NOTE — Progress Notes (Signed)
   01/19/2020 0800  Assess: MEWS Score  Temp 97.6 F (36.4 C)  BP (!) 91/41  Pulse Rate 82  ECG Heart Rate 82  Resp (!) 21  Level of Consciousness Alert  SpO2 98 %  O2 Device Non-rebreather Mask  O2 Flow Rate (L/min) 15 L/min  Assess: MEWS Score  MEWS Temp 0  MEWS Systolic 1  MEWS Pulse 0  MEWS RR 1  MEWS LOC 0  MEWS Score 2  MEWS Score Color Yellow  Assess: if the MEWS score is Yellow or Red  Were vital signs taken at a resting state? Yes  Focused Assessment No change from prior assessment  Early Detection of Sepsis Score *See Row Information* Low  MEWS guidelines implemented *See Row Information* Yes  Treat  MEWS Interventions Administered scheduled meds/treatments;Escalated (See documentation below)  Pain Scale 0-10  Pain Score 0   Morning assessment patient axox4, but lethargic and decrease in blood pressure. Heparin gtt discontinued by overnight nurse and no s/s of bleeding, resulting hgb 6.5. Notified MD of decrease in blood pressure doctor ordered one unit of prbc and and recheck vitals. Will implement orders.

## 2020-01-11 DEATH — deceased

## 2020-02-10 ENCOUNTER — Encounter: Payer: Medicare Other | Admitting: Internal Medicine

## 2021-09-26 IMAGING — DX DG CHEST 1V PORT
1 series · 1 of 1 positions shown · non-contrast
Comparison: 12/30/2019

CLINICAL DATA: 30FSD-E1 positive, hypoxia

EXAM:
PORTABLE CHEST 1 VIEW

[chest ap]
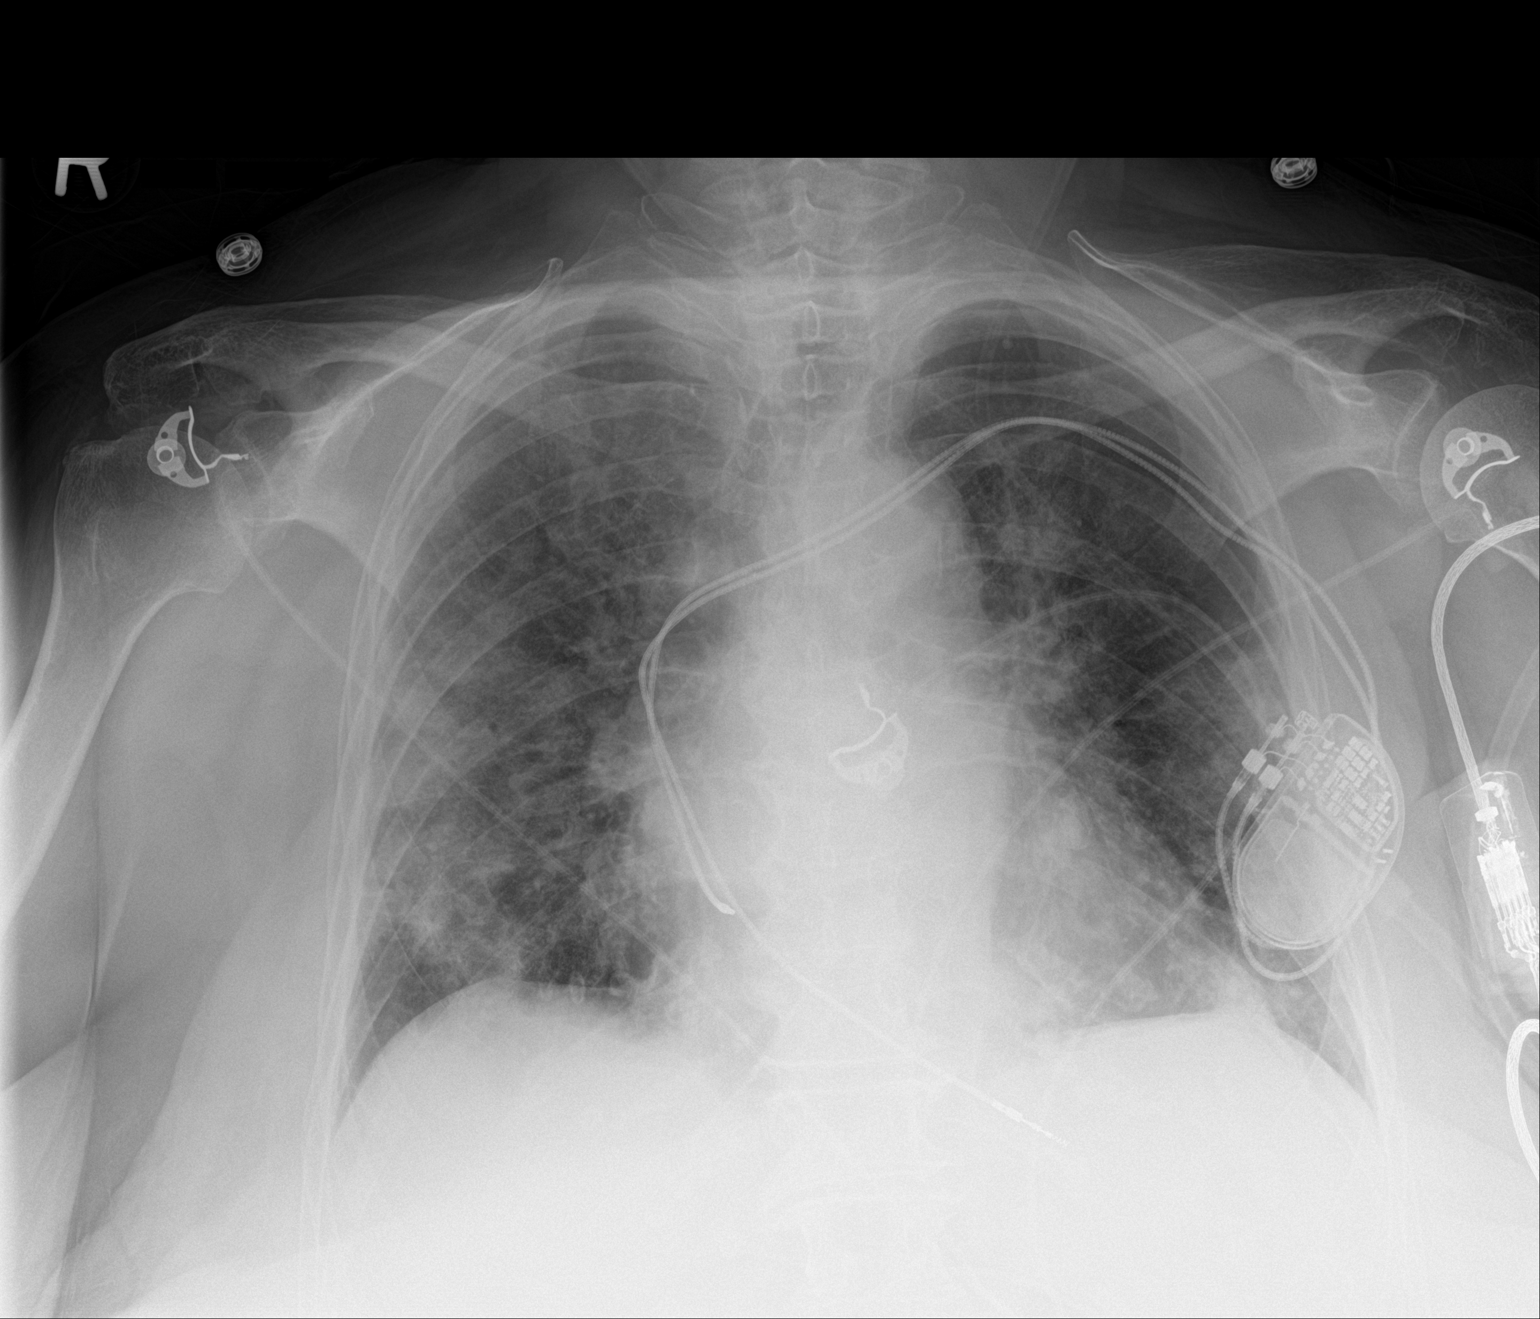

[1 of 1 positions shown; findings below may reference images not displayed]

FINDINGS: Single frontal view of the chest demonstrates stable dual lead
pacer. Cardiac silhouette is unremarkable. Multifocal bilateral
airspace disease is unchanged. No effusion or pneumothorax.
IMPRESSION: 1. Stable bilateral multifocal pneumonia.

## 2021-09-28 IMAGING — DX DG ABD PORTABLE 2V
3 series · 4 of 4 positions shown · non-contrast
Comparison: 01/01/2020

CLINICAL DATA: Nausea vomiting, weakness.

EXAM:
PORTABLE ABDOMEN - 2 VIEW

[Series 1: abdomen kub · 0.14mm/px · 2 of 2 slices shown (1 of 3)]
[im 1/2]
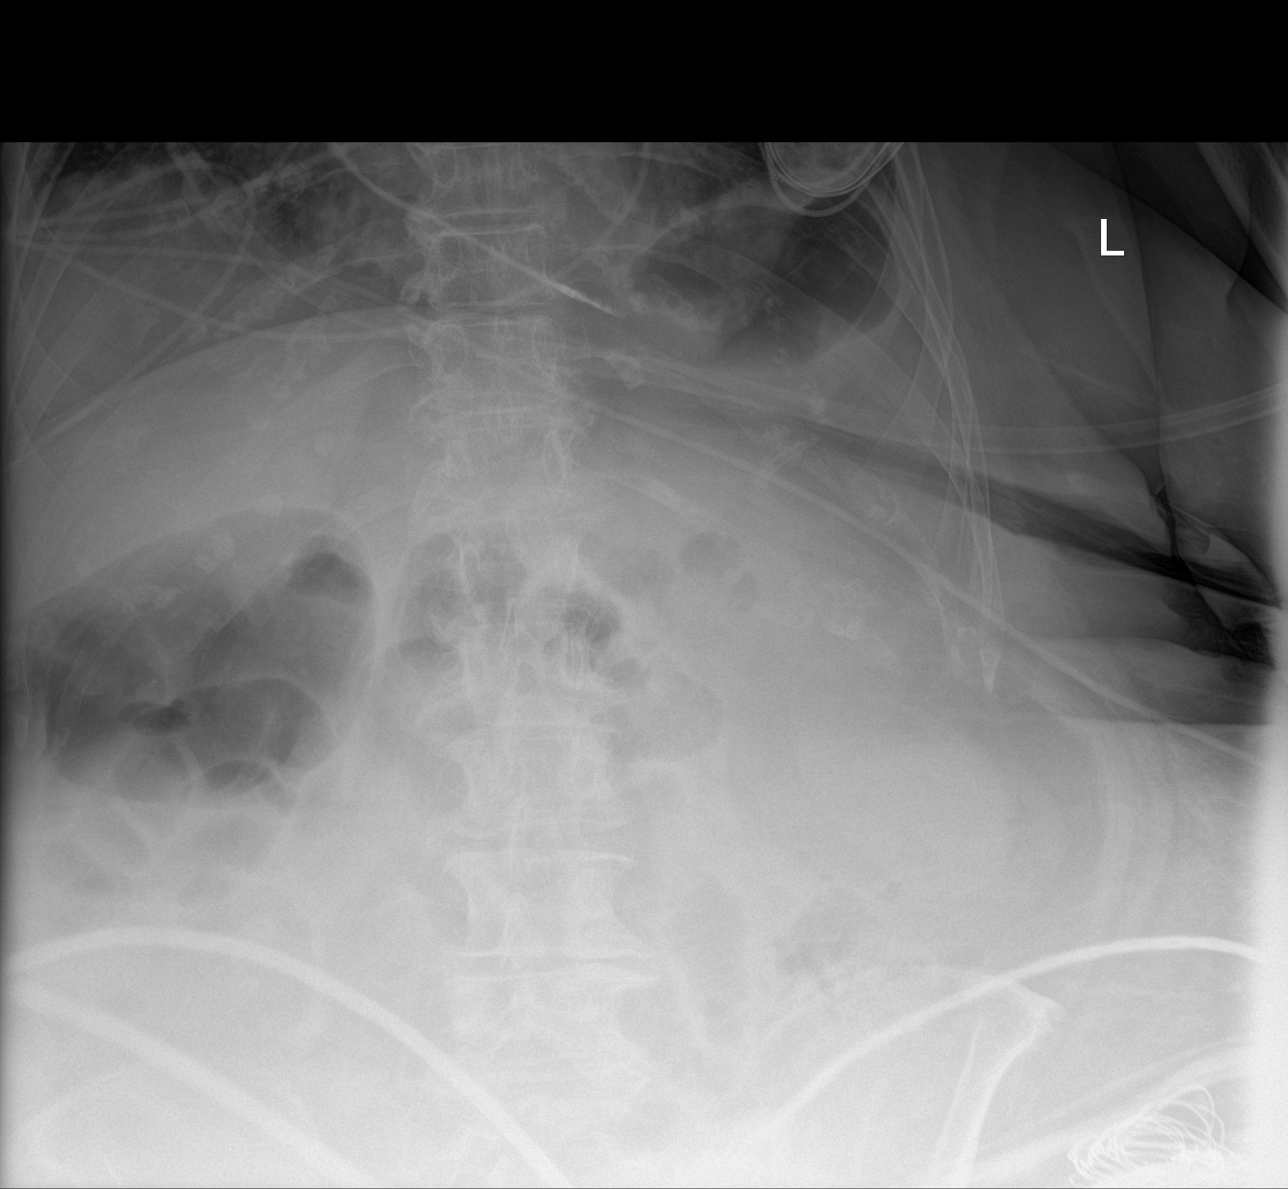
[im 2/2]
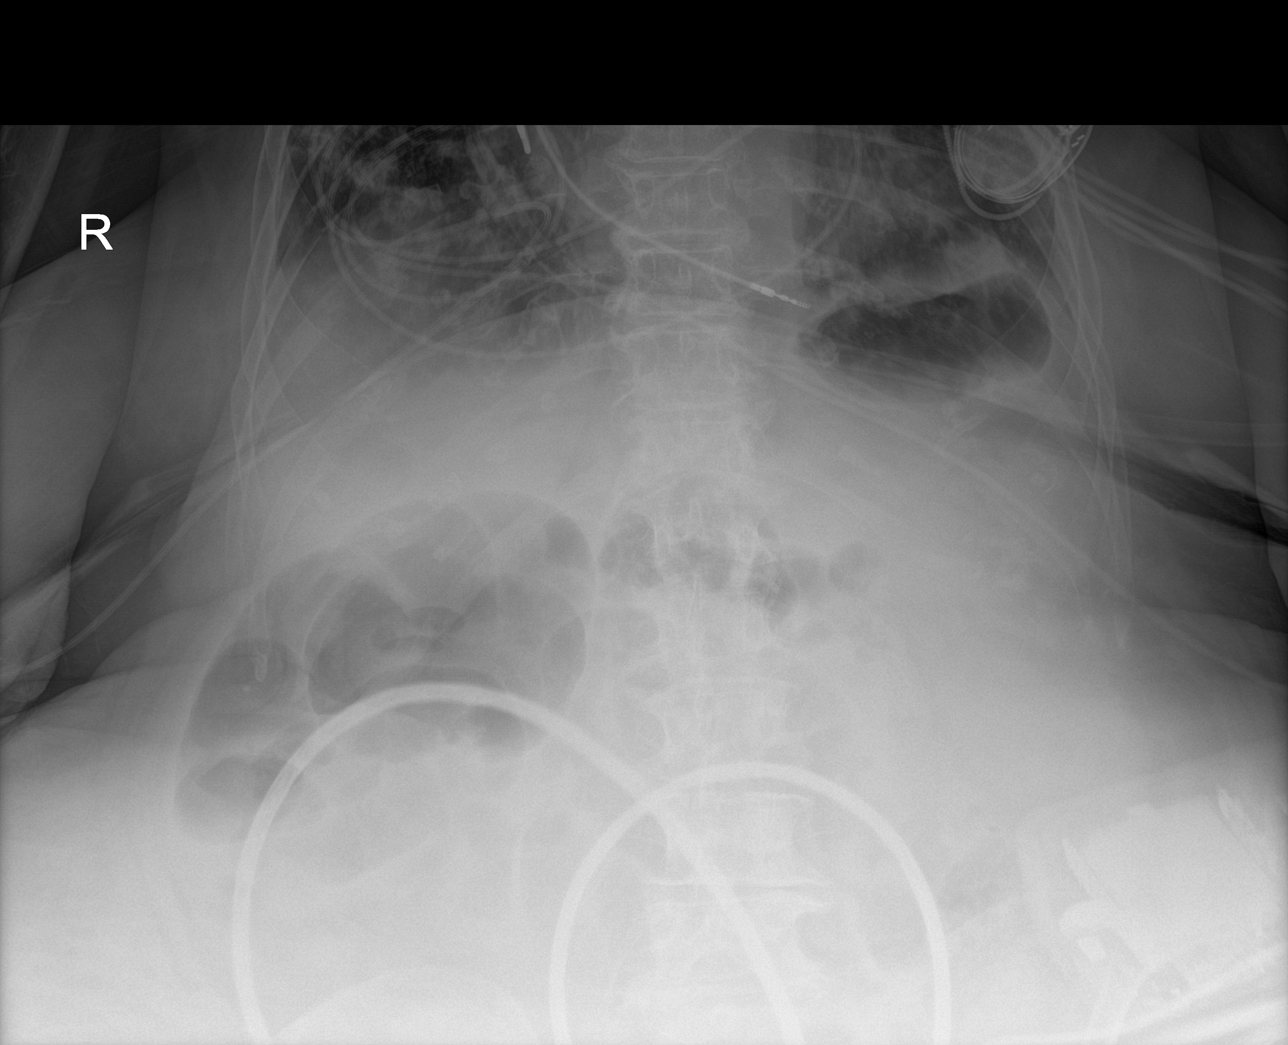

[abdomen kub (2 of 3)]
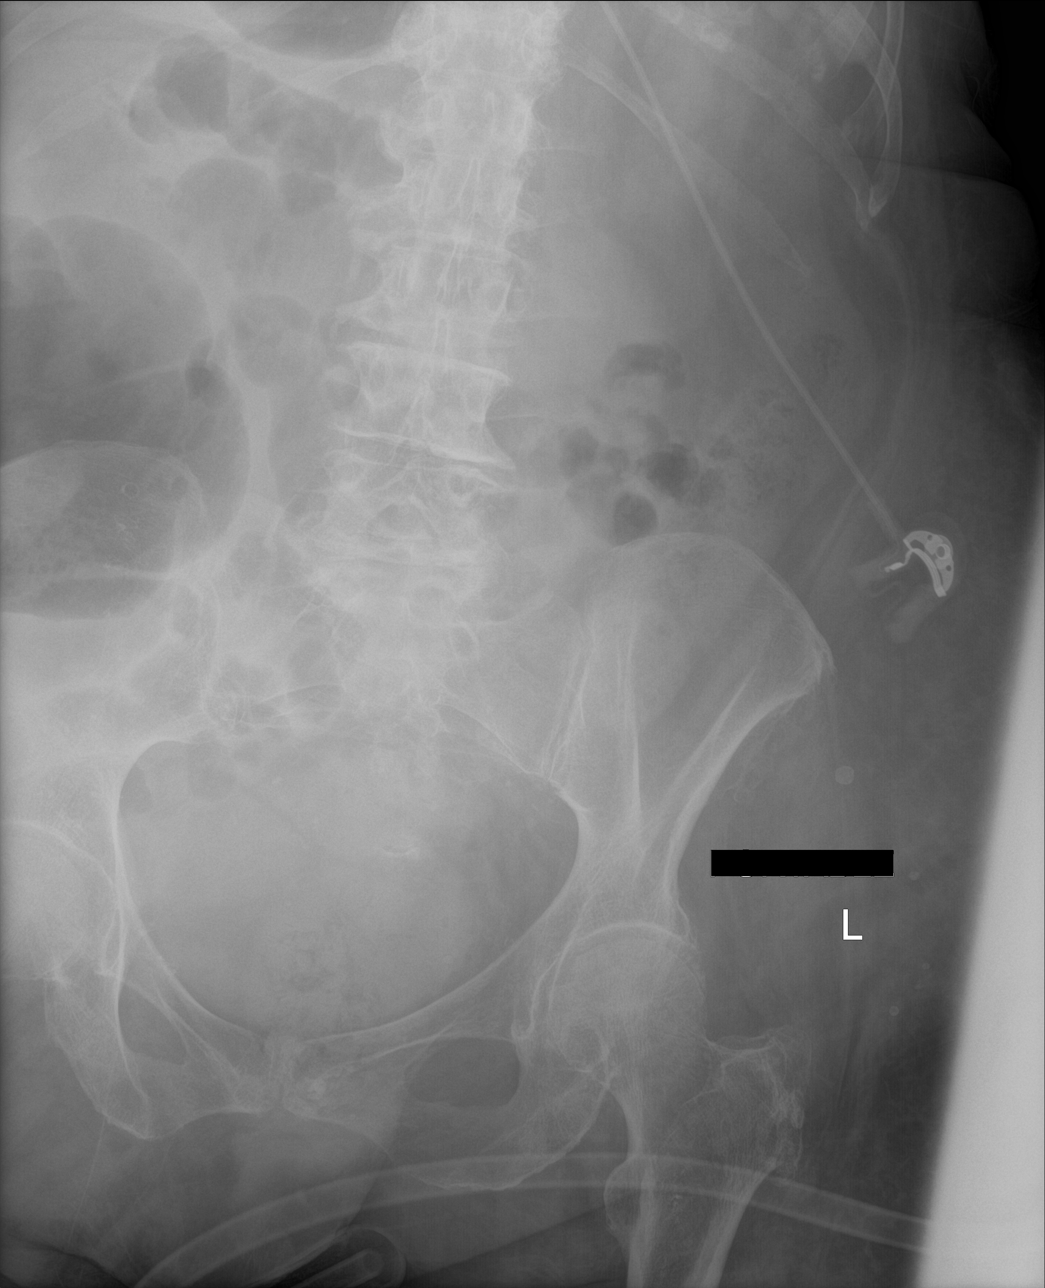

[abdomen kub (3 of 3)]
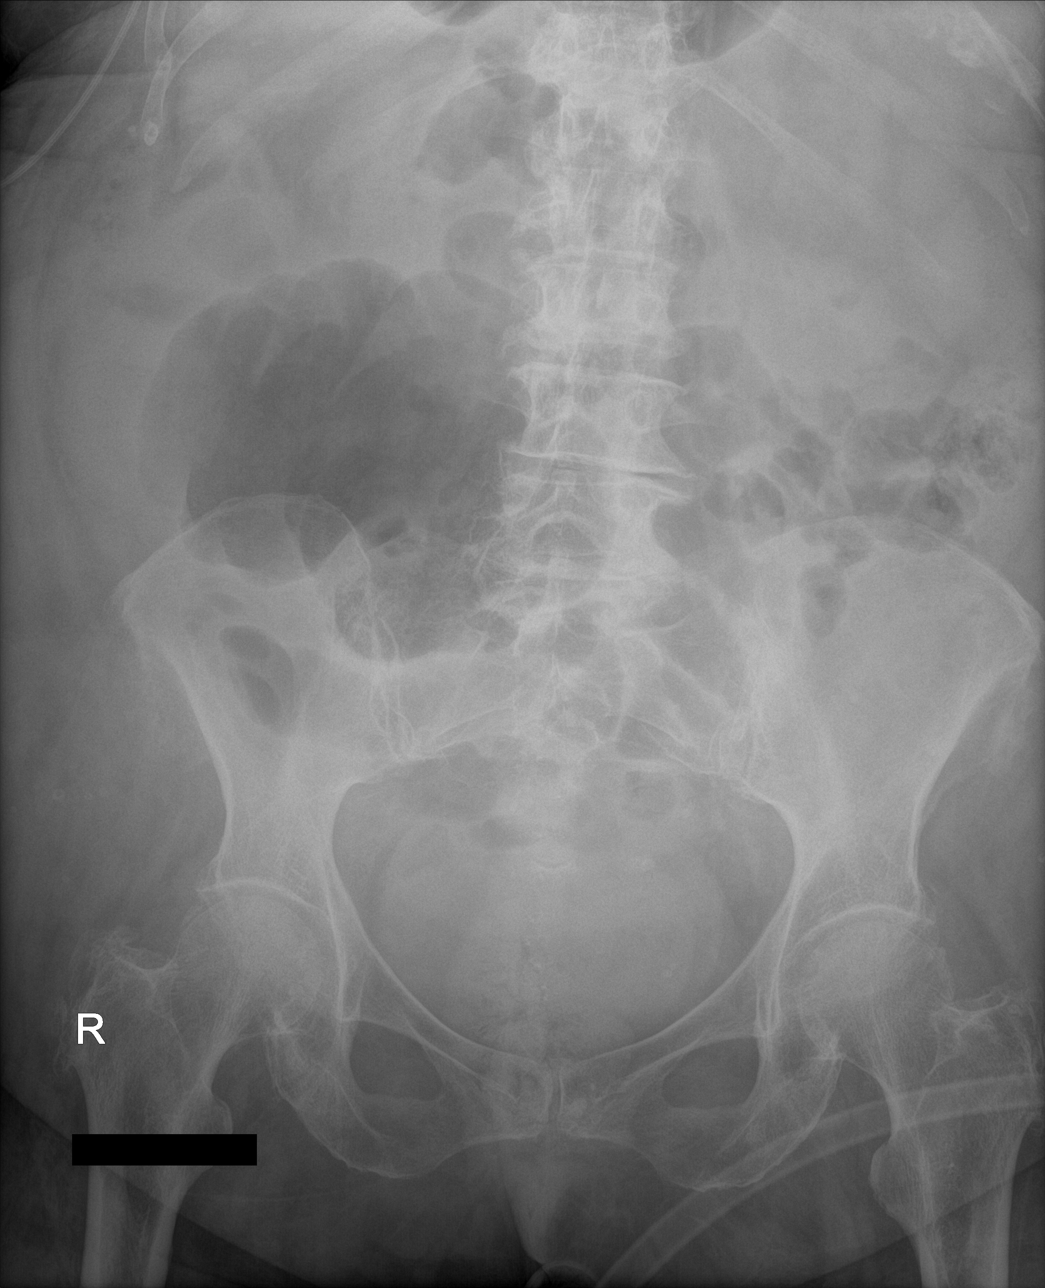

[4 of 4 positions shown; findings below may reference images not displayed]

FINDINGS: There is a left chest wall pacer device with lead in the right
atrial appendage and right ventricle. The bowel gas pattern appears
nonobstructive. No dilated small bowel loops. Gas and stool noted
within the colon. Degenerative disc disease identified within the
lumbar spine. Mild bilateral hip osteoarthritis also noted.
IMPRESSION: Nonobstructive bowel gas pattern.
